# Patient Record
Sex: Female | Born: 1975 | Hispanic: No | Marital: Married | Smoking: Never smoker
Health system: Southern US, Community
[De-identification: ages and names within clinical notes are randomized; demographics above are authoritative.]

## PROBLEM LIST (undated history)

## (undated) ENCOUNTER — Inpatient Hospital Stay (HOSPITAL_COMMUNITY): Payer: Self-pay

## (undated) DIAGNOSIS — R12 Heartburn: Secondary | ICD-10-CM

## (undated) DIAGNOSIS — O3430 Maternal care for cervical incompetence, unspecified trimester: Secondary | ICD-10-CM

## (undated) DIAGNOSIS — Z9221 Personal history of antineoplastic chemotherapy: Secondary | ICD-10-CM

## (undated) DIAGNOSIS — R519 Headache, unspecified: Secondary | ICD-10-CM

## (undated) DIAGNOSIS — O26899 Other specified pregnancy related conditions, unspecified trimester: Secondary | ICD-10-CM

## (undated) DIAGNOSIS — Z923 Personal history of irradiation: Secondary | ICD-10-CM

## (undated) DIAGNOSIS — Z8489 Family history of other specified conditions: Secondary | ICD-10-CM

## (undated) DIAGNOSIS — O2340 Unspecified infection of urinary tract in pregnancy, unspecified trimester: Secondary | ICD-10-CM

## (undated) DIAGNOSIS — O021 Missed abortion: Secondary | ICD-10-CM

## (undated) DIAGNOSIS — E119 Type 2 diabetes mellitus without complications: Secondary | ICD-10-CM

## (undated) DIAGNOSIS — O24419 Gestational diabetes mellitus in pregnancy, unspecified control: Secondary | ICD-10-CM

## (undated) DIAGNOSIS — R51 Headache: Secondary | ICD-10-CM

## (undated) DIAGNOSIS — Z808 Family history of malignant neoplasm of other organs or systems: Secondary | ICD-10-CM

## (undated) HISTORY — DX: Family history of other specified conditions: Z84.89

## (undated) HISTORY — PX: TISSUE EXPANDER REMOVAL: SHX2531

## (undated) HISTORY — DX: Family history of malignant neoplasm of other organs or systems: Z80.8

---

## 1999-12-11 HISTORY — PX: WISDOM TOOTH EXTRACTION: SHX21

## 2004-12-10 HISTORY — PX: APPENDECTOMY: SHX54

## 2010-07-24 ENCOUNTER — Ambulatory Visit (HOSPITAL_COMMUNITY): Admission: RE | Admit: 2010-07-24 | Discharge: 2010-07-24 | Payer: Self-pay | Admitting: Obstetrics & Gynecology

## 2010-08-01 ENCOUNTER — Inpatient Hospital Stay (HOSPITAL_COMMUNITY): Admission: AD | Admit: 2010-08-01 | Discharge: 2010-08-05 | Payer: Self-pay | Admitting: Obstetrics and Gynecology

## 2010-08-05 ENCOUNTER — Encounter (INDEPENDENT_AMBULATORY_CARE_PROVIDER_SITE_OTHER): Payer: Self-pay | Admitting: Obstetrics & Gynecology

## 2010-12-31 ENCOUNTER — Encounter: Payer: Self-pay | Admitting: Obstetrics & Gynecology

## 2011-01-25 ENCOUNTER — Other Ambulatory Visit: Payer: Self-pay | Admitting: Obstetrics & Gynecology

## 2011-02-08 HISTORY — PX: CERVICAL CERCLAGE: SHX1329

## 2011-02-15 ENCOUNTER — Ambulatory Visit (HOSPITAL_COMMUNITY)
Admission: RE | Admit: 2011-02-15 | Discharge: 2011-02-15 | Disposition: A | Discharge status: Home / Self Care | Payer: 59 | Source: Ambulatory Visit | Attending: Obstetrics & Gynecology | Admitting: Obstetrics & Gynecology

## 2011-02-15 DIAGNOSIS — O3430 Maternal care for cervical incompetence, unspecified trimester: Secondary | ICD-10-CM | POA: Insufficient documentation

## 2011-02-15 LAB — CBC
MCH: 27.4 pg (ref 26.0–34.0)
Platelets: 211 10*3/uL (ref 150–400)
RDW: 15 % (ref 11.5–15.5)
WBC: 7 10*3/uL (ref 4.0–10.5)

## 2011-02-15 LAB — TYPE AND SCREEN
ABO/RH(D): B POS
Antibody Screen: NEGATIVE

## 2011-02-21 NOTE — Op Note (Signed)
  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  PROCEDURE:  Modified McDonald cervical cerclage.  SURGEON:  Genia Del, MD  No assistant.  ANESTHESIOLOGIST:  Brayton Caves, MD  DESCRIPTION OF PROCEDURE:  Under spinal anesthesia, the patient is in lithotomy position.  She is prepped with Betadine on the suprapubic, vulvar, and vaginal areas.  The bladder is catheterized and the patient is draped as usual.  The vaginal exam reveals a closed, long, firm cervix.  Vaginally, it measures 2 cm in length, but by ultrasound, it was at 3.7 cm.  No vaginal bleeding, no abnormal vaginal discharge.  We put the weighted speculum in place in the vagina.  We used a retractor anteriorly and grasped the cervix with a fenestrated open clamp.  We started at 12 o'clock with a Mersilene band.  We go anticlockwise at the junction of the cervix and vagina staying superficially as possible.  We go in and out about 6 times to go all around the cervix.  We attach the knot at 12 o'clock and passed a Prolene stitch on the Mersilene for easier removal hopefully at 37 weeks.  We attached the Mersilene band with 4 flat knots.  Hemostasis is adequate.  All instruments are removed.  The estimated blood loss was minimal.  The patient received Ancef 1 gram IV before starting the procedure.  No complications occurred, and she was brought to recovery room in good stable  status. She will receive Endocet for 24 hours after the procedure.     Genia Del, M.D.     ML/MEDQ  D:  02/15/2011  T:  02/15/2011  Job:  [id number]  Electronically Signed by Genia Del M.D. on 02/21/2011 04:54:09 PM

## 2011-02-23 LAB — URINALYSIS, MICROSCOPIC ONLY
Bilirubin Urine: NEGATIVE
Glucose, UA: NEGATIVE mg/dL
Specific Gravity, Urine: 1.01 (ref 1.005–1.030)
pH: 7 (ref 5.0–8.0)

## 2011-02-23 LAB — URINALYSIS, ROUTINE W REFLEX MICROSCOPIC
Glucose, UA: NEGATIVE mg/dL
Ketones, ur: 15 mg/dL — AB
Nitrite: NEGATIVE

## 2011-02-23 LAB — CBC
HCT: 29.6 % — ABNORMAL LOW (ref 36.0–46.0)
Hemoglobin: 10.4 g/dL — ABNORMAL LOW (ref 12.0–15.0)
Hemoglobin: 10.8 g/dL — ABNORMAL LOW (ref 12.0–15.0)
MCH: 24.5 pg — ABNORMAL LOW (ref 26.0–34.0)
MCH: 24.9 pg — ABNORMAL LOW (ref 26.0–34.0)
MCH: 25 pg — ABNORMAL LOW (ref 26.0–34.0)
MCHC: 33.7 g/dL (ref 30.0–36.0)
MCHC: 33.7 g/dL (ref 30.0–36.0)
MCHC: 33.9 g/dL (ref 30.0–36.0)
MCV: 73.8 fL — ABNORMAL LOW (ref 78.0–100.0)
Platelets: 226 10*3/uL (ref 150–400)
Platelets: 267 10*3/uL (ref 150–400)
RBC: 4.41 MIL/uL (ref 3.87–5.11)
RBC: 4.45 MIL/uL (ref 3.87–5.11)
RDW: 16.5 % — ABNORMAL HIGH (ref 11.5–15.5)
RDW: 16.8 % — ABNORMAL HIGH (ref 11.5–15.5)
RDW: 16.9 % — ABNORMAL HIGH (ref 11.5–15.5)
WBC: 12.7 10*3/uL — ABNORMAL HIGH (ref 4.0–10.5)
WBC: 8.7 10*3/uL (ref 4.0–10.5)
WBC: 9.4 10*3/uL (ref 4.0–10.5)

## 2011-02-23 LAB — GLUCOSE, CAPILLARY
Glucose-Capillary: 104 mg/dL — ABNORMAL HIGH (ref 70–99)
Glucose-Capillary: 116 mg/dL — ABNORMAL HIGH (ref 70–99)
Glucose-Capillary: 117 mg/dL — ABNORMAL HIGH (ref 70–99)
Glucose-Capillary: 172 mg/dL — ABNORMAL HIGH (ref 70–99)
Glucose-Capillary: 86 mg/dL (ref 70–99)
Glucose-Capillary: 91 mg/dL (ref 70–99)
Glucose-Capillary: 97 mg/dL (ref 70–99)
Glucose-Capillary: 99 mg/dL (ref 70–99)

## 2011-02-23 LAB — ABO/RH: ABO/RH(D): B POS

## 2011-02-23 LAB — TYPE AND SCREEN
ABO/RH(D): B POS
Antibody Screen: NEGATIVE

## 2011-02-23 LAB — URINE CULTURE: Special Requests: POSITIVE

## 2011-02-23 LAB — DIFFERENTIAL
Basophils Absolute: 0.1 10*3/uL (ref 0.0–0.1)
Eosinophils Absolute: 0 10*3/uL (ref 0.0–0.7)
Lymphocytes Relative: 14 % (ref 12–46)
Lymphs Abs: 1.7 10*3/uL (ref 0.7–4.0)
Neutrophils Relative %: 78 % — ABNORMAL HIGH (ref 43–77)

## 2011-02-28 ENCOUNTER — Inpatient Hospital Stay (HOSPITAL_COMMUNITY)
Admission: AD | Admit: 2011-02-28 | Discharge: 2011-02-28 | Disposition: A | Discharge status: Home / Self Care | Payer: 59 | Source: Ambulatory Visit | Attending: Obstetrics & Gynecology | Admitting: Obstetrics & Gynecology

## 2011-02-28 DIAGNOSIS — E860 Dehydration: Secondary | ICD-10-CM | POA: Insufficient documentation

## 2011-02-28 DIAGNOSIS — O99891 Other specified diseases and conditions complicating pregnancy: Secondary | ICD-10-CM | POA: Insufficient documentation

## 2011-02-28 DIAGNOSIS — G43909 Migraine, unspecified, not intractable, without status migrainosus: Secondary | ICD-10-CM | POA: Insufficient documentation

## 2011-02-28 DIAGNOSIS — O211 Hyperemesis gravidarum with metabolic disturbance: Secondary | ICD-10-CM | POA: Insufficient documentation

## 2011-02-28 LAB — COMPREHENSIVE METABOLIC PANEL
ALT: 11 U/L (ref 0–35)
AST: 20 U/L (ref 0–37)
Albumin: 3.8 g/dL (ref 3.5–5.2)
Alkaline Phosphatase: 26 U/L — ABNORMAL LOW (ref 39–117)
CO2: 26 mEq/L (ref 19–32)
Chloride: 103 mEq/L (ref 96–112)
Creatinine, Ser: 0.55 mg/dL (ref 0.4–1.2)
GFR calc Af Amer: >60 mL/min (ref >60)
GFR calc non Af Amer: >60 mL/min (ref >60)
Potassium: 3.7 mEq/L (ref 3.5–5.1)
Sodium: 138 mEq/L (ref 135–145)
Total Bilirubin: 0.4 mg/dL (ref 0.3–1.2)

## 2011-02-28 LAB — URINALYSIS, ROUTINE W REFLEX MICROSCOPIC
Glucose, UA: NEGATIVE mg/dL
Hgb urine dipstick: NEGATIVE
Protein, ur: NEGATIVE mg/dL
pH: 6.5 (ref 5.0–8.0)

## 2011-04-18 ENCOUNTER — Inpatient Hospital Stay (HOSPITAL_COMMUNITY)
Admission: AD | Admit: 2011-04-18 | Discharge: 2011-04-27 | DRG: 774 | Disposition: A | Discharge status: Home / Self Care | Payer: 59 | Source: Ambulatory Visit | Attending: Obstetrics & Gynecology | Admitting: Obstetrics & Gynecology

## 2011-04-18 DIAGNOSIS — E119 Type 2 diabetes mellitus without complications: Secondary | ICD-10-CM | POA: Diagnosis present

## 2011-04-18 DIAGNOSIS — O364XX0 Maternal care for intrauterine death, not applicable or unspecified: Secondary | ICD-10-CM | POA: Diagnosis not present

## 2011-04-18 DIAGNOSIS — O3430 Maternal care for cervical incompetence, unspecified trimester: Principal | ICD-10-CM | POA: Diagnosis present

## 2011-04-18 DIAGNOSIS — O2432 Unspecified pre-existing diabetes mellitus in childbirth: Secondary | ICD-10-CM | POA: Diagnosis present

## 2011-04-18 DIAGNOSIS — O4290 Premature rupture of membranes, unspecified as to length of time between rupture and onset of labor, unspecified weeks of gestation: Secondary | ICD-10-CM | POA: Diagnosis present

## 2011-04-19 LAB — GLUCOSE, CAPILLARY
Glucose-Capillary: 90 mg/dL (ref 70–99)
Glucose-Capillary: 131 mg/dL — ABNORMAL HIGH (ref 70–99)

## 2011-04-20 LAB — GLUCOSE, CAPILLARY
Glucose-Capillary: 123 mg/dL — ABNORMAL HIGH (ref 70–99)
Glucose-Capillary: 129 mg/dL — ABNORMAL HIGH (ref 70–99)

## 2011-04-20 LAB — URINE CULTURE

## 2011-04-21 LAB — GLUCOSE, CAPILLARY
Glucose-Capillary: 89 mg/dL (ref 70–99)
Glucose-Capillary: 129 mg/dL — ABNORMAL HIGH (ref 70–99)

## 2011-04-22 ENCOUNTER — Inpatient Hospital Stay (HOSPITAL_COMMUNITY): Payer: 59

## 2011-04-22 LAB — GLUCOSE, CAPILLARY
Glucose-Capillary: 98 mg/dL (ref 70–99)
Glucose-Capillary: 131 mg/dL — ABNORMAL HIGH (ref 70–99)

## 2011-04-23 ENCOUNTER — Ambulatory Visit (HOSPITAL_COMMUNITY): DRG: 781 | Payer: 59 | Attending: Obstetrics & Gynecology

## 2011-04-23 LAB — GLUCOSE, CAPILLARY
Glucose-Capillary: 101 mg/dL — ABNORMAL HIGH (ref 70–99)
Glucose-Capillary: 130 mg/dL — ABNORMAL HIGH (ref 70–99)
Glucose-Capillary: 125 mg/dL — ABNORMAL HIGH (ref 70–99)

## 2011-04-24 LAB — GLUCOSE, CAPILLARY
Glucose-Capillary: 107 mg/dL — ABNORMAL HIGH (ref 70–99)
Glucose-Capillary: 164 mg/dL — ABNORMAL HIGH (ref 70–99)

## 2011-04-25 LAB — GLUCOSE, CAPILLARY
Glucose-Capillary: 119 mg/dL — ABNORMAL HIGH (ref 70–99)
Glucose-Capillary: 177 mg/dL — ABNORMAL HIGH (ref 70–99)

## 2011-04-26 LAB — GLUCOSE, CAPILLARY
Glucose-Capillary: 118 mg/dL — ABNORMAL HIGH (ref 70–99)
Glucose-Capillary: 211 mg/dL — ABNORMAL HIGH (ref 70–99)

## 2011-04-27 ENCOUNTER — Other Ambulatory Visit: Payer: Self-pay | Admitting: Obstetrics & Gynecology

## 2011-04-27 ENCOUNTER — Inpatient Hospital Stay (HOSPITAL_COMMUNITY): Payer: 59

## 2011-04-27 LAB — DIFFERENTIAL
Basophils Absolute: 0 K/uL (ref 0.0–0.1)
Basophils Relative: 0 % (ref 0–1)
Eosinophils Absolute: 0.1 K/uL (ref 0.0–0.7)
Eosinophils Relative: 1 % (ref 0–5)
Lymphocytes Relative: 13 % (ref 12–46)
Lymphs Abs: 1.6 K/uL (ref 0.7–4.0)
Monocytes Absolute: 0.7 K/uL (ref 0.1–1.0)
Monocytes Relative: 6 % (ref 3–12)
Neutro Abs: 9.8 K/uL — ABNORMAL HIGH (ref 1.7–7.7)
Neutrophils Relative %: 80 % — ABNORMAL HIGH (ref 43–77)

## 2011-04-27 LAB — CBC
HCT: 35.9 % — ABNORMAL LOW (ref 36.0–46.0)
HCT: 30.8 % — ABNORMAL LOW (ref 36.0–46.0)
Hemoglobin: 12.3 g/dL (ref 12.0–15.0)
Hemoglobin: 10.4 g/dL — ABNORMAL LOW (ref 12.0–15.0)
MCH: 27.6 pg (ref 26.0–34.0)
MCH: 27.1 pg (ref 26.0–34.0)
MCHC: 34.3 g/dL (ref 30.0–36.0)
MCHC: 33.8 g/dL (ref 30.0–36.0)
MCV: 80.7 fL (ref 78.0–100.0)
MCV: 80.2 fL (ref 78.0–100.0)
Platelets: 190 K/uL (ref 150–400)
Platelets: 170 K/uL (ref 150–400)
RBC: 4.45 MIL/uL (ref 3.87–5.11)
RBC: 3.84 MIL/uL — ABNORMAL LOW (ref 3.87–5.11)
RDW: 13.7 % (ref 11.5–15.5)
RDW: 13.7 % (ref 11.5–15.5)
WBC: 12.2 K/uL — ABNORMAL HIGH (ref 4.0–10.5)
WBC: 12.1 K/uL — ABNORMAL HIGH (ref 4.0–10.5)

## 2011-04-27 LAB — PROTIME-INR
INR: 1 (ref 0.00–1.49)
Prothrombin Time: 13.4 s (ref 11.6–15.2)

## 2011-04-27 LAB — APTT: aPTT: 29 s (ref 24–37)

## 2011-05-11 DEATH — deceased

## 2011-10-18 ENCOUNTER — Ambulatory Visit: Payer: 59 | Admitting: Psychology

## 2011-10-23 ENCOUNTER — Ambulatory Visit (INDEPENDENT_AMBULATORY_CARE_PROVIDER_SITE_OTHER): Payer: 59 | Admitting: Psychology

## 2011-10-23 DIAGNOSIS — F4323 Adjustment disorder with mixed anxiety and depressed mood: Secondary | ICD-10-CM

## 2011-11-08 ENCOUNTER — Ambulatory Visit (INDEPENDENT_AMBULATORY_CARE_PROVIDER_SITE_OTHER): Payer: 59 | Admitting: Psychology

## 2011-11-08 DIAGNOSIS — F331 Major depressive disorder, recurrent, moderate: Secondary | ICD-10-CM

## 2011-12-20 ENCOUNTER — Ambulatory Visit: Payer: Self-pay | Admitting: Psychology

## 2013-02-20 ENCOUNTER — Other Ambulatory Visit (HOSPITAL_COMMUNITY): Payer: Self-pay | Admitting: Obstetrics and Gynecology

## 2013-02-27 ENCOUNTER — Ambulatory Visit (HOSPITAL_COMMUNITY)
Admission: RE | Admit: 2013-02-27 | Discharge: 2013-02-27 | Disposition: A | Discharge status: Home / Self Care | Payer: 59 | Source: Ambulatory Visit | Attending: Obstetrics and Gynecology | Admitting: Obstetrics and Gynecology

## 2013-02-27 DIAGNOSIS — N979 Female infertility, unspecified: Secondary | ICD-10-CM | POA: Insufficient documentation

## 2013-02-27 MED ORDER — IOHEXOL 300 MG/ML  SOLN
20.0000 mL | Freq: Once | INTRAMUSCULAR | Status: AC | PRN
  Administered 2013-02-27: 30 mL

## 2013-05-27 ENCOUNTER — Ambulatory Visit: Payer: 59 | Attending: Physical Medicine and Rehabilitation

## 2013-05-27 DIAGNOSIS — M545 Low back pain, unspecified: Secondary | ICD-10-CM | POA: Insufficient documentation

## 2013-05-27 DIAGNOSIS — IMO0001 Reserved for inherently not codable concepts without codable children: Secondary | ICD-10-CM | POA: Insufficient documentation

## 2013-06-01 ENCOUNTER — Ambulatory Visit: Payer: 59 | Admitting: Physical Therapy

## 2013-06-05 ENCOUNTER — Ambulatory Visit: Payer: 59

## 2013-06-08 ENCOUNTER — Ambulatory Visit: Payer: 59

## 2013-06-19 ENCOUNTER — Ambulatory Visit: Payer: 59 | Attending: Physical Medicine and Rehabilitation

## 2013-06-19 DIAGNOSIS — IMO0001 Reserved for inherently not codable concepts without codable children: Secondary | ICD-10-CM | POA: Insufficient documentation

## 2013-06-19 DIAGNOSIS — M545 Low back pain, unspecified: Secondary | ICD-10-CM | POA: Insufficient documentation

## 2013-06-23 ENCOUNTER — Ambulatory Visit: Payer: 59

## 2013-06-25 ENCOUNTER — Ambulatory Visit: Payer: 59

## 2013-06-30 ENCOUNTER — Ambulatory Visit: Payer: 59

## 2013-07-02 ENCOUNTER — Ambulatory Visit: Payer: 59

## 2013-07-06 ENCOUNTER — Ambulatory Visit: Payer: 59 | Admitting: Physical Therapy

## 2013-07-09 ENCOUNTER — Ambulatory Visit: Payer: 59 | Admitting: Rehabilitation

## 2013-07-15 ENCOUNTER — Ambulatory Visit: Payer: 59 | Attending: Physical Medicine and Rehabilitation | Admitting: Physical Therapy

## 2013-07-15 DIAGNOSIS — IMO0001 Reserved for inherently not codable concepts without codable children: Secondary | ICD-10-CM | POA: Insufficient documentation

## 2013-07-15 DIAGNOSIS — M545 Low back pain, unspecified: Secondary | ICD-10-CM | POA: Insufficient documentation

## 2013-07-17 ENCOUNTER — Ambulatory Visit: Payer: 59

## 2013-07-22 ENCOUNTER — Ambulatory Visit: Payer: 59 | Admitting: Physical Therapy

## 2013-07-27 ENCOUNTER — Ambulatory Visit: Payer: 59 | Admitting: Physical Therapy

## 2013-07-29 ENCOUNTER — Encounter: Payer: Self-pay | Admitting: Physical Therapy

## 2013-07-29 ENCOUNTER — Ambulatory Visit: Payer: 59 | Admitting: Rehabilitation

## 2013-08-05 ENCOUNTER — Ambulatory Visit: Payer: 59 | Admitting: Physical Therapy

## 2015-03-11 DIAGNOSIS — O2340 Unspecified infection of urinary tract in pregnancy, unspecified trimester: Secondary | ICD-10-CM

## 2015-03-11 HISTORY — DX: Unspecified infection of urinary tract in pregnancy, unspecified trimester: O23.40

## 2015-03-23 LAB — OB RESULTS CONSOLE HEPATITIS B SURFACE ANTIGEN: Hepatitis B Surface Ag: NEGATIVE

## 2015-03-23 LAB — OB RESULTS CONSOLE ABO/RH: RH Type: POSITIVE

## 2015-03-23 LAB — OB RESULTS CONSOLE RPR: RPR: NONREACTIVE

## 2015-03-23 LAB — OB RESULTS CONSOLE HIV ANTIBODY (ROUTINE TESTING): HIV: NONREACTIVE

## 2015-03-23 LAB — OB RESULTS CONSOLE RUBELLA ANTIBODY, IGM: Rubella: IMMUNE

## 2015-04-10 DIAGNOSIS — O3430 Maternal care for cervical incompetence, unspecified trimester: Secondary | ICD-10-CM

## 2015-04-10 HISTORY — DX: Maternal care for cervical incompetence, unspecified trimester: O34.30

## 2015-04-12 NOTE — Patient Instructions (Addendum)
   Your procedure is scheduled on:  Friday, May 6  Enter through the Main Entrance of [facility] at: 9 AM Pick up the phone at the desk and dial [phone] and inform us of your arrival.  Please call this number if you have any problems the morning of surgery: [phone]  Remember: Do not eat or drink  after midnight:Thursday Take these medicines the morning of surgery with a SIP OF WATER:  Pepcid.  Patient instructed to withhold Metformin night dose tonight and Friday morning dose.  We will check you sugar upon arrival to Short Stay Dept..    Do not wear jewelry, make-up, or FINGER nail polish No metal in your hair or on your body. Do not wear lotions, powders, perfumes.  You may wear deodorant.  Do not bring valuables to the hospital. Contacts, dentures or bridgework may not be worn into surgery.  Patients discharged on the day of surgery will not be allowed to drive home.  Home with husband Allison Reed cell [phone] or brother Allison Reed cell  [phone].

## 2015-04-13 NOTE — Anesthesia Preprocedure Evaluation (Addendum)
Anesthesia Evaluation  Patient identified by MRN, date of birth, ID band Patient awake    Reviewed: Allergy & Precautions, NPO status , Patient's Chart, lab work & pertinent test results, reviewed documented beta blocker date and time   Airway Mallampati: II   Neck ROM: Full    Dental  (+) Teeth Intact, Dental Advisory Given   Pulmonary  breath sounds clear to auscultation        Cardiovascular negative cardio ROS  Rhythm:Regular     Neuro/Psych  Headaches,    GI/Hepatic   Endo/Other  diabetes, Oral Hypoglycemic Agents  Renal/GU      Musculoskeletal   Abdominal (+)  Abdomen: soft.    Peds  Hematology   Anesthesia Other Findings   Reproductive/Obstetrics Hx multiple pregnancy loss                            Anesthesia Physical Anesthesia Plan  ASA: II  Anesthesia Plan: General   Post-op Pain Management:    Induction: Intravenous  Airway Management Planned: Oral ETT  Additional Equipment:   Intra-op Plan:   Post-operative Plan: Extubation in OR  Informed Consent: I have reviewed the patients History and Physical, chart, labs and discussed the procedure including the risks, benefits and alternatives for the proposed anesthesia with the patient or authorized representative who has indicated his/her understanding and acceptance.     Plan Discussed with:   Anesthesia Plan Comments:         Anesthesia Quick Evaluation

## 2015-04-14 ENCOUNTER — Encounter (HOSPITAL_COMMUNITY): Payer: Self-pay

## 2015-04-14 ENCOUNTER — Encounter (HOSPITAL_COMMUNITY)
Admission: RE | Admit: 2015-04-14 | Discharge: 2015-04-14 | Disposition: A | Discharge status: Home / Self Care | Payer: 59 | Source: Ambulatory Visit | Attending: Obstetrics and Gynecology | Admitting: Obstetrics and Gynecology

## 2015-04-14 DIAGNOSIS — O24311 Unspecified pre-existing diabetes mellitus in pregnancy, first trimester: Secondary | ICD-10-CM | POA: Diagnosis not present

## 2015-04-14 DIAGNOSIS — O3431 Maternal care for cervical incompetence, first trimester: Secondary | ICD-10-CM | POA: Diagnosis present

## 2015-04-14 DIAGNOSIS — O99311 Alcohol use complicating pregnancy, first trimester: Secondary | ICD-10-CM | POA: Diagnosis not present

## 2015-04-14 DIAGNOSIS — Z3A11 11 weeks gestation of pregnancy: Secondary | ICD-10-CM | POA: Diagnosis not present

## 2015-04-14 HISTORY — DX: Type 2 diabetes mellitus without complications: E11.9

## 2015-04-14 HISTORY — DX: Other specified pregnancy related conditions, unspecified trimester: O26.899

## 2015-04-14 HISTORY — DX: Headache: R51

## 2015-04-14 HISTORY — DX: Other specified pregnancy related conditions, unspecified trimester: R12

## 2015-04-14 HISTORY — DX: Unspecified infection of urinary tract in pregnancy, unspecified trimester: O23.40

## 2015-04-14 HISTORY — DX: Missed abortion: O02.1

## 2015-04-14 HISTORY — DX: Headache, unspecified: R51.9

## 2015-04-14 HISTORY — DX: Maternal care for cervical incompetence, unspecified trimester: O34.30

## 2015-04-14 LAB — CBC
HCT: 37.8 % (ref 36.0–46.0)
HEMOGLOBIN: 13.1 g/dL (ref 12.0–15.0)
MCH: 27.9 pg (ref 26.0–34.0)
MCHC: 34.7 g/dL (ref 30.0–36.0)
MCV: 80.6 fL (ref 78.0–100.0)
PLATELETS: 214 10*3/uL (ref 150–400)
RBC: 4.69 MIL/uL (ref 3.87–5.11)
RDW: 13.3 % (ref 11.5–15.5)
WBC: 8.3 10*3/uL (ref 4.0–10.5)

## 2015-04-14 LAB — BASIC METABOLIC PANEL
Anion gap: 9 (ref 5–15)
CALCIUM: 9.5 mg/dL (ref 8.9–10.3)
CHLORIDE: 102 mmol/L (ref 101–111)
CO2: 23 mmol/L (ref 22–32)
Creatinine, Ser: 0.53 mg/dL (ref 0.44–1.00)
GFR calc Af Amer: >60 mL/min (ref >60)
Glucose, Bld: 183 mg/dL — ABNORMAL HIGH (ref 70–99)
Potassium: 3.8 mmol/L (ref 3.5–5.1)
Sodium: 134 mmol/L — ABNORMAL LOW (ref 135–145)

## 2015-04-15 ENCOUNTER — Ambulatory Visit (HOSPITAL_COMMUNITY)
Admission: RE | Admit: 2015-04-15 | Discharge: 2015-04-15 | Disposition: A | Discharge status: Home / Self Care | Payer: 59 | Source: Ambulatory Visit | Attending: Obstetrics and Gynecology | Admitting: Obstetrics and Gynecology

## 2015-04-15 ENCOUNTER — Ambulatory Visit (HOSPITAL_COMMUNITY): Payer: 59 | Admitting: Anesthesiology

## 2015-04-15 ENCOUNTER — Encounter (HOSPITAL_COMMUNITY)
Admission: RE | Disposition: A | Discharge status: Home / Self Care | Payer: Self-pay | Source: Ambulatory Visit | Attending: Obstetrics and Gynecology

## 2015-04-15 ENCOUNTER — Other Ambulatory Visit (HOSPITAL_COMMUNITY): Payer: Self-pay | Admitting: Obstetrics and Gynecology

## 2015-04-15 ENCOUNTER — Encounter (HOSPITAL_COMMUNITY): Payer: Self-pay | Admitting: Anesthesiology

## 2015-04-15 DIAGNOSIS — O3431 Maternal care for cervical incompetence, first trimester: Secondary | ICD-10-CM | POA: Insufficient documentation

## 2015-04-15 DIAGNOSIS — O99311 Alcohol use complicating pregnancy, first trimester: Secondary | ICD-10-CM | POA: Insufficient documentation

## 2015-04-15 DIAGNOSIS — O26879 Cervical shortening, unspecified trimester: Secondary | ICD-10-CM

## 2015-04-15 DIAGNOSIS — O24311 Unspecified pre-existing diabetes mellitus in pregnancy, first trimester: Secondary | ICD-10-CM | POA: Insufficient documentation

## 2015-04-15 DIAGNOSIS — Z3A11 11 weeks gestation of pregnancy: Secondary | ICD-10-CM | POA: Insufficient documentation

## 2015-04-15 HISTORY — PX: CERCLAGE LAPAROSCOPIC ABDOMINAL: SHX5769

## 2015-04-15 LAB — GLUCOSE, CAPILLARY
Glucose-Capillary: 103 mg/dL — ABNORMAL HIGH (ref 70–99)
Glucose-Capillary: 165 mg/dL — ABNORMAL HIGH (ref 70–99)

## 2015-04-15 SURGERY — CERCLAGE, CERVIX, LAPAROSCOPIC
Anesthesia: General | Site: Abdomen

## 2015-04-15 MED ORDER — INDOMETHACIN 25 MG PO CAPS: 25.0000 mg | ORAL_CAPSULE | Freq: Three times a day (TID) | ORAL | Status: DC

## 2015-04-15 MED ORDER — NEOSTIGMINE METHYLSULFATE 10 MG/10ML IV SOLN
INTRAVENOUS | Status: AC
  Filled 2015-04-15: qty 1

## 2015-04-15 MED ORDER — NEOSTIGMINE METHYLSULFATE 10 MG/10ML IV SOLN
INTRAVENOUS | Status: DC | PRN
  Administered 2015-04-15: 4 mg via INTRAVENOUS

## 2015-04-15 MED ORDER — LIDOCAINE HCL (CARDIAC) 20 MG/ML IV SOLN
INTRAVENOUS | Status: AC
  Filled 2015-04-15: qty 5

## 2015-04-15 MED ORDER — OXYCODONE-ACETAMINOPHEN 7.5-325 MG PO TABS: 1.0000 | ORAL_TABLET | ORAL | Status: DC | PRN

## 2015-04-15 MED ORDER — CEFAZOLIN SODIUM-DEXTROSE 2-3 GM-% IV SOLR
2.0000 g | INTRAVENOUS | Status: AC
  Administered 2015-04-15: 2 g via INTRAVENOUS

## 2015-04-15 MED ORDER — FENTANYL CITRATE (PF) 100 MCG/2ML IJ SOLN
INTRAMUSCULAR | Status: DC | PRN
  Administered 2015-04-15 (×5): 50 ug via INTRAVENOUS
  Administered 2015-04-15: 100 ug via INTRAVENOUS

## 2015-04-15 MED ORDER — BUPIVACAINE HCL 0.25 % IJ SOLN
INTRAMUSCULAR | Status: DC | PRN
  Administered 2015-04-15: 20 mL

## 2015-04-15 MED ORDER — LACTATED RINGERS IR SOLN
Status: DC | PRN
  Administered 2015-04-15: 3000 mL

## 2015-04-15 MED ORDER — CEFAZOLIN SODIUM-DEXTROSE 2-3 GM-% IV SOLR
INTRAVENOUS | Status: AC
  Filled 2015-04-15: qty 50

## 2015-04-15 MED ORDER — FENTANYL CITRATE (PF) 100 MCG/2ML IJ SOLN
INTRAMUSCULAR | Status: AC
  Filled 2015-04-15: qty 2

## 2015-04-15 MED ORDER — INDOMETHACIN 50 MG RE SUPP
50.0000 mg | Freq: Once | RECTAL | Status: AC
  Administered 2015-04-15: 50 mg via RECTAL
  Filled 2015-04-15: qty 1

## 2015-04-15 MED ORDER — SUCCINYLCHOLINE CHLORIDE 20 MG/ML IJ SOLN
INTRAMUSCULAR | Status: DC | PRN
  Administered 2015-04-15: 140 mg via INTRAVENOUS

## 2015-04-15 MED ORDER — BUPIVACAINE HCL (PF) 0.25 % IJ SOLN
INTRAMUSCULAR | Status: AC
  Filled 2015-04-15: qty 30

## 2015-04-15 MED ORDER — PROPOFOL 10 MG/ML IV BOLUS
INTRAVENOUS | Status: AC
  Filled 2015-04-15: qty 20

## 2015-04-15 MED ORDER — LACTATED RINGERS IV SOLN
INTRAVENOUS | Status: DC
  Administered 2015-04-15 (×3): via INTRAVENOUS

## 2015-04-15 MED ORDER — PROPOFOL 10 MG/ML IV BOLUS
INTRAVENOUS | Status: DC | PRN
  Administered 2015-04-15: 160 mg via INTRAVENOUS

## 2015-04-15 MED ORDER — GLYCOPYRROLATE 0.2 MG/ML IJ SOLN
INTRAMUSCULAR | Status: AC
  Filled 2015-04-15: qty 3

## 2015-04-15 MED ORDER — ROCURONIUM BROMIDE 100 MG/10ML IV SOLN
INTRAVENOUS | Status: DC | PRN
  Administered 2015-04-15 (×3): 10 mg via INTRAVENOUS
  Administered 2015-04-15: 30 mg via INTRAVENOUS

## 2015-04-15 MED ORDER — SCOPOLAMINE 1 MG/3DAYS TD PT72
MEDICATED_PATCH | TRANSDERMAL | Status: AC
  Filled 2015-04-15: qty 1

## 2015-04-15 MED ORDER — ROCURONIUM BROMIDE 100 MG/10ML IV SOLN
INTRAVENOUS | Status: AC
  Filled 2015-04-15: qty 1

## 2015-04-15 MED ORDER — LIDOCAINE HCL (CARDIAC) 20 MG/ML IV SOLN
INTRAVENOUS | Status: DC | PRN
  Administered 2015-04-15: 80 mg via INTRAVENOUS

## 2015-04-15 MED ORDER — FENTANYL CITRATE (PF) 250 MCG/5ML IJ SOLN
INTRAMUSCULAR | Status: AC
  Filled 2015-04-15: qty 5

## 2015-04-15 SURGICAL SUPPLY — 45 items
BENZOIN TINCTURE PRP APPL 2/3 (GAUZE/BANDAGES/DRESSINGS) ×3 IMPLANT
CABLE HIGH FREQUENCY MONO STRZ (ELECTRODE) ×3 IMPLANT
CATH ROBINSON RED A/P 16FR (CATHETERS) ×3 IMPLANT
CLOTH BEACON ORANGE TIMEOUT ST (SAFETY) ×3 IMPLANT
DEVICE TROCAR PUNCTURE CLOSURE (ENDOMECHANICALS) ×3 IMPLANT
DRSG COVADERM PLUS 2X2 (GAUZE/BANDAGES/DRESSINGS) ×6 IMPLANT
DRSG OPSITE POSTOP 3X4 (GAUZE/BANDAGES/DRESSINGS) ×3 IMPLANT
ELECT NEEDLE TIP 2.8 STRL (NEEDLE) IMPLANT
ELECT REM PT RETURN 9FT ADLT (ELECTROSURGICAL) ×3
ELECTRODE REM PT RTRN 9FT ADLT (ELECTROSURGICAL) ×1 IMPLANT
EVACUATOR SMOKE 8.L (FILTER) IMPLANT
GLOVE BIO SURGEON STRL SZ8 (GLOVE) ×3 IMPLANT
GLOVE BIOGEL PI IND STRL 8.5 (GLOVE) ×1 IMPLANT
GLOVE BIOGEL PI INDICATOR 8.5 (GLOVE) ×2
GOWN STRL REUS W/TWL LRG LVL3 (GOWN DISPOSABLE) ×6 IMPLANT
LIQUID BAND (GAUZE/BANDAGES/DRESSINGS) ×3 IMPLANT
PACK LAPAROSCOPY BASIN (CUSTOM PROCEDURE TRAY) ×3 IMPLANT
PAD POSITIONER PINK NONSTERILE (MISCELLANEOUS) ×3 IMPLANT
PENCIL BUTTON HOLSTER BLD 10FT (ELECTRODE) ×3 IMPLANT
RETRACTOR LAPSCP 12X46 CVD (ENDOMECHANICALS) ×2 IMPLANT
RTRCTR LAPSCP 12X46 CVD (ENDOMECHANICALS) ×6
SCALPEL HARMONIC ACE (MISCELLANEOUS) IMPLANT
SEALER TISSUE G2 CVD JAW 35 (ENDOMECHANICALS) IMPLANT
SEALER TISSUE G2 CVD JAW 45CM (ENDOMECHANICALS)
SEPRAFILM MEMBRANE 5X6 (MISCELLANEOUS) IMPLANT
SET IRRIG TUBING LAPAROSCOPIC (IRRIGATION / IRRIGATOR) ×3 IMPLANT
SHEARS HARMONIC ACE PLUS 36CM (ENDOMECHANICALS) ×3 IMPLANT
SLEEVE XCEL OPT CAN 5 100 (ENDOMECHANICALS) ×3 IMPLANT
SUT MERSILENE 5MM BP 1 12 (SUTURE) ×3 IMPLANT
SUT MNCRL AB 4-0 PS2 18 (SUTURE) ×3 IMPLANT
SUT PROLENE 0 CT 1 30 (SUTURE) IMPLANT
SUT SILK 2 0 FS (SUTURE) ×3 IMPLANT
SUT SILK 2 0 FSL 18 (SUTURE) ×6 IMPLANT
SUT VIC AB 2-0 UR6 27 (SUTURE) IMPLANT
SUT VICRYL 0 TIES 12 18 (SUTURE) IMPLANT
SYR 20CC LL (SYRINGE) ×6 IMPLANT
SYR 50ML LL SCALE MARK (SYRINGE) IMPLANT
SYR TOOMEY 50ML (SYRINGE) ×6 IMPLANT
TOWEL OR 17X24 6PK STRL BLUE (TOWEL DISPOSABLE) ×6 IMPLANT
TRAY FOLEY CATH SILVER 14FR (SET/KITS/TRAYS/PACK) ×3 IMPLANT
TROCAR OPTI TIP 5M 100M (ENDOMECHANICALS) ×3 IMPLANT
TROCAR XCEL 12X100 BLDLESS (ENDOMECHANICALS) ×3 IMPLANT
TROCAR XCEL DIL TIP R 11M (ENDOMECHANICALS) ×3 IMPLANT
WARMER LAPAROSCOPE (MISCELLANEOUS) ×3 IMPLANT
WATER STERILE IRR 1000ML POUR (IV SOLUTION) ×3 IMPLANT

## 2015-04-15 NOTE — Transfer of Care (Signed)
Immediate Anesthesia Transfer of Care Note  Patient: Allison Reed  Procedure(s) Performed: Procedure(s): LAPAROSCOPIC TRANSABDOMINAL Odell (N/A)  Patient Location: PACU  Anesthesia Type:General  Level of Consciousness: awake, oriented and patient cooperative  Airway & Oxygen Therapy: Patient Spontanous Breathing and Patient connected to nasal cannula oxygen  Post-op Assessment: Report given to RN and Post -op Vital signs reviewed and stable  Post vital signs: Reviewed and stable  Last Vitals:  Filed Vitals:   04/15/15 0904  BP: 107/74  Pulse: 85  Temp: 36.6 C  Resp: 18    Complications: No apparent anesthesia complications

## 2015-04-15 NOTE — Op Note (Signed)
OPERATIVE NOTE  Preoperative diagnosis: Cervical insufficiency, intrauterine pregnancy at 11 weeks Postoperative diagnosis: Cervical insufficiency, intrauterine pregnancy at 11 weeks Procedure: Laparoscopy, transabdominal cervical isthmic cerclage, intraoperative ultrasound guidance Anesthesia: Gen. Endotracheal Surgeon:Ayren Zumbro Kerin Perna, MD Assistant: Cala Bradford, MD Complications: None  Estimated blood loss: <20 cc Findings: On exam under anesthesia the cervix was closed. On laparoscopy the liver edges and gallbladder were normal. The uterus was gravid both tubes appeared normal, ovaries appeared polycystic. Intraoperative ultrasound transabdominally and transvaginally showed a singleton intrauterine pregnancy at length consistent with 11 weeks. There was fetal cardiac activity and movements.  Description of the procedure: The patient was placed in dorsal supine position and general endotracheal anesthesia was given. She was then placed in lithotomy position and the abdomen was prepped and draped inside manner. A Foley catheter was inserted into the bladder. An intraumbilical 5 mm vertical skin incision was made after preemptive anesthesia of all toes incisions with 0.25% bupivacaine. A Verress needle was inserted. A pneumoperitoneum was created with carbon dioxide. A 5 mm trocar and then a corresponding laparoscope with 30 angle was inserted and video laparoscopy was started.  Under direct visualization 2 more 5 mm lower quadrant incisions were made and corresponding trochars were placed. A fifth 12 mm trocar was placed in the left upper quadrant for the Endopaddle retractor.  Above findings were noted. A #5 Mersilene tape was prepared by cutting the needles off just at the swaged and and creating a loop of 2-0 silk at each end to allow carrying the end of the Mersilene tape through the tissue during the cerclage. This Mersilene tape was then dropped into the posterior cul-de-sac. The  patient was placed in Trendelenburg position and the uterus was gently pressed posteriorly and superiorly with the EndoPaddle retractor while the bladder flap was created with the electrosurgical needle, set at 48 W cutting current. After blunt and sharp dissection the cervico-isthmic junction could be well visualized.  Next a stab wound incision was made 1 cm from the midline immediately suprapubically and an Endo Close ligature carrier was passed through the abdominal wall and then through the right side of the cervico-isthmic junction. At this point of passing the tip of the Endo Close through the parametrium the uterus was hammocked on the EndoPaddle device and lifted anteriorly, allowing visualization of the posterior lower uterine segment. The tip of the Endo Close device was brought out posteriorly just above the medial insertion point of the right uterosacral ligament onto the uterus. one end of the Mersilene tape was grasped with the Endo Close device and brought out anteriorly. The same steps were repeated with a new Endo Close device on the left anterior aspect of the cervico-isthmic junction, immediately medial to the uterine blood vessels, and the other end of the Mersilene tape was grasped with the ligature carrier device and brought out anteriorly. Intraoperative transvaginal ultrasound guidance confirmed correct extra chorionic placement of the cerclage, as well as the fetal viability. When the cerclage suture was tied with a surgeon's knot followed by 4 additional knots under transvaginal ultrasound guidance again the correct placement was confirmed. The resulting cervical cerclage produced a cervical length of 4.4 cm. The AP and transverse diameters of the cerclage by ultrasound was 19 and 22 mm respectively. The ends of the Mersilene tape was trimmed off and removed from the pelvis. Good hemostasis was insured. The pelvis was copiously irrigated and aspirated. The gas was allowed to escape. A 0  Vicryl deep subcutaneous suture was placed  on the 12 mm incision. All skin incisions were approximated with 4-0 Monocryl in subcuticular stitches. The patient tolerated the procedure well and was transferred to recovery room in satisfactory condition.  Governor Specking

## 2015-04-15 NOTE — Anesthesia Postprocedure Evaluation (Signed)
  Anesthesia Post-op Note  Patient: Allison Reed  Procedure(s) Performed: Procedure(s): LAPAROSCOPIC TRANSABDOMINAL CERVCOISTHMIC CERCLAGE (N/A)  Patient Location: PACU  Anesthesia Type:General  Level of Consciousness: awake and alert   Airway and Oxygen Therapy: Patient Spontanous Breathing and Patient connected to nasal cannula oxygen  Post-op Pain: none  Post-op Assessment: Post-op Vital signs reviewed, Patient's Cardiovascular Status Stable and Patent Airway  Post-op Vital Signs: Reviewed and stable  Last Vitals:  Filed Vitals:   04/15/15 0904  BP: 107/74  Pulse: 85  Temp: 36.6 C  Resp: 18    Complications: No apparent anesthesia complications

## 2015-04-15 NOTE — H&P (Signed)
Allison Reed is a 39 y.o. female , originally referred to me by Dr. Dellis Filbert, for infertility. She has a history of spontaneous abortion at [redacted] wks gestation in 2011 due to incompetent cervix. She lost her next pregnancy at 19 weeks while she was on bedrest due to membranes sliding into the cervix. She had an elective transvaginal cerclage at 13 weeks. While she was on bedrest, she had rupture of amniotic membranes with rapid development of uterine contractions and suspected intra-amniotic infection. 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  Patient is currently 10wk, 2d gestation and desires trans-abdominal cerclage.   Pertinent Gynecological History: Menses: Currently Pregnant. Bleeding: Currently Pregnant. Contraception: Currently Pregnant.DES exposure: denies Blood transfusions: none Sexually transmitted diseases: no past history Previous GYN Procedures: TVOR  Last mammogram: normal Last pap: normal  OB History: G 5; 2 SAB; 2 Neonatal Deaths   Menstrual History: Menarche age: 39 No LMP recorded.    Past Medical History  Diagnosis Date  . Missed abortion     no surgery required  . Diabetes mellitus without complication     Type 2  . Heartburn in pregnancy   . Headache     otc med prn  . 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 UTI in pregnancy 03/2015    recent UTI, treatment completed                    Past Surgical History  Procedure Laterality Date  . Appendectomy  2006  . Cervical cerclage  02/2011    vaginal  . Wisdom tooth extraction  2001             No family history on file. No hereditary disease.  No cancer of breast, ovary, uterus. No cutaneous leiomyomatosis or renal cell carcinoma.  History   Social History  . Marital Status: Married    Spouse Name: N/A  . Number of Children: N/A  . Years of Education: N/A   Occupational History  . Not on file.   Social History Main Topics  . Smoking status: Never Smoker   .  Smokeless tobacco: Never Used  . Alcohol Use: Yes     Comment: social  . Drug Use: No  . Sexual Activity: Yes    Birth Control/ Protection: None     Comment: pregnant - approx 11wks 5/7 per patient on 04/14/15   Other Topics Concern  . Not on file   Social History Narrative  . No narrative on file    No Known Allergies  No current facility-administered medications on file prior to encounter.   No current outpatient prescriptions on file prior to encounter.     Review of Systems  Constitutional: Negative.   HENT: Negative.   Eyes: Negative.   Respiratory: Negative.   Cardiovascular: Negative.   Gastrointestinal: Negative.   Genitourinary: Negative.   Musculoskeletal: Negative.   Skin: Negative.   Neurological: Negative.   Endo/Heme/Allergies: Negative.   Psychiatric/Behavioral: Negative.      Physical Exam  There were no vitals taken for this visit. Constitutional: She is oriented to person, place, and time. She appears well-developed and well-nourished.  HENT:  Head: Normocephalic and atraumatic.  Nose: Nose normal.  Mouth/Throat: Oropharynx is clear and moist. No oropharyngeal exudate.  Eyes: Conjunctivae normal and EOM are normal. Pupils are equal, round, and reactive to light. No scleral icterus.  Neck: Normal range of motion. Neck supple. No tracheal deviation present. No thyromegaly  present.  Cardiovascular: Normal rate.   Respiratory: Effort normal and breath sounds normal.  GI: Soft. Bowel sounds are normal. She exhibits no distension and no mass. There is no tenderness.  Lymphadenopathy:    She has no cervical adenopathy.  Neurological: She is alert and oriented to person, place, and time. She has normal reflexes.  Skin: Skin is warm.  Psychiatric: She has a normal mood and affect. Her behavior is normal. Judgment and thought content normal.       Assessment/Plan:  Patient is currently 10wks, 2days gestation following a successful invitro  fertilization cycle. We discussed the indication for and different types of transabdominal cerclage via laparoscopy. I would prefer to perform this procedure at 10-11 weeks and be able to cinch the cerclage suture tightly, in order to eliminate the risk of membranes sliding into a somewhat patent cervical canal.

## 2015-04-15 NOTE — Anesthesia Procedure Notes (Signed)
Procedure Name: Intubation Date/Time: 04/15/2015 11:00 AM Performed by: Tobin Chad Pre-anesthesia Checklist: Patient identified, Timeout performed, Emergency Drugs available, Suction available and Patient being monitored Patient Re-evaluated:Patient Re-evaluated prior to inductionOxygen Delivery Method: Circle system utilized and Simple face mask Preoxygenation: Pre-oxygenation with 100% oxygen Intubation Type: IV induction and Inhalational induction Ventilation: Mask ventilation without difficulty Laryngoscope Size: Mac and 3 Grade View: Grade II Tube size: 7.0 mm Number of attempts: 1 Airway Equipment and Method: Rigid stylet Placement Confirmation: ETT inserted through vocal cords under direct vision,  breath sounds checked- equal and bilateral and positive ETCO2 Secured at: 22 cm Tube secured with: Tape Dental Injury: Teeth and Oropharynx as per pre-operative assessment

## 2015-04-15 NOTE — Discharge Instructions (Signed)

## 2015-04-18 ENCOUNTER — Encounter (HOSPITAL_COMMUNITY): Payer: Self-pay | Admitting: Obstetrics and Gynecology

## 2015-09-07 ENCOUNTER — Other Ambulatory Visit: Payer: Self-pay | Admitting: Obstetrics & Gynecology

## 2015-09-28 LAB — OB RESULTS CONSOLE GBS: GBS: NEGATIVE

## 2015-10-12 ENCOUNTER — Inpatient Hospital Stay (HOSPITAL_COMMUNITY)
Admission: AD | Admit: 2015-10-12 | Discharge: 2015-10-15 | DRG: 765 | Disposition: A | Discharge status: Home / Self Care | Payer: 59 | Source: Ambulatory Visit | Attending: Obstetrics & Gynecology | Admitting: Obstetrics & Gynecology

## 2015-10-12 ENCOUNTER — Inpatient Hospital Stay (HOSPITAL_COMMUNITY): Payer: 59 | Admitting: Anesthesiology

## 2015-10-12 ENCOUNTER — Encounter (HOSPITAL_COMMUNITY): Payer: Self-pay | Admitting: *Deleted

## 2015-10-12 ENCOUNTER — Encounter (HOSPITAL_COMMUNITY)
Admission: AD | Disposition: A | Discharge status: Home / Self Care | Payer: Self-pay | Source: Ambulatory Visit | Attending: Obstetrics & Gynecology

## 2015-10-12 DIAGNOSIS — Z3A37 37 weeks gestation of pregnancy: Secondary | ICD-10-CM | POA: Diagnosis not present

## 2015-10-12 DIAGNOSIS — O2412 Pre-existing diabetes mellitus, type 2, in childbirth: Secondary | ICD-10-CM | POA: Diagnosis present

## 2015-10-12 DIAGNOSIS — L239 Allergic contact dermatitis, unspecified cause: Secondary | ICD-10-CM | POA: Diagnosis not present

## 2015-10-12 DIAGNOSIS — Z9889 Other specified postprocedural states: Secondary | ICD-10-CM

## 2015-10-12 DIAGNOSIS — E119 Type 2 diabetes mellitus without complications: Secondary | ICD-10-CM | POA: Diagnosis present

## 2015-10-12 DIAGNOSIS — Z794 Long term (current) use of insulin: Secondary | ICD-10-CM | POA: Diagnosis not present

## 2015-10-12 DIAGNOSIS — IMO0001 Reserved for inherently not codable concepts without codable children: Secondary | ICD-10-CM

## 2015-10-12 DIAGNOSIS — O3663X0 Maternal care for excessive fetal growth, third trimester, not applicable or unspecified: Secondary | ICD-10-CM | POA: Diagnosis present

## 2015-10-12 DIAGNOSIS — O321XX0 Maternal care for breech presentation, not applicable or unspecified: Secondary | ICD-10-CM | POA: Diagnosis present

## 2015-10-12 DIAGNOSIS — O3433 Maternal care for cervical incompetence, third trimester: Principal | ICD-10-CM | POA: Diagnosis present

## 2015-10-12 LAB — CBC
HCT: 35.7 % — ABNORMAL LOW (ref 36.0–46.0)
Hemoglobin: 12 g/dL (ref 12.0–15.0)
MCH: 26.4 pg (ref 26.0–34.0)
MCHC: 33.6 g/dL (ref 30.0–36.0)
MCV: 78.6 fL (ref 78.0–100.0)
Platelets: 228 10*3/uL (ref 150–400)
RBC: 4.54 MIL/uL (ref 3.87–5.11)
RDW: 14.1 % (ref 11.5–15.5)
WBC: 7.1 10*3/uL (ref 4.0–10.5)

## 2015-10-12 LAB — TYPE AND SCREEN
ABO/RH(D): B POS
ANTIBODY SCREEN: NEGATIVE

## 2015-10-12 LAB — GLUCOSE, RANDOM: GLUCOSE: 77 mg/dL (ref 65–99)

## 2015-10-12 LAB — GLUCOSE, CAPILLARY: GLUCOSE-CAPILLARY: 86 mg/dL (ref 65–99)

## 2015-10-12 SURGERY — Surgical Case
Anesthesia: Epidural

## 2015-10-12 MED ORDER — FERROUS SULFATE 325 (65 FE) MG PO TABS
325.0000 mg | ORAL_TABLET | Freq: Two times a day (BID) | ORAL | Status: DC
  Administered 2015-10-13: 325 mg via ORAL
  Filled 2015-10-12: qty 1

## 2015-10-12 MED ORDER — LACTATED RINGERS IV SOLN
INTRAVENOUS | Status: DC
  Administered 2015-10-12: 20:00:00 via INTRAVENOUS

## 2015-10-12 MED ORDER — ACETAMINOPHEN 325 MG PO TABS: 650.0000 mg | ORAL_TABLET | ORAL | Status: DC | PRN

## 2015-10-12 MED ORDER — PROMETHAZINE HCL 25 MG/ML IJ SOLN: 6.2500 mg | INTRAMUSCULAR | Status: DC | PRN

## 2015-10-12 MED ORDER — OXYCODONE-ACETAMINOPHEN 5-325 MG PO TABS: 2.0000 | ORAL_TABLET | ORAL | Status: DC | PRN

## 2015-10-12 MED ORDER — DIBUCAINE 1 % RE OINT: 1.0000 "application " | TOPICAL_OINTMENT | RECTAL | Status: DC | PRN

## 2015-10-12 MED ORDER — PHENYLEPHRINE 40 MCG/ML (10ML) SYRINGE FOR IV PUSH (FOR BLOOD PRESSURE SUPPORT)
PREFILLED_SYRINGE | INTRAVENOUS | Status: AC
  Filled 2015-10-12: qty 10

## 2015-10-12 MED ORDER — ONDANSETRON HCL 4 MG/2ML IJ SOLN: 4.0000 mg | Freq: Four times a day (QID) | INTRAMUSCULAR | Status: DC | PRN

## 2015-10-12 MED ORDER — NALBUPHINE HCL 10 MG/ML IJ SOLN: 5.0000 mg | Freq: Once | INTRAMUSCULAR | Status: DC | PRN

## 2015-10-12 MED ORDER — CEFAZOLIN SODIUM-DEXTROSE 2-3 GM-% IV SOLR
2.0000 g | Freq: Once | INTRAVENOUS | Status: AC
  Administered 2015-10-12: 2 g via INTRAVENOUS
  Filled 2015-10-12: qty 50

## 2015-10-12 MED ORDER — ONDANSETRON HCL 4 MG/2ML IJ SOLN: 4.0000 mg | Freq: Three times a day (TID) | INTRAMUSCULAR | Status: DC | PRN

## 2015-10-12 MED ORDER — LACTATED RINGERS IV SOLN
INTRAVENOUS | Status: DC
  Administered 2015-10-12 (×4): via INTRAVENOUS

## 2015-10-12 MED ORDER — BUPIVACAINE HCL (PF) 0.25 % IJ SOLN
INTRAMUSCULAR | Status: DC | PRN
  Administered 2015-10-12: 10 mL

## 2015-10-12 MED ORDER — PRENATAL MULTIVITAMIN CH
1.0000 | ORAL_TABLET | Freq: Every day | ORAL | Status: DC
  Administered 2015-10-13 – 2015-10-15 (×3): 1 via ORAL
  Filled 2015-10-12 (×3): qty 1

## 2015-10-12 MED ORDER — MENTHOL 3 MG MT LOZG: 1.0000 | LOZENGE | OROMUCOSAL | Status: DC | PRN

## 2015-10-12 MED ORDER — TETANUS-DIPHTH-ACELL PERTUSSIS 5-2.5-18.5 LF-MCG/0.5 IM SUSP: 0.5000 mL | Freq: Once | INTRAMUSCULAR | Status: DC

## 2015-10-12 MED ORDER — BUPIVACAINE HCL (PF) 0.25 % IJ SOLN
INTRAMUSCULAR | Status: AC
  Filled 2015-10-12: qty 30

## 2015-10-12 MED ORDER — SENNOSIDES-DOCUSATE SODIUM 8.6-50 MG PO TABS
2.0000 | ORAL_TABLET | ORAL | Status: DC
  Administered 2015-10-12 – 2015-10-14 (×3): 2 via ORAL
  Filled 2015-10-12 (×3): qty 2

## 2015-10-12 MED ORDER — DIPHENHYDRAMINE HCL 25 MG PO CAPS
25.0000 mg | ORAL_CAPSULE | Freq: Four times a day (QID) | ORAL | Status: DC | PRN
  Administered 2015-10-13 – 2015-10-14 (×3): 25 mg via ORAL
  Filled 2015-10-12 (×3): qty 1

## 2015-10-12 MED ORDER — NALOXONE HCL 2 MG/2ML IJ SOSY
1.0000 ug/kg/h | PREFILLED_SYRINGE | INTRAVENOUS | Status: DC | PRN
  Filled 2015-10-12: qty 2

## 2015-10-12 MED ORDER — DIPHENHYDRAMINE HCL 25 MG PO CAPS: 25.0000 mg | ORAL_CAPSULE | ORAL | Status: DC | PRN

## 2015-10-12 MED ORDER — LIDOCAINE HCL (PF) 1 % IJ SOLN: 30.0000 mL | INTRAMUSCULAR | Status: DC | PRN

## 2015-10-12 MED ORDER — FENTANYL CITRATE (PF) 100 MCG/2ML IJ SOLN: 25.0000 ug | INTRAMUSCULAR | Status: DC | PRN

## 2015-10-12 MED ORDER — MORPHINE SULFATE (PF) 0.5 MG/ML IJ SOLN
INTRAMUSCULAR | Status: DC | PRN
  Administered 2015-10-12 (×2): .5 mg via EPIDURAL
  Administered 2015-10-12: 3 mg via EPIDURAL
  Administered 2015-10-12: 1 mg via EPIDURAL

## 2015-10-12 MED ORDER — METFORMIN HCL 500 MG PO TABS
500.0000 mg | ORAL_TABLET | Freq: Every day | ORAL | Status: DC
  Filled 2015-10-12: qty 1

## 2015-10-12 MED ORDER — NALBUPHINE HCL 10 MG/ML IJ SOLN: 5.0000 mg | INTRAMUSCULAR | Status: DC | PRN

## 2015-10-12 MED ORDER — LANOLIN HYDROUS EX OINT: 1.0000 "application " | TOPICAL_OINTMENT | CUTANEOUS | Status: DC | PRN

## 2015-10-12 MED ORDER — MEPERIDINE HCL 25 MG/ML IJ SOLN: 6.2500 mg | INTRAMUSCULAR | Status: DC | PRN

## 2015-10-12 MED ORDER — CITRIC ACID-SODIUM CITRATE 334-500 MG/5ML PO SOLN
30.0000 mL | ORAL | Status: DC | PRN
Start: 2015-10-12 — End: 2015-10-12
  Administered 2015-10-12: 30 mL via ORAL
  Filled 2015-10-12: qty 15

## 2015-10-12 MED ORDER — WITCH HAZEL-GLYCERIN EX PADS: 1.0000 "application " | MEDICATED_PAD | CUTANEOUS | Status: DC | PRN

## 2015-10-12 MED ORDER — FENTANYL 2.5 MCG/ML BUPIVACAINE 1/10 % EPIDURAL INFUSION (WH - ANES)
14.0000 mL/h | INTRAMUSCULAR | Status: DC | PRN
  Administered 2015-10-12: 14 mL/h via EPIDURAL

## 2015-10-12 MED ORDER — LACTATED RINGERS IV SOLN
500.0000 mL | INTRAVENOUS | Status: DC | PRN
  Administered 2015-10-12: 1000 mL via INTRAVENOUS

## 2015-10-12 MED ORDER — SIMETHICONE 80 MG PO CHEW
80.0000 mg | CHEWABLE_TABLET | Freq: Three times a day (TID) | ORAL | Status: DC
  Administered 2015-10-13 – 2015-10-15 (×8): 80 mg via ORAL
  Filled 2015-10-12 (×7): qty 1

## 2015-10-12 MED ORDER — OXYTOCIN 10 UNIT/ML IJ SOLN
INTRAMUSCULAR | Status: AC
  Filled 2015-10-12: qty 4

## 2015-10-12 MED ORDER — MAGNESIUM HYDROXIDE 400 MG/5ML PO SUSP
30.0000 mL | ORAL | Status: DC | PRN
Start: 2015-10-12 — End: 2015-10-15

## 2015-10-12 MED ORDER — OXYTOCIN BOLUS FROM INFUSION: 500.0000 mL | INTRAVENOUS | Status: DC

## 2015-10-12 MED ORDER — SODIUM BICARBONATE 8.4 % IV SOLN
INTRAVENOUS | Status: AC
  Filled 2015-10-12: qty 50

## 2015-10-12 MED ORDER — SODIUM CHLORIDE 0.9 % IJ SOLN: 3.0000 mL | INTRAMUSCULAR | Status: DC | PRN

## 2015-10-12 MED ORDER — EPHEDRINE 5 MG/ML INJ
10.0000 mg | INTRAVENOUS | Status: DC | PRN
Start: 2015-10-12 — End: 2015-10-12

## 2015-10-12 MED ORDER — SIMETHICONE 80 MG PO CHEW
80.0000 mg | CHEWABLE_TABLET | ORAL | Status: DC
  Administered 2015-10-13 – 2015-10-14 (×3): 80 mg via ORAL
  Filled 2015-10-12 (×3): qty 1

## 2015-10-12 MED ORDER — OXYTOCIN 40 UNITS IN LACTATED RINGERS INFUSION - SIMPLE MED: 62.5000 mL/h | INTRAVENOUS | Status: DC

## 2015-10-12 MED ORDER — METFORMIN HCL 500 MG PO TABS
1000.0000 mg | ORAL_TABLET | Freq: Two times a day (BID) | ORAL | Status: DC
  Administered 2015-10-13 – 2015-10-15 (×5): 1000 mg via ORAL
  Filled 2015-10-12 (×7): qty 2

## 2015-10-12 MED ORDER — DIPHENHYDRAMINE HCL 50 MG/ML IJ SOLN: 12.5000 mg | INTRAMUSCULAR | Status: DC | PRN

## 2015-10-12 MED ORDER — OXYCODONE-ACETAMINOPHEN 5-325 MG PO TABS: 1.0000 | ORAL_TABLET | ORAL | Status: DC | PRN

## 2015-10-12 MED ORDER — OXYTOCIN 10 UNIT/ML IJ SOLN
40.0000 [IU] | INTRAMUSCULAR | Status: DC | PRN
  Administered 2015-10-12: 40 [IU] via INTRAVENOUS

## 2015-10-12 MED ORDER — PHENYLEPHRINE 40 MCG/ML (10ML) SYRINGE FOR IV PUSH (FOR BLOOD PRESSURE SUPPORT): 80.0000 ug | PREFILLED_SYRINGE | INTRAVENOUS | Status: DC | PRN

## 2015-10-12 MED ORDER — ONDANSETRON HCL 4 MG/2ML IJ SOLN
INTRAMUSCULAR | Status: DC | PRN
  Administered 2015-10-12: 4 mg via INTRAVENOUS

## 2015-10-12 MED ORDER — LIDOCAINE HCL (PF) 1 % IJ SOLN
INTRAMUSCULAR | Status: DC | PRN
  Administered 2015-10-12: 3 mL via EPIDURAL
  Administered 2015-10-12: 5 mL via EPIDURAL
  Administered 2015-10-12: 2 mL via EPIDURAL

## 2015-10-12 MED ORDER — ZOLPIDEM TARTRATE 5 MG PO TABS: 5.0000 mg | ORAL_TABLET | Freq: Every evening | ORAL | Status: DC | PRN

## 2015-10-12 MED ORDER — MORPHINE SULFATE (PF) 0.5 MG/ML IJ SOLN
INTRAMUSCULAR | Status: AC
  Filled 2015-10-12: qty 100

## 2015-10-12 MED ORDER — FENTANYL 2.5 MCG/ML BUPIVACAINE 1/10 % EPIDURAL INFUSION (WH - ANES)
INTRAMUSCULAR | Status: AC
  Filled 2015-10-12: qty 125

## 2015-10-12 MED ORDER — ONDANSETRON HCL 4 MG/2ML IJ SOLN
INTRAMUSCULAR | Status: AC
  Filled 2015-10-12: qty 2

## 2015-10-12 MED ORDER — PHENYLEPHRINE HCL 10 MG/ML IJ SOLN
INTRAMUSCULAR | Status: DC | PRN
  Administered 2015-10-12: 120 ug via INTRAVENOUS

## 2015-10-12 MED ORDER — LIDOCAINE-EPINEPHRINE (PF) 2 %-1:200000 IJ SOLN
INTRAMUSCULAR | Status: AC
  Filled 2015-10-12: qty 20

## 2015-10-12 MED ORDER — IBUPROFEN 600 MG PO TABS
600.0000 mg | ORAL_TABLET | Freq: Four times a day (QID) | ORAL | Status: DC
  Administered 2015-10-12 – 2015-10-15 (×6): 600 mg via ORAL
  Filled 2015-10-12 (×10): qty 1

## 2015-10-12 MED ORDER — SIMETHICONE 80 MG PO CHEW: 80.0000 mg | CHEWABLE_TABLET | ORAL | Status: DC | PRN

## 2015-10-12 MED ORDER — NALOXONE HCL 0.4 MG/ML IJ SOLN: 0.4000 mg | INTRAMUSCULAR | Status: DC | PRN

## 2015-10-12 MED ORDER — LIDOCAINE-EPINEPHRINE (PF) 2 %-1:200000 IJ SOLN
INTRAMUSCULAR | Status: DC | PRN
  Administered 2015-10-12 (×2): 5 mL via EPIDURAL

## 2015-10-12 SURGICAL SUPPLY — 44 items
BENZOIN TINCTURE PRP APPL 2/3 (GAUZE/BANDAGES/DRESSINGS) ×3 IMPLANT
CLAMP CORD UMBIL (MISCELLANEOUS) IMPLANT
CLOSURE WOUND 1/2 X4 (GAUZE/BANDAGES/DRESSINGS) ×1
CLOTH BEACON ORANGE TIMEOUT ST (SAFETY) ×3 IMPLANT
CONTAINER PREFILL 10% NBF 15ML (MISCELLANEOUS) IMPLANT
DERMABOND ADHESIVE PROPEN (GAUZE/BANDAGES/DRESSINGS) ×2
DERMABOND ADVANCED .7 DNX6 (GAUZE/BANDAGES/DRESSINGS) ×1 IMPLANT
DRAPE SHEET LG 3/4 BI-LAMINATE (DRAPES) IMPLANT
DRSG OPSITE POSTOP 4X10 (GAUZE/BANDAGES/DRESSINGS) ×3 IMPLANT
DURAPREP 26ML APPLICATOR (WOUND CARE) ×3 IMPLANT
ELECT REM PT RETURN 9FT ADLT (ELECTROSURGICAL) ×3
ELECTRODE REM PT RTRN 9FT ADLT (ELECTROSURGICAL) ×1 IMPLANT
EXTRACTOR VACUUM M CUP 4 TUBE (SUCTIONS) IMPLANT
EXTRACTOR VACUUM M CUP 4' TUBE (SUCTIONS)
GLOVE BIO SURGEON STRL SZ 6.5 (GLOVE) ×2 IMPLANT
GLOVE BIO SURGEONS STRL SZ 6.5 (GLOVE) ×1
GLOVE BIOGEL PI IND STRL 7.0 (GLOVE) ×1 IMPLANT
GLOVE BIOGEL PI INDICATOR 7.0 (GLOVE) ×2
GOWN STRL REUS W/TWL LRG LVL3 (GOWN DISPOSABLE) ×6 IMPLANT
KIT ABG SYR 3ML LUER SLIP (SYRINGE) IMPLANT
LIQUID BAND (GAUZE/BANDAGES/DRESSINGS) IMPLANT
NEEDLE HYPO 22GX1.5 SAFETY (NEEDLE) ×3 IMPLANT
NEEDLE HYPO 25X5/8 SAFETYGLIDE (NEEDLE) IMPLANT
PACK C SECTION WH (CUSTOM PROCEDURE TRAY) ×3 IMPLANT
PAD OB MATERNITY 4.3X12.25 (PERSONAL CARE ITEMS) ×3 IMPLANT
RTRCTR C-SECT PINK 25CM LRG (MISCELLANEOUS) ×3 IMPLANT
STRIP CLOSURE SKIN 1/2X4 (GAUZE/BANDAGES/DRESSINGS) ×2 IMPLANT
SUT MNCRL AB 3-0 PS2 27 (SUTURE) IMPLANT
SUT MON AB 4-0 PS1 27 (SUTURE) IMPLANT
SUT PLAIN 0 NONE (SUTURE) IMPLANT
SUT PLAIN 2 0 (SUTURE) ×4
SUT PLAIN ABS 2-0 CT1 27XMFL (SUTURE) ×2 IMPLANT
SUT VIC AB 0 CT1 27 (SUTURE) ×4
SUT VIC AB 0 CT1 27XBRD ANBCTR (SUTURE) ×2 IMPLANT
SUT VIC AB 0 CTX 36 (SUTURE) ×6
SUT VIC AB 0 CTX36XBRD ANBCTRL (SUTURE) ×3 IMPLANT
SUT VIC AB 2-0 CT1 27 (SUTURE) ×2
SUT VIC AB 2-0 CT1 TAPERPNT 27 (SUTURE) ×1 IMPLANT
SUT VIC AB 3-0 SH 27 (SUTURE)
SUT VIC AB 3-0 SH 27X BRD (SUTURE) IMPLANT
SUT VIC AB 4-0 PS2 27 (SUTURE) IMPLANT
SYR CONTROL 10ML LL (SYRINGE) ×3 IMPLANT
TOWEL OR 17X24 6PK STRL BLUE (TOWEL DISPOSABLE) ×3 IMPLANT
TRAY FOLEY CATH SILVER 14FR (SET/KITS/TRAYS/PACK) ×3 IMPLANT

## 2015-10-12 NOTE — Anesthesia Preprocedure Evaluation (Signed)
Anesthesia Evaluation  Patient identified by MRN, date of birth, ID band Patient awake    Reviewed: Allergy & Precautions, NPO status , Patient's Chart, lab work & pertinent test results  History of Anesthesia Complications Negative for: history of anesthetic complications  Airway Mallampati: III  TM Distance: >3 FB Neck ROM: Full    Dental  (+) Teeth Intact, Dental Advisory Given   Pulmonary neg pulmonary ROS,    Pulmonary exam normal breath sounds clear to auscultation       Cardiovascular Exercise Tolerance: Good negative cardio ROS Normal cardiovascular exam Rhythm:Regular Rate:Normal     Neuro/Psych  Headaches, negative psych ROS   GI/Hepatic negative GI ROS, Neg liver ROS,   Endo/Other  diabetes, Well Controlled, Type 2, Insulin Dependent  Renal/GU negative Renal ROS     Musculoskeletal negative musculoskeletal ROS (+)   Abdominal   Peds  Hematology negative hematology ROS (+)   Anesthesia Other Findings Day of surgery medications reviewed with the patient.  Reproductive/Obstetrics (+) Pregnancy                             Anesthesia Physical Anesthesia Plan  ASA: II  Anesthesia Plan: Epidural   Post-op Pain Management:    Induction:   Airway Management Planned:   Additional Equipment:   Intra-op Plan:   Post-operative Plan:   Informed Consent: I have reviewed the patients History and Physical, chart, labs and discussed the procedure including the risks, benefits and alternatives for the proposed anesthesia with the patient or authorized representative who has indicated his/her understanding and acceptance.   Dental advisory given  Plan Discussed with:   Anesthesia Plan Comments: (Patient identified. Risks/Benefits/Options discussed with patient including but not limited to bleeding, infection, nerve damage, paralysis, failed block, incomplete pain control,  headache, blood pressure changes, nausea, vomiting, reactions to medication both or allergic, itching and postpartum back pain. Confirmed with bedside nurse the patient's most recent platelet count. Confirmed with patient that they are not currently taking any anticoagulation, have any bleeding history or any family history of bleeding disorders. Patient expressed understanding and wished to proceed. All questions were answered. )        Anesthesia Quick Evaluation

## 2015-10-12 NOTE — Anesthesia Procedure Notes (Signed)
Epidural Patient location during procedure: OB  Staffing Anesthesiologist: TURK, STEPHEN EDWARD Performed by: anesthesiologist   Preanesthetic Checklist Completed: patient identified, pre-op evaluation, timeout performed, IV checked, risks and benefits discussed and monitors and equipment checked  Epidural Patient position: sitting Prep: DuraPrep Patient monitoring: blood pressure and continuous pulse ox Approach: midline Location: L3-L4 Injection technique: LOR air  Needle:  Needle type: Tuohy  Needle gauge: 17 G Needle length: 9 cm Needle insertion depth: 5 cm Catheter size: 19 Gauge Catheter at skin depth: 10 cm Test dose: negative and Other (1% Lidocaine)  Additional Notes Patient identified.  Risk benefits discussed including failed block, incomplete pain control, headache, nerve damage, paralysis, blood pressure changes, nausea, vomiting, reactions to medication both toxic or allergic, and postpartum back pain.  Patient expressed understanding and wished to proceed.  All questions were answered.  Sterile technique used throughout procedure and epidural site dressed with sterile barrier dressing. No paresthesia or other complications noted. The patient did not experience any signs of intravascular injection such as tinnitus or metallic taste in mouth nor signs of intrathecal spread such as rapid motor block. Please see nursing notes for vital signs. Reason for block:procedure for pain   

## 2015-10-12 NOTE — Transfer of Care (Signed)
Immediate Anesthesia Transfer of Care Note  Patient: Allison Reed  Procedure(s) Performed: Procedure(s): CESAREAN SECTION (N/A)  Patient Location: PACU  Anesthesia Type:Epidural  Level of Consciousness: sedated  Airway & Oxygen Therapy: Patient Spontanous Breathing  Post-op Assessment: Report given to RN  Post vital signs: Reviewed and stable  Last Vitals:  Filed Vitals:   10/12/15 1600  BP: 125/79  Pulse: 109  Temp:   Resp:     Complications: No apparent anesthesia complications

## 2015-10-12 NOTE — Op Note (Signed)
Preoperative diagnosis: Intrauterine pregnancy at 37 weeks and 3 days                                            Spontaneous labor                                            Incompetent cervix with abdominal cerclage                                             DM on Insulin   Post operative diagnosis: Same  Anesthesia: Epidural  Anesthesiologist: Dr. Harriette Ohara. [place]  Procedure: Primary urgent low transverse cesarean section  Surgeon: Dr. Princess Bruins  Assistant: Dr. Azucena Fallen  Estimated blood loss: 800 cc  Procedure:  After being informed of the planned procedure and possible complications including bleeding, infection, injury to other organs, informed consent is obtained. The patient is taken to OR #1 and the level of epidural anesthesia is raised without complication. She is placed in the dorsal decubitus position with the pelvis tilted to the left. She is then prepped and draped in a sterile fashion. A Foley catheter is already  In place in her bladder.  After assessing adequate level of anesthesia, we infiltrate the suprapubic area with 20 cc of Marcaine 0.25 and perform a Pfannenstiel incision which is brought down sharply to the fascia. The fascia is entered in a low transverse fashion. Linea alba is dissected. Peritoneum is entered in a midline fashion. An Alexis retractor is easily positioned. Visceral peritoneum is entered in a low transverse fashion allowing [place] to safely retract bladder by developing a bladder flap.  The myometrium is then entered in a low transverse fashion; first with knife and then extended bluntly. Amniotic fluid is abundant and clear. We assist the birth of a female  infant in complete breech presentation. Mouth and nose are suctioned. The baby is delivered. The cord is clamped and sectioned. The baby is given to the neonatologist present in the room.  10 cc of blood is drawn from the umbilical vein.The placenta is allowed to deliver spontaneously.  It is complete and the cord has 3 vessels. Uterine revision is negative.  We proceed with closure of the myometrium in 2 layers: First with a running locked suture of 0 Vicryl, then with a Lembert suture of 0 Vicryl imbricating the first one. Hemostasis is completed with cauterization on peritoneal edges.  Both paracolic gutters are cleaned. Both tubes and ovaries are assessed and normal.  We confirm a satisfactory hemostasis.  Retractors and sponges are removed. Under fascia hemostasis is completed with cauterization.  The parietal peritoneum is closed with a running suture of Vicryl 2-0.  The fascia is then closed with 2 running sutures of 0 Vicryl meeting midline. The wound is irrigated with warm saline and hemostasis is completed with cauterization.  The adipose tissue is approximated with a Plain 2-0 running suture. The skin is closed with a subcuticular suture of 4-0 Monocryl and Dermabond.  A Honeycomb dressing is added.  Instrument and sponge count is complete x2. Estimated blood loss is 800 cc.  The procedure is  well tolerated by the patient who is taken to recovery room in a well and stable condition.  A female baby was born at 16:42 and received an Apgar of 8  at 1 minute and 9 at 5 minutes. Weight was pending.    Specimen: Placenta sent to [facility] Grippe MD 11/2/20169:53 PM

## 2015-10-12 NOTE — Lactation Note (Signed)
This note was copied from the chart of Allison Reed. Lactation Consultation Note Initial visit at 4 hours of age.  Called to visit due to CBG of 38.  Mom is type 2 diabetic using metformin and then insulin in 3rd trimester.  Baby is 10#2oz.  Mom has been doing STS and reports a brief feeding at 1800.  Assisted with Hand expression and collected about 29mls, demonstrated and instructed to parents on how to spoon feed EBM.  Baby is not showing feeding cues, but will suck on gloved finger.  Attempted latch in football hold, difficulty in positioning due to baby large size and mom recovering from a c/s.  Repositioned into reclined back position baby sucked a few times and then was sleepy STS with mom.  FOB is asking questions about CBG and feedings.  LC reported to nursery staff to follow up with parents regarding blood sugars.  Chart indicated mom had fertility difficulty and was taking metformin.  This mom may be a candidate for pumping to protect milk supply although not set up at this time.  LC encouraged mom to keep baby STS, Hand express and give colostrum to baby and latch with feeding cues.  Mom mentioned she is ok with whatever for the baby and using formula if needed for supplementing.  LC reported to Nursery nurse and also Engineer, technical sales, Whitney at bedside.  [facility] LC resources given and discussed.  Encouraged to feed with early cues on demand.  Early newborn behavior discussed.  Mom to call for assist as needed.     Patient Name: Allison Reed [id number] Date: 10/12/2015 Reason for consult: Initial assessment (hypoglycemia)   Maternal Data Has patient been taught Hand Expression?: Yes Does the patient have breastfeeding experience prior to this delivery?: No  Feeding Feeding Type: Breast Fed Length of feed:  (few sucks)  LATCH Score/Interventions Latch: Repeated attempts needed to sustain latch, nipple held in mouth throughout feeding, stimulation needed to elicit sucking  reflex. Intervention(s): Adjust position;Assist with latch;Breast massage;Breast compression  Audible Swallowing: None Intervention(s): Skin to skin;Hand expression  Type of Nipple: Flat (short shaft)  Comfort (Breast/Nipple): Soft / non-tender     Hold (Positioning): Full assist, staff holds infant at breast Intervention(s): Breastfeeding basics reviewed;Support Pillows;Position options;Skin to skin  LATCH Score: 4  Lactation Tools Discussed/Used     Consult Status Consult Status: Follow-up Date: 10/13/15 Follow-up type: In-patient    Justice Britain 10/12/2015, 9:05 PM

## 2015-10-12 NOTE — Anesthesia Preprocedure Evaluation (Signed)
Anesthesia Evaluation  Patient identified by MRN, date of birth, ID band Patient awake    Reviewed: Allergy & Precautions, NPO status , Patient's Chart, lab work & pertinent test results  History of Anesthesia Complications Negative for: history of anesthetic complications  Airway Mallampati: III  TM Distance: >3 FB Neck ROM: Full    Dental  (+) Teeth Intact, Dental Advisory Given   Pulmonary neg pulmonary ROS,    Pulmonary exam normal breath sounds clear to auscultation       Cardiovascular Exercise Tolerance: Good negative cardio ROS Normal cardiovascular exam Rhythm:Regular Rate:Normal     Neuro/Psych  Headaches, negative psych ROS   GI/Hepatic negative GI ROS, Neg liver ROS,   Endo/Other  diabetes, Well Controlled, Type 2, Insulin Dependent  Renal/GU negative Renal ROS     Musculoskeletal negative musculoskeletal ROS (+)   Abdominal   Peds  Hematology negative hematology ROS (+)   Anesthesia Other Findings Day of surgery medications reviewed with the patient.  Reproductive/Obstetrics (+) Pregnancy                             Anesthesia Physical  Anesthesia Plan  ASA: II  Anesthesia Plan: Epidural   Post-op Pain Management:    Induction:   Airway Management Planned: Nasal Cannula  Additional Equipment:   Intra-op Plan:   Post-operative Plan:   Informed Consent: I have reviewed the patients History and Physical, chart, labs and discussed the procedure including the risks, benefits and alternatives for the proposed anesthesia with the patient or authorized representative who has indicated his/her understanding and acceptance.   Dental advisory given  Plan Discussed with:   Anesthesia Plan Comments: (Epidural in situ from labor floor.  Will dose with lidocaine and proceed for C-section.)        Anesthesia Quick Evaluation

## 2015-10-12 NOTE — H&P (Signed)
Allison Reed is a 39 y.o. female G44P0040 [redacted]w[redacted]d presenting for Spontaneous Labor with Abdominal Cerclage.  HPP/HPI:  DM on Insulin.  Macrosomia.  Abdominal Cerclage.  Reg UCs.  No AF leak.  No PEC Sx.  OB History    Gravida Para Term Preterm AB TAB SAB Ectopic Multiple Living   5 0   4  4        Past Medical History  Diagnosis Date  . Missed abortion     no surgery required  . Diabetes mellitus without complication (HCC)     Type 2  . Heartburn in pregnancy   . Headache     otc med prn  . 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 UTI in pregnancy 03/2015    recent UTI, treatment completed   Past Surgical History  Procedure Laterality Date  . Appendectomy  2006  . Cervical cerclage  02/2011    vaginal  . Wisdom tooth extraction  2001  . Cerclage laparoscopic abdominal N/A 04/15/2015    Procedure: LAPAROSCOPIC TRANSABDOMINAL CERVCOISTHMIC CERCLAGE;  Surgeon: Governor Specking, MD;  Location: [facility];  Service: Gynecology;  Laterality: N/A;   Family History: family history is not on file. Social History:  reports that she has never smoked. She has never used smokeless tobacco. She reports that she drinks alcohol. She reports that she does not use illicit drugs.  No Known Allergies    Blood pressure 125/79, pulse 109, temperature 97.6 F (36.4 C), temperature source Oral, resp. rate 18, height 5\' 7"  (1.702 m), weight 175 lb 9.6 oz (79.652 kg). Exam Physical Exam   FHR monitoring Cat 1 UCs reg q 81min.  HPP:  Patient Active Problem List   Diagnosis Date Noted  . Active labor 10/12/2015    Prenatal labs: ABO, Rh: --/--/B POS (11/02 1315) Antibody: NEG (11/02 1315) Rubella: Immune RPR: Nonreactive (04/13 0000)  HBsAg: Negative (04/13 0000)  HIV: Non-reactive (04/13 0000)  Genetic testing:  wnl [place] anato: wnl GBS: Negative (10/19 0000)   Assessment/Plan: 37 3/7 wks with Abdominal cerclage/Spontaneous Labor/DM Insulin.  Urgent Primary C/S.  Surgery/risks reviewed  includint trauma/infection/Hemorrhage/Bl transfusion/DVT/PE/Anesthesia Cx.   Tremell Reimers,MARIE-LYNE 10/12/2015, 4:14 PM

## 2015-10-12 NOTE — Anesthesia Postprocedure Evaluation (Signed)
  Anesthesia Post-op Note  Patient: Allison Reed  Procedure(s) Performed: Procedure(s): CESAREAN SECTION (N/A)  Patient Location: PACU  Anesthesia Type:Epidural  Level of Consciousness: awake, alert , oriented and patient cooperative  Airway and Oxygen Therapy: Patient Spontanous Breathing  Post-op Pain: none  Post-op Assessment: Post-op Vital signs reviewed, Patient's Cardiovascular Status Stable, Respiratory Function Stable, Patent Airway, No signs of Nausea or vomiting, Pain level controlled, No headache, No backache, Spinal receding and Patient able to bend at knees              Post-op Vital Signs: Reviewed and stable  Last Vitals:  Filed Vitals:   10/12/15 1800  BP: 121/84  Pulse: 90  Temp:   Resp: 22    Complications: No apparent anesthesia complications

## 2015-10-13 ENCOUNTER — Encounter (HOSPITAL_COMMUNITY): Payer: Self-pay | Admitting: Anesthesiology

## 2015-10-13 ENCOUNTER — Encounter (HOSPITAL_COMMUNITY): Payer: Self-pay | Admitting: Obstetrics & Gynecology

## 2015-10-13 LAB — CBC
HCT: 33.6 % — ABNORMAL LOW (ref 36.0–46.0)
HEMOGLOBIN: 11.2 g/dL — AB (ref 12.0–15.0)
MCH: 26.1 pg (ref 26.0–34.0)
MCHC: 33.3 g/dL (ref 30.0–36.0)
MCV: 78.3 fL (ref 78.0–100.0)
PLATELETS: 198 10*3/uL (ref 150–400)
RBC: 4.29 MIL/uL (ref 3.87–5.11)
RDW: 14 % (ref 11.5–15.5)
WBC: 7.1 10*3/uL (ref 4.0–10.5)

## 2015-10-13 LAB — GLUCOSE, CAPILLARY
GLUCOSE-CAPILLARY: 103 mg/dL — AB (ref 65–99)
GLUCOSE-CAPILLARY: 168 mg/dL — AB (ref 65–99)
GLUCOSE-CAPILLARY: 131 mg/dL — AB (ref 65–99)
Glucose-Capillary: 95 mg/dL (ref 65–99)
Glucose-Capillary: 158 mg/dL — ABNORMAL HIGH (ref 65–99)

## 2015-10-13 LAB — RPR: RPR Ser Ql: NONREACTIVE

## 2015-10-13 NOTE — Anesthesia Postprocedure Evaluation (Signed)
  Anesthesia Post-op Note  Patient: Allison Reed  Procedure(s) Performed: * No procedures listed *  Patient Location: PACU and Mother/Baby  Anesthesia Type:Epidural  Level of Consciousness: awake, alert  and oriented  Airway and Oxygen Therapy: Patient Spontanous Breathing  Post-op Pain: mild  Post-op Assessment: Patient's Cardiovascular Status Stable, Respiratory Function Stable, No signs of Nausea or vomiting, Adequate PO intake, Pain level controlled, No headache, No backache and Patient able to bend at knees              Post-op Vital Signs: Reviewed and stable  Last Vitals:  Filed Vitals:   10/13/15 0934  BP: 102/75  Pulse: 85  Temp: 36.3 C  Resp: 18    Complications: No apparent anesthesia complications

## 2015-10-13 NOTE — Lactation Note (Addendum)
This note was copied from the chart of Allison Reed. Lactation Consultation Note  Patient Name: Allison Frances Ambrosino [id number] Date: 10/13/2015 Reason for consult: Follow-up assessment  Baby 40 hours old and has not had a stool. Mom reports that baby has nursed well, but is now sleepy at breast. Parents report that it has been over 3 hours since baby at breast, and baby is not cueing to nurse. Assisted mom to position herself with pillows and latch baby in football position to right breast. Mom able to easily express colostrum/transitional milk, but baby sleepy and not interested in latching. Baby would suckle LC's gloved finger, and mom able to dribble EBM into baby's mouth, but baby would not latch to breast. Baby's lingual frenulum appears tight. 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 Showed parents how baby's tongue is bowl-shaped and does not extend well to the top of baby's mouth/soft palate. Enc parents to supplement with EBM and spoon. Enc mom to start using DEBP. Mom states that she thinks she wants to use personal pump that she can obtain from hospital store. Enc mom to call insurance after supplementing baby, and then use either personal pump or DEBP hospital-grade pump. Discussed assessment, interventions, and plan with patient's RN, Sharyn Lull and she will set mom up either with personal or hospital DEBP. Parents have NS in room and curve-tipped syringe for supplementing at breast as well.   Maternal Data    Feeding Feeding Type: Breast Fed Length of feed: 0 min  LATCH Score/Interventions Latch: Too sleepy or reluctant, no latch achieved, no sucking elicited. Intervention(s): Skin to skin;Teach feeding cues;Waking techniques Intervention(s): Adjust position;Assist with latch;Breast massage;Breast compression  Audible Swallowing: None Intervention(s): Skin to skin;Hand expression  Type of Nipple: Flat (short  shaft) Intervention(s): Shells;Hand pump  Comfort (Breast/Nipple): Soft / non-tender     Hold (Positioning): Assistance needed to correctly position infant at breast and maintain latch. Intervention(s): Breastfeeding basics reviewed;Support Pillows;Position options;Skin to skin  LATCH Score: 4  Lactation Tools Discussed/Used Tools: Nipple Shields Nipple shield size: 20 Shell Type: Inverted Breast pump type: Manual Initiated by:: bedside RN   Consult Status Consult Status: Follow-up Date: 10/14/15 Follow-up type: In-patient    Inocente Salles 10/13/2015, 12:54 PM

## 2015-10-13 NOTE — Progress Notes (Signed)
POSTOPERATIVE DAY # 1 S/P cs   S:         Reports feeling well - request diet change due to being vegetarian             Tolerating po intake / no  nausea / no vomiting / no flatus / no BM             Bleeding is light             Pain controlled withlongacing narcotic med so far             Up ad lib / ambulatory/ voiding QS  Newborn breast feeding  / female  O:  VS: BP 98/76 mmHg  Pulse 86  Temp(Src) 97.8 F (36.6 C) (Axillary)  Resp 18  Ht 5\' 7"  (1.702 m)  Wt 79.652 kg (175 lb 9.6 oz)  BMI 27.50 kg/m2  SpO2 98%  Breastfeeding? Unknown              FBS 103 this am   LABS:               Recent Labs  10/12/15 1315 10/13/15 0610  WBC 7.1 7.1  HGB 12.0 11.2*  PLT 228 198               Bloodtype: --/--/B POS (11/02 1315)  Rubella: Immune (04/13 0000)                                             I&O: Intake/Output      11/02 0701 - 11/03 0700 11/03 0701 - 11/04 0700   P.O. 720    I.V. (mL/kg) 3235.4 (40.6)    Total Intake(mL/kg) 3955.4 (49.7)    Urine (mL/kg/hr) 3500    Blood 800    Total Output 4300     Net -344.6                       Physical Exam:             Alert and Oriented X3  Lungs: Clear and unlabored  Heart: regular rate and rhythm / no mumurs  Abdomen: soft, non-tender, non-distended hypoactive BS             Fundus: firm, non-tender, Ueven             Dressing intact honeycomb              Incision:  approximated with sutures / no erythema / no ecchymosis / no drainage  Perineum: intact  Lochia: light  Extremities: no edema, no calf pain or tenderness, SCD in place  A:        POD # 1 S/P CS            Pre-existing glucose intolerance - return to metformin dose pre-pregnancy            Vegetarian  P:        Routine postoperative care              Adjust menu to accomodate for vegetarian requirements             Metformin 1000mg  bid - continue to monitor cbg     Artelia Laroche CNM, MSN, [facility] 10/13/2015, 9:12 AM

## 2015-10-14 LAB — GLUCOSE, CAPILLARY
GLUCOSE-CAPILLARY: 78 mg/dL (ref 65–99)
GLUCOSE-CAPILLARY: 119 mg/dL — AB (ref 65–99)
GLUCOSE-CAPILLARY: 115 mg/dL — AB (ref 65–99)

## 2015-10-14 LAB — BIRTH TISSUE RECOVERY COLLECTION (PLACENTA DONATION)

## 2015-10-14 NOTE — Lactation Note (Signed)
This note was copied from the chart of Allison Reed. Lactation Consultation Note  Patient Name: Allison Reed [id number] Date: 10/14/2015 Reason for consult: Follow-up assessment Mom is bf and supplementing now. FOB reports mom is making more milk today and baby is staying awake on the breast longer. Encouraged them to continue bf on demand and pumping/manaul expression after each feeding. They have the supplementing guidelines and formula if needed. Reminded them of OP services and support group. Mom will call as needed for bf help.   Maternal Data    Feeding Feeding Type: Bottle Fed - Formula Nipple Type: Slow - flow Length of feed: 20 min  LATCH Score/Interventions                      Lactation Tools Discussed/Used     Consult Status Consult Status: Follow-up Date: 10/15/15 Follow-up type: In-patient    Denzil Hughes 10/14/2015, 10:47 PM

## 2015-10-14 NOTE — Progress Notes (Signed)
Patient ID: Allison Reed, female   DOB: 28-Apr-1976, 39 y.o.   MRN: [id number] Subjective: S/P Primary Cesarean Delivery d/t Abdominal Cerclage / Active Labor / LGA POD# 2 Information for the patient's newborn:  Tangi, Shroff [[id number]]  female   Reports feeling well Feeding: breast Patient reports tolerating PO.  Breast symptoms: small amounts of colostrum Pain controlled with ibuprofen (OTC) and narcotic analgesics including Percocet - "burning sensation at corners of incision" Denies HA/SOB/C/P/N/V/dizziness. Flatus absent. No BM. She reports vaginal bleeding as normal, without clots.  She is ambulating, urinating without difficult.     Objective:   VS:  Filed Vitals:   10/13/15 0934 10/13/15 1405 10/13/15 1900 10/14/15 0608  BP: 102/75 119/81 122/85 115/75  Pulse: 85 91 107 78  Temp: 97.4 F (36.3 C) 96.8 F (36 C) 98.7 F (37.1 C) 98.2 F (36.8 C)  TempSrc: Oral Axillary Axillary Axillary  Resp: 18 18 18 18   Height:      Weight:      SpO2: 98% 99%       Intake/Output Summary (Last 24 hours) at 10/14/15 1022 Last data filed at 10/13/15 1417  Gross per 24 hour  Intake    380 ml  Output   1090 ml  Net   -710 ml        Recent Labs  10/12/15 1315 10/13/15 0610  WBC 7.1 7.1  HGB 12.0 11.2*  HCT 35.7* 33.6*  PLT 228 198     Blood type: --/--/B POS (11/02 1315)  Rubella: Immune (04/13 0000)     Physical Exam:   General: alert, cooperative and no distress  CV: Regular rate and rhythm, S1S2 present or without murmur or extra heart sounds  Resp: clear  Abdomen: soft, nontender, normal bowel sounds  Incision: clean, dry, intact and skin well-approximated with sutures  Uterine Fundus: firm, 1 FB below umbilicus, nontender  Lochia: minimal  Ext: extremities normal, atraumatic, no cyanosis or edema, Homans sign is negative, no sign of DVT and no edema, redness or tenderness in the calves or thighs   Assessment/Plan: 39 y.o.   POD# 2.  S/P Cesarean  Delivery.  Indications: Abdominal Cerclage / LGA                Principal Problem:   Postpartum care following cesarean delivery (11/2) Active Problems:   Active labor   Postoperative state  Doing well, stable.               Regular diet as tolerated Ambulate Routine post-op care Explained that burning is not unusual in healing process from cesarean delivery Encouraged increased water hydration Anticipate d/c home tomorrow  Laury Deep, M, MSN, CNM 10/14/2015, 10:22 AM

## 2015-10-14 NOTE — Lactation Note (Signed)
This note was copied from the chart of Allison Reed. Lactation Consultation Note Spoke with night shift RN and day shift RN about mom and BF's. Mom needs to increase BF time and supplemenation amount. RN's stated they encouraged mom to do that but she wasn't doing it. I spoke w/mom reguarding weight loss and concern that she needs to BF longer and supplement more after feeding. Mom stated OK. Reported to on coming [place]. Patient Name: Allison Tyshae Stair [id number] Date: 10/14/2015 Reason for consult: Follow-up assessment;Infant weight loss   Maternal Data    Feeding    LATCH Score/Interventions                      Lactation Tools Discussed/Used     Consult Status Consult Status: Follow-up Date: 10/14/15 Follow-up type: In-patient    Brittini Brubeck, Elta Guadeloupe 10/14/2015, 8:06 AM

## 2015-10-14 NOTE — Anesthesia Postprocedure Evaluation (Signed)
  Anesthesia Post-op Note  Patient: Allison Reed  Procedure(s) Performed: * No procedures listed *  Patient Location: Mother/Baby  Anesthesia Type:Epidural  Level of Consciousness: awake, alert  and oriented  Airway and Oxygen Therapy: Patient Spontanous Breathing  Post-op Pain: none  Post-op Assessment: Post-op Vital signs reviewed, Patient's Cardiovascular Status Stable, Respiratory Function Stable, Patent Airway, No signs of Nausea or vomiting, Adequate PO intake, Pain level controlled, No headache, No backache and Patient able to bend at knees              Post-op Vital Signs: Reviewed and stable  Last Vitals:  Filed Vitals:   10/14/15 0608  BP: 115/75  Pulse: 78  Temp: 36.8 C  Resp: 18    Complications: No apparent anesthesia complications

## 2015-10-15 LAB — GLUCOSE, CAPILLARY
GLUCOSE-CAPILLARY: 158 mg/dL — AB (ref 65–99)
Glucose-Capillary: 123 mg/dL — ABNORMAL HIGH (ref 65–99)
Glucose-Capillary: 88 mg/dL (ref 65–99)

## 2015-10-15 NOTE — Progress Notes (Signed)
POD # 3  Subjective: Pt reports feeling well, ready for discharge/ Itching if back and buttocks Pain controlled without meds Tolerating po/Voiding without problems/ No n/v/ Flatus present Activity: ad lib Bleeding is light Newborn info:  Information for the patient's newborn:  [place], [place]de[place] [[id number]]  female Feeding: breast  Objective: VS: VS:  Filed Vitals:   10/13/15 1900 10/14/15 0608 10/14/15 1852 10/15/15 0542  BP: 122/85 115/75 113/66 82/58  Pulse: 107 78 75 89  Temp: 98.7 F (37.1 C) 98.2 F (36.8 C) 97.6 F (36.4 C) 97.9 F (36.6 C)  TempSrc: Axillary Axillary Oral Oral  Resp: 18 18 18 17   Height:      Weight:      SpO2:        I&O: Intake/Output      11/04 0701 - 11/05 0700 11/05 0701 - 11/06 0700   P.O.     Total Intake(mL/kg)     Urine (mL/kg/hr)     Total Output       Net              LABS:  Recent Labs  10/12/15 1315 10/13/15 0610  WBC 7.1 7.1  HGB 12.0 11.2*  PLT 228 198   Blood type: --/--/B POS (11/02 1315) Rubella: Immune (04/13 0000)          FBS: 88 Postprandials range: 115-123  Physical Exam:  General: alert, cooperative and no distress CV: Regular rate and rhythm Resp: CTA bilaterally Skin: small welps to mid and lower back, mildly erythemic, no exudate Abdomen: soft, nontender, normal bowel sounds Incision: Covered with Tegaderm and honeycomb dressing; no significant drainage, edema, bruising, or erythema; well approximated with suture Uterine Fundus: firm, below umbilicus, nontender Lochia: minimal Ext: extremities normal, atraumatic, no cyanosis or edema and Homans sign is negative, no sign of DVT   Assessment: POD # 3/ G5P1041/ S/P C/Section d/t abdominal cerclage, labor Type II DM Allergic dermatitis Doing well and stable for discharge home  Plan: Discharge home Declines Ibuprofen & Percocet Rx-will use OTC Motrin Hydrocortisone cream (OTC) to affected areas TID Continue Metformin 1000 mg po bid (has  Rx) Continue BS checks at home Follow-up with Dr. Forde Dandy (endocrinologist) in 6 wks (already scheduled) Follow up in 6 wks for postpartum check at [facility] booklet given    Signed: Julianne Handler, Delane Ginger, MSN, CNM 10/15/2015, 11:24 AM

## 2015-10-15 NOTE — Lactation Note (Signed)
This note was copied from the chart of Allison Reed. Lactation Consultation Note' Parents trying to wake baby for feeding when I went into room. Reports baby last fed at 5:30 am- had 30 cc's of formula after nursing. Mom reports baby is not staying latched on very well. Suggested unwrapping and undressing baby. Attempted to latch- baby on and off the breast. Used #24 NS and baby latched well Got fussy- did put some formula in NS with curved tip syringe. Mom reports this is the best she has done staying on the breast. Suggested supplementing with feeding tube and syringe and parents agreeable. Baby latched well and nursed for 15 more min and took 35 cc's formula. Off to sleep. Parents pleased with how feeding has gone. Encouraged to continue pumping to promote milk supply. Has Medela pump for home. Reviewed setup, use and cleaning of syringe/feeding tube. No questions at present. To call at next feeding for assist.  Patient Name: Allison Reed [id number] Date: 10/15/2015 Reason for consult: Follow-up assessment;Other (Comment) (diabetic mom)   Maternal Data Formula Feeding for Exclusion: No Has patient been taught Hand Expression?: Yes Does the patient have breastfeeding experience prior to this delivery?: No  Feeding Feeding Type: Breast Fed Length of feed: 30 min  LATCH Score/Interventions Latch: Grasps breast easily, tongue down, lips flanged, rhythmical sucking.  Audible Swallowing: A few with stimulation  Type of Nipple: Everted at rest and after stimulation (short nipples) Intervention(s): Double electric pump  Comfort (Breast/Nipple): Soft / non-tender     Hold (Positioning): Assistance needed to correctly position infant at breast and maintain latch. Intervention(s): Breastfeeding basics reviewed  LATCH Score: 8  Lactation Tools Discussed/Used Tools: Nipple Shields;19F feeding tube / Syringe Nipple shield size: 24 WIC Program: No   Consult Status Consult Status:  Follow-up Date: 10/15/15 Follow-up type: In-patient    Truddie Crumble 10/15/2015, 11:44 AM

## 2015-10-15 NOTE — Discharge Summary (Signed)
DISCHARGE SUMMARY:  Patient ID: Allison Reed MRN: [id number] DOB/AGE: 06-09-1976 39 y.o.  Admit date: 10/12/2015 Admission Diagnoses: 37.[redacted] weeks gestation, spontaneous labor   Discharge date: 10/15/2015 Discharge Diagnoses: S/P C/S on 10/12/15, Type II DM        Prenatal history: [id number]   EDC: 10/30/2015, by Patient Reported  Prenatal care at [facility] since [redacted] wks gestation. Primary provider: Dr. Dellis Filbert Prenatal course complicated by DM on Insulin, Macrosomia, Abdominal Cerclage, AMA  Prenatal labs: ABO, Rh: --/--/B POS (11/02 1315)  Antibody: NEG (11/02 1315) Rubella: Immune RPR: Non Reactive (11/02 1315)  HBsAg: Negative (04/13 0000)  HIV: Non-reactive (04/13 0000)  GBS: Negative (10/19 0000)  GTT: n/a  Medical / Surgical History :  Past medical history:  Past Medical History  Diagnosis Date  . Missed abortion     no surgery required  . Diabetes mellitus without complication (HCC)     Type 2  . Heartburn in pregnancy   . Headache     otc med prn  . 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 UTI in pregnancy 03/2015    recent UTI, treatment completed  . Postpartum care following cesarean delivery (11/2) 10/12/2015    Past surgical history:  Past Surgical History  Procedure Laterality Date  . Appendectomy  2006  . Cervical cerclage  02/2011    vaginal  . Wisdom tooth extraction  2001  . Cerclage laparoscopic abdominal N/A 04/15/2015    Procedure: LAPAROSCOPIC TRANSABDOMINAL CERVCOISTHMIC CERCLAGE;  Surgeon: Governor Specking, MD;  Location: [facility];  Service: Gynecology;  Laterality: N/A;  . Cesarean section N/A 10/12/2015    Procedure: CESAREAN SECTION;  Surgeon: Princess Bruins, MD;  Location: [facility];  Service: Obstetrics;  Laterality: N/A;     Medications on Admission: Prescriptions prior to admission  Medication Sig Dispense Refill Last Dose  . acetaminophen (TYLENOL) 325 MG tablet Take 325 mg by mouth every 6 (six) hours as needed  for mild pain or headache.   10/12/2015 at 1000  . insulin aspart (NOVOLOG) 100 UNIT/ML injection Inject 4 Units into the skin daily with supper.   10/11/2015 at Unknown time  . insulin detemir (LEVEMIR) 100 UNIT/ML injection Inject 12 Units into the skin daily with supper.   10/11/2015 at Unknown time  . metFORMIN (GLUCOPHAGE) 500 MG tablet Take 500-1,000 mg by mouth 3 (three) times daily. Patient takes 1000 mg in the morning, 500 mg in the afternoon and 1000 mg at night.   10/11/2015 at Unknown time  . Prenatal Vit-Fe Fumarate-FA (PRENATAL MULTIVITAMIN) TABS tablet Take 1 tablet by mouth daily at 12 noon.   10/11/2015 at Unknown time  . indomethacin (INDOCIN) 25 MG capsule Take 1 capsule (25 mg total) by mouth 3 (three) times daily after meals. (Patient not taking: Reported on 10/11/2015) 8 capsule 0 More than a month at Unknown time    Allergies: Review of patient's allergies indicates no known allergies.   Intrapartum Course:  Admitted for labor and urgent primary CS under regional anesthesia.   Postpartum Course: Uncomplicated. Discharged home on POD #3.  Physical Exam:   VSS: Blood pressure 82/58, pulse 89, temperature 97.9 F (36.6 C), temperature source Oral, resp. rate 17, height 5\' 7"  (1.702 m), weight 79.652 kg (175 lb 9.6 oz), SpO2 99 %, unknown if currently breastfeeding.  LABS:  Recent Labs  10/12/15 1315 10/13/15 0610  WBC 7.1 7.1  HGB 12.0 11.2*  PLT 228 198    General: alert and oriented  x3 Heart: RRR Lungs: CTA bilaterally GI: soft, non-tender, non-distended, BS x4 Lochia: small Uterus: firm below umbilicus Incision: well approximated; honeycomb dressing-no significant erythema, drainage, or edema Extremities: No edema, Homans neg   Newborn Data Live born female  Birth Weight: 10 lb 2.6 oz (4610 g) APGAR: 8, 9  See operative report for further details  Home with mother.  Discharge Instructions:  Wound Care: keep clean and dry / remove honeycomb POD  6 Postpartum Instructions: [facility] discharge booklet - instructions reviewed Medications:    Medication List    STOP taking these medications        acetaminophen 325 MG tablet  Commonly known as:  TYLENOL     indomethacin 25 MG capsule  Commonly known as:  INDOCIN     insulin aspart 100 UNIT/ML injection  Commonly known as:  novoLOG     insulin detemir 100 UNIT/ML injection  Commonly known as:  LEVEMIR     metFORMIN 500 MG tablet  Commonly known as:  GLUCOPHAGE      TAKE these medications        prenatal multivitamin Tabs tablet  Take 1 tablet by mouth daily at 12 noon.      Metformin 1000 mg po bid (has Rx) Motrin (OTC) 600 mg po q6 hrs prn pain  *pt declined Percocet      Follow-up Information    Follow up with LAVOIE,MARIE-LYNE, MD. Schedule an appointment as soon as possible for a visit in 6 weeks.   Specialty:  Obstetrics and Gynecology   Contact information:   [address] [place] [address] [phone]         Signed: Julianne Handler, Delane Ginger MSN, CNM 10/15/2015, 11:26 AM

## 2015-10-15 NOTE — Lactation Note (Signed)
This note was copied from the chart of Allison Reed. Lactation Consultation Note; Called to observe feeding before DC. Mom had baby latched to breast with NS- up in chair when I went into room. Mom reports feeling much more confident with breast feeding. Breasts are feeling much heavier. Whitish milk noted in NS when baby came off the breast. Reviewed engorgement prevention and treatment. Has Medeal pump for home. Baby under phototherapy but parents had taken him out before latching. No questions at present. To call prn  Patient Name: Allison Reed [id number] Date: 10/15/2015 Reason for consult: Follow-up assessment   Maternal Data Formula Feeding for Exclusion: No Has patient been taught Hand Expression?: Yes Does the patient have breastfeeding experience prior to this delivery?: No  Feeding Feeding Type: Breast Fed Length of feed: 25 min  LATCH Score/Interventions Latch: Grasps breast easily, tongue down, lips flanged, rhythmical sucking.  Audible Swallowing: A few with stimulation  Type of Nipple: Everted at rest and after stimulation Intervention(s): Double electric pump  Comfort (Breast/Nipple): Soft / non-tender     Hold (Positioning): No assistance needed to correctly position infant at breast. Intervention(s): Breastfeeding basics reviewed  LATCH Score: 9  Lactation Tools Discussed/Used Tools: Nipple Shields Nipple shield size: 24 WIC Program: No Pump Review: Setup, frequency, and cleaning   Consult Status Consult Status: Complete Date: 10/15/15 Follow-up type: In-patient    Truddie Crumble 10/15/2015, 3:29 PM

## 2015-10-17 ENCOUNTER — Encounter (HOSPITAL_COMMUNITY): Payer: Self-pay | Admitting: Obstetrics & Gynecology

## 2015-10-17 NOTE — Addendum Note (Signed)
Addendum  created 10/17/15 1552 by Catalina Gravel, MD   Modules edited: Anesthesia Events, Narrator   Narrator:  Narrator: Event Log Edited

## 2015-10-19 NOTE — Addendum Note (Signed)
Addendum  created 10/19/15 1359 by Catalina Gravel, MD   Modules edited: Anesthesia Events, Anesthesia Medication Administration, Narrator, Narrator Events   Narrator:  Narrator: Event Log Edited   Narrator Events:  Delete Anesthesia Stop event; Delete Anesthesia Stop event

## 2015-10-20 NOTE — Addendum Note (Signed)
Addendum  created 10/20/15 1023 by Catalina Gravel, MD   Modules edited: Anesthesia Responsible Staff

## 2015-10-21 ENCOUNTER — Ambulatory Visit (HOSPITAL_COMMUNITY)
Admission: RE | Admit: 2015-10-21 | Discharge: 2015-10-21 | Disposition: A | Discharge status: Home / Self Care | Payer: 59 | Source: Ambulatory Visit | Attending: Obstetrics & Gynecology | Admitting: Obstetrics & Gynecology

## 2015-10-21 NOTE — Lactation Note (Signed)
Lactation Consult;   Parents concerned about weight gain. Baby has been 9-0 at [facility] office on two separate visits Today here 8-13.9 Baby has been feeding about 7 times/24 hours. At night mom breast feeds at 12 mn Dad feeds formula at 3 am and the baby wakes at 7 am. Mom is not nursing or pumping for 8 hours. Encouraged to nurse or pump once through that time to promote milk supply. Is using manual pump- she says it is easier. Has Medela DEBP at home. Mom reports baby is sleepy at many feedings and they wake her up through the day every 3 hours. Baby was under phototherapy for 3 days. Encouraged to feed at least 8 times/ 24 hours and to nurse on both breasts at each feeding. Baby did well here with stimulation to continue nursing. [facility] to come to house on Monday No questions at present. To call prn  Mother's reason for visit:  Was using NS in hospital Visit Type:  Feeding assessment Appointment Notes:  Baby was under phototherapy until Wednesday Consult:  Initial Lactation Consultant:  Allison Reed  ________________________________________________________________________  10 Name: Allison Reed Date of Birth: 10/12/2013 Pediatrician: Einar Gip Gender: female Gestational Age: [redacted]w[redacted]d (At Birth) Birth Weight: 10 lb 2.6 oz (4610 g) Weight at Discharge: Weight: 9 lb 5.2 oz (4230 g)Date of Discharge: 10/15/2015 Filed Weights   10/13/15 0045 10/13/15 2343 10/14/15 2300  Weight: 9 lb 15.8 oz (4530 g) 9 lb 10 oz (4365 g) 9 lb 5.2 oz (4230 g)     Weight at Ped 9-0 Weight today 8- 13.6  4104g  ________________________________________________________________________  Mother's Name: Allison Reed Breastfeeding Experience:  P1 ________________________________________________________________________  Breastfeeding History (Post Discharge)  Frequency of breastfeeding:  6 times/day Duration of feeding:  15-20 min  Supplementation  Formula:  Volume  60 Frequency:  q 2 times/day        Brand: alimentnum  Breastmilk:  Volume 1-2 oz  3-4 times/day  Method:  Bottle,   Pumping  Type of pump:  Manual and Medela pump in style Frequency:  When breasts feel hard Volume:  1-2 oz  Infant Intake and Output Assessment  Voids:  5-6 in 24 hrs.  Color:  Clear yellow Stools:  5 in 24 hrs.  Color:  Allison Reed  ________________________________________________________________________  Maternal Breast Assessment  Breast:  Filling Nipple:  Erect   _______________________________________________________________________ Feeding Assessment/Evaluation  Initial feeding assessment:    Positioning:  Cross cradle Right breast  LATCH documentation:  Latch:  2 = Grasps breast easily, tongue down, lips flanged, rhythmical sucking.  Audible swallowing:  1 = A few with stimulation  Type of nipple:  2 = Everted at rest and after stimulation  Comfort (Breast/Nipple):  2 = Soft / non-tender  Hold (Positioning):  1 = Assistance needed to correctly position infant at breast and maintain latch  LATCH score:  8  Attached assessment:  Deep- mom needed encouragement to keep baby close to the breast with wide open mouth- uis letting her slide to tip of nipple  Lips flanged:  Yes.    Lips untucked:  No.  Suck assessment:  Displays both  Tools:  Nipple shield 16 mm on left breast- mom reports she is unable at times to get her to latch to left breast without NS Instructed on use and cleaning of tool:  Yes.    Pre-feed weight:  4014 g  (8 lb. 13.6 oz.) Post-feed weight:  4050 g (8 lb. 14.8  oz.) Amount transferred: 36 ml Amount supplemented:  0 ml   Pre-feed weight:  4050 g  (8 lb. 14.8 oz.) Post-feed weight:  4110 g (9 lb. 0.7 oz.) Amount transferred:  60 ml Amount supplemented:  0 m  Total amount transferred:  96 ml Total supplement given:  0 ml

## 2015-10-25 ENCOUNTER — Inpatient Hospital Stay (HOSPITAL_COMMUNITY): Admission: RE | Admit: 2015-10-25 | Payer: Self-pay | Source: Ambulatory Visit

## 2015-10-26 ENCOUNTER — Encounter (HOSPITAL_COMMUNITY): Admission: RE | Payer: Self-pay | Source: Ambulatory Visit

## 2015-10-26 ENCOUNTER — Inpatient Hospital Stay (HOSPITAL_COMMUNITY): Admission: RE | Admit: 2015-10-26 | Payer: 59 | Source: Ambulatory Visit | Admitting: Obstetrics & Gynecology

## 2015-10-26 SURGERY — Surgical Case
Anesthesia: Spinal

## 2016-03-13 DIAGNOSIS — E048 Other specified nontoxic goiter: Secondary | ICD-10-CM | POA: Diagnosis not present

## 2016-03-13 DIAGNOSIS — Z6823 Body mass index (BMI) 23.0-23.9, adult: Secondary | ICD-10-CM | POA: Diagnosis not present

## 2016-03-13 DIAGNOSIS — E784 Other hyperlipidemia: Secondary | ICD-10-CM | POA: Diagnosis not present

## 2016-03-13 DIAGNOSIS — Z1389 Encounter for screening for other disorder: Secondary | ICD-10-CM | POA: Diagnosis not present

## 2016-03-13 DIAGNOSIS — E119 Type 2 diabetes mellitus without complications: Secondary | ICD-10-CM | POA: Diagnosis not present

## 2016-08-29 ENCOUNTER — Other Ambulatory Visit: Payer: Self-pay | Admitting: Internal Medicine

## 2016-08-29 DIAGNOSIS — M25562 Pain in left knee: Secondary | ICD-10-CM | POA: Diagnosis not present

## 2016-08-29 DIAGNOSIS — M25641 Stiffness of right hand, not elsewhere classified: Secondary | ICD-10-CM | POA: Diagnosis not present

## 2016-08-29 DIAGNOSIS — E119 Type 2 diabetes mellitus without complications: Secondary | ICD-10-CM | POA: Diagnosis not present

## 2016-08-29 DIAGNOSIS — M25541 Pain in joints of right hand: Secondary | ICD-10-CM | POA: Diagnosis not present

## 2016-08-29 DIAGNOSIS — R52 Pain, unspecified: Secondary | ICD-10-CM | POA: Diagnosis not present

## 2016-08-29 DIAGNOSIS — M25531 Pain in right wrist: Secondary | ICD-10-CM | POA: Diagnosis not present

## 2016-08-29 DIAGNOSIS — S8992XA Unspecified injury of left lower leg, initial encounter: Secondary | ICD-10-CM

## 2016-08-30 ENCOUNTER — Ambulatory Visit
Admission: RE | Admit: 2016-08-30 | Discharge: 2016-08-30 | Disposition: A | Discharge status: Home / Self Care | Payer: 59 | Source: Ambulatory Visit | Attending: Endocrinology | Admitting: Endocrinology

## 2016-08-30 ENCOUNTER — Other Ambulatory Visit: Payer: Self-pay | Admitting: Endocrinology

## 2016-08-30 ENCOUNTER — Other Ambulatory Visit: Payer: Self-pay

## 2016-08-30 DIAGNOSIS — M25562 Pain in left knee: Secondary | ICD-10-CM

## 2016-09-07 DIAGNOSIS — M25531 Pain in right wrist: Secondary | ICD-10-CM | POA: Diagnosis not present

## 2016-09-07 DIAGNOSIS — R52 Pain, unspecified: Secondary | ICD-10-CM | POA: Diagnosis not present

## 2016-09-11 DIAGNOSIS — M25641 Stiffness of right hand, not elsewhere classified: Secondary | ICD-10-CM | POA: Diagnosis not present

## 2016-09-13 DIAGNOSIS — M25641 Stiffness of right hand, not elsewhere classified: Secondary | ICD-10-CM | POA: Diagnosis not present

## 2016-09-17 DIAGNOSIS — M25641 Stiffness of right hand, not elsewhere classified: Secondary | ICD-10-CM | POA: Diagnosis not present

## 2016-09-19 DIAGNOSIS — Z319 Encounter for procreative management, unspecified: Secondary | ICD-10-CM | POA: Diagnosis not present

## 2016-09-19 DIAGNOSIS — E282 Polycystic ovarian syndrome: Secondary | ICD-10-CM | POA: Diagnosis not present

## 2016-09-20 DIAGNOSIS — M25641 Stiffness of right hand, not elsewhere classified: Secondary | ICD-10-CM | POA: Diagnosis not present

## 2016-09-20 DIAGNOSIS — Z23 Encounter for immunization: Secondary | ICD-10-CM | POA: Diagnosis not present

## 2016-09-21 DIAGNOSIS — M79641 Pain in right hand: Secondary | ICD-10-CM | POA: Diagnosis not present

## 2016-09-26 DIAGNOSIS — N8500 Endometrial hyperplasia, unspecified: Secondary | ICD-10-CM | POA: Diagnosis not present

## 2016-09-26 DIAGNOSIS — Z319 Encounter for procreative management, unspecified: Secondary | ICD-10-CM | POA: Diagnosis not present

## 2016-09-26 DIAGNOSIS — Z3141 Encounter for fertility testing: Secondary | ICD-10-CM | POA: Diagnosis not present

## 2016-09-26 DIAGNOSIS — E282 Polycystic ovarian syndrome: Secondary | ICD-10-CM | POA: Diagnosis not present

## 2016-10-15 DIAGNOSIS — M79641 Pain in right hand: Secondary | ICD-10-CM | POA: Diagnosis not present

## 2016-10-17 DIAGNOSIS — Z3183 Encounter for assisted reproductive fertility procedure cycle: Secondary | ICD-10-CM | POA: Diagnosis not present

## 2016-10-23 DIAGNOSIS — Z3183 Encounter for assisted reproductive fertility procedure cycle: Secondary | ICD-10-CM | POA: Diagnosis not present

## 2016-10-31 DIAGNOSIS — E038 Other specified hypothyroidism: Secondary | ICD-10-CM | POA: Diagnosis not present

## 2016-10-31 DIAGNOSIS — E048 Other specified nontoxic goiter: Secondary | ICD-10-CM | POA: Diagnosis not present

## 2016-10-31 DIAGNOSIS — E119 Type 2 diabetes mellitus without complications: Secondary | ICD-10-CM | POA: Diagnosis not present

## 2016-10-31 DIAGNOSIS — Z6823 Body mass index (BMI) 23.0-23.9, adult: Secondary | ICD-10-CM | POA: Diagnosis not present

## 2016-10-31 DIAGNOSIS — Z3200 Encounter for pregnancy test, result unknown: Secondary | ICD-10-CM | POA: Diagnosis not present

## 2016-10-31 DIAGNOSIS — E784 Other hyperlipidemia: Secondary | ICD-10-CM | POA: Diagnosis not present

## 2016-11-02 DIAGNOSIS — Z3201 Encounter for pregnancy test, result positive: Secondary | ICD-10-CM | POA: Diagnosis not present

## 2016-11-05 DIAGNOSIS — O021 Missed abortion: Secondary | ICD-10-CM | POA: Diagnosis not present

## 2016-12-06 DIAGNOSIS — H5213 Myopia, bilateral: Secondary | ICD-10-CM | POA: Diagnosis not present

## 2016-12-06 DIAGNOSIS — H35411 Lattice degeneration of retina, right eye: Secondary | ICD-10-CM | POA: Diagnosis not present

## 2016-12-06 DIAGNOSIS — H52223 Regular astigmatism, bilateral: Secondary | ICD-10-CM | POA: Diagnosis not present

## 2016-12-06 DIAGNOSIS — Z7984 Long term (current) use of oral hypoglycemic drugs: Secondary | ICD-10-CM | POA: Diagnosis not present

## 2016-12-06 DIAGNOSIS — E119 Type 2 diabetes mellitus without complications: Secondary | ICD-10-CM | POA: Diagnosis not present

## 2016-12-31 DIAGNOSIS — Z3200 Encounter for pregnancy test, result unknown: Secondary | ICD-10-CM | POA: Diagnosis not present

## 2016-12-31 DIAGNOSIS — E282 Polycystic ovarian syndrome: Secondary | ICD-10-CM | POA: Diagnosis not present

## 2017-01-08 DIAGNOSIS — Z319 Encounter for procreative management, unspecified: Secondary | ICD-10-CM | POA: Diagnosis not present

## 2017-01-08 DIAGNOSIS — N912 Amenorrhea, unspecified: Secondary | ICD-10-CM | POA: Diagnosis not present

## 2017-01-23 DIAGNOSIS — Z113 Encounter for screening for infections with a predominantly sexual mode of transmission: Secondary | ICD-10-CM | POA: Diagnosis not present

## 2017-02-01 DIAGNOSIS — Z3183 Encounter for assisted reproductive fertility procedure cycle: Secondary | ICD-10-CM | POA: Diagnosis not present

## 2017-02-02 DIAGNOSIS — N883 Incompetence of cervix uteri: Secondary | ICD-10-CM | POA: Diagnosis not present

## 2017-02-02 DIAGNOSIS — E282 Polycystic ovarian syndrome: Secondary | ICD-10-CM | POA: Diagnosis not present

## 2017-02-02 DIAGNOSIS — Z3200 Encounter for pregnancy test, result unknown: Secondary | ICD-10-CM | POA: Diagnosis not present

## 2017-02-02 DIAGNOSIS — Z3183 Encounter for assisted reproductive fertility procedure cycle: Secondary | ICD-10-CM | POA: Diagnosis not present

## 2017-02-06 DIAGNOSIS — Z113 Encounter for screening for infections with a predominantly sexual mode of transmission: Secondary | ICD-10-CM | POA: Diagnosis not present

## 2017-02-06 DIAGNOSIS — E282 Polycystic ovarian syndrome: Secondary | ICD-10-CM | POA: Diagnosis not present

## 2017-02-06 DIAGNOSIS — Z3183 Encounter for assisted reproductive fertility procedure cycle: Secondary | ICD-10-CM | POA: Diagnosis not present

## 2017-02-06 DIAGNOSIS — N883 Incompetence of cervix uteri: Secondary | ICD-10-CM | POA: Diagnosis not present

## 2017-02-06 DIAGNOSIS — O3430 Maternal care for cervical incompetence, unspecified trimester: Secondary | ICD-10-CM | POA: Diagnosis not present

## 2017-02-06 DIAGNOSIS — N912 Amenorrhea, unspecified: Secondary | ICD-10-CM | POA: Diagnosis not present

## 2017-02-06 DIAGNOSIS — Z319 Encounter for procreative management, unspecified: Secondary | ICD-10-CM | POA: Diagnosis not present

## 2017-02-06 DIAGNOSIS — Z3200 Encounter for pregnancy test, result unknown: Secondary | ICD-10-CM | POA: Diagnosis not present

## 2017-02-06 DIAGNOSIS — Z3141 Encounter for fertility testing: Secondary | ICD-10-CM | POA: Diagnosis not present

## 2017-02-08 DIAGNOSIS — Z113 Encounter for screening for infections with a predominantly sexual mode of transmission: Secondary | ICD-10-CM | POA: Diagnosis not present

## 2017-02-08 DIAGNOSIS — Z3183 Encounter for assisted reproductive fertility procedure cycle: Secondary | ICD-10-CM | POA: Diagnosis not present

## 2017-02-08 DIAGNOSIS — N883 Incompetence of cervix uteri: Secondary | ICD-10-CM | POA: Diagnosis not present

## 2017-02-08 DIAGNOSIS — N912 Amenorrhea, unspecified: Secondary | ICD-10-CM | POA: Diagnosis not present

## 2017-02-08 DIAGNOSIS — E288 Other ovarian dysfunction: Secondary | ICD-10-CM | POA: Diagnosis not present

## 2017-02-08 DIAGNOSIS — Z319 Encounter for procreative management, unspecified: Secondary | ICD-10-CM | POA: Diagnosis not present

## 2017-02-08 DIAGNOSIS — O3430 Maternal care for cervical incompetence, unspecified trimester: Secondary | ICD-10-CM | POA: Diagnosis not present

## 2017-02-08 DIAGNOSIS — Z3200 Encounter for pregnancy test, result unknown: Secondary | ICD-10-CM | POA: Diagnosis not present

## 2017-02-08 DIAGNOSIS — E282 Polycystic ovarian syndrome: Secondary | ICD-10-CM | POA: Diagnosis not present

## 2017-02-08 DIAGNOSIS — Z3189 Encounter for other procreative management: Secondary | ICD-10-CM | POA: Diagnosis not present

## 2017-02-09 DIAGNOSIS — Z3183 Encounter for assisted reproductive fertility procedure cycle: Secondary | ICD-10-CM | POA: Diagnosis not present

## 2017-02-11 DIAGNOSIS — E282 Polycystic ovarian syndrome: Secondary | ICD-10-CM | POA: Diagnosis not present

## 2017-02-11 DIAGNOSIS — Z3183 Encounter for assisted reproductive fertility procedure cycle: Secondary | ICD-10-CM | POA: Diagnosis not present

## 2017-04-19 DIAGNOSIS — E038 Other specified hypothyroidism: Secondary | ICD-10-CM | POA: Diagnosis not present

## 2017-04-19 DIAGNOSIS — E784 Other hyperlipidemia: Secondary | ICD-10-CM | POA: Diagnosis not present

## 2017-04-19 DIAGNOSIS — Z6824 Body mass index (BMI) 24.0-24.9, adult: Secondary | ICD-10-CM | POA: Diagnosis not present

## 2017-04-19 DIAGNOSIS — E119 Type 2 diabetes mellitus without complications: Secondary | ICD-10-CM | POA: Diagnosis not present

## 2017-04-19 DIAGNOSIS — E048 Other specified nontoxic goiter: Secondary | ICD-10-CM | POA: Diagnosis not present

## 2017-04-19 DIAGNOSIS — Z1389 Encounter for screening for other disorder: Secondary | ICD-10-CM | POA: Diagnosis not present

## 2017-05-13 DIAGNOSIS — Z319 Encounter for procreative management, unspecified: Secondary | ICD-10-CM | POA: Diagnosis not present

## 2017-05-13 DIAGNOSIS — E282 Polycystic ovarian syndrome: Secondary | ICD-10-CM | POA: Diagnosis not present

## 2017-05-13 DIAGNOSIS — Z3200 Encounter for pregnancy test, result unknown: Secondary | ICD-10-CM | POA: Diagnosis not present

## 2017-05-14 DIAGNOSIS — M9901 Segmental and somatic dysfunction of cervical region: Secondary | ICD-10-CM | POA: Diagnosis not present

## 2017-05-14 DIAGNOSIS — M5441 Lumbago with sciatica, right side: Secondary | ICD-10-CM | POA: Diagnosis not present

## 2017-05-14 DIAGNOSIS — M5412 Radiculopathy, cervical region: Secondary | ICD-10-CM | POA: Diagnosis not present

## 2017-05-14 DIAGNOSIS — M9903 Segmental and somatic dysfunction of lumbar region: Secondary | ICD-10-CM | POA: Diagnosis not present

## 2017-05-15 DIAGNOSIS — M5412 Radiculopathy, cervical region: Secondary | ICD-10-CM | POA: Diagnosis not present

## 2017-05-15 DIAGNOSIS — M5441 Lumbago with sciatica, right side: Secondary | ICD-10-CM | POA: Diagnosis not present

## 2017-05-15 DIAGNOSIS — M9901 Segmental and somatic dysfunction of cervical region: Secondary | ICD-10-CM | POA: Diagnosis not present

## 2017-05-15 DIAGNOSIS — M9903 Segmental and somatic dysfunction of lumbar region: Secondary | ICD-10-CM | POA: Diagnosis not present

## 2017-05-16 DIAGNOSIS — M5412 Radiculopathy, cervical region: Secondary | ICD-10-CM | POA: Diagnosis not present

## 2017-05-16 DIAGNOSIS — M9901 Segmental and somatic dysfunction of cervical region: Secondary | ICD-10-CM | POA: Diagnosis not present

## 2017-05-16 DIAGNOSIS — M9903 Segmental and somatic dysfunction of lumbar region: Secondary | ICD-10-CM | POA: Diagnosis not present

## 2017-05-16 DIAGNOSIS — M5441 Lumbago with sciatica, right side: Secondary | ICD-10-CM | POA: Diagnosis not present

## 2017-05-20 DIAGNOSIS — M9903 Segmental and somatic dysfunction of lumbar region: Secondary | ICD-10-CM | POA: Diagnosis not present

## 2017-05-20 DIAGNOSIS — M9901 Segmental and somatic dysfunction of cervical region: Secondary | ICD-10-CM | POA: Diagnosis not present

## 2017-05-20 DIAGNOSIS — M5441 Lumbago with sciatica, right side: Secondary | ICD-10-CM | POA: Diagnosis not present

## 2017-05-20 DIAGNOSIS — M5412 Radiculopathy, cervical region: Secondary | ICD-10-CM | POA: Diagnosis not present

## 2017-05-21 DIAGNOSIS — M5441 Lumbago with sciatica, right side: Secondary | ICD-10-CM | POA: Diagnosis not present

## 2017-05-21 DIAGNOSIS — M9903 Segmental and somatic dysfunction of lumbar region: Secondary | ICD-10-CM | POA: Diagnosis not present

## 2017-05-21 DIAGNOSIS — M5412 Radiculopathy, cervical region: Secondary | ICD-10-CM | POA: Diagnosis not present

## 2017-05-21 DIAGNOSIS — M9901 Segmental and somatic dysfunction of cervical region: Secondary | ICD-10-CM | POA: Diagnosis not present

## 2017-05-22 DIAGNOSIS — Z3183 Encounter for assisted reproductive fertility procedure cycle: Secondary | ICD-10-CM | POA: Diagnosis not present

## 2017-05-22 DIAGNOSIS — Z319 Encounter for procreative management, unspecified: Secondary | ICD-10-CM | POA: Diagnosis not present

## 2017-05-27 DIAGNOSIS — Z3183 Encounter for assisted reproductive fertility procedure cycle: Secondary | ICD-10-CM | POA: Diagnosis not present

## 2017-05-27 DIAGNOSIS — Z3200 Encounter for pregnancy test, result unknown: Secondary | ICD-10-CM | POA: Diagnosis not present

## 2017-05-27 DIAGNOSIS — Z113 Encounter for screening for infections with a predominantly sexual mode of transmission: Secondary | ICD-10-CM | POA: Diagnosis not present

## 2017-05-27 DIAGNOSIS — E282 Polycystic ovarian syndrome: Secondary | ICD-10-CM | POA: Diagnosis not present

## 2017-05-27 DIAGNOSIS — N883 Incompetence of cervix uteri: Secondary | ICD-10-CM | POA: Diagnosis not present

## 2017-05-28 DIAGNOSIS — M9901 Segmental and somatic dysfunction of cervical region: Secondary | ICD-10-CM | POA: Diagnosis not present

## 2017-05-28 DIAGNOSIS — M9903 Segmental and somatic dysfunction of lumbar region: Secondary | ICD-10-CM | POA: Diagnosis not present

## 2017-05-28 DIAGNOSIS — M5441 Lumbago with sciatica, right side: Secondary | ICD-10-CM | POA: Diagnosis not present

## 2017-05-28 DIAGNOSIS — M5412 Radiculopathy, cervical region: Secondary | ICD-10-CM | POA: Diagnosis not present

## 2017-05-29 DIAGNOSIS — M9903 Segmental and somatic dysfunction of lumbar region: Secondary | ICD-10-CM | POA: Diagnosis not present

## 2017-05-29 DIAGNOSIS — E282 Polycystic ovarian syndrome: Secondary | ICD-10-CM | POA: Diagnosis not present

## 2017-05-29 DIAGNOSIS — M9901 Segmental and somatic dysfunction of cervical region: Secondary | ICD-10-CM | POA: Diagnosis not present

## 2017-05-29 DIAGNOSIS — M5412 Radiculopathy, cervical region: Secondary | ICD-10-CM | POA: Diagnosis not present

## 2017-05-29 DIAGNOSIS — M5441 Lumbago with sciatica, right side: Secondary | ICD-10-CM | POA: Diagnosis not present

## 2017-05-30 DIAGNOSIS — Z3183 Encounter for assisted reproductive fertility procedure cycle: Secondary | ICD-10-CM | POA: Diagnosis not present

## 2017-06-03 DIAGNOSIS — E282 Polycystic ovarian syndrome: Secondary | ICD-10-CM | POA: Diagnosis not present

## 2017-06-03 DIAGNOSIS — Z3183 Encounter for assisted reproductive fertility procedure cycle: Secondary | ICD-10-CM | POA: Diagnosis not present

## 2017-06-05 DIAGNOSIS — Z3183 Encounter for assisted reproductive fertility procedure cycle: Secondary | ICD-10-CM | POA: Diagnosis not present

## 2017-06-05 DIAGNOSIS — E282 Polycystic ovarian syndrome: Secondary | ICD-10-CM | POA: Diagnosis not present

## 2017-06-06 DIAGNOSIS — Z3183 Encounter for assisted reproductive fertility procedure cycle: Secondary | ICD-10-CM | POA: Diagnosis not present

## 2017-06-06 DIAGNOSIS — E282 Polycystic ovarian syndrome: Secondary | ICD-10-CM | POA: Diagnosis not present

## 2017-06-06 DIAGNOSIS — Z3200 Encounter for pregnancy test, result unknown: Secondary | ICD-10-CM | POA: Diagnosis not present

## 2017-06-06 DIAGNOSIS — N883 Incompetence of cervix uteri: Secondary | ICD-10-CM | POA: Diagnosis not present

## 2017-06-06 DIAGNOSIS — Z113 Encounter for screening for infections with a predominantly sexual mode of transmission: Secondary | ICD-10-CM | POA: Diagnosis not present

## 2017-06-08 DIAGNOSIS — Z3183 Encounter for assisted reproductive fertility procedure cycle: Secondary | ICD-10-CM | POA: Diagnosis not present

## 2017-06-08 DIAGNOSIS — N8500 Endometrial hyperplasia, unspecified: Secondary | ICD-10-CM | POA: Diagnosis not present

## 2017-06-13 DIAGNOSIS — Z3183 Encounter for assisted reproductive fertility procedure cycle: Secondary | ICD-10-CM | POA: Diagnosis not present

## 2017-06-17 DIAGNOSIS — M9901 Segmental and somatic dysfunction of cervical region: Secondary | ICD-10-CM | POA: Diagnosis not present

## 2017-06-17 DIAGNOSIS — M5441 Lumbago with sciatica, right side: Secondary | ICD-10-CM | POA: Diagnosis not present

## 2017-06-17 DIAGNOSIS — M5412 Radiculopathy, cervical region: Secondary | ICD-10-CM | POA: Diagnosis not present

## 2017-06-17 DIAGNOSIS — M9903 Segmental and somatic dysfunction of lumbar region: Secondary | ICD-10-CM | POA: Diagnosis not present

## 2017-06-18 DIAGNOSIS — M9901 Segmental and somatic dysfunction of cervical region: Secondary | ICD-10-CM | POA: Diagnosis not present

## 2017-06-18 DIAGNOSIS — M9903 Segmental and somatic dysfunction of lumbar region: Secondary | ICD-10-CM | POA: Diagnosis not present

## 2017-06-18 DIAGNOSIS — M5441 Lumbago with sciatica, right side: Secondary | ICD-10-CM | POA: Diagnosis not present

## 2017-06-18 DIAGNOSIS — M5412 Radiculopathy, cervical region: Secondary | ICD-10-CM | POA: Diagnosis not present

## 2017-06-19 DIAGNOSIS — M9901 Segmental and somatic dysfunction of cervical region: Secondary | ICD-10-CM | POA: Diagnosis not present

## 2017-06-19 DIAGNOSIS — M5441 Lumbago with sciatica, right side: Secondary | ICD-10-CM | POA: Diagnosis not present

## 2017-06-19 DIAGNOSIS — M5412 Radiculopathy, cervical region: Secondary | ICD-10-CM | POA: Diagnosis not present

## 2017-06-19 DIAGNOSIS — M9903 Segmental and somatic dysfunction of lumbar region: Secondary | ICD-10-CM | POA: Diagnosis not present

## 2017-06-24 DIAGNOSIS — M5412 Radiculopathy, cervical region: Secondary | ICD-10-CM | POA: Diagnosis not present

## 2017-06-24 DIAGNOSIS — M9901 Segmental and somatic dysfunction of cervical region: Secondary | ICD-10-CM | POA: Diagnosis not present

## 2017-06-24 DIAGNOSIS — M5441 Lumbago with sciatica, right side: Secondary | ICD-10-CM | POA: Diagnosis not present

## 2017-06-24 DIAGNOSIS — M9903 Segmental and somatic dysfunction of lumbar region: Secondary | ICD-10-CM | POA: Diagnosis not present

## 2017-06-26 DIAGNOSIS — M5441 Lumbago with sciatica, right side: Secondary | ICD-10-CM | POA: Diagnosis not present

## 2017-06-26 DIAGNOSIS — M5412 Radiculopathy, cervical region: Secondary | ICD-10-CM | POA: Diagnosis not present

## 2017-06-26 DIAGNOSIS — M9903 Segmental and somatic dysfunction of lumbar region: Secondary | ICD-10-CM | POA: Diagnosis not present

## 2017-06-26 DIAGNOSIS — M9901 Segmental and somatic dysfunction of cervical region: Secondary | ICD-10-CM | POA: Diagnosis not present

## 2017-07-01 DIAGNOSIS — M5441 Lumbago with sciatica, right side: Secondary | ICD-10-CM | POA: Diagnosis not present

## 2017-07-01 DIAGNOSIS — M5412 Radiculopathy, cervical region: Secondary | ICD-10-CM | POA: Diagnosis not present

## 2017-07-01 DIAGNOSIS — M9901 Segmental and somatic dysfunction of cervical region: Secondary | ICD-10-CM | POA: Diagnosis not present

## 2017-07-01 DIAGNOSIS — M9903 Segmental and somatic dysfunction of lumbar region: Secondary | ICD-10-CM | POA: Diagnosis not present

## 2017-07-03 DIAGNOSIS — E282 Polycystic ovarian syndrome: Secondary | ICD-10-CM | POA: Diagnosis not present

## 2017-07-03 DIAGNOSIS — Z3183 Encounter for assisted reproductive fertility procedure cycle: Secondary | ICD-10-CM | POA: Diagnosis not present

## 2017-07-11 DIAGNOSIS — Z3183 Encounter for assisted reproductive fertility procedure cycle: Secondary | ICD-10-CM | POA: Diagnosis not present

## 2017-07-19 DIAGNOSIS — Z3200 Encounter for pregnancy test, result unknown: Secondary | ICD-10-CM | POA: Diagnosis not present

## 2017-07-22 DIAGNOSIS — Z3200 Encounter for pregnancy test, result unknown: Secondary | ICD-10-CM | POA: Diagnosis not present

## 2017-07-24 DIAGNOSIS — Z3200 Encounter for pregnancy test, result unknown: Secondary | ICD-10-CM | POA: Diagnosis not present

## 2017-07-26 DIAGNOSIS — Z3201 Encounter for pregnancy test, result positive: Secondary | ICD-10-CM | POA: Diagnosis not present

## 2017-08-02 DIAGNOSIS — Z3183 Encounter for assisted reproductive fertility procedure cycle: Secondary | ICD-10-CM | POA: Diagnosis not present

## 2017-08-02 DIAGNOSIS — Z3200 Encounter for pregnancy test, result unknown: Secondary | ICD-10-CM | POA: Diagnosis not present

## 2017-08-02 DIAGNOSIS — O0281 Inappropriate change in quantitative human chorionic gonadotropin (hCG) in early pregnancy: Secondary | ICD-10-CM | POA: Diagnosis not present

## 2017-08-07 DIAGNOSIS — O0281 Inappropriate change in quantitative human chorionic gonadotropin (hCG) in early pregnancy: Secondary | ICD-10-CM | POA: Diagnosis not present

## 2017-08-07 DIAGNOSIS — Z3200 Encounter for pregnancy test, result unknown: Secondary | ICD-10-CM | POA: Diagnosis not present

## 2017-08-19 DIAGNOSIS — E784 Other hyperlipidemia: Secondary | ICD-10-CM | POA: Diagnosis not present

## 2017-08-19 DIAGNOSIS — E048 Other specified nontoxic goiter: Secondary | ICD-10-CM | POA: Diagnosis not present

## 2017-08-19 DIAGNOSIS — Z331 Pregnant state, incidental: Secondary | ICD-10-CM | POA: Diagnosis not present

## 2017-08-19 DIAGNOSIS — E038 Other specified hypothyroidism: Secondary | ICD-10-CM | POA: Diagnosis not present

## 2017-08-19 DIAGNOSIS — Z6824 Body mass index (BMI) 24.0-24.9, adult: Secondary | ICD-10-CM | POA: Diagnosis not present

## 2017-08-19 DIAGNOSIS — E119 Type 2 diabetes mellitus without complications: Secondary | ICD-10-CM | POA: Diagnosis not present

## 2017-08-19 DIAGNOSIS — Z1389 Encounter for screening for other disorder: Secondary | ICD-10-CM | POA: Diagnosis not present

## 2017-08-22 DIAGNOSIS — O0900 Supervision of pregnancy with history of infertility, unspecified trimester: Secondary | ICD-10-CM | POA: Diagnosis not present

## 2017-09-05 DIAGNOSIS — Z118 Encounter for screening for other infectious and parasitic diseases: Secondary | ICD-10-CM | POA: Diagnosis not present

## 2017-09-05 DIAGNOSIS — O09811 Supervision of pregnancy resulting from assisted reproductive technology, first trimester: Secondary | ICD-10-CM | POA: Diagnosis not present

## 2017-09-05 DIAGNOSIS — O24119 Pre-existing diabetes mellitus, type 2, in pregnancy, unspecified trimester: Secondary | ICD-10-CM | POA: Diagnosis not present

## 2017-09-05 DIAGNOSIS — Z3689 Encounter for other specified antenatal screening: Secondary | ICD-10-CM | POA: Diagnosis not present

## 2017-09-05 LAB — OB RESULTS CONSOLE RPR: RPR: NONREACTIVE

## 2017-09-05 LAB — OB RESULTS CONSOLE ABO/RH: RH Type: POSITIVE

## 2017-09-05 LAB — OB RESULTS CONSOLE ANTIBODY SCREEN: Antibody Screen: NEGATIVE

## 2017-09-05 LAB — OB RESULTS CONSOLE RUBELLA ANTIBODY, IGM: Rubella: IMMUNE

## 2017-09-05 LAB — OB RESULTS CONSOLE HIV ANTIBODY (ROUTINE TESTING): HIV: NONREACTIVE

## 2017-09-05 LAB — OB RESULTS CONSOLE GC/CHLAMYDIA
Chlamydia: NEGATIVE
GC PROBE AMP, GENITAL: NEGATIVE

## 2017-09-05 LAB — OB RESULTS CONSOLE HEPATITIS B SURFACE ANTIGEN: Hepatitis B Surface Ag: NEGATIVE

## 2017-09-09 DIAGNOSIS — O24119 Pre-existing diabetes mellitus, type 2, in pregnancy, unspecified trimester: Secondary | ICD-10-CM | POA: Diagnosis not present

## 2017-09-09 DIAGNOSIS — Z3A11 11 weeks gestation of pregnancy: Secondary | ICD-10-CM | POA: Diagnosis not present

## 2017-09-20 DIAGNOSIS — N390 Urinary tract infection, site not specified: Secondary | ICD-10-CM | POA: Diagnosis not present

## 2017-09-20 DIAGNOSIS — Z3682 Encounter for antenatal screening for nuchal translucency: Secondary | ICD-10-CM | POA: Diagnosis not present

## 2017-10-11 ENCOUNTER — Encounter (HOSPITAL_COMMUNITY): Payer: Self-pay | Admitting: *Deleted

## 2017-10-11 ENCOUNTER — Inpatient Hospital Stay (HOSPITAL_COMMUNITY)
Admission: AD | Admit: 2017-10-11 | Discharge: 2017-10-11 | Disposition: A | Discharge status: Home / Self Care | Payer: 59 | Source: Ambulatory Visit | Attending: Obstetrics & Gynecology | Admitting: Obstetrics & Gynecology

## 2017-10-11 DIAGNOSIS — O26892 Other specified pregnancy related conditions, second trimester: Secondary | ICD-10-CM | POA: Diagnosis not present

## 2017-10-11 DIAGNOSIS — O219 Vomiting of pregnancy, unspecified: Secondary | ICD-10-CM | POA: Insufficient documentation

## 2017-10-11 DIAGNOSIS — O09522 Supervision of elderly multigravida, second trimester: Secondary | ICD-10-CM | POA: Insufficient documentation

## 2017-10-11 DIAGNOSIS — Z3A16 16 weeks gestation of pregnancy: Secondary | ICD-10-CM | POA: Insufficient documentation

## 2017-10-11 DIAGNOSIS — R519 Headache, unspecified: Secondary | ICD-10-CM

## 2017-10-11 DIAGNOSIS — E119 Type 2 diabetes mellitus without complications: Secondary | ICD-10-CM | POA: Diagnosis not present

## 2017-10-11 DIAGNOSIS — O24119 Pre-existing diabetes mellitus, type 2, in pregnancy, unspecified trimester: Secondary | ICD-10-CM | POA: Diagnosis not present

## 2017-10-11 DIAGNOSIS — O99282 Endocrine, nutritional and metabolic diseases complicating pregnancy, second trimester: Secondary | ICD-10-CM | POA: Diagnosis not present

## 2017-10-11 DIAGNOSIS — O34219 Maternal care for unspecified type scar from previous cesarean delivery: Secondary | ICD-10-CM | POA: Diagnosis not present

## 2017-10-11 DIAGNOSIS — O24112 Pre-existing diabetes mellitus, type 2, in pregnancy, second trimester: Secondary | ICD-10-CM | POA: Diagnosis not present

## 2017-10-11 DIAGNOSIS — E860 Dehydration: Secondary | ICD-10-CM | POA: Diagnosis not present

## 2017-10-11 DIAGNOSIS — Z87440 Personal history of urinary (tract) infections: Secondary | ICD-10-CM | POA: Insufficient documentation

## 2017-10-11 DIAGNOSIS — R1110 Vomiting, unspecified: Secondary | ICD-10-CM | POA: Diagnosis present

## 2017-10-11 DIAGNOSIS — R51 Headache: Secondary | ICD-10-CM | POA: Diagnosis not present

## 2017-10-11 LAB — CBC
HEMATOCRIT: 35.1 % — AB (ref 36.0–46.0)
HEMOGLOBIN: 12.4 g/dL (ref 12.0–15.0)
MCH: 27.4 pg (ref 26.0–34.0)
MCHC: 35.3 g/dL (ref 30.0–36.0)
MCV: 77.7 fL — AB (ref 78.0–100.0)
Platelets: 256 10*3/uL (ref 150–400)
RBC: 4.52 MIL/uL (ref 3.87–5.11)
RDW: 13.6 % (ref 11.5–15.5)
WBC: 7.7 10*3/uL (ref 4.0–10.5)

## 2017-10-11 LAB — URINALYSIS, ROUTINE W REFLEX MICROSCOPIC
Bilirubin Urine: NEGATIVE
Glucose, UA: NEGATIVE mg/dL
HGB URINE DIPSTICK: NEGATIVE
Ketones, ur: 20 mg/dL — AB
LEUKOCYTES UA: NEGATIVE
Nitrite: NEGATIVE
PROTEIN: NEGATIVE mg/dL
Specific Gravity, Urine: 1.003 — ABNORMAL LOW (ref 1.005–1.030)
pH: 7 (ref 5.0–8.0)

## 2017-10-11 LAB — COMPREHENSIVE METABOLIC PANEL
ALBUMIN: 3.7 g/dL (ref 3.5–5.0)
ALK PHOS: 33 U/L — AB (ref 38–126)
ALT: 17 U/L (ref 14–54)
AST: 20 U/L (ref 15–41)
Anion gap: 13 (ref 5–15)
BILIRUBIN TOTAL: 0.7 mg/dL (ref 0.3–1.2)
CO2: 23 mmol/L (ref 22–32)
CREATININE: 0.4 mg/dL — AB (ref 0.44–1.00)
Calcium: 8.6 mg/dL — ABNORMAL LOW (ref 8.9–10.3)
Chloride: 99 mmol/L — ABNORMAL LOW (ref 101–111)
GFR calc Af Amer: >60 mL/min (ref >60)
GLUCOSE: 121 mg/dL — AB (ref 65–99)
Potassium: 3.5 mmol/L (ref 3.5–5.1)
Sodium: 135 mmol/L (ref 135–145)
TOTAL PROTEIN: 6.6 g/dL (ref 6.5–8.1)

## 2017-10-11 MED ORDER — LACTATED RINGERS IV BOLUS (SEPSIS)
1000.0000 mL | Freq: Once | INTRAVENOUS | Status: AC
  Administered 2017-10-11: 1000 mL via INTRAVENOUS

## 2017-10-11 MED ORDER — DIPHENHYDRAMINE HCL 50 MG/ML IJ SOLN
12.5000 mg | Freq: Once | INTRAMUSCULAR | Status: AC
  Administered 2017-10-11: 12.5 mg via INTRAVENOUS
  Filled 2017-10-11: qty 1

## 2017-10-11 MED ORDER — METOCLOPRAMIDE HCL 5 MG/ML IJ SOLN
10.0000 mg | Freq: Once | INTRAMUSCULAR | Status: AC
  Administered 2017-10-11: 10 mg via INTRAVENOUS
  Filled 2017-10-11: qty 2

## 2017-10-11 MED ORDER — M.V.I. ADULT IV INJ
Freq: Once | INTRAVENOUS | Status: AC
  Administered 2017-10-11: 13:00:00 via INTRAVENOUS
  Filled 2017-10-11: qty 10

## 2017-10-11 NOTE — MAU Note (Signed)
Pt sent from MD office, C/O vomiting since yesterday morning, denies diarrhea.  Denies abd pain or bleeding, has HA.

## 2017-10-11 NOTE — Progress Notes (Signed)
Patient states she is unable to provide urine sample at this time.  Wheelchair outside of room.  Patient and husband instructed to let RN know when able to use restroom.

## 2017-10-11 NOTE — MAU Provider Note (Signed)
Chief Complaint: Emesis During Pregnancy   First Provider Initiated Contact with Patient 10/11/17 1132      SUBJECTIVE HPI: Allison Reed is a 41 y.o. [id number] at [redacted]w[redacted]d by LMP who presents to maternity admissions reporting n/v x 2 weeks with worsening in last 2 days and onset of h/a.  She was seen in the office today and sent to MAU for IV fluids/headache management.  She reports she has not kept down any food/fluids in 24 hours.  She is weak, tired, and reports her h/a is frontal, constant dull pain.  There are no other associated symptoms. She denies abdominal pain, vaginal bleeding, vaginal itching/burning, urinary symptoms, or fever/chills.     HPI  Past Medical History:  Diagnosis Date  . Diabetes mellitus without complication (HCC)    Type 2  . Headache    otc med prn  . Heartburn in pregnancy   . 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 Missed abortion    no surgery required  . Postpartum care following cesarean delivery (11/2) 10/12/2015  . UTI in pregnancy 03/2015   recent UTI, treatment completed   Past Surgical History:  Procedure Laterality Date  . APPENDECTOMY  2006  . CERCLAGE LAPAROSCOPIC ABDOMINAL N/A 04/15/2015   Procedure: LAPAROSCOPIC TRANSABDOMINAL CERVCOISTHMIC CERCLAGE;  Surgeon: Governor Specking, MD;  Location: [facility];  Service: Gynecology;  Laterality: N/A;  . CERVICAL CERCLAGE  02/2011   vaginal  . CESAREAN SECTION N/A 10/12/2015   Procedure: CESAREAN SECTION;  Surgeon: Princess Bruins, MD;  Location: [facility];  Service: Obstetrics;  Laterality: N/A;  . WISDOM TOOTH EXTRACTION  2001   Social History   Social History  . Marital status: Married    Spouse name: N/A  . Number of children: N/A  . Years of education: N/A   Occupational History  . Not on file.   Social History Main Topics  . Smoking status: Never Smoker  . Smokeless tobacco: Never Used  . Alcohol use Yes     Comment: social  . Drug use: No  . Sexual activity: Yes    Birth control/  protection: None     Comment: pregnant - approx 11wks 5/7 per patient on 04/14/15   Other Topics Concern  . Not on file   Social History Narrative  . No narrative on file   No current facility-administered medications on file prior to encounter.    Current Outpatient Prescriptions on File Prior to Encounter  Medication Sig Dispense Refill  . Prenatal Vit-Fe Fumarate-FA (PRENATAL MULTIVITAMIN) TABS tablet Take 1 tablet by mouth daily at 12 noon.     Allergies  Allergen Reactions  . Betadine [Povidone Iodine] Rash    ROS:  Review of Systems  Constitutional: Positive for appetite change and fatigue. Negative for chills and fever.  Respiratory: Negative for shortness of breath.   Cardiovascular: Negative for chest pain.  Gastrointestinal: Positive for nausea and vomiting. Negative for constipation and diarrhea.  Genitourinary: Negative for difficulty urinating, dysuria, flank pain, pelvic pain, vaginal bleeding, vaginal discharge and vaginal pain.  Neurological: Positive for weakness and headaches. Negative for dizziness.  Psychiatric/Behavioral: Negative.      I have reviewed patient's Past Medical Hx, Surgical Hx, Family Hx, Social Hx, medications and allergies.   Physical Exam   Patient Vitals for the past 24 hrs:  BP Temp Temp src Pulse Resp  10/11/17 1422 110/75 98.1 F (36.7 C) Oral 94 16  10/11/17 1045 117/71 (!) 97.5 F (36.4 C) Oral 90 18  Constitutional: Well-developed, well-nourished female in moderate distress.  Cardiovascular: normal rate Respiratory: normal effort GI: Abd soft, non-tender. Pos BS x 4 MS: Extremities nontender, no edema, normal ROM Neurologic: Alert and oriented x 4.  GU: Neg CVAT.   FHT 160 by doppler  LAB RESULTS Results for orders placed or performed during the hospital encounter of 10/11/17 (from the past 24 hour(s))  CBC     Status: Abnormal   Collection Time: 10/11/17 11:50 AM  Result Value Ref Range   WBC 7.7 4.0 - 10.5 K/uL    RBC 4.52 3.87 - 5.11 MIL/uL   Hemoglobin 12.4 12.0 - 15.0 g/dL   HCT 35.1 (L) 36.0 - 46.0 %   MCV 77.7 (L) 78.0 - 100.0 fL   MCH 27.4 26.0 - 34.0 pg   MCHC 35.3 30.0 - 36.0 g/dL   RDW 13.6 11.5 - 15.5 %   Platelets 256 150 - 400 K/uL  Comprehensive metabolic panel     Status: Abnormal   Collection Time: 10/11/17 11:50 AM  Result Value Ref Range   Sodium 135 135 - 145 mmol/L   Potassium 3.5 3.5 - 5.1 mmol/L   Chloride 99 (L) 101 - 111 mmol/L   CO2 23 22 - 32 mmol/L   Glucose, Bld 121 (H) 65 - 99 mg/dL   BUN <5 (L) 6 - 20 mg/dL   Creatinine, Ser 0.40 (L) 0.44 - 1.00 mg/dL   Calcium 8.6 (L) 8.9 - 10.3 mg/dL   Total Protein 6.6 6.5 - 8.1 g/dL   Albumin 3.7 3.5 - 5.0 g/dL   AST 20 15 - 41 U/L   ALT 17 14 - 54 U/L   Alkaline Phosphatase 33 (L) 38 - 126 U/L   Total Bilirubin 0.7 0.3 - 1.2 mg/dL   GFR calc non Af Amer >60 >60 mL/min   GFR calc Af Amer >60 >60 mL/min   Anion gap 13 5 - 15  Urinalysis, Routine w reflex microscopic     Status: Abnormal   Collection Time: 10/11/17  1:44 PM  Result Value Ref Range   Color, Urine YELLOW YELLOW   APPearance CLEAR CLEAR   Specific Gravity, Urine 1.003 (L) 1.005 - 1.030   pH 7.0 5.0 - 8.0   Glucose, UA NEGATIVE NEGATIVE mg/dL   Hgb urine dipstick NEGATIVE NEGATIVE   Bilirubin Urine NEGATIVE NEGATIVE   Ketones, ur 20 (A) NEGATIVE mg/dL   Protein, ur NEGATIVE NEGATIVE mg/dL   Nitrite NEGATIVE NEGATIVE   Leukocytes, UA NEGATIVE NEGATIVE       IMAGING No results found.  MAU Management/MDM: Ordered labs and reviewed results.  CBC, CMP wnl.  LR x 1000 ml, MVI in LR x 1000 ml, Reglan 10 mg IV, and Benadryl 12.5 mg IV.  Pt declined decadron IV to complete headache cocktail due to DM.  Pt feeling much better after fluid replacement and medications.  Consult Dr Ronita Hipps with assessment and findings.  D/C home with Rx prescribed by Dr Benjie Karvonen today, Zofran and Fioricet PRN.  Pt discharged with strict vomiting/headache  precautions.  ASSESSMENT 1. Nausea and vomiting during pregnancy prior to [redacted] weeks gestation   2. Mild dehydration   3. Headache in pregnancy, antepartum, second trimester     PLAN Discharge home Allergies as of 10/11/2017      Reactions   Betadine [povidone Iodine] Rash      Medication List    TAKE these medications   acetaminophen 500 MG tablet Commonly known as:  TYLENOL Take  500 mg by mouth every 6 (six) hours as needed for mild pain or headache.   metFORMIN 1000 MG tablet Commonly known as:  GLUCOPHAGE Take 1,000 mg by mouth 2 (two) times daily. Morning and evening   metFORMIN 500 MG tablet Commonly known as:  GLUCOPHAGE Take 500 mg by mouth daily. In the afternoon in addition to 1000mg    prenatal multivitamin Tabs tablet Take 1 tablet by mouth daily at 12 noon.      Follow-up Information    Azucena Fallen, MD Follow up.   Specialty:  Obstetrics and Gynecology Why:  As scheduled, return to MAU as needed for emergencies Contact information: [place] [address] [phone]           Fatima Blank Certified Nurse-Midwife 10/11/2017  4:31 PM

## 2017-11-05 DIAGNOSIS — O24119 Pre-existing diabetes mellitus, type 2, in pregnancy, unspecified trimester: Secondary | ICD-10-CM | POA: Diagnosis not present

## 2017-11-05 DIAGNOSIS — Z3A19 19 weeks gestation of pregnancy: Secondary | ICD-10-CM | POA: Diagnosis not present

## 2017-11-05 DIAGNOSIS — O09522 Supervision of elderly multigravida, second trimester: Secondary | ICD-10-CM | POA: Diagnosis not present

## 2017-11-05 DIAGNOSIS — Z363 Encounter for antenatal screening for malformations: Secondary | ICD-10-CM | POA: Diagnosis not present

## 2017-11-05 DIAGNOSIS — Z361 Encounter for antenatal screening for raised alphafetoprotein level: Secondary | ICD-10-CM | POA: Diagnosis not present

## 2017-11-07 DIAGNOSIS — O24112 Pre-existing diabetes mellitus, type 2, in pregnancy, second trimester: Secondary | ICD-10-CM | POA: Diagnosis not present

## 2017-11-07 DIAGNOSIS — O09522 Supervision of elderly multigravida, second trimester: Secondary | ICD-10-CM | POA: Diagnosis not present

## 2017-11-07 DIAGNOSIS — O358XX0 Maternal care for other (suspected) fetal abnormality and damage, not applicable or unspecified: Secondary | ICD-10-CM | POA: Diagnosis not present

## 2017-11-07 DIAGNOSIS — O09812 Supervision of pregnancy resulting from assisted reproductive technology, second trimester: Secondary | ICD-10-CM | POA: Diagnosis not present

## 2017-11-25 DIAGNOSIS — Z6824 Body mass index (BMI) 24.0-24.9, adult: Secondary | ICD-10-CM | POA: Diagnosis not present

## 2017-11-25 DIAGNOSIS — E038 Other specified hypothyroidism: Secondary | ICD-10-CM | POA: Diagnosis not present

## 2017-11-25 DIAGNOSIS — Z331 Pregnant state, incidental: Secondary | ICD-10-CM | POA: Diagnosis not present

## 2017-11-25 DIAGNOSIS — E048 Other specified nontoxic goiter: Secondary | ICD-10-CM | POA: Diagnosis not present

## 2017-11-25 DIAGNOSIS — E119 Type 2 diabetes mellitus without complications: Secondary | ICD-10-CM | POA: Diagnosis not present

## 2017-11-26 DIAGNOSIS — O24119 Pre-existing diabetes mellitus, type 2, in pregnancy, unspecified trimester: Secondary | ICD-10-CM | POA: Diagnosis not present

## 2017-11-26 DIAGNOSIS — Z3A22 22 weeks gestation of pregnancy: Secondary | ICD-10-CM | POA: Diagnosis not present

## 2017-11-26 DIAGNOSIS — N898 Other specified noninflammatory disorders of vagina: Secondary | ICD-10-CM | POA: Diagnosis not present

## 2017-11-26 DIAGNOSIS — O09522 Supervision of elderly multigravida, second trimester: Secondary | ICD-10-CM | POA: Diagnosis not present

## 2017-11-26 DIAGNOSIS — Z3480 Encounter for supervision of other normal pregnancy, unspecified trimester: Secondary | ICD-10-CM | POA: Diagnosis not present

## 2017-11-26 DIAGNOSIS — R300 Dysuria: Secondary | ICD-10-CM | POA: Diagnosis not present

## 2017-11-28 DIAGNOSIS — E119 Type 2 diabetes mellitus without complications: Secondary | ICD-10-CM | POA: Diagnosis not present

## 2017-11-28 DIAGNOSIS — Z6824 Body mass index (BMI) 24.0-24.9, adult: Secondary | ICD-10-CM | POA: Diagnosis not present

## 2017-11-28 DIAGNOSIS — Z331 Pregnant state, incidental: Secondary | ICD-10-CM | POA: Diagnosis not present

## 2017-12-09 DIAGNOSIS — O24112 Pre-existing diabetes mellitus, type 2, in pregnancy, second trimester: Secondary | ICD-10-CM | POA: Diagnosis not present

## 2017-12-09 DIAGNOSIS — O358XX0 Maternal care for other (suspected) fetal abnormality and damage, not applicable or unspecified: Secondary | ICD-10-CM | POA: Diagnosis not present

## 2017-12-09 DIAGNOSIS — O09812 Supervision of pregnancy resulting from assisted reproductive technology, second trimester: Secondary | ICD-10-CM | POA: Diagnosis not present

## 2018-02-13 ENCOUNTER — Other Ambulatory Visit: Payer: Self-pay | Admitting: Obstetrics & Gynecology

## 2018-02-25 ENCOUNTER — Encounter (HOSPITAL_COMMUNITY): Payer: Self-pay

## 2018-03-02 ENCOUNTER — Inpatient Hospital Stay (HOSPITAL_COMMUNITY)
Admission: AD | Admit: 2018-03-02 | Discharge: 2018-03-06 | DRG: 786 | Disposition: A | Discharge status: Home / Self Care | Payer: BLUE CROSS/BLUE SHIELD | Source: Ambulatory Visit | Attending: Obstetrics & Gynecology | Admitting: Obstetrics & Gynecology

## 2018-03-02 ENCOUNTER — Other Ambulatory Visit: Payer: Self-pay

## 2018-03-02 ENCOUNTER — Encounter (HOSPITAL_COMMUNITY): Payer: Self-pay

## 2018-03-02 DIAGNOSIS — O99824 Streptococcus B carrier state complicating childbirth: Secondary | ICD-10-CM | POA: Diagnosis present

## 2018-03-02 DIAGNOSIS — O24919 Unspecified diabetes mellitus in pregnancy, unspecified trimester: Secondary | ICD-10-CM | POA: Diagnosis present

## 2018-03-02 DIAGNOSIS — O4703 False labor before 37 completed weeks of gestation, third trimester: Secondary | ICD-10-CM | POA: Diagnosis present

## 2018-03-02 DIAGNOSIS — O3663X0 Maternal care for excessive fetal growth, third trimester, not applicable or unspecified: Secondary | ICD-10-CM | POA: Diagnosis present

## 2018-03-02 DIAGNOSIS — O3433 Maternal care for cervical incompetence, third trimester: Secondary | ICD-10-CM | POA: Diagnosis present

## 2018-03-02 DIAGNOSIS — O3430 Maternal care for cervical incompetence, unspecified trimester: Secondary | ICD-10-CM | POA: Diagnosis present

## 2018-03-02 DIAGNOSIS — Z98891 History of uterine scar from previous surgery: Secondary | ICD-10-CM

## 2018-03-02 DIAGNOSIS — O2412 Pre-existing diabetes mellitus, type 2, in childbirth: Secondary | ICD-10-CM | POA: Diagnosis present

## 2018-03-02 DIAGNOSIS — Z7984 Long term (current) use of oral hypoglycemic drugs: Secondary | ICD-10-CM

## 2018-03-02 DIAGNOSIS — D62 Acute posthemorrhagic anemia: Secondary | ICD-10-CM | POA: Diagnosis not present

## 2018-03-02 DIAGNOSIS — O34219 Maternal care for unspecified type scar from previous cesarean delivery: Secondary | ICD-10-CM

## 2018-03-02 DIAGNOSIS — O34211 Maternal care for low transverse scar from previous cesarean delivery: Principal | ICD-10-CM | POA: Diagnosis present

## 2018-03-02 DIAGNOSIS — E119 Type 2 diabetes mellitus without complications: Secondary | ICD-10-CM | POA: Diagnosis present

## 2018-03-02 DIAGNOSIS — Z3A36 36 weeks gestation of pregnancy: Secondary | ICD-10-CM

## 2018-03-02 DIAGNOSIS — O9081 Anemia of the puerperium: Secondary | ICD-10-CM | POA: Diagnosis not present

## 2018-03-02 DIAGNOSIS — O24113 Pre-existing diabetes mellitus, type 2, in pregnancy, third trimester: Secondary | ICD-10-CM

## 2018-03-02 LAB — URINALYSIS, ROUTINE W REFLEX MICROSCOPIC
BILIRUBIN URINE: NEGATIVE
Glucose, UA: 150 mg/dL — AB
HGB URINE DIPSTICK: NEGATIVE
Ketones, ur: NEGATIVE mg/dL
LEUKOCYTES UA: NEGATIVE
NITRITE: NEGATIVE
Protein, ur: NEGATIVE mg/dL
Specific Gravity, Urine: 1.005 (ref 1.005–1.030)
pH: 7 (ref 5.0–8.0)

## 2018-03-02 LAB — GLUCOSE, CAPILLARY: GLUCOSE-CAPILLARY: 172 mg/dL — AB (ref 65–99)

## 2018-03-02 LAB — CBC
HCT: 35.9 % — ABNORMAL LOW (ref 36.0–46.0)
Hemoglobin: 12.1 g/dL (ref 12.0–15.0)
MCH: 26.2 pg (ref 26.0–34.0)
MCHC: 33.7 g/dL (ref 30.0–36.0)
MCV: 77.7 fL — ABNORMAL LOW (ref 78.0–100.0)
Platelets: 191 10*3/uL (ref 150–400)
RBC: 4.62 MIL/uL (ref 3.87–5.11)
RDW: 14.9 % (ref 11.5–15.5)
WBC: 7.1 10*3/uL (ref 4.0–10.5)

## 2018-03-02 MED ORDER — LACTATED RINGERS IV SOLN
INTRAVENOUS | Status: DC
  Administered 2018-03-02: 23:00:00 via INTRAVENOUS

## 2018-03-02 MED ORDER — INSULIN DETEMIR 100 UNIT/ML ~~LOC~~ SOLN
7.0000 [IU] | Freq: Once | SUBCUTANEOUS | Status: AC
  Administered 2018-03-03: 7 [IU] via SUBCUTANEOUS
  Filled 2018-03-02: qty 0.07

## 2018-03-02 MED ORDER — TERBUTALINE SULFATE 1 MG/ML IJ SOLN
0.2500 mg | Freq: Once | INTRAMUSCULAR | Status: AC
  Administered 2018-03-02: 0.25 mg via SUBCUTANEOUS
  Filled 2018-03-02: qty 1

## 2018-03-02 MED ORDER — BETAMETHASONE SOD PHOS & ACET 6 (3-3) MG/ML IJ SUSP
12.0000 mg | Freq: Once | INTRAMUSCULAR | Status: AC
  Administered 2018-03-02: 12 mg via INTRAMUSCULAR
  Filled 2018-03-02: qty 2

## 2018-03-02 MED ORDER — INSULIN REGULAR HUMAN 100 UNIT/ML IJ SOLN
2.0000 [IU] | Freq: Once | INTRAMUSCULAR | Status: AC
  Administered 2018-03-03: 2 [IU] via SUBCUTANEOUS

## 2018-03-02 NOTE — MAU Note (Signed)
Pt reports contractions every few minute since 8pm. Pt denies LOF or vaginal bleeding. Reports some mucousy discharge. Pt reports good fetal movement. Pt has an abdominal cerclage and is scheduled for repeat c/s on 03/12/2018

## 2018-03-02 NOTE — MAU Provider Note (Signed)
Spoke with RN 36.2 wks UCs since 2 hours, getting worse per pt's phone call to me.  NPO, CBC, CBG, T&S, UA, IV fluids. Betamethasone x 1 dose for FLM, Terbutaline to assess if helps UCs.  If not improved - considering she has abdominal cerclage, well have to proceed with delivery tonight. Prior C/s x1. Maternal DM affecting pregnancy, on Metformin/ Insulin.  LGA baby.

## 2018-03-02 NOTE — MAU Note (Signed)
Pt. Reports contractions since 7:30pm.  Pt. Reports mucous discharge earlier this morning.  Pt. Denies fluid leaking.  Pt. Reports pain during contractions as 10/10. Pt. Denies bleeding

## 2018-03-03 ENCOUNTER — Other Ambulatory Visit: Payer: Self-pay

## 2018-03-03 ENCOUNTER — Encounter (HOSPITAL_COMMUNITY)
Admission: AD | Disposition: A | Discharge status: Home / Self Care | Payer: Self-pay | Source: Ambulatory Visit | Attending: Obstetrics & Gynecology

## 2018-03-03 ENCOUNTER — Observation Stay (HOSPITAL_COMMUNITY): Payer: BLUE CROSS/BLUE SHIELD | Admitting: Certified Registered Nurse Anesthetist

## 2018-03-03 ENCOUNTER — Encounter (HOSPITAL_COMMUNITY): Payer: Self-pay

## 2018-03-03 DIAGNOSIS — Z3A36 36 weeks gestation of pregnancy: Secondary | ICD-10-CM | POA: Diagnosis not present

## 2018-03-03 DIAGNOSIS — O4703 False labor before 37 completed weeks of gestation, third trimester: Secondary | ICD-10-CM | POA: Diagnosis present

## 2018-03-03 DIAGNOSIS — O3433 Maternal care for cervical incompetence, third trimester: Secondary | ICD-10-CM | POA: Diagnosis present

## 2018-03-03 DIAGNOSIS — O99824 Streptococcus B carrier state complicating childbirth: Secondary | ICD-10-CM | POA: Diagnosis present

## 2018-03-03 DIAGNOSIS — Z98891 History of uterine scar from previous surgery: Secondary | ICD-10-CM

## 2018-03-03 DIAGNOSIS — O2412 Pre-existing diabetes mellitus, type 2, in childbirth: Secondary | ICD-10-CM | POA: Diagnosis present

## 2018-03-03 DIAGNOSIS — O34219 Maternal care for unspecified type scar from previous cesarean delivery: Secondary | ICD-10-CM

## 2018-03-03 DIAGNOSIS — O34211 Maternal care for low transverse scar from previous cesarean delivery: Secondary | ICD-10-CM | POA: Diagnosis present

## 2018-03-03 DIAGNOSIS — D62 Acute posthemorrhagic anemia: Secondary | ICD-10-CM | POA: Diagnosis not present

## 2018-03-03 DIAGNOSIS — O3663X0 Maternal care for excessive fetal growth, third trimester, not applicable or unspecified: Secondary | ICD-10-CM | POA: Diagnosis present

## 2018-03-03 DIAGNOSIS — Z7984 Long term (current) use of oral hypoglycemic drugs: Secondary | ICD-10-CM | POA: Diagnosis not present

## 2018-03-03 DIAGNOSIS — O9081 Anemia of the puerperium: Secondary | ICD-10-CM | POA: Diagnosis not present

## 2018-03-03 DIAGNOSIS — E119 Type 2 diabetes mellitus without complications: Secondary | ICD-10-CM | POA: Diagnosis present

## 2018-03-03 LAB — TYPE AND SCREEN
ABO/RH(D): B POS
Antibody Screen: NEGATIVE

## 2018-03-03 LAB — GLUCOSE, CAPILLARY
Glucose-Capillary: 144 mg/dL — ABNORMAL HIGH (ref 65–99)
Glucose-Capillary: 211 mg/dL — ABNORMAL HIGH (ref 65–99)
Glucose-Capillary: 221 mg/dL — ABNORMAL HIGH (ref 65–99)
Glucose-Capillary: 153 mg/dL — ABNORMAL HIGH (ref 65–99)

## 2018-03-03 LAB — RPR: RPR: NONREACTIVE

## 2018-03-03 SURGERY — Surgical Case
Anesthesia: Spinal

## 2018-03-03 MED ORDER — DEXAMETHASONE SODIUM PHOSPHATE 4 MG/ML IJ SOLN
INTRAMUSCULAR | Status: AC
  Filled 2018-03-03: qty 1

## 2018-03-03 MED ORDER — PRENATAL MULTIVITAMIN CH
1.0000 | ORAL_TABLET | Freq: Every day | ORAL | Status: DC
  Administered 2018-03-03 – 2018-03-06 (×4): 1 via ORAL
  Filled 2018-03-03 (×4): qty 1

## 2018-03-03 MED ORDER — BUPIVACAINE IN DEXTROSE 0.75-8.25 % IT SOLN
INTRATHECAL | Status: DC | PRN
  Administered 2018-03-03: 1.5 mL via INTRATHECAL

## 2018-03-03 MED ORDER — DIPHENHYDRAMINE HCL 25 MG PO CAPS
25.0000 mg | ORAL_CAPSULE | ORAL | Status: DC | PRN
  Filled 2018-03-03: qty 1

## 2018-03-03 MED ORDER — ACETAMINOPHEN 325 MG PO TABS: 650.0000 mg | ORAL_TABLET | ORAL | Status: DC | PRN

## 2018-03-03 MED ORDER — OXYTOCIN 40 UNITS IN LACTATED RINGERS INFUSION - SIMPLE MED: 2.5000 [IU]/h | INTRAVENOUS | Status: DC

## 2018-03-03 MED ORDER — MENTHOL 3 MG MT LOZG
1.0000 | LOZENGE | OROMUCOSAL | Status: DC | PRN
  Filled 2018-03-03: qty 9

## 2018-03-03 MED ORDER — SIMETHICONE 80 MG PO CHEW: 80.0000 mg | CHEWABLE_TABLET | ORAL | Status: DC | PRN

## 2018-03-03 MED ORDER — IBUPROFEN 600 MG PO TABS
600.0000 mg | ORAL_TABLET | Freq: Four times a day (QID) | ORAL | Status: DC
  Administered 2018-03-03 – 2018-03-06 (×12): 600 mg via ORAL
  Filled 2018-03-03 (×14): qty 1

## 2018-03-03 MED ORDER — METHYLERGONOVINE MALEATE 0.2 MG/ML IJ SOLN
0.2000 mg | Freq: Four times a day (QID) | INTRAMUSCULAR | Status: DC | PRN
  Administered 2018-03-03: 0.2 mg via INTRAMUSCULAR

## 2018-03-03 MED ORDER — METFORMIN HCL 500 MG PO TABS
1000.0000 mg | ORAL_TABLET | Freq: Two times a day (BID) | ORAL | Status: DC
  Administered 2018-03-03 – 2018-03-04 (×4): 1000 mg via ORAL
  Filled 2018-03-03 (×5): qty 2

## 2018-03-03 MED ORDER — NALBUPHINE HCL 10 MG/ML IJ SOLN: 5.0000 mg | Freq: Once | INTRAMUSCULAR | Status: DC | PRN

## 2018-03-03 MED ORDER — PROMETHAZINE HCL 25 MG/ML IJ SOLN: 6.2500 mg | INTRAMUSCULAR | Status: DC | PRN

## 2018-03-03 MED ORDER — SODIUM BICARBONATE 8.4 % IV SOLN
INTRAVENOUS | Status: DC | PRN
  Administered 2018-03-03: 40 mL via EPIDURAL

## 2018-03-03 MED ORDER — ONDANSETRON HCL 4 MG/2ML IJ SOLN
INTRAMUSCULAR | Status: AC
  Filled 2018-03-03: qty 2

## 2018-03-03 MED ORDER — SIMETHICONE 80 MG PO CHEW
80.0000 mg | CHEWABLE_TABLET | Freq: Three times a day (TID) | ORAL | Status: DC
  Administered 2018-03-03 – 2018-03-06 (×10): 80 mg via ORAL
  Filled 2018-03-03 (×9): qty 1

## 2018-03-03 MED ORDER — MENTHOL 3 MG MT LOZG
1.0000 | LOZENGE | OROMUCOSAL | Status: DC | PRN
  Administered 2018-03-03: 3 mg via ORAL
  Filled 2018-03-03: qty 9

## 2018-03-03 MED ORDER — MORPHINE SULFATE (PF) 0.5 MG/ML IJ SOLN
INTRAMUSCULAR | Status: AC
  Filled 2018-03-03: qty 10

## 2018-03-03 MED ORDER — SOD CITRATE-CITRIC ACID 500-334 MG/5ML PO SOLN
30.0000 mL | Freq: Once | ORAL | Status: AC
  Administered 2018-03-03: 30 mL via ORAL
  Filled 2018-03-03: qty 15

## 2018-03-03 MED ORDER — NIFEDIPINE 10 MG PO CAPS
20.0000 mg | ORAL_CAPSULE | Freq: Once | ORAL | Status: AC
  Administered 2018-03-03: 20 mg via ORAL
  Filled 2018-03-03: qty 2

## 2018-03-03 MED ORDER — DEXTROMETHORPHAN POLISTIREX ER 30 MG/5ML PO SUER
30.0000 mg | Freq: Two times a day (BID) | ORAL | Status: DC | PRN
  Administered 2018-03-03 – 2018-03-04 (×3): 30 mg via ORAL
  Filled 2018-03-03 (×4): qty 5

## 2018-03-03 MED ORDER — MORPHINE SULFATE (PF) 0.5 MG/ML IJ SOLN
INTRAMUSCULAR | Status: DC | PRN
  Administered 2018-03-03: .2 mg via EPIDURAL

## 2018-03-03 MED ORDER — KETOROLAC TROMETHAMINE 30 MG/ML IJ SOLN: 30.0000 mg | Freq: Once | INTRAMUSCULAR | Status: DC | PRN

## 2018-03-03 MED ORDER — PHENYLEPHRINE 8 MG IN D5W 100 ML (0.08MG/ML) PREMIX OPTIME
INJECTION | INTRAVENOUS | Status: DC | PRN
  Administered 2018-03-03: 60 ug/min via INTRAVENOUS

## 2018-03-03 MED ORDER — INSULIN ASPART 100 UNIT/ML ~~LOC~~ SOLN
2.0000 [IU] | Freq: Three times a day (TID) | SUBCUTANEOUS | Status: DC | PRN
  Administered 2018-03-03: 4 [IU] via SUBCUTANEOUS
  Administered 2018-03-03: 6 [IU] via SUBCUTANEOUS
  Administered 2018-03-04: 2 [IU] via SUBCUTANEOUS
  Filled 2018-03-03: qty 1
  Filled 2018-03-03 (×3): qty 0.02
  Filled 2018-03-03 (×3): qty 1

## 2018-03-03 MED ORDER — OXYCODONE-ACETAMINOPHEN 5-325 MG PO TABS: 1.0000 | ORAL_TABLET | ORAL | Status: DC | PRN

## 2018-03-03 MED ORDER — PHENYLEPHRINE 40 MCG/ML (10ML) SYRINGE FOR IV PUSH (FOR BLOOD PRESSURE SUPPORT)
PREFILLED_SYRINGE | INTRAVENOUS | Status: AC
  Filled 2018-03-03: qty 10

## 2018-03-03 MED ORDER — METFORMIN HCL 500 MG PO TABS: 1000.0000 mg | ORAL_TABLET | Freq: Two times a day (BID) | ORAL | Status: DC

## 2018-03-03 MED ORDER — PHENYLEPHRINE 8 MG IN D5W 100 ML (0.08MG/ML) PREMIX OPTIME
INJECTION | INTRAVENOUS | Status: AC
  Filled 2018-03-03: qty 100

## 2018-03-03 MED ORDER — DIPHENHYDRAMINE HCL 50 MG/ML IJ SOLN: 12.5000 mg | INTRAMUSCULAR | Status: DC | PRN

## 2018-03-03 MED ORDER — CEFAZOLIN SODIUM-DEXTROSE 2-4 GM/100ML-% IV SOLN
2.0000 g | INTRAVENOUS | Status: DC
  Filled 2018-03-03: qty 100

## 2018-03-03 MED ORDER — NALBUPHINE HCL 10 MG/ML IJ SOLN: 5.0000 mg | INTRAMUSCULAR | Status: DC | PRN

## 2018-03-03 MED ORDER — SENNOSIDES-DOCUSATE SODIUM 8.6-50 MG PO TABS
2.0000 | ORAL_TABLET | ORAL | Status: DC
  Administered 2018-03-04 – 2018-03-06 (×3): 2 via ORAL
  Filled 2018-03-03 (×3): qty 2

## 2018-03-03 MED ORDER — METHYLERGONOVINE MALEATE 0.2 MG PO TABS
0.2000 mg | ORAL_TABLET | Freq: Four times a day (QID) | ORAL | Status: DC | PRN
  Administered 2018-03-03 – 2018-03-04 (×3): 0.2 mg via ORAL
  Filled 2018-03-03 (×4): qty 1

## 2018-03-03 MED ORDER — DIBUCAINE 1 % RE OINT: 1.0000 "application " | TOPICAL_OINTMENT | RECTAL | Status: DC | PRN

## 2018-03-03 MED ORDER — ONDANSETRON HCL 4 MG/2ML IJ SOLN: 4.0000 mg | Freq: Three times a day (TID) | INTRAMUSCULAR | Status: DC | PRN

## 2018-03-03 MED ORDER — OXYCODONE-ACETAMINOPHEN 5-325 MG PO TABS: 2.0000 | ORAL_TABLET | ORAL | Status: DC | PRN

## 2018-03-03 MED ORDER — LACTATED RINGERS IV SOLN
INTRAVENOUS | Status: DC
  Administered 2018-03-03 (×2): via INTRAVENOUS

## 2018-03-03 MED ORDER — COCONUT OIL OIL
1.0000 | TOPICAL_OIL | Status: DC | PRN
Start: 2018-03-03 — End: 2018-03-06

## 2018-03-03 MED ORDER — NALOXONE HCL 4 MG/10ML IJ SOLN
1.0000 ug/kg/h | INTRAVENOUS | Status: DC | PRN
  Filled 2018-03-03: qty 5

## 2018-03-03 MED ORDER — TETANUS-DIPHTH-ACELL PERTUSSIS 5-2.5-18.5 LF-MCG/0.5 IM SUSP: 0.5000 mL | Freq: Once | INTRAMUSCULAR | Status: DC

## 2018-03-03 MED ORDER — NIFEDIPINE 10 MG PO CAPS: 10.0000 mg | ORAL_CAPSULE | Freq: Four times a day (QID) | ORAL | Status: DC

## 2018-03-03 MED ORDER — SCOPOLAMINE 1 MG/3DAYS TD PT72: 1.0000 | MEDICATED_PATCH | Freq: Once | TRANSDERMAL | Status: DC

## 2018-03-03 MED ORDER — WITCH HAZEL-GLYCERIN EX PADS: 1.0000 "application " | MEDICATED_PAD | CUTANEOUS | Status: DC | PRN

## 2018-03-03 MED ORDER — METHYLERGONOVINE MALEATE 0.2 MG/ML IJ SOLN
INTRAMUSCULAR | Status: AC
  Filled 2018-03-03: qty 1

## 2018-03-03 MED ORDER — MEPERIDINE HCL 25 MG/ML IJ SOLN: 6.2500 mg | INTRAMUSCULAR | Status: DC | PRN

## 2018-03-03 MED ORDER — DIPHENHYDRAMINE HCL 25 MG PO CAPS: 25.0000 mg | ORAL_CAPSULE | Freq: Four times a day (QID) | ORAL | Status: DC | PRN

## 2018-03-03 MED ORDER — SIMETHICONE 80 MG PO CHEW
80.0000 mg | CHEWABLE_TABLET | ORAL | Status: DC
Start: 2018-03-04 — End: 2018-03-06
  Administered 2018-03-04 (×2): 80 mg via ORAL
  Filled 2018-03-03 (×2): qty 1

## 2018-03-03 MED ORDER — ONDANSETRON HCL 4 MG/2ML IJ SOLN
INTRAMUSCULAR | Status: DC | PRN
  Administered 2018-03-03: 4 mg via INTRAVENOUS

## 2018-03-03 MED ORDER — HYDROMORPHONE HCL 1 MG/ML IJ SOLN: 0.2500 mg | INTRAMUSCULAR | Status: DC | PRN

## 2018-03-03 MED ORDER — DEXAMETHASONE SODIUM PHOSPHATE 4 MG/ML IJ SOLN
INTRAMUSCULAR | Status: DC | PRN
  Administered 2018-03-03: 4 mg via INTRAVENOUS

## 2018-03-03 MED ORDER — SODIUM CHLORIDE 0.9% FLUSH: 3.0000 mL | INTRAVENOUS | Status: DC | PRN

## 2018-03-03 MED ORDER — LACTATED RINGERS IV SOLN
INTRAVENOUS | Status: DC
  Administered 2018-03-03 (×2): via INTRAVENOUS

## 2018-03-03 MED ORDER — OXYTOCIN 10 UNIT/ML IJ SOLN
INTRAMUSCULAR | Status: AC
  Filled 2018-03-03: qty 4

## 2018-03-03 MED ORDER — CEFAZOLIN SODIUM-DEXTROSE 2-4 GM/100ML-% IV SOLN
2.0000 g | INTRAVENOUS | Status: AC
  Administered 2018-03-03: 2 g via INTRAVENOUS

## 2018-03-03 MED ORDER — NALOXONE HCL 0.4 MG/ML IJ SOLN: 0.4000 mg | INTRAMUSCULAR | Status: DC | PRN

## 2018-03-03 MED ORDER — METHYLERGONOVINE MALEATE 0.2 MG/ML IJ SOLN: 0.2000 mg | INTRAMUSCULAR | Status: DC | PRN

## 2018-03-03 MED ORDER — SCOPOLAMINE 1 MG/3DAYS TD PT72
MEDICATED_PATCH | TRANSDERMAL | Status: AC
  Filled 2018-03-03: qty 1

## 2018-03-03 MED ORDER — METHYLERGONOVINE MALEATE 0.2 MG PO TABS: 0.2000 mg | ORAL_TABLET | ORAL | Status: DC | PRN

## 2018-03-03 SURGICAL SUPPLY — 44 items
BARRIER ADHS 3X4 INTERCEED (GAUZE/BANDAGES/DRESSINGS) ×3 IMPLANT
BENZOIN TINCTURE PRP APPL 2/3 (GAUZE/BANDAGES/DRESSINGS) ×3 IMPLANT
CHLORAPREP W/TINT 26ML (MISCELLANEOUS) ×3 IMPLANT
CLAMP CORD UMBIL (MISCELLANEOUS) ×3 IMPLANT
CLOSURE STERI STRIP 1/2 X4 (GAUZE/BANDAGES/DRESSINGS) ×3 IMPLANT
CLOSURE WOUND 1/2 X4 (GAUZE/BANDAGES/DRESSINGS)
CLOTH BEACON ORANGE TIMEOUT ST (SAFETY) ×3 IMPLANT
DRAPE C SECTION CLR SCREEN (DRAPES) ×3 IMPLANT
DRSG OPSITE POSTOP 4X10 (GAUZE/BANDAGES/DRESSINGS) ×3 IMPLANT
ELECT REM PT RETURN 9FT ADLT (ELECTROSURGICAL) ×3
ELECTRODE REM PT RTRN 9FT ADLT (ELECTROSURGICAL) ×1 IMPLANT
EXTRACTOR VACUUM KIWI (MISCELLANEOUS) IMPLANT
EXTRACTOR VACUUM M CUP 4 TUBE (SUCTIONS) ×2 IMPLANT
EXTRACTOR VACUUM M CUP 4' TUBE (SUCTIONS) ×1
GAUZE SPONGE 4X4 12PLY STRL LF (GAUZE/BANDAGES/DRESSINGS) ×6 IMPLANT
GLOVE BIO SURGEON STRL SZ7 (GLOVE) ×3 IMPLANT
GLOVE BIOGEL PI IND STRL 7.0 (GLOVE) ×7 IMPLANT
GLOVE BIOGEL PI INDICATOR 7.0 (GLOVE) ×14
GOWN STRL REUS W/TWL LRG LVL3 (GOWN DISPOSABLE) ×6 IMPLANT
KIT ABG SYR 3ML LUER SLIP (SYRINGE) IMPLANT
NEEDLE HYPO 25X5/8 SAFETYGLIDE (NEEDLE) IMPLANT
NS IRRIG 1000ML POUR BTL (IV SOLUTION) ×3 IMPLANT
PACK C SECTION WH (CUSTOM PROCEDURE TRAY) ×3 IMPLANT
PAD ABD 7.5X8 STRL (GAUZE/BANDAGES/DRESSINGS) ×3 IMPLANT
PAD ABD DERMACEA PRESS 5X9 (GAUZE/BANDAGES/DRESSINGS) ×3 IMPLANT
PAD OB MATERNITY 4.3X12.25 (PERSONAL CARE ITEMS) ×3 IMPLANT
PENCIL SMOKE EVAC W/HOLSTER (ELECTROSURGICAL) ×3 IMPLANT
RETRACTOR WND ALEXIS 25 LRG (MISCELLANEOUS) ×1 IMPLANT
RTRCTR C-SECT PINK 25CM LRG (MISCELLANEOUS) IMPLANT
RTRCTR WOUND ALEXIS 25CM LRG (MISCELLANEOUS) ×3
STRIP CLOSURE SKIN 1/2X4 (GAUZE/BANDAGES/DRESSINGS) IMPLANT
SUT MON AB-0 CT1 36 (SUTURE) ×6 IMPLANT
SUT PLAIN 0 NONE (SUTURE) IMPLANT
SUT PLAIN 2 0 (SUTURE) ×2
SUT PLAIN ABS 2-0 CT1 27XMFL (SUTURE) ×1 IMPLANT
SUT VIC AB 0 CT1 27 (SUTURE) ×4
SUT VIC AB 0 CT1 27XBRD ANBCTR (SUTURE) ×2 IMPLANT
SUT VIC AB 2-0 CT1 27 (SUTURE) ×2
SUT VIC AB 2-0 CT1 TAPERPNT 27 (SUTURE) ×1 IMPLANT
SUT VIC AB 4-0 KS 27 (SUTURE) ×3 IMPLANT
SUT VICRYL 0 TIES 12 18 (SUTURE) IMPLANT
TOWEL OR 17X24 6PK STRL BLUE (TOWEL DISPOSABLE) ×3 IMPLANT
TRAY FOLEY BAG SILVER LF 14FR (SET/KITS/TRAYS/PACK) ×3 IMPLANT
WATER STERILE IRR 1000ML POUR (IV SOLUTION) ×3 IMPLANT

## 2018-03-03 NOTE — Lactation Note (Addendum)
This note was copied from a baby's chart. Lactation Consultation Note  Patient Name: Allison Reed Date: 03/03/2018 Reason for consult: Late-preterm 34-36.6wks;Initial assessment   P2, Baby 9 hours old.  [redacted]w[redacted]d 9 lb1.5 oz.  Parents choose BR/FO Breastfed first child for 4 weeks with FO (jaundiced) and pumped for 9 mos.  Mother has hx of DM2, IVF.  Insulin & Metformin. Infant's BS stabilized per last serum.  FOB giving baby bottle of formula upon entering.  Reviewed volume guidelines and paced feeding.  Baby received 16 ml of formula. Reviewed LPI information sheet.   FOB (MD) stated that mother has pumped but only received drops. Provided education regarding how milk comes to volume and the importance of stimulation.  Encouraged her to breastfeed first then post pump and give baby back volume pumped at next feeding with the difference w/ formula. Mom encouraged to feed baby 8-12 times/24 hours and with feeding cues at least q 3 hours.  Mother sleepy during consult but she did state she knows how to hand express. Mom made aware of O/P services, breastfeeding support groups, community resources, and our phone # for post-discharge questions.   Suggest mother call next time baby latches to view latch.         Maternal Data Has patient been taught Hand Expression?: Yes Does the patient have breastfeeding experience prior to this delivery?: Yes  Feeding Feeding Type: Formula Nipple Type: Slow - flow  LATCH Score                   Interventions Interventions: Breast compression  Lactation Tools Discussed/Used Pump Review: Setup, frequency, and cleaning;Milk Storage Initiated by:: RN Date initiated:: 03/03/18   Consult Status Consult Status: Follow-up Date: 03/04/18 Follow-up type: In-patient    Vivianne Master [facility] 03/03/2018, 2:06 PM

## 2018-03-03 NOTE — Progress Notes (Signed)
Pt prepped and ready for OR. En route to OR 9.

## 2018-03-03 NOTE — Progress Notes (Signed)
Pt to be admitted to [facility] for observation

## 2018-03-03 NOTE — Op Note (Signed)
Cesarean Section Procedure Note   Allison Reed  03/03/2018  Indications: Preterm labor, 36.2 weeks, tried terbutaline and Procardia 20mg  to stop labor. Gave Betamethasone single dose. As the contractiosn returned and stronger pain returned, decision was made for proceeding with C-section.    Pre-operative Diagnosis: Previous Cesarean Section, Abdominal Cerclage.                                               DM II  Post-operative Diagnosis: Same   Surgeon: Azucena Fallen, MD    Assistants: Artelia Laroche, CNM   Anesthesia: spinal   Procedure Details:  The patient was seen in the Antepartum Room. The risks, benefits, complications, treatment options, and expected outcomes were discussed with the patient. The patient concurred with the proposed plan, giving informed consent. identified as Dominic Pea and the procedure verified as Repeat C-Section Delivery and possible abdominal cerclage removal. A Time Out was held and the above information confirmed.  After induction of anesthesia, the patient was draped and prepped in the usual sterile manner. A Pfannenstiel incision was made and carried down through the subcutaneous tissue to the fascia. Fascial incision was made and extended transversely. Rectus muscles were fused in the midline and also to under the fascia entire and densely. The fascia was separated from the underlying rectus tissue superiorly and inferiorly after several attempts. Midline of fused rectus bellies was separated carefully. The peritoneum was identified and entered. Peritoneal incision was extended longitudinally. Anterior bladder peritoneum was adhesed to anterior uterine mid body. Adhesions taken down with sharp dissection with scissors. The utero-vesical peritoneal reflection was incised transversely and the bladder flap was bluntly freed from the lower uterine segment. Baby was cephalic but floating. Baby was pushed down from the fundus and in longitudinal lie. Then a low  transverse uterine incision was made. Copious clear amniotic fluid was drained. Head was brought to the incision with keeping fundal pressure and preventing him to turn away but head was not well flexed. So Mushroom vacuum was applied. First pop-offs with low traction were due to lot of hair, at third application, incision of the abdomen felt too tight even though baby was out of the uterine incision and so left rectus belly was cut 2 cm to allow more space as there was no stretch in the abdominal wall due to muscle adhesions. Baby BOY was delivered cephalic at 1.49 am. Delayed cord clamping done as baby was vigorous with good cry. Then cord clamped cut and baby handed to NICU team in attendance. Apgar scores of 8 at one minute and 9 at five minutes. Cord ph was sent, cord blood was obtained for evaluation. The placenta was removed Intact and appeared normal. The uterine outline, ovaries appeared normal. No extension of uterine incision noted. The uterine incision was closed with running locked sutures of 0 Monocryl followed by a second imbricating layer. Hemostasis was observed. Peritoneal gutters cleaned.  Cerclage was palpated under bladder peritoneum. Dissection was attempted to further dissect down bladder flap but due to dense adhesions it was causing more bleeding. Hence cerclage removal was deferred. Hemostasis achieved and Interceed placed over the uterine incision under the bladder flap.  Peritoneal closure done with 2-0 Vicryl. Left rectus was partially cut, edge of the upper cut portion was stitched with opposite belly to allow it to approximate with the lower cut edge better.  Hemostasis noted.  The fascia was then reapproximated with running sutures of 0Vicryl. The subcuticular closure was performed using 2-0plain gut. The skin was closed with 4-0 vicryl. Sterile dressing and pressure dressing placed.  Instrument, sponge, and needle counts were correct prior the abdominal closure and were correct at  the conclusion of the case.   Findings: Anterior uterine and bladder adhesions. Unengaged floating head with copious clear amniotic fluid. Head delivery with Mushroom vacuum due to unflexed head and tight abdominal wall muscles. Pop offs due to fetal hair but overall uncomplicated delivery. Extensive vascularity over bladder reflection, could feel cerclage and see it well on right side but anterior and to the left dissection caused bleeding, so stopped attempting cerclage removal and closed up after controlling hemostasis.    Estimated Blood Loss: 879 mL   Total IV Fluids: 1600 ml LR   Urine Output: 500 CC OF clear urine  Specimens: Cord gas, cord blood   Complications: no complications  Disposition: PACU - hemodynamically stable.   Maternal Condition: stable   Baby condition / location:  Couplet care / Skin to Skin  Attending Attestation: I performed the procedure.   Signed: Surgeon(s): Azucena Fallen, MD

## 2018-03-03 NOTE — Progress Notes (Signed)
Due to frequent ctx and change in pt status, prepare pt for c/s per Dr. Benjie Karvonen.

## 2018-03-03 NOTE — Progress Notes (Signed)
0225 - to pt's room to adjust Toco. Palpated pt's ABD. Actively and strongly contracting. Stayed with pt. Through contractions. Adjusted pt. Position and no relief. HR elevated to 130s.  Pt. Complaint of "groin pain"  With palpation strong contractions noted to be every 2-5 mins and 1- 1.5 mins in length.  Call to Dr. Benjie Karvonen after this assessment.  Dr. Benjie Karvonen will come for C/S.  Calls to OR, anest. And NICU charge.  Preparing patient.

## 2018-03-03 NOTE — Anesthesia Preprocedure Evaluation (Signed)
Anesthesia Evaluation  Patient identified by MRN, date of birth, ID band Patient awake    Reviewed: Allergy & Precautions, NPO status , Patient's Chart, lab work & pertinent test results  History of Anesthesia Complications Negative for: history of anesthetic complications  Airway Mallampati: III  TM Distance: >3 FB Neck ROM: Full    Dental  (+) Teeth Intact, Dental Advisory Given   Pulmonary neg pulmonary ROS,    Pulmonary exam normal breath sounds clear to auscultation       Cardiovascular Exercise Tolerance: Good negative cardio ROS Normal cardiovascular exam Rhythm:Regular Rate:Normal     Neuro/Psych  Headaches, negative psych ROS   GI/Hepatic negative GI ROS, Neg liver ROS,   Endo/Other  diabetes, Well Controlled, Type 2, Insulin Dependent  Renal/GU negative Renal ROS     Musculoskeletal negative musculoskeletal ROS (+)   Abdominal   Peds  Hematology negative hematology ROS (+)   Anesthesia Other Findings Day of surgery medications reviewed with the patient.  Reproductive/Obstetrics (+) Pregnancy                             Anesthesia Physical  Anesthesia Plan  ASA: II  Anesthesia Plan: Spinal   Post-op Pain Management:    Induction:   PONV Risk Score and Plan: 4 or greater and Ondansetron, Dexamethasone and Scopolamine patch - Pre-op  Airway Management Planned:   Additional Equipment:   Intra-op Plan:   Post-operative Plan:   Informed Consent: I have reviewed the patients History and Physical, chart, labs and discussed the procedure including the risks, benefits and alternatives for the proposed anesthesia with the patient or authorized representative who has indicated his/her understanding and acceptance.   Dental advisory given  Plan Discussed with: CRNA  Anesthesia Plan Comments:         Anesthesia Quick Evaluation

## 2018-03-03 NOTE — Anesthesia Postprocedure Evaluation (Signed)
Anesthesia Post Note  Patient: Allison Reed  Procedure(s) Performed: Repeat CESAREAN SECTION/Removal of Abdominal Cerclage (N/A )     Patient location during evaluation: Mother Baby Anesthesia Type: Spinal Level of consciousness: awake Pain management: satisfactory to patient Vital Signs Assessment: post-procedure vital signs reviewed and stable Respiratory status: spontaneous breathing Cardiovascular status: stable Anesthetic complications: no    Last Vitals:  Vitals:   03/03/18 1505 03/03/18 1823  BP: 110/74 111/68  Pulse:  (!) 107  Resp:  19  Temp:    SpO2: 99% 99%    Last Pain:  Vitals:   03/03/18 1500  TempSrc:   PainSc: 0-No pain   Pain Goal:                 [employer]

## 2018-03-03 NOTE — Lactation Note (Signed)
This note was copied from a baby's chart. Lactation Consultation Note  Patient Name: Allison Reed [id number] Date: 03/03/2018 Reason for consult: Late-preterm 34-36.6wks;Initial assessment  Parents called for assist with feeding. Mom was assisted with getting infant to breast, but infant did not actively suckle. Infant was spoon-fed a small amount of EBM. Dad offered bottle of formula. As I was leaving room, infant had already drunk 54mL.   Mom prefers using her hand pump right now, as compared to the Symphony. Mom has a Medela pump at home. LPI Crib card was placed in bassinet.   [facility] 03/03/2018, 4:56 PM

## 2018-03-03 NOTE — H&P (Signed)
Allison Reed is a 42 y.o. female presenting for UCs at 36.2 weeks. Lost mucous plug the morning Good FMs. No bleeding. Pain is shooting down in the vagina.  IVF pregnancy. PGS normal.  Abdominal cerclage since 2016 and one successful 37.2 wks C/s then.  DM II, on Metformin since pre-preg and added Insulin since 25 wks, sees her endocrinologist for that [place] Antesting and growth sono at schedueld intervals - last sono 3/22 - 9'2" , 99%, AC 99%, AFI 23 cm at 99%  OB History    Gravida  6   Para  1   Term  1   Preterm      AB  4   Living  1     SAB  3   TAB      Ectopic  1   Multiple  0   Live Births  1          Past Medical History:  Diagnosis Date  . Diabetes mellitus without complication (HCC)    Type 2  . Gestational diabetes   . Headache    otc med prn  . Heartburn in pregnancy   . 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 Missed abortion    no surgery required  . Postpartum care following cesarean delivery (11/2) 10/12/2015  . UTI in pregnancy 03/2015   recent UTI, treatment completed   Past Surgical History:  Procedure Laterality Date  . APPENDECTOMY  2006  . CERCLAGE LAPAROSCOPIC ABDOMINAL N/A 04/15/2015   Procedure: LAPAROSCOPIC TRANSABDOMINAL CERVCOISTHMIC CERCLAGE;  Surgeon: Governor Specking, MD;  Location: [facility];  Service: Gynecology;  Laterality: N/A;  . CERVICAL CERCLAGE  02/2011   vaginal  . CESAREAN SECTION N/A 10/12/2015   Procedure: CESAREAN SECTION;  Surgeon: Princess Bruins, MD;  Location: [facility];  Service: Obstetrics;  Laterality: N/A;  . WISDOM TOOTH EXTRACTION  2001   Family History: family history includes Diabetes in her father. Social History:  reports that she has never smoked. She has never used smokeless tobacco. She reports that she drinks alcohol. She reports that she does not use drugs.     Maternal Diabetes: Yes:  Diabetes Type:  Pre-pregnancy, Insulin/Medication controlled Genetic Screening: Normal  PGS done with IVF   Maternal Ultrasounds/Referrals: Normal  Anatomy but LGA  Fetal Ultrasounds or other Referrals:  Fetal echo  Normal  Maternal Substance Abuse:  No Significant Maternal Medications:  Meds include: Other: Metformin 2500mg  daily and Insulin- Novolog and Levamir  Significant Maternal Lab Results:  Lab values include: Group B Strep positive Other Comments:  None  ROS  Neg  History   Blood pressure 100/62, pulse (!) 109, temperature (!) 97.4 F (36.3 C), temperature source Oral, resp. rate 16, height 5\' 7"  (1.702 m), weight 181 lb (82.1 kg), SpO2 99 %, unknown if currently breastfeeding. Exam Physical Exam  Physical exam:  A&O x 3, no acute distress. Pleasant HEENT neg, no thyromegaly Lungs CTA bilat CV RRR, S1S2 normal Abdo soft, non tender, non acute. Uterine contractions, palpably strong  Extr no edema/ tenderness Pelvic deferred  FHT  135 + accels no decels mod variab- cat I Toco regular q 3-4 min   Prenatal labs: ABO, Rh: --/--/B POS (03/24 2332) Antibody: NEG (03/24 2332) Rubella: Immune (09/27 0000) RPR: Nonreactive (09/27 0000)  HBsAg: Negative (09/27 0000)  HIV: Non-reactive (09/27 0000)  GBS:   positive   Assessment/Plan: 42 yo G6P1141, 36.2 wks, abdominal cerclage, IVF pregnancy, AMA, DM II with preterm contractions IVFluids, Terbutaline,  Betamethasone 1 dose in MAU helped slowed contractions but not resolved. Admit for observation, CEFM, liquids only. Will start Procardia 20 mg and then 10mg  q 6 hrs and hold off RC/s due to prematurity, unless worse again.  Plan reviewed, couple agrees.   Elveria Royals

## 2018-03-03 NOTE — Anesthesia Procedure Notes (Signed)
Spinal  Patient location during procedure: OR Staffing Anesthesiologist: Nolon Nations, MD Performed: anesthesiologist  Preanesthetic Checklist Completed: patient identified, site marked, surgical consent, pre-op evaluation, timeout performed, IV checked, risks and benefits discussed and monitors and equipment checked Spinal Block Patient position: sitting Prep: Betadine, site prepped and draped and DuraPrep Patient monitoring: heart rate, continuous pulse ox and blood pressure Approach: midline Location: L3-4 Injection technique: single-shot Needle Needle type: Sprotte  Needle gauge: 24 G Needle length: 9 cm Assessment Sensory level: T6 Additional Notes Expiration date of kit checked and confirmed. Patient tolerated procedure well, without complications.

## 2018-03-03 NOTE — Anesthesia Postprocedure Evaluation (Signed)
Anesthesia Post Note  Patient: Allison Reed  Procedure(s) Performed: Repeat CESAREAN SECTION/Removal of Abdominal Cerclage (N/A )     Patient location during evaluation: PACU Anesthesia Type: Spinal Level of consciousness: oriented and awake and alert Pain management: pain level controlled Vital Signs Assessment: post-procedure vital signs reviewed and stable Respiratory status: spontaneous breathing, respiratory function stable and nonlabored ventilation Cardiovascular status: blood pressure returned to baseline and stable Postop Assessment: no headache, no backache, no apparent nausea or vomiting, patient able to bend at knees and spinal receding Anesthetic complications: no    Last Vitals:  Vitals:   03/03/18 0645 03/03/18 0700  BP: 116/84 118/78  Pulse: 91 93  Resp: (!) 26 15  Temp: 36.6 C   SpO2: 98% 99%    Last Pain:  Vitals:   03/03/18 0645  TempSrc: Oral  PainSc:    Pain Goal:                 [name] A.

## 2018-03-03 NOTE — Transfer of Care (Signed)
Immediate Anesthesia Transfer of Care Note  Patient: Allison Reed  Procedure(s) Performed: Repeat CESAREAN SECTION/Removal of Abdominal Cerclage (N/A )  Patient Location: PACU  Anesthesia Type:Spinal  Level of Consciousness: awake, alert , oriented and patient cooperative  Airway & Oxygen Therapy: Patient Spontanous Breathing  Post-op Assessment: Report given to RN and Post -op Vital signs reviewed and stable  Post vital signs: Reviewed and stable  Last Vitals:  Vitals Value Taken Time  BP    Temp    Pulse    Resp    SpO2      Last Pain:  Vitals:   03/03/18 0214  TempSrc: Oral  PainSc:          Complications: No apparent anesthesia complications

## 2018-03-03 NOTE — Addendum Note (Signed)
Addendum  created 03/03/18 1840 by Asher Muir, CRNA   Sign clinical note

## 2018-03-03 NOTE — Progress Notes (Signed)
Patient ID: Allison Reed, female   DOB: [birth year], 42 y.o.   MRN: [id number] S/p Terbutaline, Procardia 20mg  and IVHydration. UCs have returned, again q 4 min with vaginal shooting with contractions. Denies bleeding or ROM.  Proceed with C/s now.  Risks/complications of surgery reviewed incl infection, bleeding, damage to internal organs including bladder, bowels, ureters, blood vessels, other risks from anesthesia, VTE and delayed complications of any surgery, complications in future surgery reviewed. Also discussed neonatal complications incl difficult delivery, laceration, vacuum assistance, TTN etc. Pt understands and agrees, all concerns addressed.

## 2018-03-04 ENCOUNTER — Encounter (HOSPITAL_COMMUNITY): Payer: Self-pay | Admitting: Obstetrics & Gynecology

## 2018-03-04 LAB — CBC
HCT: 31.8 % — ABNORMAL LOW (ref 36.0–46.0)
HEMOGLOBIN: 10.3 g/dL — AB (ref 12.0–15.0)
MCH: 25.5 pg — ABNORMAL LOW (ref 26.0–34.0)
MCHC: 32.4 g/dL (ref 30.0–36.0)
MCV: 78.7 fL (ref 78.0–100.0)
Platelets: 176 10*3/uL (ref 150–400)
RBC: 4.04 MIL/uL (ref 3.87–5.11)
RDW: 15.2 % (ref 11.5–15.5)
WBC: 8.5 10*3/uL (ref 4.0–10.5)

## 2018-03-04 LAB — GLUCOSE, CAPILLARY
GLUCOSE-CAPILLARY: 139 mg/dL — AB (ref 65–99)
GLUCOSE-CAPILLARY: 148 mg/dL — AB (ref 65–99)
Glucose-Capillary: 142 mg/dL — ABNORMAL HIGH (ref 65–99)
Glucose-Capillary: 167 mg/dL — ABNORMAL HIGH (ref 65–99)

## 2018-03-04 MED ORDER — POLYSACCHARIDE IRON COMPLEX 150 MG PO CAPS
150.0000 mg | ORAL_CAPSULE | Freq: Every day | ORAL | Status: DC
  Administered 2018-03-04 – 2018-03-06 (×3): 150 mg via ORAL
  Filled 2018-03-04 (×3): qty 1

## 2018-03-04 MED ORDER — MAGNESIUM OXIDE 400 (241.3 MG) MG PO TABS
400.0000 mg | ORAL_TABLET | Freq: Every day | ORAL | Status: DC
  Administered 2018-03-04 – 2018-03-06 (×3): 400 mg via ORAL
  Filled 2018-03-04 (×4): qty 1

## 2018-03-04 NOTE — Lactation Note (Signed)
This note was copied from a baby's chart. Lactation Consultation Note  Patient Name: Boy Synai Prettyman [id number] Date: 03/04/2018 Reason for consult: Follow-up assessment;Late-preterm 34-36.6wks   Baby 52 hours old.  Mother appears very tired.  States she breastfed baby at 75 for 5 min per side. Mother states she will pump with DEBP after lunch. Mother is supplementing w/ formula. Encouraged her to call if she needs assistance with latching.    Maternal Data    Feeding Feeding Type: Bottle Fed - Formula Length of feed: 10 min  LATCH Score                   Interventions    Lactation Tools Discussed/Used     Consult Status Consult Status: Follow-up Date: 03/05/18 Follow-up type: In-patient    Vivianne Master [facility] 03/04/2018, 1:01 PM

## 2018-03-04 NOTE — Progress Notes (Signed)
Patient reqquested cough syrup and rest delsym given and note put on door

## 2018-03-04 NOTE — Progress Notes (Addendum)
S:  Reports feeling tired with nurses coming in all night and feeding baby.  Some c/o of incisional pain managed with ibuprofen.  Tolerating regular diet without N/V.  Up ad lib/ambulating.  Voiding without difficulty.  Passing flatus.  No BM.  Reports bleeding as light, without clots. Working on Regulatory affairs officer.  Hand expressing colostrum and feeding baby and supplementing with formula.  Using hand pump.  Refused insulin this afternoon.  O: Vital signs BP 114/76 (BP Location: Left Arm)   Pulse 95   Temp 97.9 F (36.6 C) (Oral)   Resp 16   Ht 5\' 7"  (1.702 m)   Wt 82.1 kg (181 lb)   SpO2 96%   Breastfeeding? Unknown   BMI 28.35 kg/m    Labs  Recent Labs    03/02/18 2332 03/04/18 0526  WBC 7.1 8.5  HGB 12.1 10.3*  PLT 191 176   BS 139-221  Alert and O x 3. Abdomen: soft, non-tender, non-distended. Fundus: firm, non-tender, U- 1 Lochia light, without clots. Perineum:  No edema. Extremities:  Trace edema Incision:  Warm, dry, well-approximated.  Honeycomb dressing in place with small amount of drainage.  No s/s of infection noted.  A: PP Post-op Day 1, s/p repeat c/s Type 2 DM ABL Anemia Breastfeeding, supplementing with formula. Doing well, stable  P:   Continue with current PP plan of care. Carb modified diet. CBG, fasting and 1 hr after meals Niferex 150 mg daily and Magnesium oxide 400 mg daily Continue to see lactation   Casilda Carls, SNM 03/04/2018 , 11:53 AM

## 2018-03-04 NOTE — Lactation Note (Signed)
This note was copied from a baby's chart. Lactation Consultation Note  Patient Name: Allison Reed [id number] Date: 03/04/2018   Visitors in room.  Mother resting.  FOB request LC come back later.     Maternal Data    Feeding    LATCH Score                   Interventions    Lactation Tools Discussed/Used     Consult Status      Vivianne Master [facility] 03/04/2018, 10:42 AM

## 2018-03-05 LAB — GLUCOSE, CAPILLARY
GLUCOSE-CAPILLARY: 135 mg/dL — AB (ref 65–99)
GLUCOSE-CAPILLARY: 206 mg/dL — AB (ref 65–99)
GLUCOSE-CAPILLARY: 118 mg/dL — AB (ref 65–99)
GLUCOSE-CAPILLARY: 157 mg/dL — AB (ref 65–99)

## 2018-03-05 MED ORDER — METFORMIN HCL 500 MG PO TABS
500.0000 mg | ORAL_TABLET | Freq: Every day | ORAL | Status: DC
  Administered 2018-03-05 – 2018-03-06 (×2): 500 mg via ORAL
  Filled 2018-03-05 (×3): qty 1

## 2018-03-05 MED ORDER — GUAIFENESIN-DM 100-10 MG/5ML PO SYRP
5.0000 mL | ORAL_SOLUTION | Freq: Four times a day (QID) | ORAL | Status: DC | PRN
  Administered 2018-03-05 – 2018-03-06 (×3): 5 mL via ORAL
  Filled 2018-03-05 (×4): qty 5

## 2018-03-05 MED ORDER — METFORMIN HCL 500 MG PO TABS
1000.0000 mg | ORAL_TABLET | Freq: Two times a day (BID) | ORAL | Status: DC
  Administered 2018-03-05 – 2018-03-06 (×3): 1000 mg via ORAL
  Filled 2018-03-05 (×5): qty 2

## 2018-03-05 NOTE — Progress Notes (Signed)
S:  Reports feeling well overall.  Feels like she was able to get some more rest last night. Some c/o of incisional pain, well controlled with ibuprofen.  Tolerating carb modified diet without N/V.  Up ad lib/ambulating.  Walked around the halls yesterday. Voiding without difficulty.  Passing flatus.  No BM  Reports bleeding as light, without clots.  Reports non-productive cough.  Working on breastfeeding.  Feels like latch is getting better.  Continues to pump and feed EBM.  O: Vital signs BP 125/77   Pulse 89   Temp 97.7 F (36.5 C)   Resp 18   Ht 5\' 7"  (1.702 m)   Wt 82.1 kg (181 lb)   SpO2 99%   Breastfeeding? Unknown   BMI 28.35 kg/m  Labs  Recent Labs    03/02/18 2332 03/04/18 0526  WBC 7.1 8.5  HGB 12.1 10.3*  PLT 191 176   BS over last 24 hours:  Fasting 135, 142-167 1 hour PP  Alert and O x 3. CV:  RRR, no murmurs Lungs:  CTA bilaterally, non-labored Abdomen: soft, non-tender, non-distended. Fundus: firm, non-tender, U- 2 Lochia light, without clots. Perineum:  No edema. Extremities:  No edema Incision:  Warm, dry, well-approximated.  Honeycomb dressing in place.  No increased drainage or s/s of infection noted.  A: PP Post-op Day 2, s/p repeat c/s Type 2 DM ABL Anemia Cough Breastfeeding, pumping, supplementing with formula   P:   Continue with current PP plan of care. Continue with carb modified diet Add metformin 500 mg at lunch time Monitor BS-consult endocrinology if needed Continue Niferex and Mag Ox Change cough medicine from Delsym to Robitussin DM See lactation as needed  Casilda Carls, SNM 03/05/2018 , 8:28 AM

## 2018-03-05 NOTE — Progress Notes (Signed)
With assessment of lung sounds,this RN heard wheezing in right lateral ower lobe. FOB is a Electrical engineer and asked if he could listen. This RN allowed him to listen and lung sounds clear after a productive cough. pts Temp is 97.8 and the Pt  will F/U with MD.

## 2018-03-05 NOTE — Progress Notes (Signed)
Discussed CBG orders with Pt and FOB who is a MD. Pt wishes to have CBGs checked before meals and at bedtime.

## 2018-03-06 LAB — GLUCOSE, CAPILLARY
Glucose-Capillary: 113 mg/dL — ABNORMAL HIGH (ref 65–99)
Glucose-Capillary: 127 mg/dL — ABNORMAL HIGH (ref 65–99)
Glucose-Capillary: 110 mg/dL — ABNORMAL HIGH (ref 65–99)

## 2018-03-06 NOTE — Discharge Summary (Signed)
Obstetric Discharge Summary   Patient Name: Allison Reed DOB: 07-19-1976 MRN: [id number]  Date of Admission: 03/02/2018 Date of Discharge: 03/06/2018 Date of Delivery: 03/03/2018 Gestational Age at Delivery: [redacted]w[redacted]d  Primary OB: [facility] - Dr. Benjie Karvonen  Antepartum complications:  - Type II DM: Insulin/Medication controlled during pregnancy; followed by endocrinology  - IVF with PGS - LGA - Hx. Of LTCS - Abdominal cerclage with hx of incompetent cervix  - GBS positive  - UTI in pregnancy  Prenatal Labs:  ABO, Rh: --/--/B POS (03/24 2332) Antibody: NEG (03/24 2332) Rubella: Immune (09/27 0000) RPR: Nonreactive (09/27 0000)  HBsAg: Negative (09/27 0000)  HIV: Non-reactive (09/27 0000)  GBS:   positive  Admitting Diagnosis: 36+2 weeks PTL, hx. Of LTCS, abdominal cerclage, IVF, AMA, Type II DM  Secondary Diagnoses: Patient Active Problem List   Diagnosis Date Noted  . Premature uterine contractions causing threatened premature labor in third trimester 03/03/2018  . Previous cesarean delivery, delivered 03/03/2018  . Postpartum care following cesarean delivery (3/25) 03/03/2018  . Status post repeat low transverse cesarean section 03/03/2018  . DM (diabetes mellitus) in pregnancy 03/02/2018  . Incompetent cervix in pregnancy 03/02/2018  . Premature uterine contractions in third trimester, antepartum 03/02/2018  . Active labor 10/12/2015   Date of Delivery: 03/03/2018 Delivered By: Dr. Mody/ T. Mel Almond, CNM assist  Delivery Type: repeat cesarean section, low transverse incision  Newborn Data: Live born female  Birth Weight: 9 lb 1.9 oz (4135 g) APGAR: 62, 9  Newborn Delivery   Birth date/time:  03/03/2018 04:31:00 Delivery type:  C-Section, Vacuum Assisted C-section categorization:  Repeat      Hospital/Postpartum Course  (Cesarean Section):  Pt. Admitted 03/02/18 with PTL - attempted IVF, Terbutaline, BMZ x1, and Procardia.  However, labor progressed, contractions  increased and became more painful, and the decision for repeat LTCS was made.  Please see notes for further details. Patient had an uncomplicated postpartum course.  By time of discharge on POD#3, her pain was controlled on oral pain medications; she had appropriate lochia and was ambulating, voiding without difficulty, tolerating regular diet and passing flatus.   She was deemed stable for discharge to home.     Labs: CBC Latest Ref Rng & Units 03/04/2018 03/02/2018 10/11/2017  WBC 4.0 - 10.5 K/uL 8.5 7.1 7.7  Hemoglobin 12.0 - 15.0 g/dL 10.3(L) 12.1 12.4  Hematocrit 36.0 - 46.0 % 31.8(L) 35.9(L) 35.1(L)  Platelets 150 - 400 K/uL 176 191 256   B POS  Physical exam:  BP 102/68 (BP Location: Right Arm)   Pulse 87   Temp 98.3 F (36.8 C) (Oral)   Resp 18   Ht 5\' 7"  (1.702 m)   Wt 82.1 kg (181 lb)   SpO2 97%   Breastfeeding? Unknown   BMI 28.35 kg/m  General: alert and no distress Pulm: normal respiratory effort Lochia: appropriate Abdomen: soft, NT Uterine Fundus: firm, below umbilicus Perineum: healing well, no significant erythema, no significant edema Incision: c/d/i, healing well, no significant drainage, no dehiscence, no significant erythema Extremities: No evidence of DVT seen on physical exam. No lower extremity edema.   Disposition: stable, discharge to home Baby Feeding: breast milk ormula Baby Disposition: possibly will go home today on phototherapy depending on bilirubin level at 3pm; otherwise, will stay as a baby patient   Contraception: unsure; hx. Of infertility   Rh Immune globulin given: N/A Rubella vaccine given: N/A Tdap vaccine given in AP or PP setting: UTD Flu vaccine given in  AP or PP setting: not on file   Plan:  Marquis Diles was discharged to home in good condition. Follow-up appointment at [facility] in 6 weeks.  Discharge Instructions: Per After Visit Summary. Refer to After Visit Summary and [facility] discharge booklet  Activity:  Advance as tolerated. Pelvic rest for 6 weeks.   Diet: Regular, Heart Healthy Discharge Medications: Allergies as of 03/06/2018      Reactions   Betadine [povidone Iodine] Rash   Tape Rash      Medication List    STOP taking these medications   LEVEMIR FLEXTOUCH 100 UNIT/ML Pen Generic drug:  Insulin Detemir   NOVOLOG FLEXPEN 100 UNIT/ML FlexPen Generic drug:  insulin aspart     TAKE these medications   acetaminophen 500 MG tablet Commonly known as:  TYLENOL Take 500 mg by mouth every 6 (six) hours as needed for mild pain or headache.   metFORMIN 1000 MG tablet Commonly known as:  GLUCOPHAGE Take 1,000 mg by mouth 2 (two) times daily. Morning and evening   metFORMIN 500 MG tablet Commonly known as:  GLUCOPHAGE Take 500 mg by mouth daily after lunch.   prenatal multivitamin Tabs tablet Take 1 tablet by mouth daily at 12 noon.      Outpatient follow up:  Follow-up Information    Azucena Fallen, MD. Schedule an appointment as soon as possible for a visit in 6 week(s).   Specialty:  Obstetrics and Gynecology Why:  Postpartum visit  Contact information: [place] [address] [phone]           Signed:  Lars Pinks, MSN, CNM [facility]

## 2018-03-06 NOTE — Progress Notes (Signed)
POSTOPERATIVE DAY # 3 S/P Repeat LTCS; PTL, hx. Of abdominal cerclage; Type II DM, baby boy "Kabir"   S:         Reports feeling well, ready to go home  Reports cough has improved; was productive with yellow sputum, but per patient is getting better             Tolerating po intake / no nausea / no vomiting / + flatus / + BM x 1 this morning   Denies dizziness, SOB, or CP             Bleeding is light             Pain controlled with Motrin             Up ad lib / ambulatory/ voiding QS  Newborn breast feeding  / Circumcision - not planning   O:  VS: BP 102/68 (BP Location: Right Arm)   Pulse 87   Temp 98.3 F (36.8 C) (Oral)   Resp 18   Ht 5\' 7"  (1.702 m)   Wt 82.1 kg (181 lb)   SpO2 97%   Breastfeeding? Unknown   BMI 28.35 kg/m    LABS:               Recent Labs    03/04/18 0526  WBC 8.5  HGB 10.3*  PLT 176               Bloodtype: --/--/B POS (03/24 2332)  Rubella: Immune (09/27 0000)                    CBG (last 3)  Recent Labs    03/06/18 0640 03/06/18 0853 03/06/18 1342  GLUCAP 113* 127* 110*                    Physical Exam:             Alert and Oriented X3  Lungs: Clear and unlabored  Heart: regular rate and rhythm / no murmurs  Abdomen: soft, non-tender, non-distended, active bowel sounds in all quadrants              Fundus: firm, non-tender, U-3             Dressing: honeycomb dsg with steri-strip; old brown drainage noted; otherwise c/d/i              Incision:  approximated with sutures / no erythema / no ecchymosis / no drainage  Perineum: intact  Lochia: small, no clots   Extremities: no edema, no calf pain or tenderness,   A:        POD # 3 S/P Repeat LTCS            Type II DM  Mild ABL Anemia   Doing well, stable   P:        Routine postoperative care              Discharge home today   WOB discharge book given; discharge instructions and warning s/s reviewed   Pt. States she may go back to Metformin 1000mg  BID if sugars are more  stable   Plans to f/u with Dr. Forde Dandy in May and for PRN DM management   Pt. And husband declined rx for Motrin and Percocet: will plan to take OTC Motrin PRN   Recommended OTC iron supplement daily   F/u with Dr. Benjie Karvonen in 6 weeks for [facility] visit  Lars Pinks, MSN, CNM [facility]

## 2018-03-06 NOTE — Lactation Note (Signed)
This note was copied from a baby's chart. Lactation Consultation Note  Patient Name: Allison Reed [id number] Date: 03/06/2018 Reason for consult: Follow-up assessment;Late-preterm 34-36.6wks Mom is both breastfeeding and bottle feeding expressed milk/formula.  She last pumped 20 mls.  Mom has a pump at home and understands importance of pumping with supplementation.  She declined assist this AM and states she feels comfortable with breastfeeding.  Lactation outpatient services and support encouraged prn.  Maternal Data    Feeding    LATCH Score                   Interventions    Lactation Tools Discussed/Used     Consult Status Consult Status: Complete Follow-up type: Call as needed    Ave Filter 03/06/2018, 8:44 AM

## 2018-03-11 ENCOUNTER — Encounter (HOSPITAL_COMMUNITY)
Admission: RE | Admit: 2018-03-11 | Discharge: 2018-03-11 | Disposition: A | Discharge status: Home / Self Care | Payer: BLUE CROSS/BLUE SHIELD | Source: Ambulatory Visit

## 2018-03-11 HISTORY — DX: Gestational diabetes mellitus in pregnancy, unspecified control: O24.419

## 2018-03-12 ENCOUNTER — Inpatient Hospital Stay (HOSPITAL_COMMUNITY)
Admission: RE | Admit: 2018-03-12 | Payer: BLUE CROSS/BLUE SHIELD | Source: Ambulatory Visit | Admitting: Obstetrics & Gynecology

## 2019-02-11 ENCOUNTER — Ambulatory Visit (INDEPENDENT_AMBULATORY_CARE_PROVIDER_SITE_OTHER): Payer: BC Managed Care – PPO | Admitting: Psychology

## 2019-02-11 DIAGNOSIS — F4323 Adjustment disorder with mixed anxiety and depressed mood: Secondary | ICD-10-CM

## 2019-04-07 ENCOUNTER — Ambulatory Visit: Payer: BC Managed Care – PPO | Admitting: Psychology

## 2020-02-15 ENCOUNTER — Ambulatory Visit: Payer: BC Managed Care – PPO | Attending: Internal Medicine

## 2020-02-15 DIAGNOSIS — Z23 Encounter for immunization: Secondary | ICD-10-CM | POA: Insufficient documentation

## 2020-02-15 NOTE — Progress Notes (Signed)
   Covid-19 Vaccination Clinic  Name:  Allison Reed    MRN: [id number] DOB: 01-Feb-1976  02/15/2020  Ms. Allison Reed was observed post Covid-19 immunization for 15 minutes without incident. She was provided with Vaccine Information Sheet and instruction to access the V-Safe system.   Ms. Allison Reed was instructed to call 911 with any severe reactions post vaccine: Marland Kitchen Difficulty breathing  . Swelling of face and throat  . A fast heartbeat  . A bad rash all over body  . Dizziness and weakness   Immunizations Administered    Name Date Dose VIS Date Route   Pfizer COVID-19 Vaccine 02/15/2020  3:24 PM 0.3 mL 11/20/2019 Intramuscular   Manufacturer: [place]   Lot: [id number]   [place]: [id number]

## 2020-03-08 ENCOUNTER — Ambulatory Visit: Payer: BC Managed Care – PPO | Attending: Internal Medicine

## 2020-03-08 DIAGNOSIS — Z23 Encounter for immunization: Secondary | ICD-10-CM

## 2020-03-08 NOTE — Progress Notes (Signed)
   Covid-19 Vaccination Clinic  Name:  Allison Reed    MRN: [id number] DOB: [birth year]  03/08/2020  Ms. Allison Reed was observed post Covid-19 immunization for 15 minutes without incident. She was provided with Vaccine Information Sheet and instruction to access the V-Safe system.   Ms. Allison Reed was instructed to call 911 with any severe reactions post vaccine: Marland Kitchen Difficulty breathing  . Swelling of face and throat  . A fast heartbeat  . A bad rash all over body  . Dizziness and weakness   Immunizations Administered    Name Date Dose VIS Date Route   Pfizer COVID-19 Vaccine 03/08/2020  1:52 PM 0.3 mL 11/20/2019 Intramuscular   Manufacturer: [employer]   Lot: [id number]   [place]: [id number]

## 2020-03-16 ENCOUNTER — Ambulatory Visit: Payer: Self-pay

## 2020-10-10 ENCOUNTER — Other Ambulatory Visit: Payer: Self-pay

## 2020-10-10 DIAGNOSIS — Z20822 Contact with and (suspected) exposure to covid-19: Secondary | ICD-10-CM

## 2020-10-11 LAB — NOVEL CORONAVIRUS, NAA: SARS-CoV-2, NAA: NOT DETECTED

## 2020-10-11 LAB — SARS-COV-2, NAA 2 DAY TAT

## 2020-12-10 HISTORY — PX: BREAST BIOPSY: SHX20

## 2020-12-10 HISTORY — PX: MASTECTOMY: SHX3

## 2021-06-02 ENCOUNTER — Other Ambulatory Visit: Payer: Self-pay | Admitting: Obstetrics & Gynecology

## 2021-06-02 DIAGNOSIS — N6310 Unspecified lump in the right breast, unspecified quadrant: Secondary | ICD-10-CM

## 2021-06-07 ENCOUNTER — Other Ambulatory Visit: Payer: Self-pay

## 2021-06-07 ENCOUNTER — Ambulatory Visit: Payer: BC Managed Care – PPO

## 2021-06-07 ENCOUNTER — Ambulatory Visit
Admission: RE | Admit: 2021-06-07 | Discharge: 2021-06-07 | Disposition: A | Discharge status: Home / Self Care | Payer: 59 | Source: Ambulatory Visit | Attending: Obstetrics & Gynecology | Admitting: Obstetrics & Gynecology

## 2021-06-07 DIAGNOSIS — N6310 Unspecified lump in the right breast, unspecified quadrant: Secondary | ICD-10-CM

## 2021-06-08 ENCOUNTER — Other Ambulatory Visit: Payer: Self-pay | Admitting: Obstetrics & Gynecology

## 2021-06-08 ENCOUNTER — Ambulatory Visit
Admission: RE | Admit: 2021-06-08 | Discharge: 2021-06-08 | Disposition: A | Discharge status: Home / Self Care | Payer: 59 | Source: Ambulatory Visit | Attending: Obstetrics & Gynecology | Admitting: Obstetrics & Gynecology

## 2021-06-08 DIAGNOSIS — N6310 Unspecified lump in the right breast, unspecified quadrant: Secondary | ICD-10-CM

## 2021-06-09 DIAGNOSIS — C801 Malignant (primary) neoplasm, unspecified: Secondary | ICD-10-CM

## 2021-06-09 HISTORY — DX: Malignant (primary) neoplasm, unspecified: C80.1

## 2021-06-14 ENCOUNTER — Telehealth: Payer: Self-pay | Admitting: *Deleted

## 2021-06-14 ENCOUNTER — Ambulatory Visit: Payer: 59 | Admitting: Hematology and Oncology

## 2021-06-14 ENCOUNTER — Other Ambulatory Visit: Payer: Self-pay | Admitting: *Deleted

## 2021-06-14 ENCOUNTER — Encounter: Payer: Self-pay | Admitting: *Deleted

## 2021-06-14 DIAGNOSIS — Z1239 Encounter for other screening for malignant neoplasm of breast: Secondary | ICD-10-CM

## 2021-06-14 NOTE — Telephone Encounter (Signed)
Called pt with appt to see Dr. Lindi Adie on 7.13.22 for new breast/LN mass. Confimred appt date and time. Provided directions and instructions. Provided navigation resources and contact information.

## 2021-06-15 ENCOUNTER — Telehealth: Payer: Self-pay | Admitting: Hematology and Oncology

## 2021-06-15 NOTE — Telephone Encounter (Signed)
Oncology Patient has a breast lump with axillary lymphadenopathy. This is highly suspicious for breast cancer.  Biopsy has been scheduled for 06/16/2021. She has been experiencing upper back discomfort intermittently and slight shortness of breath to exertion.  Therefore I recommended that we obtain a CT chest along with CT abdomen and pelvis for staging purposes and for evaluation of the upper back pain.

## 2021-06-16 ENCOUNTER — Ambulatory Visit
Admission: RE | Admit: 2021-06-16 | Discharge: 2021-06-16 | Disposition: A | Discharge status: Home / Self Care | Payer: 59 | Source: Ambulatory Visit | Attending: Obstetrics & Gynecology | Admitting: Obstetrics & Gynecology

## 2021-06-16 ENCOUNTER — Other Ambulatory Visit: Payer: Self-pay | Admitting: Obstetrics & Gynecology

## 2021-06-16 ENCOUNTER — Encounter: Payer: Self-pay | Admitting: Hematology and Oncology

## 2021-06-16 ENCOUNTER — Other Ambulatory Visit: Payer: Self-pay

## 2021-06-16 ENCOUNTER — Other Ambulatory Visit: Payer: 59

## 2021-06-16 DIAGNOSIS — N6310 Unspecified lump in the right breast, unspecified quadrant: Secondary | ICD-10-CM

## 2021-06-18 ENCOUNTER — Ambulatory Visit
Admission: RE | Admit: 2021-06-18 | Discharge: 2021-06-18 | Disposition: A | Discharge status: Home / Self Care | Payer: 59 | Source: Ambulatory Visit | Attending: Hematology and Oncology | Admitting: Hematology and Oncology

## 2021-06-18 DIAGNOSIS — Z1239 Encounter for other screening for malignant neoplasm of breast: Secondary | ICD-10-CM

## 2021-06-18 MED ORDER — GADOBUTROL 1 MMOL/ML IV SOLN
8.0000 mL | Freq: Once | INTRAVENOUS | Status: AC | PRN
  Administered 2021-06-18: 8 mL via INTRAVENOUS

## 2021-06-19 ENCOUNTER — Ambulatory Visit
Admission: RE | Admit: 2021-06-19 | Discharge: 2021-06-19 | Disposition: A | Discharge status: Home / Self Care | Payer: 59 | Source: Ambulatory Visit | Attending: Obstetrics & Gynecology | Admitting: Obstetrics & Gynecology

## 2021-06-19 ENCOUNTER — Other Ambulatory Visit: Payer: Self-pay | Admitting: Obstetrics & Gynecology

## 2021-06-19 ENCOUNTER — Other Ambulatory Visit: Payer: Self-pay

## 2021-06-19 DIAGNOSIS — R921 Mammographic calcification found on diagnostic imaging of breast: Secondary | ICD-10-CM

## 2021-06-19 DIAGNOSIS — N6310 Unspecified lump in the right breast, unspecified quadrant: Secondary | ICD-10-CM

## 2021-06-20 ENCOUNTER — Other Ambulatory Visit: Payer: 59

## 2021-06-20 ENCOUNTER — Encounter: Payer: Self-pay | Admitting: *Deleted

## 2021-06-20 NOTE — Progress Notes (Addendum)
[place] NOTE  Patient Care Team: [name], MD as PCP - General (Endocrinology) Mauro Kaufmann, RN as Oncology Nurse Navigator Rockwell Germany, RN as Oncology Nurse Navigator  CHIEF COMPLAINTS/PURPOSE OF CONSULTATION:  Newly diagnosed right breast cancer  HISTORY OF PRESENTING ILLNESS:  Allison Reed 45 y.o. female is here because of recent diagnosis of invasive ductal carcinoma of the right breast. She palpated a lump in her right breast. Diagnostic mammogram and [place] on 06/08/21 showed irregular hypoechoic lesions in the right breast at 10 o'clock 8 cm from nipple palpable area measuring 2.7 x 1.7 x 1.1 cm, at 11 o'clock 6 cm from nipple measuring 1.6 x 0.8 x 2.1 cm., and at 12 o'clock 8 cm from nipple measuring 1.9 x 0.4 x 2.2 cm with a single abnormal right axillary lymph node without normal cortex. Biopsy on 06/16/21 showed grade 2 IDC and high grade DCIS with calcifications and necrosis.  She had a breast MRI which showed 8.8 cm of mass or non-mass enhancement along with 1 abnormal axillary lymph node.  There were also findings in the left breast measuring 0.8 and 0.5 cm.  She presents to the clinic today for initial evaluation and discussion of treatment options.   I reviewed her records extensively and collaborated the history with the patient.  SUMMARY OF ONCOLOGIC HISTORY: Oncology History Overview Note  Ductal carcinoma in situ of the right breast  She palpated a lump in her right breast. Diagnostic mammogram and [place] on 06/08/21 showed irregular hypoechoic lesions in the right breast with a single abnormal right axillary lymph node without normal cortex. Biopsy on 06/16/21 showed high grade DCIS with calcifications and necrosis.   Malignant neoplasm of overlapping sites of right breast in female, estrogen receptor positive ([place])  06/16/2021 Initial Diagnosis   Palpable right breast mass: Breast MRI revealed extensive involvement of the right breast  with mass and non-mass enhancement superior right breast mass measures 4.9 x 1.2 cm and together with non-mass enhancement measured 8.8 cm, enhancing mass LOQ 1.3 cm, left breast indeterminate 0.9 cm mass and a 0.8 cm mass UOQ, single abnormal lymph node Biopsy 10:00, 11:00 and 12:00: IDC with DCIS, biopsy right axillary lymph node: IDC, ER 75 to 95%, PR 90 to 95%, Ki-67 25%, HER2 negative on 1 biopsy and 2+ by IHC and FISH pending   06/21/2021 Cancer Staging   Staging form: Breast, AJCC 8th Edition - Clinical stage from 06/21/2021: Stage IIA (cT3, cN1, cM0, G2, ER+, PR+, HER2-) - Signed by [name], MD on 06/21/2021  Histologic grading system: 3 grade system      MEDICAL HISTORY:  Past Medical History:  Diagnosis Date   Diabetes mellitus without complication ([place])    Type 2   Gestational diabetes    Headache    otc med prn   Heartburn in pregnancy    Incompetent cervix in pregnancy 04/2015   Missed abortion    no surgery required   Postpartum care following cesarean delivery (11/2) 10/12/2015   UTI in pregnancy 03/2015   recent UTI, treatment completed    SURGICAL HISTORY: Past Surgical History:  Procedure Laterality Date   APPENDECTOMY  2006   CERCLAGE LAPAROSCOPIC ABDOMINAL N/A 04/15/2015   Procedure: LAPAROSCOPIC TRANSABDOMINAL [place];  Surgeon: Governor Specking, MD;  Location: [facility];  Service: Gynecology;  Laterality: N/A;   CERVICAL CERCLAGE  02/2011   vaginal   CESAREAN SECTION N/A 10/12/2015   Procedure: CESAREAN SECTION;  Surgeon: Princess Bruins, MD;  Location: [facility];  Service: Obstetrics;  Laterality: N/A;   CESAREAN SECTION N/A 03/03/2018   Procedure: Repeat CESAREAN SECTION/Removal of Abdominal Cerclage;  Surgeon: Azucena Fallen, MD;  Location: [facility];  Service: Obstetrics;  Laterality: N/A;  EDD: 03/28/18   WISDOM TOOTH EXTRACTION  2001    SOCIAL HISTORY: Social History   Socioeconomic History   Marital status: Married    Spouse  name: Not on file   Number of children: Not on file   Years of education: Not on file   Highest education level: Not on file  Occupational History   Not on file  Tobacco Use   Smoking status: Never   Smokeless tobacco: Never  Substance and Sexual Activity   Alcohol use: Yes    Comment: social   Drug use: No   Sexual activity: Yes    Birth control/protection: None    Comment: pregnant - approx 11wks 5/7 per patient on 04/14/15  Other Topics Concern   Not on file  Social History Narrative   Not on file   Social Determinants of Health   Financial Resource Strain: Not on file  Food Insecurity: Not on file  Transportation Needs: Not on file  Physical Activity: Not on file  Stress: Not on file  Social Connections: Not on file  Intimate Partner Violence: Not on file    FAMILY HISTORY: Family History  Problem Relation Age of Onset   Diabetes Father     ALLERGIES:  is allergic to betadine [povidone iodine] and tape.  MEDICATIONS:  Current Outpatient Medications  Medication Sig Dispense Refill   Empagliflozin-metFORMIN HCl ER (SYNJARDY XR) 09-999 MG TB24 Take by mouth.     sitaGLIPtin (JANUVIA) 100 MG tablet Take 100 mg by mouth daily.     tamoxifen (NOLVADEX) 20 MG tablet Take 1 tablet (20 mg total) by mouth daily. 30 tablet 1   No current facility-administered medications for this visit.    REVIEW OF SYSTEMS:   Constitutional: Denies fevers, chills or abnormal night sweats Eyes: Denies blurriness of vision, double vision or watery eyes Ears, nose, mouth, throat, and face: Denies mucositis or sore throat Respiratory: Denies cough, dyspnea or wheezes Cardiovascular: Denies palpitation, chest discomfort or lower extremity swelling Gastrointestinal:  Denies nausea, heartburn or change in bowel habits Skin: Denies abnormal skin rashes Lymphatics: Denies new lymphadenopathy or easy bruising Neurological:Denies numbness, tingling or new weaknesses Behavioral/Psych: Mood is  stable, no new changes  Breast: Palpable lump in the right breast All other systems were reviewed with the patient and are negative.  PHYSICAL EXAMINATION: ECOG PERFORMANCE STATUS: 1 - Symptomatic but completely ambulatory  There were no vitals filed for this visit. There were no vitals filed for this visit.   LABORATORY DATA:  I have reviewed the data as listed Lab Results  Component Value Date   WBC 6.4 06/21/2021   HGB 15.8 (H) 06/21/2021   HCT 47.1 (H) 06/21/2021   MCV 81.1 06/21/2021   PLT 276 06/21/2021   Lab Results  Component Value Date   NA 140 06/21/2021   K 4.2 06/21/2021   CL 103 06/21/2021   CO2 25 06/21/2021    RADIOGRAPHIC STUDIES: I have personally reviewed the radiological reports and agreed with the findings in the report.  ASSESSMENT AND PLAN:  Malignant neoplasm of overlapping sites of right breast in female, estrogen receptor positive ([place]) 06/16/2021: Palpable right breast mass: Breast MRI revealed extensive involvement of the right breast with  mass and non-mass enhancement superior right breast mass measures 4.9 x 1.2 cm and together with non-mass enhancement measured 8.8 cm, enhancing mass LOQ 1.3 cm, left breast indeterminate 0.9 cm mass and a 0.8 cm mass UOQ, single abnormal lymph node Biopsy 10:00, 11:00 and 12:00: IDC with DCIS, biopsy right axillary lymph node: IDC, ER 75 to 95%, PR 90 to 95%, Ki-67 25%, HER2 negative on 1 biopsy and 2+ by IHC and FISH pending  Pathology and radiology counseling: Discussed with the patient, the details of pathology including the type of breast cancer,the clinical staging, the significance of ER, PR and HER-2/neu receptors and the implications for treatment. After reviewing the pathology in detail, we proceeded to discuss the different treatment options between surgery, radiation, chemotherapy, antiestrogen therapies.  Treatment plan: 1.  Biopsy of the left breast lesions 2. right mastectomy with reconstruction and  targeted node dissection 3.  MammaPrint testing to determine if she would benefit from adjuvant chemo 4.  Adjuvant radiation 5.  Followed by adjuvant antiestrogen therapy with CDK inhibitor abemaciclib  Genetic testing CT CAP and bone scan are scheduled for next week. She has an appointment to see Dr. Donne Hazel to discuss surgical options.  Because it could be a few weeks until she gets surgery, I recommended that she start on tamoxifen today.  She will start at half a tablet daily and then increase to the full tablet if she tolerates it well.  She knows to stop the tamoxifen 1 week before surgery.  Return to clinic after surgery to discuss the final pathology report.   All questions were answered. The patient knows to call the clinic with any problems, questions or concerns.   Rulon Eisenmenger, MD, MPH 06/21/2021    I, Thana Ates, am acting as scribe for [name], MD.  I have reviewed the above documentation for accuracy and completeness, and I agree with the above.

## 2021-06-21 ENCOUNTER — Ambulatory Visit (HOSPITAL_BASED_OUTPATIENT_CLINIC_OR_DEPARTMENT_OTHER): Payer: 59 | Admitting: Genetic Counselor

## 2021-06-21 ENCOUNTER — Other Ambulatory Visit: Payer: Self-pay | Admitting: *Deleted

## 2021-06-21 ENCOUNTER — Ambulatory Visit: Payer: 59 | Admitting: Hematology and Oncology

## 2021-06-21 ENCOUNTER — Encounter: Payer: Self-pay | Admitting: *Deleted

## 2021-06-21 ENCOUNTER — Inpatient Hospital Stay: Payer: 59 | Attending: Hematology and Oncology

## 2021-06-21 ENCOUNTER — Inpatient Hospital Stay (HOSPITAL_BASED_OUTPATIENT_CLINIC_OR_DEPARTMENT_OTHER): Payer: 59 | Admitting: Hematology and Oncology

## 2021-06-21 ENCOUNTER — Other Ambulatory Visit: Payer: 59

## 2021-06-21 ENCOUNTER — Other Ambulatory Visit: Payer: Self-pay

## 2021-06-21 DIAGNOSIS — Z170 Estrogen receptor positive status [ER+]: Secondary | ICD-10-CM | POA: Insufficient documentation

## 2021-06-21 DIAGNOSIS — Z79810 Long term (current) use of selective estrogen receptor modulators (SERMs): Secondary | ICD-10-CM | POA: Insufficient documentation

## 2021-06-21 DIAGNOSIS — E119 Type 2 diabetes mellitus without complications: Secondary | ICD-10-CM | POA: Diagnosis not present

## 2021-06-21 DIAGNOSIS — R928 Other abnormal and inconclusive findings on diagnostic imaging of breast: Secondary | ICD-10-CM

## 2021-06-21 DIAGNOSIS — Z808 Family history of malignant neoplasm of other organs or systems: Secondary | ICD-10-CM | POA: Diagnosis not present

## 2021-06-21 DIAGNOSIS — C50811 Malignant neoplasm of overlapping sites of right female breast: Secondary | ICD-10-CM

## 2021-06-21 DIAGNOSIS — Z8489 Family history of other specified conditions: Secondary | ICD-10-CM

## 2021-06-21 LAB — CMP (CANCER CENTER ONLY)
ALT: 49 U/L — ABNORMAL HIGH (ref 0–44)
AST: 33 U/L (ref 15–41)
Albumin: 4 g/dL (ref 3.5–5.0)
Alkaline Phosphatase: 52 U/L (ref 38–126)
Anion gap: 12 (ref 5–15)
BUN: 7 mg/dL (ref 6–20)
CO2: 25 mmol/L (ref 22–32)
Calcium: 9.3 mg/dL (ref 8.9–10.3)
Chloride: 103 mmol/L (ref 98–111)
Creatinine: 0.74 mg/dL (ref 0.44–1.00)
GFR, Estimated: >60 mL/min (ref >60)
Glucose, Bld: 167 mg/dL — ABNORMAL HIGH (ref 70–99)
Potassium: 4.2 mmol/L (ref 3.5–5.1)
Sodium: 140 mmol/L (ref 135–145)
Total Bilirubin: 0.3 mg/dL (ref 0.3–1.2)
Total Protein: 7.4 g/dL (ref 6.5–8.1)

## 2021-06-21 LAB — CBC WITH DIFFERENTIAL (CANCER CENTER ONLY)
Abs Immature Granulocytes: 0.01 10*3/uL (ref 0.00–0.07)
Basophils Absolute: 0 10*3/uL (ref 0.0–0.1)
Basophils Relative: 1 %
Eosinophils Absolute: 0.2 10*3/uL (ref 0.0–0.5)
Eosinophils Relative: 3 %
HCT: 47.1 % — ABNORMAL HIGH (ref 36.0–46.0)
Hemoglobin: 15.8 g/dL — ABNORMAL HIGH (ref 12.0–15.0)
Immature Granulocytes: 0 %
Lymphocytes Relative: 48 %
Lymphs Abs: 3.1 10*3/uL (ref 0.7–4.0)
MCH: 27.2 pg (ref 26.0–34.0)
MCHC: 33.5 g/dL (ref 30.0–36.0)
MCV: 81.1 fL (ref 80.0–100.0)
Monocytes Absolute: 0.4 10*3/uL (ref 0.1–1.0)
Monocytes Relative: 6 %
Neutro Abs: 2.7 10*3/uL (ref 1.7–7.7)
Neutrophils Relative %: 42 %
Platelet Count: 276 10*3/uL (ref 150–400)
RBC: 5.81 MIL/uL — ABNORMAL HIGH (ref 3.87–5.11)
RDW: 13.5 % (ref 11.5–15.5)
WBC Count: 6.4 10*3/uL (ref 4.0–10.5)
nRBC: 0 % (ref 0.0–0.2)

## 2021-06-21 LAB — GENETIC SCREENING ORDER

## 2021-06-21 MED ORDER — TAMOXIFEN CITRATE 20 MG PO TABS: 20.0000 mg | ORAL_TABLET | Freq: Every day | ORAL | 1 refills | Status: DC

## 2021-06-21 NOTE — Assessment & Plan Note (Signed)
06/16/2021: Palpable right breast mass: Breast MRI revealed extensive involvement of the right breast with mass and non-mass enhancement superior right breast mass measures 4.9 x 1.2 cm and together with non-mass enhancement measured 8.8 cm, enhancing mass LOQ 1.3 cm, left breast indeterminate 0.9 cm mass and a 0.8 cm mass UOQ, single abnormal lymph node Biopsy 10:00, 11:00 and 12:00: IDC with DCIS, biopsy right axillary lymph node: IDC, ER 75 to 95%, PR 90 to 95%, Ki-67 25%, HER2 negative on 1 biopsy and 2+ by IHC and FISH pending  Pathology and radiology counseling: Discussed with the patient, the details of pathology including the type of breast cancer,the clinical staging, the significance of ER, PR and HER-2/neu receptors and the implications for treatment. After reviewing the pathology in detail, we proceeded to discuss the different treatment options between surgery, radiation, chemotherapy, antiestrogen therapies.  Treatment plan: 1.  Biopsy of the left breast lesions 2. right mastectomy with reconstruction and targeted node dissection 3.  MammaPrint testing to determine if she would benefit from adjuvant chemo 4.  Adjuvant radiation 5.  Followed by adjuvant antiestrogen therapy with CDK inhibitor abemaciclib  Genetic testing CT CAP and bone scan are scheduled for next week. She has an appointment to see Dr. Donne Hazel to discuss surgical options. Return to clinic after surgery to discuss the final pathology report.

## 2021-06-22 ENCOUNTER — Encounter: Payer: Self-pay | Admitting: Genetic Counselor

## 2021-06-22 ENCOUNTER — Other Ambulatory Visit: Payer: Self-pay | Admitting: *Deleted

## 2021-06-22 DIAGNOSIS — Z170 Estrogen receptor positive status [ER+]: Secondary | ICD-10-CM

## 2021-06-22 DIAGNOSIS — C50811 Malignant neoplasm of overlapping sites of right female breast: Secondary | ICD-10-CM

## 2021-06-22 DIAGNOSIS — Z8489 Family history of other specified conditions: Secondary | ICD-10-CM | POA: Insufficient documentation

## 2021-06-22 DIAGNOSIS — Z808 Family history of malignant neoplasm of other organs or systems: Secondary | ICD-10-CM | POA: Insufficient documentation

## 2021-06-22 MED ORDER — ZOLEDRONIC ACID 4 MG/100ML IV SOLN
INTRAVENOUS | Status: AC
  Filled 2021-06-22: qty 100

## 2021-06-22 NOTE — Progress Notes (Signed)
REFERRING PROVIDER: [name], MD [place],  [place] [id number]  PRIMARY PROVIDER:  [name], MD  PRIMARY REASON FOR VISIT:  1. Malignant neoplasm of overlapping sites of right breast in female, estrogen receptor positive ([place])   2. Family history of skin cancer   3. Family history of brain tumor      HISTORY OF PRESENT ILLNESS:   Allison Reed, a 45 y.o. female, was seen for a [facility] cancer genetics consultation at the request of Dr. Lindi Adie due to a personal and family history of cancer.  Allison Reed presents to clinic today to discuss the possibility of a hereditary predisposition to cancer, genetic testing, and to further clarify her future cancer risks, as well as potential cancer risks for family members.   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 The tumor is ER+/PR+/Her2-. The treatment plan includes the following:  1.  Biopsy of the left breast lesions 2. right mastectomy with reconstruction and targeted node dissection 3.  MammaPrint testing to determine if she would benefit from adjuvant chemo 4.  Adjuvant radiation 5.  Followed by adjuvant antiestrogen therapy with CDK inhibitor abemaciclib   CANCER HISTORY:  Oncology History Overview Note  Ductal carcinoma in situ of the right breast  She palpated a lump in her right breast. Diagnostic mammogram and [place] on 06/08/21 showed irregular hypoechoic lesions in the right breast with a single abnormal right axillary lymph node without normal cortex. Biopsy on 06/16/21 showed high grade DCIS with calcifications and necrosis.   Malignant neoplasm of overlapping sites of right breast in female, estrogen receptor positive ([place])  06/16/2021 Initial Diagnosis   Palpable right breast mass: Breast MRI revealed extensive involvement of the right breast with mass and non-mass enhancement superior right breast mass  measures 4.9 x 1.2 cm and together with non-mass enhancement measured 8.8 cm, enhancing mass LOQ 1.3 cm, left breast indeterminate 0.9 cm mass and a 0.8 cm mass UOQ, single abnormal lymph node Biopsy 10:00, 11:00 and 12:00: IDC with DCIS, biopsy right axillary lymph node: IDC, ER 75 to 95%, PR 90 to 95%, Ki-67 25%, HER2 negative on 1 biopsy and 2+ by IHC and FISH pending   06/21/2021 Cancer Staging   Staging form: Breast, AJCC 8th Edition - Clinical stage from 06/21/2021: Stage IIA (cT3, cN1, cM0, G2, ER+, PR+, HER2-) - Signed by [name], MD on 06/21/2021  Histologic grading system: 3 grade system       RISK FACTORS:  Menarche was at age 65.  First live birth at age 44.  OCP use for approximately 3 years.  Ovaries intact: yes.  Hysterectomy: no.  Menopausal status: premenopausal.  HRT use: 0 years. Colonoscopy: no; not examined. Mammogram within the last year: yes. Any excessive radiation exposure in the past: no.   Past Medical History:  Diagnosis Date   Diabetes mellitus without complication ([place])    Type 2   Family history of brain tumor    Family history of skin cancer    Gestational diabetes    Headache    otc med prn   Heartburn in pregnancy    Incompetent cervix in pregnancy 04/2015   Missed abortion    no surgery required   Postpartum care following cesarean delivery (11/2) 10/12/2015   UTI in pregnancy 03/2015   recent UTI, treatment completed    Past Surgical History:  Procedure Laterality Date   APPENDECTOMY  2006   CERCLAGE LAPAROSCOPIC ABDOMINAL N/A 04/15/2015   Procedure: LAPAROSCOPIC TRANSABDOMINAL CERVCOISTHMIC CERCLAGE;  Surgeon: Governor Specking, MD;  Location: [facility];  Service: Gynecology;  Laterality: N/A;   CERVICAL CERCLAGE  02/2011   vaginal   CESAREAN SECTION N/A 10/12/2015   Procedure: CESAREAN SECTION;  Surgeon: Princess Bruins, MD;  Location: [facility];  Service: Obstetrics;  Laterality: N/A;   CESAREAN SECTION N/A 03/03/2018   Procedure:  Repeat CESAREAN SECTION/Removal of Abdominal Cerclage;  Surgeon: Azucena Fallen, MD;  Location: [facility];  Service: Obstetrics;  Laterality: N/A;  EDD: 03/28/18   WISDOM TOOTH EXTRACTION  2001    Social History   Socioeconomic History   Marital status: Married    Spouse name: Not on file   Number of children: Not on file   Years of education: Not on file   Highest education level: Not on file  Occupational History   Not on file  Tobacco Use   Smoking status: Never   Smokeless tobacco: Never  Substance and Sexual Activity   Alcohol use: Yes    Comment: social   Drug use: No   Sexual activity: Yes    Birth control/protection: None    Comment: pregnant - approx 11wks 5/7 per patient on 04/14/15  Other Topics Concern   Not on file  Social History Narrative   Not on file   Social Determinants of Health   Financial Resource Strain: Not on file  Food Insecurity: Not on file  Transportation Needs: Not on file  Physical Activity: Not on file  Stress: Not on file  Social Connections: Not on file     FAMILY HISTORY:  We obtained a detailed, 4-generation family history.  Significant diagnoses are listed below: Family History  Problem Relation Age of Onset   Diabetes Father    HIV Maternal Uncle    Skin cancer Maternal Uncle 39   HIV Maternal Uncle    Diabetes Maternal Grandmother    Hypertension Maternal Grandmother    Alzheimer's disease Maternal Grandmother    Cirrhosis Maternal Grandfather    Alcoholism Maternal Grandfather    Other Paternal Grandfather        brain hemorrhage   Allison Reed has one daughter (age 60) and one son (age 61). She has one brother (age 36). None of these relatives have had cancer.  Allison Reed mother is alive at age 49 without cancer. There are four maternal aunts and two maternal uncles. One maternal uncle died from skin cancer at age 46. There is no known cancer among maternal cousins. Allison Reed maternal grandmother died in her 54s  without cancer. Her maternal grandfather died in his 73s without cancer. A maternal great-aunt (MGM's sister) died in her 25s with a brain tumor.  Allison Reed father is alive at age 39 without cancer. There is one paternal aunt who has not had cancer, but did have a hysterectomy at age 90 (unknown if ovaries are intact). There is no known cancer among paternal paternal cousins. Allison Reed paternal grandmother died at age 66 without cancer. Her paternal grandfather died at age 23 without cancer.   Allison Reed is unaware of previous family history of genetic testing for hereditary cancer risks. Patient's maternal ancestors are of [place] descent, and paternal ancestors are of [place] descent. There is no reported Ashkenazi Jewish ancestry. There is no known consanguinity.  GENETIC COUNSELING ASSESSMENT: Allison Reed is a 45 y.o. female with a personal  history of young-onset breast cancer and a family history of skin cancer and brain tumor, which is somewhat suggestive of a hereditary cancer syndrome and predisposition to cancer. We, therefore, discussed and recommended the following at today's visit.   DISCUSSION: We discussed that approximately 5-10% of breast cancer is hereditary, with most cases associated with the BRCA1 and BRCA2 genes. There are other genes that can be associated with hereditary breast cancer syndromes. These include ATM, CHEK2, PALB2 etc. We discussed that testing is beneficial for several reasons, including knowing about other cancer risks, identifying potential screening and risk-reduction options that may be appropriate, and to understand if other family members could be at risk for cancer and allow them to undergo genetic testing.  We reviewed the characteristics, features and inheritance patterns of hereditary cancer syndromes. We also discussed genetic testing, including the appropriate family members to test, the process of testing, insurance coverage and turn-around-time for results. We  discussed the implications of a negative, positive and/or variant of uncertain significant result. In order to get genetic test results in a timely manner so that Allison Reed can use these genetic test results for surgical decisions, we recommended Allison Reed pursue genetic testing for the [employer]. Once complete, we recommend Allison Reed pursue reflex genetic testing to the CancerNext-Expanded + RNAinsight gene panel.   The BRCAplus panel offered by [employer] and includes sequencing and deletion/duplication analysis for the following 8 genes: ATM, BRCA1, BRCA2, CDH1, CHEK2, PALB2, PTEN, and TP53. The CancerNext-Expanded + RNAinsight gene panel offered by [employer] and includes sequencing and rearrangement analysis for the following 77 genes: AIP, ALK, APC, ATM, AXIN2, BAP1, BARD1, BLM, BMPR1A, BRCA1, BRCA2, BRIP1, CDC73, CDH1, CDK4, CDKN1B, CDKN2A, CHEK2, CTNNA1, DICER1, FANCC, FH, FLCN, GALNT12, KIF1B, LZTR1, MAX, MEN1, MET, MLH1, MSH2, MSH3, MSH6, MUTYH, NBN, NF1, NF2, NTHL1, PALB2, PHOX2B, PMS2, POT1, PRKAR1A, PTCH1, PTEN, RAD51C, RAD51D, RB1, RECQL, RET, SDHA, SDHAF2, SDHB, SDHC, SDHD, SMAD4, SMARCA4, SMARCB1, SMARCE1, STK11, SUFU, TMEM127, TP53, TSC1, TSC2, VHL and XRCC2 (sequencing and deletion/duplication); EGFR, EGLN1, HOXB13, KIT, MITF, PDGFRA, POLD1 and POLE (sequencing only); EPCAM and GREM1 (deletion/duplication only). RNA data is routinely analyzed for use in variant interpretation for all genes.  Based on Allison Reed's personal and family history of cancer, she meets medical criteria for genetic testing. Despite that she meets criteria, she may still have an out of pocket cost.   PLAN: After considering the risks, benefits, and limitations, Allison Reed provided informed consent to pursue genetic testing and the blood sample was sent to [facility] for analysis of the BRCAplus panel and CancerNext-Expanded + RNAinsight panel. Results should be available within approximately one-two  weeks' time, at which point they will be disclosed by telephone to Allison Reed, as will any additional recommendations warranted by these results. Allison Reed will receive a summary of her genetic counseling visit and a copy of her results once available. This information will also be available in Epic.   Allison Reed questions were answered to her satisfaction today. Our contact information was provided should additional questions or concerns arise. Thank you for the referral and allowing [place] to share in the care of your patient.   Clint Guy, [place], [facility] Licensed, Certified Dispensing [email or web address] Phone: [phone]  The patient was seen for a total of 25 minutes in face-to-face genetic counseling. Patient was seen alone. This patient was discussed with Drs. Magrinat, Lindi Adie and/or Burr Medico who agrees with the above.    _______________________________________________________________________ For Office Staff:  Number of people involved in session: 1 Was an Intern/ student involved with case: no

## 2021-06-23 ENCOUNTER — Other Ambulatory Visit: Payer: Self-pay

## 2021-06-23 ENCOUNTER — Ambulatory Visit (HOSPITAL_COMMUNITY)
Admission: RE | Admit: 2021-06-23 | Discharge: 2021-06-23 | Disposition: A | Discharge status: Home / Self Care | Payer: 59 | Source: Ambulatory Visit | Attending: Hematology and Oncology | Admitting: Hematology and Oncology

## 2021-06-23 ENCOUNTER — Encounter (HOSPITAL_COMMUNITY)
Admission: RE | Admit: 2021-06-23 | Discharge: 2021-06-23 | Disposition: A | Discharge status: Home / Self Care | Payer: 59 | Source: Ambulatory Visit | Attending: Hematology and Oncology | Admitting: Hematology and Oncology

## 2021-06-23 ENCOUNTER — Encounter: Payer: Self-pay | Admitting: Hematology and Oncology

## 2021-06-23 DIAGNOSIS — Z1239 Encounter for other screening for malignant neoplasm of breast: Secondary | ICD-10-CM | POA: Insufficient documentation

## 2021-06-23 MED ORDER — TECHNETIUM TC 99M MEDRONATE IV KIT
20.0000 | PACK | Freq: Once | INTRAVENOUS | Status: AC | PRN
  Administered 2021-06-23: 20 via INTRAVENOUS

## 2021-06-25 ENCOUNTER — Other Ambulatory Visit: Payer: 59

## 2021-06-26 ENCOUNTER — Encounter (HOSPITAL_COMMUNITY): Payer: Self-pay

## 2021-06-26 ENCOUNTER — Ambulatory Visit (HOSPITAL_COMMUNITY)
Admission: RE | Admit: 2021-06-26 | Discharge: 2021-06-26 | Disposition: A | Discharge status: Home / Self Care | Payer: 59 | Source: Ambulatory Visit | Attending: Hematology and Oncology | Admitting: Hematology and Oncology

## 2021-06-26 ENCOUNTER — Encounter (HOSPITAL_COMMUNITY): Payer: Self-pay | Admitting: Radiology

## 2021-06-26 ENCOUNTER — Other Ambulatory Visit: Payer: Self-pay

## 2021-06-26 ENCOUNTER — Encounter: Payer: Self-pay | Admitting: *Deleted

## 2021-06-26 DIAGNOSIS — Z1239 Encounter for other screening for malignant neoplasm of breast: Secondary | ICD-10-CM

## 2021-06-26 DIAGNOSIS — C801 Malignant (primary) neoplasm, unspecified: Secondary | ICD-10-CM | POA: Insufficient documentation

## 2021-06-26 MED ORDER — IOHEXOL 350 MG/ML SOLN
80.0000 mL | Freq: Once | INTRAVENOUS | Status: AC | PRN
  Administered 2021-06-26: 80 mL via INTRAVENOUS

## 2021-06-27 ENCOUNTER — Telehealth: Payer: Self-pay | Admitting: Licensed Clinical Social Worker

## 2021-06-27 NOTE — Telephone Encounter (Signed)
[facility] Clinical Social Work  Clinical Social Work was referred by new patient protocol for assessment of psychosocial needs.  Clinical Social Worker attempted to contact patient by phone  to offer support and assess for needs.   No answer. Left VM with direct contact information.      Emelia Sandoval E Donney Caraveo, LCSW  Clinical Social Worker [facility]        

## 2021-06-28 ENCOUNTER — Telehealth: Payer: Self-pay | Admitting: *Deleted

## 2021-06-28 ENCOUNTER — Encounter: Payer: Self-pay | Admitting: *Deleted

## 2021-06-28 NOTE — Telephone Encounter (Signed)
Left vm for pt regarding ct/bone scan results. Contact information provided for questions or needs.

## 2021-06-29 ENCOUNTER — Ambulatory Visit
Admission: RE | Admit: 2021-06-29 | Discharge: 2021-06-29 | Disposition: A | Discharge status: Home / Self Care | Payer: 59 | Source: Ambulatory Visit | Attending: Hematology and Oncology | Admitting: Hematology and Oncology

## 2021-06-29 ENCOUNTER — Other Ambulatory Visit: Payer: Self-pay | Admitting: Hematology and Oncology

## 2021-06-29 ENCOUNTER — Telehealth: Payer: Self-pay | Admitting: *Deleted

## 2021-06-29 ENCOUNTER — Other Ambulatory Visit: Payer: Self-pay

## 2021-06-29 ENCOUNTER — Encounter: Payer: Self-pay | Admitting: *Deleted

## 2021-06-29 ENCOUNTER — Other Ambulatory Visit: Payer: Self-pay | Admitting: *Deleted

## 2021-06-29 DIAGNOSIS — Z170 Estrogen receptor positive status [ER+]: Secondary | ICD-10-CM

## 2021-06-29 DIAGNOSIS — R928 Other abnormal and inconclusive findings on diagnostic imaging of breast: Secondary | ICD-10-CM

## 2021-06-29 DIAGNOSIS — C50811 Malignant neoplasm of overlapping sites of right female breast: Secondary | ICD-10-CM

## 2021-06-29 NOTE — Telephone Encounter (Signed)
Spoke to pt regarding CT/bone scan results and need for further evaluation with PET scan. Received verbal understanding. Confirm [place] bx for 7/21. No further needs voiced at this time. Order placed for PET scan per Dr. Lindi Adie

## 2021-06-30 ENCOUNTER — Other Ambulatory Visit: Payer: Self-pay | Admitting: Hematology and Oncology

## 2021-06-30 ENCOUNTER — Encounter: Payer: Self-pay | Admitting: *Deleted

## 2021-06-30 ENCOUNTER — Other Ambulatory Visit: Payer: Self-pay | Admitting: General Surgery

## 2021-06-30 ENCOUNTER — Telehealth: Payer: Self-pay | Admitting: Hematology and Oncology

## 2021-06-30 DIAGNOSIS — R928 Other abnormal and inconclusive findings on diagnostic imaging of breast: Secondary | ICD-10-CM

## 2021-06-30 DIAGNOSIS — Z170 Estrogen receptor positive status [ER+]: Secondary | ICD-10-CM

## 2021-06-30 DIAGNOSIS — C50811 Malignant neoplasm of overlapping sites of right female breast: Secondary | ICD-10-CM

## 2021-06-30 NOTE — Telephone Encounter (Signed)
Lvm with change to PET scan appointment, appointment was moved up per Dr. Lindi Adie and Dr. Donne Hazel, left my call back as well as the number to centralized scheduling in case she needs to change appointment

## 2021-07-01 DIAGNOSIS — Z1379 Encounter for other screening for genetic and chromosomal anomalies: Secondary | ICD-10-CM | POA: Insufficient documentation

## 2021-07-03 ENCOUNTER — Encounter: Payer: Self-pay | Admitting: Genetic Counselor

## 2021-07-03 ENCOUNTER — Other Ambulatory Visit: Payer: Self-pay | Admitting: General Surgery

## 2021-07-03 ENCOUNTER — Telehealth: Payer: Self-pay | Admitting: Genetic Counselor

## 2021-07-03 ENCOUNTER — Ambulatory Visit: Payer: Self-pay | Admitting: Genetic Counselor

## 2021-07-03 ENCOUNTER — Encounter: Payer: Self-pay | Admitting: *Deleted

## 2021-07-03 DIAGNOSIS — Z1379 Encounter for other screening for genetic and chromosomal anomalies: Secondary | ICD-10-CM

## 2021-07-03 NOTE — Telephone Encounter (Signed)
LVM that her genetic test results are available and requested that she call back to discuss them.  

## 2021-07-03 NOTE — Progress Notes (Signed)
HPI:  Allison Reed was previously seen in the [place] clinic due to a personal and family history of cancer and concerns regarding a hereditary predisposition to cancer. Please refer to our prior cancer genetics clinic note for more information regarding our discussion, assessment and recommendations, at the time. Allison Reed recent genetic test results were disclosed to her, as were recommendations warranted by these results. These results and recommendations are discussed in more detail below.  CANCER HISTORY:  Oncology History Overview Note  Ductal carcinoma in situ of the right breast  She palpated a lump in her right breast. Diagnostic mammogram and [place] on 06/08/21 showed irregular hypoechoic lesions in the right breast with a single abnormal right axillary lymph node without normal cortex. Biopsy on 06/16/21 showed high grade DCIS with calcifications and necrosis.   Malignant neoplasm of overlapping sites of right breast in female, estrogen receptor positive ([place])  06/16/2021 Initial Diagnosis   Palpable right breast mass: Breast MRI revealed extensive involvement of the right breast with mass and non-mass enhancement superior right breast mass measures 4.9 x 1.2 cm and together with non-mass enhancement measured 8.8 cm, enhancing mass LOQ 1.3 cm, left breast indeterminate 0.9 cm mass and a 0.8 cm mass UOQ, single abnormal lymph node Biopsy 10:00, 11:00 and 12:00: IDC with DCIS, biopsy right axillary lymph node: IDC, ER 75 to 95%, PR 90 to 95%, Ki-67 25%, HER2 negative on 1 biopsy and 2+ by IHC and FISH pending   06/21/2021 Cancer Staging   Staging form: Breast, AJCC 8th Edition - Clinical stage from 06/21/2021: Stage IIA (cT3, cN1, cM0, G2, ER+, PR+, HER2-) - Signed by [name], MD on 06/21/2021  Histologic grading system: 3 grade system    07/01/2021 Genetic Testing   Negative genetic testing:  No pathogenic variants detected on the [employer] BRCAplus panel (report date  07/01/2021) or the [employer] CancerNext-Expanded + RNAinsight panel (report date 07/01/2021).   The BRCAplus panel offered by [employer] and includes sequencing and deletion/duplication analysis for the following 8 genes: ATM, BRCA1, BRCA2, CDH1, CHEK2, PALB2, PTEN, and TP53. The CancerNext-Expanded + RNAinsight gene panel offered by [employer] and includes sequencing and rearrangement analysis for the following 77 genes: AIP, ALK, APC, ATM, AXIN2, BAP1, BARD1, BLM, BMPR1A, BRCA1, BRCA2, BRIP1, CDC73, CDH1, CDK4, CDKN1B, CDKN2A, CHEK2, CTNNA1, DICER1, FANCC, FH, FLCN, GALNT12, KIF1B, LZTR1, MAX, MEN1, MET, MLH1, MSH2, MSH3, MSH6, MUTYH, NBN, NF1, NF2, NTHL1, PALB2, PHOX2B, PMS2, POT1, PRKAR1A, PTCH1, PTEN, RAD51C, RAD51D, RB1, RECQL, RET, SDHA, SDHAF2, SDHB, SDHC, SDHD, SMAD4, SMARCA4, SMARCB1, SMARCE1, STK11, SUFU, TMEM127, TP53, TSC1, TSC2, VHL and XRCC2 (sequencing and deletion/duplication); EGFR, EGLN1, HOXB13, KIT, MITF, PDGFRA, POLD1 and POLE (sequencing only); EPCAM and GREM1 (deletion/duplication only). RNA data is routinely analyzed for use in variant interpretation for all genes.     FAMILY HISTORY:  We obtained a detailed, 4-generation family history.  Significant diagnoses are listed below: Family History  Problem Relation Age of Onset   Diabetes Father    HIV Maternal Uncle    Skin cancer Maternal Uncle 39   HIV Maternal Uncle    Diabetes Maternal Grandmother    Hypertension Maternal Grandmother    Alzheimer's disease Maternal Grandmother    Cirrhosis Maternal Grandfather    Alcoholism Maternal Grandfather    Other Paternal Grandfather        brain hemorrhage   Allison Reed has one daughter (age 5) and one son (age 47). She has one brother (age 44). None  of these relatives have had cancer.   Allison Reed mother is alive at age 75 without cancer. There are four maternal aunts and two maternal uncles. One maternal uncle died from skin cancer at age 34. There is no known cancer among  maternal cousins. Allison Reed maternal grandmother died in her 83s without cancer. Her maternal grandfather died in his 37s without cancer. A maternal great-aunt (MGM's sister) died in her 77s with a brain tumor.   Allison Reed father is alive at age 49 without cancer. There is one paternal aunt who has not had cancer, but did have a hysterectomy at age 72 (unknown if ovaries are intact). There is no known cancer among paternal paternal cousins. Allison Reed paternal grandmother died at age 46 without cancer. Her paternal grandfather died at age 85 without cancer.   Allison Reed is unaware of previous family history of genetic testing for hereditary cancer risks. Patient's maternal ancestors are of [place] descent, and paternal ancestors are of [place] descent. There is no reported Ashkenazi Jewish ancestry. There is no known consanguinity.  GENETIC TEST RESULTS: Genetic testing reported out on 07/01/2021 through the [facility] panel and CancerNext-Expanded + RNAinsight panel. No pathogenic variants were detected.   The BRCAplus panel offered by [employer] and includes sequencing and deletion/duplication analysis for the following 8 genes: ATM, BRCA1, BRCA2, CDH1, CHEK2, PALB2, PTEN, and TP53. The CancerNext-Expanded + RNAinsight gene panel offered by [employer] and includes sequencing and rearrangement analysis for the following 77 genes: AIP, ALK, APC, ATM, AXIN2, BAP1, BARD1, BLM, BMPR1A, BRCA1, BRCA2, BRIP1, CDC73, CDH1, CDK4, CDKN1B, CDKN2A, CHEK2, CTNNA1, DICER1, FANCC, FH, FLCN, GALNT12, KIF1B, LZTR1, MAX, MEN1, MET, MLH1, MSH2, MSH3, MSH6, MUTYH, NBN, NF1, NF2, NTHL1, PALB2, PHOX2B, PMS2, POT1, PRKAR1A, PTCH1, PTEN, RAD51C, RAD51D, RB1, RECQL, RET, SDHA, SDHAF2, SDHB, SDHC, SDHD, SMAD4, SMARCA4, SMARCB1, SMARCE1, STK11, SUFU, TMEM127, TP53, TSC1, TSC2, VHL and XRCC2 (sequencing and deletion/duplication); EGFR, EGLN1, HOXB13, KIT, MITF, PDGFRA, POLD1 and POLE (sequencing only); EPCAM and GREM1  (deletion/duplication only). RNA data is routinely analyzed for use in variant interpretation for all genes. The test report will be scanned into EPIC and located under the Molecular Pathology section of the Results Review tab.  A portion of the result report is included below for reference.     We discussed with Allison Reed that because current genetic testing is not perfect, it is possible there may be a gene mutation in one of these genes that current testing cannot detect, but that chance is small.  We also discussed that there could be another gene that has not yet been discovered, or that we have not yet tested, that is responsible for the cancer diagnoses in the family. It is also possible there is a hereditary cause for the cancer in the family that Allison Reed did not inherit and therefore was not identified in her testing. Therefore, it is important to remain in touch with cancer genetics in the future so that we can continue to offer Allison Reed the most up to date genetic testing.   ADDITIONAL GENETIC TESTING: We discussed with Allison Reed that her genetic testing was fairly extensive.  If there are genes identified to increase cancer risk that can be analyzed in the future, we would be happy to discuss and coordinate this testing at that time.    CANCER SCREENING RECOMMENDATIONS: Allison Reed test result is considered negative (normal).  This means that we have not identified a hereditary cause for her personal  and family history of cancer at this time. Most cancers happen by chance and this negative test suggests that her personal history of cancer may fall into this category.    While reassuring, this does not definitively rule out a hereditary predisposition to cancer. It is still possible that there could be genetic mutations that are undetectable by current technology. There could be genetic mutations in genes that have not been tested or identified to increase cancer risk.  Therefore, it is  recommended she continue to follow the cancer management and screening guidelines provided by her oncology and primary healthcare provider.   An individual's cancer risk and medical management are not determined by genetic test results alone. Overall cancer risk assessment incorporates additional factors, including personal medical history, family history, and any available genetic information that may result in a personalized plan for cancer prevention and surveillance.  RECOMMENDATIONS FOR FAMILY MEMBERS:  Individuals in this family might be at some increased risk of developing cancer, over the general population risk, simply due to the family history of cancer.  We recommended women in this family have a yearly mammogram beginning at age 73, or 68 years younger than the earliest onset of cancer, an annual clinical breast exam, and perform monthly breast self-exams. Women in this family should also have a gynecological exam as recommended by their primary provider. All family members should be referred for colonoscopy starting at age 65.  FOLLOW-UP: Lastly, we discussed with Allison Reed that cancer genetics is a rapidly advancing field and it is possible that new genetic tests will be appropriate for her and/or her family members in the future. We encouraged her to remain in contact with cancer genetics on an annual basis so we can update her personal and family histories and let her know of advances in cancer genetics that may benefit this family.   Our contact number was provided. Allison Reed questions were answered to her satisfaction, and she knows she is welcome to call us at anytime with additional questions or concerns.   Clint Guy, MS, [facility] Genetic Counselor [email or web address] Phone: [phone]

## 2021-07-03 NOTE — Telephone Encounter (Signed)
Revealed negative genetic testing. Discussed that we do not know why she has breast cancer or why there is cancer in the family. It is possible that there could be a mutation in a different gene that we are not testing, or our current technology may not be able to detect certain mutations. It will therefore be important for her to stay in contact with genetics to keep up with whether additional testing may be appropriate in the future.

## 2021-07-05 ENCOUNTER — Ambulatory Visit: Payer: 59 | Attending: Endocrinology

## 2021-07-05 ENCOUNTER — Telehealth: Payer: Self-pay | Admitting: Adult Health

## 2021-07-05 ENCOUNTER — Other Ambulatory Visit: Payer: Self-pay

## 2021-07-05 DIAGNOSIS — Z170 Estrogen receptor positive status [ER+]: Secondary | ICD-10-CM | POA: Insufficient documentation

## 2021-07-05 DIAGNOSIS — C50811 Malignant neoplasm of overlapping sites of right female breast: Secondary | ICD-10-CM | POA: Insufficient documentation

## 2021-07-05 DIAGNOSIS — R293 Abnormal posture: Secondary | ICD-10-CM | POA: Diagnosis not present

## 2021-07-05 NOTE — Patient Instructions (Signed)
Physical Therapy Information for After Breast Cancer Surgery/Treatment:  Lymphedema is a swelling condition that you may be at risk for in your arm if you have lymph nodes removed from the armpit area.  After a sentinel node biopsy, the risk is approximately 5-9% and is higher after an axillary node dissection.  There is treatment available for this condition and it is not life-threatening.  Contact your physician or physical therapist with concerns. You may begin the 4 shoulder/posture exercises (see additional sheet) when permitted by your physician (typically a week after surgery).  If you have drains, you may need to wait until those are removed before beginning range of motion exercises.  A general recommendation is to not lift your arms above shoulder height until drains are removed.  These exercises should be done to your tolerance and gently.  This is not a "no pain/no gain" type of recovery so listen to your body and stretch into the range of motion that you can tolerate, stopping if you have pain.  If you are having immediate reconstruction, ask your plastic surgeon about doing exercises as he or she may want you to wait. We encourage you to attend the free one time ABC (After Breast Cancer) class offered by [facility].  You will learn information related to lymphedema risk, prevention and treatment and additional exercises to regain mobility following surgery.  You can call [phone] for more information.  This is offered the 1st and 3rd Monday of each month.  You only attend the class one time. While undergoing any medical procedure or treatment, try to avoid blood pressure being taken or needle sticks from occurring on the arm on the side of cancer.   This recommendation begins after surgery and continues for the rest of your life.  This may help reduce your risk of getting lymphedema (swelling in your arm). An excellent resource for those seeking information on  lymphedema is the National Lymphedema Network's web site. It can be accessed at [facility] If you notice swelling in your hand, arm or breast at any time following surgery (even if it is many years from now), please contact your doctor or physical therapist to discuss this.  Lymphedema can be treated at any time but it is easier for you if it is treated early on.  If you feel like your shoulder motion is not returning to normal in a reasonable amount of time, please contact your surgeon or physical therapist.  [facility]. [place], [place], [place] [phone]; [address] [place], [place] [address] ABC CLASS After Breast Cancer Class  After Breast Cancer Class is a specially designed exercise class to assist you in a safe recover after having breast cancer surgery.  In this class you will learn how to get back to full function whether your drains were just removed or if you had surgery a month ago.  This one-time class is virtually the 1st and 3rd Monday of every month from 11:00 a.m. until 12:00 noon   This class is FREE and space is limited. For more information or to register for the next available class, call [phone].  Class Goals  Understand specific stretches to improve the flexibility of you chest and shoulder. Learn ways to safely strengthen your upper body and improve your posture. Understand the warning signs of infection and why you may be at risk for an arm infection. Learn about Lymphedema and prevention.  ** You do not attend this class until  after surgery.  Drains must be removed to participate  Patient was instructed today in a home exercise program today for post op shoulder range of motion. These included active assist shoulder flexion in sitting/supine, scapular retraction, wall walking with shoulder abduction, and hands behind head external rotation sitting and supine.  She was encouraged to do these twice a day, holding 3 seconds and repeating 5 times when permitted by  her physician.

## 2021-07-05 NOTE — Telephone Encounter (Signed)
I spoke with Dr. Tommi Rumps to review the PET scan request for insurance authorization.  I discussed that her imaging was inconclusive for metastatic disease to the clavicle, and the PET was ordered for further characterization.  He approved the PET scan.    Authorization #: [id number]  Authorization dates: 07/05/2021-08/19/2021  Time Spent: 15 minutes  Wilber Bihari, NP

## 2021-07-05 NOTE — Therapy (Signed)
[facility] [address] [place], [place], [address] Phone: [phone]   Fax:  [phone]  Physical Therapy Evaluation  Patient Details  Name: Allison Reed MRN: [id number] Date of Birth: 25-Apr-1976 Referring Provider (PT): Dr. Dwain Sarna   Encounter Date: 07/05/2021   PT End of Session - 07/05/21 0909     Visit Number 1    Number of Visits 2    Date for PT Re-Evaluation 08/30/21    PT Start Time 0801    PT Stop Time 0853    PT Time Calculation (min) 52 min    Activity Tolerance Patient tolerated treatment well    Behavior During Therapy [facility] for tasks assessed/performed             Past Medical History:  Diagnosis Date   Diabetes mellitus without complication (HCC)    Type 2   Family history of brain tumor    Family history of skin cancer    Gestational diabetes    Headache    otc med prn   Heartburn in pregnancy    Incompetent cervix in pregnancy 04/2015   Missed abortion    no surgery required   Postpartum care following cesarean delivery (11/2) 10/12/2015   right breast ca 06/2021   UTI in pregnancy 03/2015   recent UTI, treatment completed    Past Surgical History:  Procedure Laterality Date   APPENDECTOMY  2006   CERCLAGE LAPAROSCOPIC ABDOMINAL N/A 04/15/2015   Procedure: LAPAROSCOPIC TRANSABDOMINAL CERVCOISTHMIC CERCLAGE;  Surgeon: Fermin Schwab, MD;  Location: [facility];  Service: Gynecology;  Laterality: N/A;   CERVICAL CERCLAGE  02/2011   vaginal   CESAREAN SECTION N/A 10/12/2015   Procedure: CESAREAN SECTION;  Surgeon: Genia Del, MD;  Location: [facility];  Service: Obstetrics;  Laterality: N/A;   CESAREAN SECTION N/A 03/03/2018   Procedure: Repeat CESAREAN SECTION/Removal of Abdominal Cerclage;  Surgeon: Shea Evans, MD;  Location: [facility];  Service: Obstetrics;  Laterality: N/A;  EDD: 03/28/18   WISDOM TOOTH EXTRACTION  2001    There were no vitals filed for this  visit.    Subjective Assessment - 07/05/21 0808     Subjective Pre-surgical screen.  Has some pre-surgical left shoulder mild limitations and has had  massage for it and that has helped.  She is pending right breast mastectomy with SLNB tentatively on Jul 25, 2021. See below history    Pertinent History She is a wife of a pulmonary critical care physician . She noted a palpable right breast mass. This is followed by a mammogram with a mass with calcifications as really throughout the upper outer quadrant and in the central breast. This measures 9.4 x 8.7 x 6.7 cm. On ultrasound she had 3 separate masses at 10, 11, and 12:00. The sizes are 1.7, 2.1, and 2.2 cm. There is also a single abnormal axillary lymph node that has undergone biopsy. 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 She is pending right breast mastectomy with SLNB tentatively on August 16 and will also have reconstruction    Patient Stated Goals Pre-surgical screen, gain information fom provider.    Currently in Pain? No/denies  Multiple Pain Sites No                [facility] PT Assessment - 07/05/21 0001       Assessment   Medical Diagnosis Right breast Cancer    Referring Provider (PT) Dr. Donne Hazel    Hand Dominance Right    Prior Therapy yes      Precautions   Precaution Comments will be lymphedema risk      Restrictions   Weight Bearing Restrictions No      Balance Screen   Has the patient fallen in the past 6 months No    Has the patient had a decrease in activity level because of a fear of falling?  No    Is the patient reluctant to leave their home because of a fear of falling?  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    Leisure Dancer,runner      Cognition   Overall Cognitive Status Within Functional Limits for tasks assessed      Posture/Postural Control   Posture/Postural Control Postural limitations    Postural Limitations Rounded Shoulders      AROM   Right Shoulder Extension 50 Degrees    Right Shoulder Flexion 170 Degrees    Right Shoulder ABduction 180 Degrees    Right Shoulder Internal Rotation --   T5   Right Shoulder External Rotation 90 Degrees    Left Shoulder Extension 50 Degrees    Left Shoulder Flexion 158 Degrees    Left Shoulder ABduction 170 Degrees    Left Shoulder Internal Rotation --   T10   Left Shoulder External Rotation 80 Degrees               LYMPHEDEMA/ONCOLOGY QUESTIONNAIRE - 07/05/21 0001       Treatment   Active Chemotherapy Treatment No    Past Chemotherapy Treatment No    Active Radiation Treatment No    Past Radiation Treatment No    Current Hormone Treatment Yes    Drug Name Tamoxifen    Past Hormone Therapy No      What other symptoms do you have   Are you Having Heaviness or Tightness No    Are you having Pain No    Are you having pitting edema No    Is it Hard or Difficult finding clothes that fit No    Do you have infections No    Is there Decreased scar mobility No    Stemmer Sign No      Right Upper Extremity Lymphedema   10 cm Proximal to Olecranon Process 24.3 cm    Olecranon Process 24.4 cm    10 cm Proximal to Ulnar Styloid Process 20.5 cm    Just Proximal to Ulnar Styloid Process 14.95 cm    At Base of 2nd Digit 5.7 cm      Left Upper Extremity Lymphedema   10 cm Proximal to Olecranon Process 24.3 cm    Olecranon Process 24.3 cm     10 cm Proximal to Ulnar Styloid Process 19.9 cm    Just Proximal to Ulnar Styloid Process 15 cm    At [place] of 2nd  Digit 5.8 cm             L-DEX FLOWSHEETS - 07/05/21 1000       L-DEX LYMPHEDEMA SCREENING   Measurement Type Unilateral    L-DEX MEASUREMENT EXTREMITY Upper Extremity    POSITION  Standing    DOMINANT SIDE Right    At Risk Side Right    BASELINE SCORE (UNILATERAL) 0                  Quick Dash - 07/05/21 0001     Open a tight or new jar No difficulty    Do heavy household chores (wash walls, wash floors) No difficulty    Carry a shopping bag or briefcase No difficulty    Wash your back No difficulty    Use a knife to cut food No difficulty    Recreational activities in which you take some force or impact through your arm, shoulder, or hand (golf, hammering, tennis) No difficulty    During the past week, to what extent has your arm, shoulder or hand problem interfered with your normal social activities with family, friends, neighbors, or groups? Not at all    During the past week, to what extent has your arm, shoulder or hand problem limited your work or other regular daily activities Not at all    Arm, shoulder, or hand pain. None    Tingling (pins and needles) in your arm, shoulder, or hand None    Difficulty Sleeping No difficulty    DASH Score 0 %              Objective measurements completed on examination: See above findings.               PT Education - 07/05/21 0908     Education Details 4 post op exercises, ABC class, SOZO screens, skin care/lymhedema precautions    Person(s) Educated Patient    Methods Explanation;Handout    Comprehension Verbalized understanding                 PT Long Term Goals - 07/05/21 1018       PT LONG TERM GOAL #1   Title Pt will achieve full right shoulder ROM post surgery for full return to daily activities    Time 8    Period Weeks    Status New    Target Date 08/30/21              Breast Clinic Goals - 07/05/21 1017       Patient will be able to verbalize understanding of pertinent lymphedema risk reduction practices relevant to her diagnosis specifically related to skin care.   Time 1    Period Days    Status Achieved      Patient will be able to return demonstrate and/or verbalize understanding of the post-op home exercise program related to regaining shoulder range of motion.   Time 1    Period Weeks    Status Achieved      Patient will be able to verbalize understanding of the importance of attending the postoperative After Breast Cancer Class for further lymphedema risk reduction education and therapeutic exercise.   Time 1    Period Days    Status Achieved                   Plan - 07/05/21 1009     Clinical Impression Statement Pt is pending right breast mastectomy with SLNB with  immediate reconstruction with tentative date of August 16. She is also undergoing several scans at this time. She will undergo radiation.  She was educated in 4 post op exercises to begin as allowed by MD, we reviewed lymphedema skin care and precautions, and she was signed up for the ABC class on 08/28/21.  She had her SOZO screen today and this will need to be set up again 3 months after her surgery. She was advised to call with any questions or concerns    Personal Factors and Comorbidities Comorbidity 1    Comorbidities right breast cancer pending mastectomy and radiation    Stability/Clinical Decision Making Stable/Uncomplicated    Clinical Decision Making Low    Rehab Potential Excellent    PT Frequency 1x / week    PT Duration 8 weeks    PT Treatment/Interventions Therapeutic exercise;Patient/family education    PT Next Visit Plan Reassess post surgical, schedule next SOZO 3 months after surgery, remind about ABC class    PT Home Exercise Plan 4 post op exs when given MD approval    Consulted and Agree with Plan of Care Patient              Patient will benefit from skilled therapeutic intervention in order to improve the following deficits and impairments:  Postural dysfunction, Decreased knowledge of precautions  Visit Diagnosis: Abnormal posture  Malignant neoplasm of overlapping sites of right breast in female, estrogen receptor positive ([facility] [place] [facility])     Problem List Patient Active Problem List   Diagnosis Date Noted   Genetic testing 07/01/2021   Cancer ([facility]) 06/26/2021   Family history of skin cancer 06/22/2021   Family history of brain tumor 06/22/2021   Malignant neoplasm of overlapping sites of right breast in female, estrogen receptor positive ([facility]) 06/21/2021   Premature uterine contractions causing threatened premature labor in third trimester 03/03/2018   Previous cesarean delivery, delivered 03/03/2018   Postpartum care following cesarean delivery (3/25) 03/03/2018   Status post repeat low transverse cesarean section 03/03/2018   DM (diabetes mellitus) in pregnancy 03/02/2018   Incompetent cervix in pregnancy 03/02/2018   Premature uterine contractions in third trimester, antepartum 03/02/2018   Active labor 10/12/2015    Allison Reed 07/05/2021, 10:40 AM  [place], [place], [address] Phone: [phone]   Fax:  [phone]  Name: Allison Reed MRN: [id number] Date of Birth: [birth year] Cheral Almas, PT 07/05/21 10:41 AM

## 2021-07-06 ENCOUNTER — Encounter: Payer: Self-pay | Admitting: *Deleted

## 2021-07-06 ENCOUNTER — Other Ambulatory Visit: Payer: Self-pay | Admitting: General Surgery

## 2021-07-06 ENCOUNTER — Ambulatory Visit: Admission: RE | Admit: 2021-07-06 | Payer: 59 | Source: Ambulatory Visit

## 2021-07-06 ENCOUNTER — Ambulatory Visit
Admission: RE | Admit: 2021-07-06 | Discharge: 2021-07-06 | Disposition: A | Discharge status: Home / Self Care | Payer: 59 | Source: Ambulatory Visit | Attending: Hematology and Oncology | Admitting: Hematology and Oncology

## 2021-07-06 ENCOUNTER — Other Ambulatory Visit: Payer: Self-pay | Admitting: Hematology and Oncology

## 2021-07-06 ENCOUNTER — Telehealth: Payer: Self-pay | Admitting: Hematology and Oncology

## 2021-07-06 DIAGNOSIS — R928 Other abnormal and inconclusive findings on diagnostic imaging of breast: Secondary | ICD-10-CM

## 2021-07-06 DIAGNOSIS — Z170 Estrogen receptor positive status [ER+]: Secondary | ICD-10-CM

## 2021-07-06 DIAGNOSIS — C50811 Malignant neoplasm of overlapping sites of right female breast: Secondary | ICD-10-CM

## 2021-07-06 MED ORDER — GADOBUTROL 1 MMOL/ML IV SOLN
6.0000 mL | Freq: Once | INTRAVENOUS | Status: AC | PRN
  Administered 2021-07-06: 6 mL via INTRAVENOUS

## 2021-07-06 NOTE — Telephone Encounter (Signed)
Scheduled appt per 7/28 sch msg. Pt aware.  

## 2021-07-13 ENCOUNTER — Other Ambulatory Visit: Payer: Self-pay | Admitting: Hematology and Oncology

## 2021-07-13 ENCOUNTER — Ambulatory Visit (HOSPITAL_COMMUNITY)
Admission: RE | Admit: 2021-07-13 | Discharge: 2021-07-13 | Disposition: A | Discharge status: Home / Self Care | Payer: 59 | Source: Ambulatory Visit | Attending: Hematology and Oncology | Admitting: Hematology and Oncology

## 2021-07-13 ENCOUNTER — Telehealth: Payer: Self-pay | Admitting: *Deleted

## 2021-07-13 ENCOUNTER — Other Ambulatory Visit: Payer: Self-pay

## 2021-07-13 DIAGNOSIS — C50811 Malignant neoplasm of overlapping sites of right female breast: Secondary | ICD-10-CM | POA: Diagnosis present

## 2021-07-13 DIAGNOSIS — Z170 Estrogen receptor positive status [ER+]: Secondary | ICD-10-CM | POA: Diagnosis present

## 2021-07-13 LAB — GLUCOSE, CAPILLARY: Glucose-Capillary: 110 mg/dL — ABNORMAL HIGH (ref 70–99)

## 2021-07-13 MED ORDER — FLUDEOXYGLUCOSE F - 18 (FDG) INJECTION: 9.4000 | Freq: Once | INTRAVENOUS | Status: DC | PRN

## 2021-07-13 NOTE — Telephone Encounter (Signed)
Received Mammaprint Results of LOW RISK on Core Bx. Physician team notified. Called pt, Left vm with results. Contact information provided for questions.

## 2021-07-14 ENCOUNTER — Encounter: Payer: Self-pay | Admitting: *Deleted

## 2021-07-17 ENCOUNTER — Encounter (HOSPITAL_BASED_OUTPATIENT_CLINIC_OR_DEPARTMENT_OTHER): Payer: Self-pay | Admitting: General Surgery

## 2021-07-17 ENCOUNTER — Other Ambulatory Visit: Payer: Self-pay

## 2021-07-20 ENCOUNTER — Ambulatory Visit (HOSPITAL_COMMUNITY): Payer: 59

## 2021-07-21 ENCOUNTER — Encounter (HOSPITAL_BASED_OUTPATIENT_CLINIC_OR_DEPARTMENT_OTHER)
Admission: RE | Admit: 2021-07-21 | Discharge: 2021-07-21 | Disposition: A | Discharge status: Home / Self Care | Payer: 59 | Source: Ambulatory Visit | Attending: General Surgery | Admitting: General Surgery

## 2021-07-21 DIAGNOSIS — Z01810 Encounter for preprocedural cardiovascular examination: Secondary | ICD-10-CM | POA: Diagnosis present

## 2021-07-21 LAB — BASIC METABOLIC PANEL
Anion gap: 9 (ref 5–15)
BUN: 8 mg/dL (ref 6–20)
CO2: 23 mmol/L (ref 22–32)
Calcium: 9.2 mg/dL (ref 8.9–10.3)
Chloride: 104 mmol/L (ref 98–111)
Creatinine, Ser: 0.57 mg/dL (ref 0.44–1.00)
GFR, Estimated: >60 mL/min (ref >60)
Glucose, Bld: 123 mg/dL — ABNORMAL HIGH (ref 70–99)
Potassium: 4.1 mmol/L (ref 3.5–5.1)
Sodium: 136 mmol/L (ref 135–145)

## 2021-07-21 MED ORDER — ENSURE PRE-SURGERY PO LIQD: 296.0000 mL | Freq: Once | ORAL | Status: DC

## 2021-07-21 NOTE — Progress Notes (Signed)

## 2021-07-24 ENCOUNTER — Other Ambulatory Visit: Payer: Self-pay | Admitting: General Surgery

## 2021-07-24 ENCOUNTER — Ambulatory Visit
Admission: RE | Admit: 2021-07-24 | Discharge: 2021-07-24 | Disposition: A | Discharge status: Home / Self Care | Payer: 59 | Source: Ambulatory Visit | Attending: General Surgery | Admitting: General Surgery

## 2021-07-24 ENCOUNTER — Other Ambulatory Visit: Payer: Self-pay

## 2021-07-24 DIAGNOSIS — C50811 Malignant neoplasm of overlapping sites of right female breast: Secondary | ICD-10-CM

## 2021-07-24 DIAGNOSIS — Z170 Estrogen receptor positive status [ER+]: Secondary | ICD-10-CM

## 2021-07-25 ENCOUNTER — Observation Stay (HOSPITAL_BASED_OUTPATIENT_CLINIC_OR_DEPARTMENT_OTHER)
Admission: RE | Admit: 2021-07-25 | Discharge: 2021-07-26 | Disposition: A | Discharge status: Home / Self Care | Payer: 59 | Attending: General Surgery | Admitting: General Surgery

## 2021-07-25 ENCOUNTER — Ambulatory Visit (HOSPITAL_BASED_OUTPATIENT_CLINIC_OR_DEPARTMENT_OTHER): Payer: 59 | Admitting: Anesthesiology

## 2021-07-25 ENCOUNTER — Encounter (HOSPITAL_BASED_OUTPATIENT_CLINIC_OR_DEPARTMENT_OTHER): Payer: Self-pay | Admitting: General Surgery

## 2021-07-25 ENCOUNTER — Encounter (HOSPITAL_BASED_OUTPATIENT_CLINIC_OR_DEPARTMENT_OTHER)
Admission: RE | Disposition: A | Discharge status: Home / Self Care | Payer: Self-pay | Source: Home / Self Care | Attending: General Surgery

## 2021-07-25 ENCOUNTER — Ambulatory Visit (HOSPITAL_COMMUNITY)
Admission: RE | Admit: 2021-07-25 | Discharge: 2021-07-25 | Disposition: A | Discharge status: Home / Self Care | Payer: 59 | Source: Ambulatory Visit | Attending: General Surgery | Admitting: General Surgery

## 2021-07-25 ENCOUNTER — Ambulatory Visit
Admission: RE | Admit: 2021-07-25 | Discharge: 2021-07-25 | Disposition: A | Discharge status: Home / Self Care | Payer: 59 | Source: Ambulatory Visit | Attending: General Surgery | Admitting: General Surgery

## 2021-07-25 ENCOUNTER — Other Ambulatory Visit: Payer: Self-pay

## 2021-07-25 DIAGNOSIS — Z9011 Acquired absence of right breast and nipple: Secondary | ICD-10-CM

## 2021-07-25 DIAGNOSIS — E119 Type 2 diabetes mellitus without complications: Secondary | ICD-10-CM | POA: Insufficient documentation

## 2021-07-25 DIAGNOSIS — C773 Secondary and unspecified malignant neoplasm of axilla and upper limb lymph nodes: Secondary | ICD-10-CM | POA: Insufficient documentation

## 2021-07-25 DIAGNOSIS — C50911 Malignant neoplasm of unspecified site of right female breast: Principal | ICD-10-CM | POA: Insufficient documentation

## 2021-07-25 DIAGNOSIS — C50811 Malignant neoplasm of overlapping sites of right female breast: Secondary | ICD-10-CM

## 2021-07-25 DIAGNOSIS — Z7984 Long term (current) use of oral hypoglycemic drugs: Secondary | ICD-10-CM | POA: Insufficient documentation

## 2021-07-25 HISTORY — PX: BREAST RECONSTRUCTION WITH PLACEMENT OF TISSUE EXPANDER AND ALLODERM: SHX6805

## 2021-07-25 HISTORY — PX: MASTECTOMY W/ SENTINEL NODE BIOPSY: SHX2001

## 2021-07-25 HISTORY — PX: RADIOACTIVE SEED GUIDED AXILLARY SENTINEL LYMPH NODE: SHX6735

## 2021-07-25 LAB — GLUCOSE, CAPILLARY
Glucose-Capillary: 117 mg/dL — ABNORMAL HIGH (ref 70–99)
Glucose-Capillary: 152 mg/dL — ABNORMAL HIGH (ref 70–99)
Glucose-Capillary: 185 mg/dL — ABNORMAL HIGH (ref 70–99)

## 2021-07-25 LAB — POCT PREGNANCY, URINE: Preg Test, Ur: NEGATIVE

## 2021-07-25 SURGERY — MASTECTOMY WITH SENTINEL LYMPH NODE BIOPSY
Anesthesia: General | Site: Breast | Laterality: Right

## 2021-07-25 MED ORDER — ROPIVACAINE HCL 5 MG/ML IJ SOLN
INTRAMUSCULAR | Status: DC | PRN
  Administered 2021-07-25: 30 mL via PERINEURAL

## 2021-07-25 MED ORDER — MIDAZOLAM HCL 2 MG/2ML IJ SOLN
INTRAMUSCULAR | Status: AC
  Filled 2021-07-25: qty 2

## 2021-07-25 MED ORDER — ROCURONIUM BROMIDE 10 MG/ML (PF) SYRINGE
PREFILLED_SYRINGE | INTRAVENOUS | Status: AC
  Filled 2021-07-25: qty 10

## 2021-07-25 MED ORDER — ONDANSETRON HCL 4 MG/2ML IJ SOLN: 4.0000 mg | Freq: Four times a day (QID) | INTRAMUSCULAR | Status: DC | PRN

## 2021-07-25 MED ORDER — PROPOFOL 10 MG/ML IV BOLUS
INTRAVENOUS | Status: DC | PRN
  Administered 2021-07-25: 150 mg via INTRAVENOUS
  Administered 2021-07-25: 20 mg via INTRAVENOUS

## 2021-07-25 MED ORDER — LACTATED RINGERS IV SOLN: INTRAVENOUS | Status: DC

## 2021-07-25 MED ORDER — PROPOFOL 500 MG/50ML IV EMUL
INTRAVENOUS | Status: DC | PRN
  Administered 2021-07-25: 25 ug/kg/min via INTRAVENOUS

## 2021-07-25 MED ORDER — NON FORMULARY
Status: DC | PRN
  Administered 2021-07-25: 2 mL via INTRAMUSCULAR

## 2021-07-25 MED ORDER — LIDOCAINE HCL (CARDIAC) PF 100 MG/5ML IV SOSY
PREFILLED_SYRINGE | INTRAVENOUS | Status: DC | PRN
  Administered 2021-07-25: 60 mg via INTRAVENOUS

## 2021-07-25 MED ORDER — KETOROLAC TROMETHAMINE 15 MG/ML IJ SOLN
15.0000 mg | INTRAMUSCULAR | Status: AC
  Administered 2021-07-25: 15 mg via INTRAVENOUS

## 2021-07-25 MED ORDER — SODIUM CHLORIDE 0.9 % IV SOLN
INTRAVENOUS | Status: DC | PRN
  Administered 2021-07-25: 500 mL

## 2021-07-25 MED ORDER — CHLORHEXIDINE GLUCONATE CLOTH 2 % EX PADS: 6.0000 | MEDICATED_PAD | Freq: Once | CUTANEOUS | Status: DC

## 2021-07-25 MED ORDER — DEXAMETHASONE SODIUM PHOSPHATE 10 MG/ML IJ SOLN
INTRAMUSCULAR | Status: DC | PRN
  Administered 2021-07-25: 8 mg via INTRAVENOUS

## 2021-07-25 MED ORDER — FENTANYL CITRATE (PF) 100 MCG/2ML IJ SOLN
INTRAMUSCULAR | Status: AC
  Filled 2021-07-25: qty 2

## 2021-07-25 MED ORDER — KETOROLAC TROMETHAMINE 15 MG/ML IJ SOLN
INTRAMUSCULAR | Status: AC
  Filled 2021-07-25: qty 1

## 2021-07-25 MED ORDER — PHENYLEPHRINE HCL (PRESSORS) 10 MG/ML IV SOLN
INTRAVENOUS | Status: DC | PRN
  Administered 2021-07-25: 120 ug via INTRAVENOUS

## 2021-07-25 MED ORDER — ACETAMINOPHEN 500 MG PO TABS
ORAL_TABLET | ORAL | Status: AC
  Filled 2021-07-25: qty 2

## 2021-07-25 MED ORDER — PROMETHAZINE HCL 25 MG/ML IJ SOLN: 6.2500 mg | INTRAMUSCULAR | Status: DC | PRN

## 2021-07-25 MED ORDER — KETOROLAC TROMETHAMINE 15 MG/ML IJ SOLN
15.0000 mg | Freq: Four times a day (QID) | INTRAMUSCULAR | Status: DC | PRN
  Administered 2021-07-25: 15 mg via INTRAVENOUS
  Filled 2021-07-25: qty 1

## 2021-07-25 MED ORDER — ACETAMINOPHEN 500 MG PO TABS
1000.0000 mg | ORAL_TABLET | Freq: Four times a day (QID) | ORAL | Status: DC
  Administered 2021-07-25 – 2021-07-26 (×3): 1000 mg via ORAL
  Filled 2021-07-25 (×3): qty 2

## 2021-07-25 MED ORDER — POVIDONE-IODINE 10 % EX SOLN
CUTANEOUS | Status: DC | PRN
  Administered 2021-07-25: 1 via TOPICAL

## 2021-07-25 MED ORDER — SUGAMMADEX SODIUM 200 MG/2ML IV SOLN
INTRAVENOUS | Status: DC | PRN
  Administered 2021-07-25: 175 mg via INTRAVENOUS

## 2021-07-25 MED ORDER — ONDANSETRON HCL 4 MG/2ML IJ SOLN
INTRAMUSCULAR | Status: AC
  Filled 2021-07-25: qty 2

## 2021-07-25 MED ORDER — CEFAZOLIN SODIUM-DEXTROSE 2-4 GM/100ML-% IV SOLN
INTRAVENOUS | Status: AC
  Filled 2021-07-25: qty 100

## 2021-07-25 MED ORDER — MIDAZOLAM HCL 5 MG/5ML IJ SOLN
INTRAMUSCULAR | Status: DC | PRN
  Administered 2021-07-25 (×2): 1 mg via INTRAVENOUS

## 2021-07-25 MED ORDER — ONDANSETRON 4 MG PO TBDP: 4.0000 mg | ORAL_TABLET | Freq: Four times a day (QID) | ORAL | Status: DC | PRN

## 2021-07-25 MED ORDER — OXYCODONE HCL 5 MG PO TABS
5.0000 mg | ORAL_TABLET | ORAL | Status: DC | PRN
  Administered 2021-07-25 – 2021-07-26 (×2): 5 mg via ORAL
  Filled 2021-07-25 (×2): qty 1

## 2021-07-25 MED ORDER — OXYCODONE HCL 5 MG PO TABS: 5.0000 mg | ORAL_TABLET | Freq: Once | ORAL | Status: DC | PRN

## 2021-07-25 MED ORDER — AMISULPRIDE (ANTIEMETIC) 5 MG/2ML IV SOLN: 10.0000 mg | Freq: Once | INTRAVENOUS | Status: DC | PRN

## 2021-07-25 MED ORDER — DEXAMETHASONE SODIUM PHOSPHATE 10 MG/ML IJ SOLN
INTRAMUSCULAR | Status: AC
  Filled 2021-07-25: qty 1

## 2021-07-25 MED ORDER — METHOCARBAMOL 500 MG PO TABS
500.0000 mg | ORAL_TABLET | Freq: Four times a day (QID) | ORAL | Status: DC | PRN
  Administered 2021-07-25 – 2021-07-26 (×2): 500 mg via ORAL
  Filled 2021-07-25 (×2): qty 1

## 2021-07-25 MED ORDER — LIDOCAINE HCL (PF) 2 % IJ SOLN
INTRAMUSCULAR | Status: AC
  Filled 2021-07-25: qty 5

## 2021-07-25 MED ORDER — TECHNETIUM TC 99M SULFUR COLLOID FILTERED
1.0000 | Freq: Once | INTRAVENOUS | Status: AC | PRN
  Administered 2021-07-25: 1 via INTRADERMAL

## 2021-07-25 MED ORDER — PROPOFOL 500 MG/50ML IV EMUL
INTRAVENOUS | Status: AC
  Filled 2021-07-25: qty 50

## 2021-07-25 MED ORDER — MEPERIDINE HCL 25 MG/ML IJ SOLN: 6.2500 mg | INTRAMUSCULAR | Status: DC | PRN

## 2021-07-25 MED ORDER — ACETAMINOPHEN 500 MG PO TABS
1000.0000 mg | ORAL_TABLET | ORAL | Status: AC
Start: 2021-07-25 — End: 2021-07-25
  Administered 2021-07-25: 1000 mg via ORAL

## 2021-07-25 MED ORDER — MORPHINE SULFATE (PF) 4 MG/ML IV SOLN
1.0000 mg | INTRAVENOUS | Status: DC | PRN
Start: 2021-07-25 — End: 2021-07-26

## 2021-07-25 MED ORDER — HYDROMORPHONE HCL 1 MG/ML IJ SOLN
INTRAMUSCULAR | Status: AC
  Filled 2021-07-25: qty 0.5

## 2021-07-25 MED ORDER — CEFAZOLIN SODIUM-DEXTROSE 2-4 GM/100ML-% IV SOLN
2.0000 g | INTRAVENOUS | Status: AC
  Administered 2021-07-25: 2 g via INTRAVENOUS

## 2021-07-25 MED ORDER — PROPOFOL 10 MG/ML IV BOLUS
INTRAVENOUS | Status: AC
  Filled 2021-07-25: qty 20

## 2021-07-25 MED ORDER — OXYCODONE HCL 5 MG/5ML PO SOLN
5.0000 mg | Freq: Once | ORAL | Status: DC | PRN
Start: 2021-07-25 — End: 2021-07-25

## 2021-07-25 MED ORDER — SODIUM CHLORIDE 0.9 % IV SOLN: INTRAVENOUS | Status: DC

## 2021-07-25 MED ORDER — INSULIN ASPART 100 UNIT/ML IJ SOLN
0.0000 [IU] | Freq: Three times a day (TID) | INTRAMUSCULAR | Status: DC
  Administered 2021-07-25: 3 [IU] via SUBCUTANEOUS
  Filled 2021-07-25: qty 1

## 2021-07-25 MED ORDER — ROCURONIUM BROMIDE 100 MG/10ML IV SOLN
INTRAVENOUS | Status: DC | PRN
  Administered 2021-07-25: 60 mg via INTRAVENOUS
  Administered 2021-07-25: 15 mg via INTRAVENOUS
  Administered 2021-07-25: 10 mg via INTRAVENOUS
  Administered 2021-07-25: 15 mg via INTRAVENOUS

## 2021-07-25 MED ORDER — ONDANSETRON HCL 4 MG/2ML IJ SOLN
INTRAMUSCULAR | Status: DC | PRN
Start: 2021-07-25 — End: 2021-07-25
  Administered 2021-07-25: 4 mg via INTRAVENOUS

## 2021-07-25 MED ORDER — HYDROMORPHONE HCL 1 MG/ML IJ SOLN: 0.2500 mg | INTRAMUSCULAR | Status: DC | PRN

## 2021-07-25 MED ORDER — FENTANYL CITRATE (PF) 100 MCG/2ML IJ SOLN
INTRAMUSCULAR | Status: DC | PRN
  Administered 2021-07-25: 10 ug via INTRAVENOUS
  Administered 2021-07-25 (×4): 25 ug via INTRAVENOUS
  Administered 2021-07-25: 15 ug via INTRAVENOUS
  Administered 2021-07-25 (×3): 25 ug via INTRAVENOUS
  Administered 2021-07-25: 50 ug via INTRAVENOUS
  Administered 2021-07-25 (×2): 25 ug via INTRAVENOUS

## 2021-07-25 SURGICAL SUPPLY — 101 items
ALLOGRAFT PERF 16X20 1.6+/-0.4 (Tissue) ×2 IMPLANT
APPLIER CLIP 11 MED OPEN (CLIP)
APPLIER CLIP 9.375 MED OPEN (MISCELLANEOUS) ×2
BAG DECANTER FOR FLEXI CONT (MISCELLANEOUS) ×2 IMPLANT
BENZOIN TINCTURE PRP APPL 2/3 (GAUZE/BANDAGES/DRESSINGS) IMPLANT
BINDER BREAST LRG (GAUZE/BANDAGES/DRESSINGS) ×2 IMPLANT
BINDER BREAST MEDIUM (GAUZE/BANDAGES/DRESSINGS) IMPLANT
BINDER BREAST XLRG (GAUZE/BANDAGES/DRESSINGS) ×2 IMPLANT
BINDER BREAST XXLRG (GAUZE/BANDAGES/DRESSINGS) IMPLANT
BIOPATCH RED 1 DISK 7.0 (GAUZE/BANDAGES/DRESSINGS) IMPLANT
BLADE CLIPPER SURG (BLADE) IMPLANT
BLADE HEX COATED 2.75 (ELECTRODE) ×2 IMPLANT
BLADE SURG 10 STRL SS (BLADE) ×4 IMPLANT
BLADE SURG 15 STRL LF DISP TIS (BLADE) ×1 IMPLANT
BLADE SURG 15 STRL SS (BLADE) ×1
BNDG GAUZE ELAST 4 BULKY (GAUZE/BANDAGES/DRESSINGS) IMPLANT
CANISTER SUCT 1200ML W/VALVE (MISCELLANEOUS) ×2 IMPLANT
CHLORAPREP W/TINT 26 (MISCELLANEOUS) ×4 IMPLANT
CLIP APPLIE 11 MED OPEN (CLIP) IMPLANT
CLIP APPLIE 9.375 MED OPEN (MISCELLANEOUS) ×1 IMPLANT
CLIP TI WIDE RED SMALL 6 (CLIP) IMPLANT
COUNTER NEEDLE 1200 MAGNETIC (NEEDLE) IMPLANT
COVER BACK TABLE 60X90IN (DRAPES) ×2 IMPLANT
COVER MAYO STAND STRL (DRAPES) ×2 IMPLANT
COVER PROBE W GEL 5X96 (DRAPES) ×2 IMPLANT
DECANTER SPIKE VIAL GLASS SM (MISCELLANEOUS) IMPLANT
DERMABOND ADVANCED (GAUZE/BANDAGES/DRESSINGS) ×2
DERMABOND ADVANCED .7 DNX12 (GAUZE/BANDAGES/DRESSINGS) ×2 IMPLANT
DRAIN CHANNEL 15F RND FF W/TCR (WOUND CARE) ×2 IMPLANT
DRAIN CHANNEL 19F RND (DRAIN) ×2 IMPLANT
DRAPE INCISE IOBAN 66X45 STRL (DRAPES) ×2 IMPLANT
DRAPE TOP ARMCOVERS (MISCELLANEOUS) ×2 IMPLANT
DRAPE U-SHAPE 76X120 STRL (DRAPES) ×4 IMPLANT
DRAPE UTILITY XL STRL (DRAPES) ×2 IMPLANT
DRSG PAD ABDOMINAL 8X10 ST (GAUZE/BANDAGES/DRESSINGS) ×8 IMPLANT
DRSG TEGADERM 2-3/8X2-3/4 SM (GAUZE/BANDAGES/DRESSINGS) ×6 IMPLANT
DRSG TEGADERM 4X10 (GAUZE/BANDAGES/DRESSINGS) IMPLANT
DRSG TEGADERM 4X4.75 (GAUZE/BANDAGES/DRESSINGS) IMPLANT
ELECT BLADE 4.0 EZ CLEAN MEGAD (MISCELLANEOUS) ×2
ELECT COATED BLADE 2.86 ST (ELECTRODE) ×2 IMPLANT
ELECT REM PT RETURN 9FT ADLT (ELECTROSURGICAL) ×2
ELECTRODE BLDE 4.0 EZ CLN MEGD (MISCELLANEOUS) ×1 IMPLANT
ELECTRODE REM PT RTRN 9FT ADLT (ELECTROSURGICAL) ×1 IMPLANT
EVACUATOR SILICONE 100CC (DRAIN) ×4 IMPLANT
EXPANDER TISSUE FV FOURTE 400 (Prosthesis & Implant Plastic) ×1 IMPLANT
GAUZE SPONGE 4X4 12PLY STRL LF (GAUZE/BANDAGES/DRESSINGS) IMPLANT
GLOVE SURG ENC MOIS LTX SZ6.5 (GLOVE) IMPLANT
GLOVE SURG ENC MOIS LTX SZ7 (GLOVE) ×4 IMPLANT
GLOVE SURG HYDRASOFT LTX SZ5.5 (GLOVE) ×4 IMPLANT
GLOVE SURG POLYISO LF SZ8 (GLOVE) ×4 IMPLANT
GLOVE SURG UNDER POLY LF SZ6.5 (GLOVE) IMPLANT
GLOVE SURG UNDER POLY LF SZ7.5 (GLOVE) ×2 IMPLANT
GOWN STRL REUS W/ TWL LRG LVL3 (GOWN DISPOSABLE) ×4 IMPLANT
GOWN STRL REUS W/TWL LRG LVL3 (GOWN DISPOSABLE) ×4
HEMOSTAT ARISTA ABSORB 3G PWDR (HEMOSTASIS) ×2 IMPLANT
KIT FILL SYSTEM UNIVERSAL (SET/KITS/TRAYS/PACK) IMPLANT
LIGHT WAVEGUIDE WIDE FLAT (MISCELLANEOUS) ×2 IMPLANT
MARKER SKIN DUAL TIP RULER LAB (MISCELLANEOUS) IMPLANT
NDL SAFETY ECLIPSE 18X1.5 (NEEDLE) ×1 IMPLANT
NEEDLE HYPO 18GX1.5 SHARP (NEEDLE) ×1
NEEDLE HYPO 25X1 1.5 SAFETY (NEEDLE) IMPLANT
NS IRRIG 1000ML POUR BTL (IV SOLUTION) ×2 IMPLANT
PACK BASIN DAY SURGERY FS (CUSTOM PROCEDURE TRAY) ×2 IMPLANT
PENCIL SMOKE EVACUATOR (MISCELLANEOUS) ×2 IMPLANT
PIN SAFETY STERILE (MISCELLANEOUS) ×2 IMPLANT
PUNCH BIOPSY DERMAL 4MM (MISCELLANEOUS) ×2 IMPLANT
RETRACTOR ONETRAX LX 90X20 (MISCELLANEOUS) IMPLANT
SHEET MEDIUM DRAPE 40X70 STRL (DRAPES) ×6 IMPLANT
SLEEVE SCD COMPRESS KNEE MED (STOCKING) ×2 IMPLANT
SPONGE T-LAP 18X18 ~~LOC~~+RFID (SPONGE) ×6 IMPLANT
SPONGE T-LAP 4X18 ~~LOC~~+RFID (SPONGE) IMPLANT
STAPLER VISISTAT 35W (STAPLE) ×2 IMPLANT
STRIP CLOSURE SKIN 1/2X4 (GAUZE/BANDAGES/DRESSINGS) IMPLANT
SUT CHROMIC 4 0 PS 2 18 (SUTURE) ×8 IMPLANT
SUT ETHILON 2 0 FS 18 (SUTURE) ×2 IMPLANT
SUT MNCRL AB 4-0 PS2 18 (SUTURE) ×8 IMPLANT
SUT PDS AB 2-0 CT2 27 (SUTURE) IMPLANT
SUT SILK 2 0 SH (SUTURE) ×2 IMPLANT
SUT VIC AB 2-0 SH 27 (SUTURE) ×1
SUT VIC AB 2-0 SH 27XBRD (SUTURE) ×1 IMPLANT
SUT VIC AB 3-0 54X BRD REEL (SUTURE) ×4 IMPLANT
SUT VIC AB 3-0 BRD 54 (SUTURE) ×4
SUT VIC AB 3-0 SH 27 (SUTURE)
SUT VIC AB 3-0 SH 27X BRD (SUTURE) IMPLANT
SUT VICRYL 0 CT-2 (SUTURE) ×4 IMPLANT
SUT VICRYL 3-0 CR8 SH (SUTURE) IMPLANT
SUT VICRYL 4-0 PS2 18IN ABS (SUTURE) IMPLANT
SUT VLOC 180 0 24IN GS25 (SUTURE) ×2 IMPLANT
SYR 10ML LL (SYRINGE) IMPLANT
SYR 50ML LL SCALE MARK (SYRINGE) ×2 IMPLANT
SYR BULB IRRIG 60ML STRL (SYRINGE) ×2 IMPLANT
SYR CONTROL 10ML LL (SYRINGE) IMPLANT
TAPE MEASURE VINYL STERILE (MISCELLANEOUS) IMPLANT
TISSUE EXPNDR FV FOURTE 400 (Prosthesis & Implant Plastic) ×2 IMPLANT
TOWEL GREEN STERILE FF (TOWEL DISPOSABLE) ×4 IMPLANT
TRACER MAGTRACE VIAL (MISCELLANEOUS) ×2 IMPLANT
TRAY DSU PREP LF (CUSTOM PROCEDURE TRAY) ×2 IMPLANT
TRAY FOLEY W/BAG SLVR 14FR LF (SET/KITS/TRAYS/PACK) IMPLANT
TUBE CONNECTING 20X1/4 (TUBING) ×2 IMPLANT
UNDERPAD 30X36 HEAVY ABSORB (UNDERPADS AND DIAPERS) ×4 IMPLANT
YANKAUER SUCT BULB TIP NO VENT (SUCTIONS) ×2 IMPLANT

## 2021-07-25 NOTE — H&P (View-Only) (Signed)
45 y.o. female who is seen today as an office consultation at the request of Dr. Lindi Adie for evaluation of newly diagnosed breast cancer.   She is a wife of a pulmonary critical care physician that I know. She noted a palpable right breast mass. This is followed by a mammogram with a mass with calcifications as really throughout the upper outer quadrant and in the central breast. This measures 9.4 x 8.7 x 6.7 cm. On ultrasound she had 3 separate masses at 10, 11, and 12:00. The sizes are 1.7, 2.1, and 2.2 cm. There is also a single abnormal axillary lymph node that has undergone biopsy. She also has undergone an MRI that shows extensive right breast disease with both mass and non-mass enhancement involving the majority of the superior and upper outer right breast with relative sparing of the lower inner quadrant. There is no definite involvement of the skin or nipple areolar complex. There is a single abnormal axillary node. There are indeterminate 8 and 9 mm enhancing masses in the outer left breast as well. Biopsy of the right breast lesions or a grade 2 invasive ductal carcinoma that is very ER, PR positive, HER2 negative, and Ki-67 is 25%. The node is also positive. The left breast mass was biopsied yesterday and that pathology is still pending right now.  CT c/a/p shows sclerotic lesion in the medial left clavicle, right ax node and left subpectoral node. There is ovarian cyst also. Bone scan is negative. She has pet scan pending due to the lesion in the clavicle.   Review of Systems: A complete review of systems was obtained from the patient. I have reviewed this information and discussed as appropriate with the patient. See HPI as well for other ROS.  Review of Systems  All other systems reviewed and are negative.   Medical History: Past Medical History:  Diagnosis Date   Diabetes mellitus type 2, uncomplicated (CMS-HCC)   Migraine   Patient Active Problem List  Diagnosis   Pre-existing  type 2 diabetes mellitus during pregnancy in second trimester   Pregnancy resulting from in vitro fertilization in second trimester   Abnormal fetal echocardiogram affecting antepartum care of mother   Malignant neoplasm of overlapping sites of right breast in female, estrogen receptor positive (CMS-HCC)   Past Surgical History:  Procedure Laterality Date   APPENDECTOMY 08/2005   CERVICAL CERCLAGE ABDOMINAL 2016   CESAREAN SECTION 10/12/2015    Allergies  Allergen Reactions   Adhesive Tape-Silicones Rash   Current Outpatient Medications on File Prior to Visit  Medication Sig Dispense Refill   LEVEMIR FLEXTOUCH U-100 INSULN pen injector (concentration 100 units/mL) INJECT 5 UNITS SUBCUTANEOUSLY ONCE DAILY AND INCREASE AS DIRECTED 6   metFORMIN (GLUCOPHAGE) 1000 MG tablet TAKE 1 TABLET BY MOUTH EVERY MORNING, 1/2 TABLET MID DAY AND 1 TABLET BY MOUTH EVERY EVENING 3   prenatal vit no.124-iron-FA 27 mg iron- 800 mcg Tab Take by mouth.   No current facility-administered medications on file prior to visit.   Family History  Problem Relation Age of Onset   Diabetes Father    Social History   Tobacco Use  Smoking Status Never Smoker  Smokeless Tobacco Never Used    Social History   Socioeconomic History   Marital status: Married  Tobacco Use   Smoking status: Never Smoker   Smokeless tobacco: Never Used   Objective:   There were no vitals filed for this visit.  There is no height or weight on file to  calculate BMI.  Physical Exam Constitutional:  Appearance: Normal appearance.  Chest:  Breasts:  Right: Mass present. No inverted nipple or nipple discharge.  Left: No inverted nipple, mass or nipple discharge.   Lymphadenopathy:  Upper Body:  Right upper body: No supraclavicular or axillary adenopathy.  Left upper body: No supraclavicular or axillary adenopathy.  Neurological:  Mental Status: She is alert.     Assessment and Plan:  Right breast cancer Right  SSM, TAD  We discussed the staging and pathophysiology of breast cancer. We discussed all of the different options for treatment for breast cancer including surgery, chemotherapy, radiation therapy, Herceptin, and antiestrogen therapy.  We discussed a targeted lymph node dissection for her nodes. She only has 1 abnormal node. I think a seed guided excision of the positive node as well as a sentinel node biopsy would be the best plan. We discussed waiting for the final pathology and there is a small chance I might recommend returning for a completion axillary lymph node dissection. We discussed a 5 to 10% risk of chronic shoulder pain, neuropathic pain, as well as lymphedema. She is going to get so-so preop that we will follow her postoperatively as well.  We also discussed the excision of the breast cancer. I do not think she is a candidate for a lumpectomy. I think with the volume of the disease and reviewing her imaging I also think a nipple sparing mastectomy is not the best oncologic plan. We discussed a skin sparing mastectomy. Mastectomy can be followed by reconstruction although she will get radiation. She is due to see plastic surgery later today. We discussed that mastectomy is not 100% preventive and there is a small chance she could have a recurrence later as well.  We discussed the risks of operation as well as recovery and she is going to see plastic surgeon later today to discuss this.  Prior to surgery we also need to wait on her genetics and I discussed that if her genetics were positive the surgical plan might change. With negative genetics and a negative left-sided biopsy I do not think there is any reason for a left mastectomy. She also has pathology pending from an ultrasound-guided biopsy which sounds mostly like a cyst and will need a left MR biopsy which is being set up.  There is a concern for stage IV disease on the clavicle. She also has a left subpectoral node. She does tell me  today that she did have some trauma to this left side and that may be the reason for this. She has a PET scan pending from oncology but I think with the overall picture treating her with a curative intent is the best plan.

## 2021-07-25 NOTE — Transfer of Care (Signed)
Immediate Anesthesia Transfer of Care Note  Patient: Allison Reed  Procedure(s) Performed: RIGHT MASTECTOMY WITH AXILLARY SENTINEL LYMPH NODE BIOPSY (Right: Breast) RADIOACTIVE SEED GUIDED RIGHT AXILLARY NODE EXCISION (Right: Breast) RIGHT BREAST RECONSTRUCTION WITH PLACEMENT OF TISSUE EXPANDER AND ALLODERM (Right: Breast)  Patient Location: PACU  Anesthesia Type:General  Level of Consciousness: awake, oriented and patient cooperative  Airway & Oxygen Therapy: Patient Spontanous Breathing and Patient connected to face mask oxygen  Post-op Assessment: Report given to RN and Post -op Vital signs reviewed and stable  Post vital signs: Reviewed and stable  Last Vitals:  Vitals Value Taken Time  BP    Temp    Pulse 93 07/25/21 1331  Resp 16 07/25/21 1331  SpO2 99 % 07/25/21 1331  Vitals shown include unvalidated device data.  Last Pain:  Vitals:   07/25/21 0841  TempSrc: Oral  PainSc: 0-No pain         Complications: No notable events documented.

## 2021-07-25 NOTE — H&P (Signed)
Subjective:     Patient ID: Allison Reed is a 45 y.o. female.   HPI   Patient of Drs. Rande Lawman here for breast reconstruction. Presented with palpable right breast mass. Diagnostic MMG showed a piculated mass in the palpable area right UOQ with microcalcifications throughout the central and UOQ, area of involvement measures 9.4 x 6.2 x 8.7 cm. [place] noted lesion right breast 10 o'clock 8 cmfn palpable area 2.7 x 1.7 x 1.1 cm, a 11 o clock lesion 6cmfn 1.6 x 0.8 x 2.1 cm, and a 12 o'clock lesion 8 cmfn 1.9 x 0.4 x 2.2 cm. Single abnormal right axillary LN noted. Biopsies labeled 10 o clock, 11 o clock, and 12 o clock each with IDC with DCIS, ER/PR+, Her2-. LN + for IDC.    MRI demonstrated extensive disease with both mass and NME involving the majority of the superior and upper outer right breast. No definite involvement of the skin, nipple-areolar complex or pectoralis muscle. Single abnormal right axillary LN present. Indeterminate 0.8 and 0.9 cm masses in the outer left breast. Left breast US guided biopsy 2 o clock benign. At time of scheduled MR guided biopsy of second mass, no enhancement noted.   Bone scan negative. CT CAP showed sclerotic lesion of the medial left clavicle, concern for metastatic disease.  A right axillary LN measures 0.9 cm in short axis and a left subpectoral LN measures 0.8 cm in short axis present.   PET scan negative.   Mastectomy recommended. Dr. Donne Hazel recommended against NSM. Mammaprint low risk. She will receive adjuvant radiation. Tamoxifen started as work up completed.   Genetics negative.   Current C cup happy with this. Wt down 10 lb over last 10 years.   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 Lives with spouse and kids ages 3,5.   Review of Systems Remainder 12 point review negative    Objective:   Physical Exam Cardiovascular:     Rate and  Rhythm: Normal rate and regular rhythm.     Heart sounds: Normal heart sounds.  Pulmonary:     Effort: Pulmonary effort is normal.     Breath sounds: Normal breath sounds.  Neurological:     Mental Status: She is oriented to person, place, and time.     Breasts: Grade 3 ptosis bilateral SN to nipple R 27 L 27 cm BW R 20 L 20 cm Nipple to IMF R 9 L 9cm      Assessment:     Right breast ca overlapping sites ER+    Plan:     Hold tamoxifen week prior to surgery.   Plan right skin reduction pattern mastectomy with immediate tissue expander acellular dermis reconstruction. Reviewed anchor type scar.   Discussed use of acellular dermis in reconstruction, cadaveric source, incorporation over several weeks, risk that if has seroma or infection can act as additional nidus for infection if not incorporated. Reviewed this is an off label use of acellular dermis.    Reviewed prepectoral vs sub pectoral reconstruction. Discussed with patient and benefit of this is no animation deformity, may be less pain. Risk may be more visible rippling over upper poles, greater need of ADM. Reviewed pre pectoral would require larger amount acellular dermis, more drains. Discussed any type reconstruction also risks long term displacement implant and visible rippling. If prepectoral counseled I would recommend she be comfortable with silicone implants  as more options that have less rippling. She agrees to prepectoral placement.   Additional risks including but not limited to bleeding, seroma, hematoma, damage to adjacent structures, need for additional procedures, blood clots in legs or lungs reviewed.   Rx for [place] [facility] [place] given. Drain teaching completed. Rx for tramadol, robaxin and Bactrim given.

## 2021-07-25 NOTE — Anesthesia Postprocedure Evaluation (Signed)
Anesthesia Post Note  Patient: Allison Reed  Procedure(s) Performed: RIGHT MASTECTOMY WITH AXILLARY SENTINEL LYMPH NODE BIOPSY (Right: Breast) RADIOACTIVE SEED GUIDED RIGHT AXILLARY NODE EXCISION (Right: Breast) RIGHT BREAST RECONSTRUCTION WITH PLACEMENT OF TISSUE EXPANDER AND ALLODERM (Right: Breast)     Patient location during evaluation: PACU Anesthesia Type: General Level of consciousness: awake and alert Pain management: pain level controlled Vital Signs Assessment: post-procedure vital signs reviewed and stable Respiratory status: spontaneous breathing, nonlabored ventilation and respiratory function stable Cardiovascular status: blood pressure returned to baseline and stable Postop Assessment: no apparent nausea or vomiting Anesthetic complications: no   No notable events documented.  Last Vitals:  Vitals:   07/25/21 1430 07/25/21 1445  BP: 118/84 119/81  Pulse: 92 96  Resp: 13 16  Temp:    SpO2: 97% 95%    Last Pain:  Vitals:   07/25/21 1400  TempSrc:   PainSc: Asleep                 Lynda Rainwater

## 2021-07-25 NOTE — Anesthesia Procedure Notes (Signed)
Anesthesia Regional Block: Pectoralis block   Pre-Anesthetic Checklist: , timeout performed,  Correct Patient, Correct Site, Correct Laterality,  Correct Procedure, Correct Position, site marked,  Risks and benefits discussed,  Surgical consent,  Pre-op evaluation,  At surgeon's request and post-op pain management  Laterality: Right  Prep: chloraprep       Needles:  Injection technique: Single-shot  Needle Type: Stimiplex     Needle Length: 9cm  Needle Gauge: 21     Additional Needles:   Procedures:,,,, ultrasound used (permanent image in chart),,    Narrative:  Start time: 07/25/2021 10:29 AM End time: 07/25/2021 10:34 AM Injection made incrementally with aspirations every 5 mL.  Performed by: Personally  Anesthesiologist: Lynda Rainwater, MD

## 2021-07-25 NOTE — Op Note (Signed)
Preoperative diagnosis: Clinical stage II right breast cancer Postoperative diagnosis: Same as above Procedure: 1.  Right skin sparing mastectomy 2.  Right axillary node radioactive seed guided excision 3.  Right axillary sentinel lymph node biopsy Surgeon: Dr. Serita Grammes Anesthesia: General with a pectoral block Specimens: 1.  Right breast tissue marked short superior, long lateral 2.  Radioactive seed containing right axillary lymph node that also was a sentinel node 3.  Additional right axillary sentinel lymph nodes Complications: None Drains: Per plastic surgery Case turned over to plastic surgery at completion  Indications:45 y.o. female who noted a palpable right breast mass. This is followed by a mammogram with a mass with calcifications as really throughout the upper outer quadrant and in the central breast. This measures 9.4 x 8.7 x 6.7 cm. On ultrasound she had 3 separate masses at 10, 11, and 12:00. The sizes are 1.7, 2.1, and 2.2 cm. There is also a single abnormal axillary lymph node that has undergone biopsy. She also has undergone an MRI that shows extensive right breast disease with both mass and non-mass enhancement involving the majority of the superior and upper outer right breast with relative sparing of the lower inner quadrant. There is no definite involvement of the skin or nipple areolar complex. There is a single abnormal axillary node. There are indeterminate 8 and 9 mm enhancing masses in the outer left breast as well. Biopsy of the right breast lesions or a grade 2 invasive ductal carcinoma that is very ER, PR positive, HER2 negative, and Ki-67 is 25%. The node is also positive.  She has had a metastatic work-up and this is thought to be negative.  We discussed options and elected to proceed with mastectomy and targeted lymph node dissection.  Procedure: After informed consent was obtained the patient was taken to the operating room.  She had Lymphoseek injected in  the preoperative area.  She was given antibiotics.  SCDs were in place.  She was placed under general anesthesia without complication.  I then injected 2 cc of mag trace in the subareolar position and massaged this for 5 minutes.  She had not want a pectoral block in the operating room.  She was then prepped and draped in the standard sterile surgical fashion.  Surgical timeout was then performed.  I noted the radioactive seed in the axilla.  I then made a incision below the axillary hairline.  I carried this through the axillary fascia.  I then was able to identify the radioactive seed containing node and remove this.  Both with Lymphoseek and mag trace this was also a sentinel lymph node.  I then removed several other sentinel lymph nodes as well.  The background count was less than 10%.  There were no other palpable nodes present.  I then obtained hemostasis.  I later closed this with 2-0 Vicryl, 3-0 Vicryl, and 4-0 Monocryl.  I then made an incision to encompass the nipple areolar complex and discussion with plastic surgery.  I then created flaps to the parasternal area, inframammary fold, clavicle, and latissimus laterally.  I remove the breast tissue as well as the pectoralis fascia passed this off the table as a specimen.  The flaps were viable.  There was no additional breast tissue to remove.  I then packed this and turned the case over to plastic surgery for reconstruction.

## 2021-07-25 NOTE — Anesthesia Procedure Notes (Signed)
Procedure Name: Intubation Date/Time: 07/25/2021 10:11 AM Performed by: Garrel Ridgel, CRNA Pre-anesthesia Checklist: Patient identified, Emergency Drugs available, Suction available and Patient being monitored Patient Re-evaluated:Patient Re-evaluated prior to induction Oxygen Delivery Method: Circle system utilized Preoxygenation: Pre-oxygenation with 100% oxygen Induction Type: IV induction Ventilation: Mask ventilation without difficulty Laryngoscope Size: Mac Grade View: Grade II Tube type: Oral Tube size: 7.0 mm Number of attempts: 1 Airway Equipment and Method: Stylet and Oral airway Placement Confirmation: ETT inserted through vocal cords under direct vision, positive ETCO2 and breath sounds checked- equal and bilateral Secured at: 22 cm Tube secured with: Tape Dental Injury: Teeth and Oropharynx as per pre-operative assessment

## 2021-07-25 NOTE — Anesthesia Preprocedure Evaluation (Signed)
Anesthesia Evaluation  Patient identified by MRN, date of birth, ID band Patient awake    Reviewed: Allergy & Precautions, NPO status , Patient's Chart, lab work & pertinent test results  History of Anesthesia Complications Negative for: history of anesthetic complications  Airway Mallampati: III  TM Distance: >3 FB Neck ROM: Full    Dental  (+) Teeth Intact, Dental Advisory Given   Pulmonary neg pulmonary ROS,    Pulmonary exam normal breath sounds clear to auscultation       Cardiovascular Exercise Tolerance: Good negative cardio ROS Normal cardiovascular exam Rhythm:Regular Rate:Normal     Neuro/Psych  Headaches, negative neurological ROS  negative psych ROS   GI/Hepatic negative GI ROS, Neg liver ROS,   Endo/Other  negative endocrine ROSdiabetes, Well Controlled, Type 2  Renal/GU negative Renal ROS  negative genitourinary   Musculoskeletal negative musculoskeletal ROS (+)   Abdominal   Peds negative pediatric ROS (+)  Hematology negative hematology ROS (+)   Anesthesia Other Findings Breast Cancer  Day of surgery medications reviewed with the patient.  Reproductive/Obstetrics negative OB ROS                             Anesthesia Physical  Anesthesia Plan  ASA: 3  Anesthesia Plan: General   Post-op Pain Management:  Regional for Post-op pain   Induction: Intravenous  PONV Risk Score and Plan: 3 and Ondansetron, Dexamethasone, Midazolam and Treatment may vary due to age or medical condition  Airway Management Planned: Oral ETT  Additional Equipment:   Intra-op Plan:   Post-operative Plan: Extubation in OR  Informed Consent: I have reviewed the patients History and Physical, chart, labs and discussed the procedure including the risks, benefits and alternatives for the proposed anesthesia with the patient or authorized representative who has indicated his/her  understanding and acceptance.     Dental advisory given  Plan Discussed with: CRNA  Anesthesia Plan Comments:         Anesthesia Quick Evaluation

## 2021-07-25 NOTE — Op Note (Signed)
Operative Note   DATE OF OPERATION: 8.16.22  LOCATION: [facility]-observation  SURGICAL DIVISION: Plastic Surgery  PREOPERATIVE DIAGNOSES:  1. Right breast cancer overlapping sites ER+  POSTOPERATIVE DIAGNOSES:  same  PROCEDURE:  1. Right breast reconstruction with tissue expander 2. Acellular dermis (Alloderm) to right chest 300 cm2  SURGEON: Irene Limbo MD MBA  ASSISTANT: none  ANESTHESIA:  General.   EBL: 200 ml for entire procedure  COMPLICATIONS: None immediate.   INDICATIONS FOR PROCEDURE:  The patient, Allison Reed, is a 45 y.o. female born on 11/04/1976, is here for immediate prepectoral expander based reconstruction following mastectomy.   FINDINGS: Natrelle 133S FV-12 T 400 ml tissue expander placed, initial fill volume 225 ml air. SN [id number]  DESCRIPTION OF PROCEDURE:   The patient was marked with the patient in the preoperative area to mark sternal notch, chest midline, anterior axillary lines and inframammary folds. Patient was marked for skin reduction mastectomy with most superior portion nipple areola marked on breast meridian. Vertical limbs marked at lateral border areolae and set at 9 cm length. The patient was taken to the operating room. SCDs were placed and IV antibiotics were given. The patient's operative site was prepped and draped in a sterile fashion. A time out was performed and all information was confirmed to be correct. I assisted in mastectomy in retraction and exposure. Following completion of mastectomy, the lateral limbs for resection marked and area over lower pole preserved as inferiorly based dermal pedicle. Skin de epithelialized in this area.     The cavity was irrigated with saline solution containing Ancef, gentamicin, and betadine. Hemostasis was ensured. A 19 Fr drain was placed in subcutaneous position laterally and a 15 Fr drain placed along inframammary fold. Each secured to skin with 2-0 nylon. The tissue expander was  prepared on back table prior in insertion. The expander was filled with air. Perforated acellular dermis was draped over anterior surface expander. The ADM was then secured to itself over posterior surface of expander with 4-0 chromic. Redundant folds acellular dermis excised so that the ADM lay flat without folds over air filled expander. The expander was secured to medial insertion pectoralis with a 0 vicryl. The superior and lateral tabs also secured to pectoralis muscle with 0-vicryl. The ADM was secured to pectoralis muscle and chest wall along inferior border at inframammary fold with 0 v lock suture. Laterally the mastectomy flap over posterior axillary line was advanced anteriorly and the subcutaneous tissue and superficial fascia was secured to chest wall with 0-vicryl. The inferiorly based dermal pedicle was redraped superiorly over expander and acellular dermis and secured to pectoralis with interrupted 0-vicryl. Skin closure completed with 3-0 vicryl in fascial layer and 4-0 vicryl in dermis. Skin closure completed with 4-0 monocryl subcuticular and tissue adhesive. Tegaderms applied bilateral, followed by dry dressing and breast binder.  The patient was allowed to wake from anesthesia, extubated and taken to the recovery room in satisfactory condition.   SPECIMENS: none  DRAINS: 15 and 19 Fr JP in right breast reconstruction

## 2021-07-25 NOTE — Interval H&P Note (Signed)
History and Physical Interval Note:  07/25/2021 9:31 AM  Allison Reed  has presented today for surgery, with the diagnosis of RIGHT BREAST CANCER.  The various methods of treatment have been discussed with the patient and family. After consideration of risks, benefits and other options for treatment, the patient has consented to  Procedure(s): RIGHT MASTECTOMY WITH AXILLARY SENTINEL LYMPH NODE BIOPSY (Right) RADIOACTIVE SEED GUIDED RIGHT AXILLARY NODE EXCISION (Right) RIGHT BREAST RECONSTRUCTION WITH PLACEMENT OF TISSUE EXPANDER AND ALLODERM (Right) as a surgical intervention.  The patient's history has been reviewed, patient examined, no change in status, stable for surgery.  I have reviewed the patient's chart and labs.  Questions were answered to the patient's satisfaction.     [name]

## 2021-07-25 NOTE — H&P (Signed)
45 y.o. female who is seen today as an office consultation at the request of Dr. Lindi Adie for evaluation of newly diagnosed breast cancer.   She is a wife of a pulmonary critical care physician that I know. She noted a palpable right breast mass. This is followed by a mammogram with a mass with calcifications as really throughout the upper outer quadrant and in the central breast. This measures 9.4 x 8.7 x 6.7 cm. On ultrasound she had 3 separate masses at 10, 11, and 12:00. The sizes are 1.7, 2.1, and 2.2 cm. There is also a single abnormal axillary lymph node that has undergone biopsy. She also has undergone an MRI that shows extensive right breast disease with both mass and non-mass enhancement involving the majority of the superior and upper outer right breast with relative sparing of the lower inner quadrant. There is no definite involvement of the skin or nipple areolar complex. There is a single abnormal axillary node. There are indeterminate 8 and 9 mm enhancing masses in the outer left breast as well. Biopsy of the right breast lesions or a grade 2 invasive ductal carcinoma that is very ER, PR positive, HER2 negative, and Ki-67 is 25%. The node is also positive. The left breast mass was biopsied yesterday and that pathology is still pending right now.  CT c/a/p shows sclerotic lesion in the medial left clavicle, right ax node and left subpectoral node. There is ovarian cyst also. Bone scan is negative. She has pet scan pending due to the lesion in the clavicle.   Review of Systems: A complete review of systems was obtained from the patient. I have reviewed this information and discussed as appropriate with the patient. See HPI as well for other ROS.  Review of Systems  All other systems reviewed and are negative.   Medical History: Past Medical History:  Diagnosis Date   Diabetes mellitus type 2, uncomplicated (CMS-HCC)   Migraine   Patient Active Problem List  Diagnosis   Pre-existing  type 2 diabetes mellitus during pregnancy in second trimester   Pregnancy resulting from in vitro fertilization in second trimester   Abnormal fetal echocardiogram affecting antepartum care of mother   Malignant neoplasm of overlapping sites of right breast in female, estrogen receptor positive (CMS-HCC)   Past Surgical History:  Procedure Laterality Date   APPENDECTOMY 08/2005   CERVICAL CERCLAGE ABDOMINAL 2016   CESAREAN SECTION 10/12/2015    Allergies  Allergen Reactions   Adhesive Tape-Silicones Rash   Current Outpatient Medications on File Prior to Visit  Medication Sig Dispense Refill   LEVEMIR FLEXTOUCH U-100 INSULN pen injector (concentration 100 units/mL) INJECT 5 UNITS SUBCUTANEOUSLY ONCE DAILY AND INCREASE AS DIRECTED 6   metFORMIN (GLUCOPHAGE) 1000 MG tablet TAKE 1 TABLET BY MOUTH EVERY MORNING, 1/2 TABLET MID DAY AND 1 TABLET BY MOUTH EVERY EVENING 3   prenatal vit no.124-iron-FA 27 mg iron- 800 mcg Tab Take by mouth.   No current facility-administered medications on file prior to visit.   Family History  Problem Relation Age of Onset   Diabetes Father    Social History   Tobacco Use  Smoking Status Never Smoker  Smokeless Tobacco Never Used    Social History   Socioeconomic History   Marital status: Married  Tobacco Use   Smoking status: Never Smoker   Smokeless tobacco: Never Used   Objective:   There were no vitals filed for this visit.  There is no height or weight on file to  calculate BMI.  Physical Exam Constitutional:  Appearance: Normal appearance.  Chest:  Breasts:  Right: Mass present. No inverted nipple or nipple discharge.  Left: No inverted nipple, mass or nipple discharge.   Lymphadenopathy:  Upper Body:  Right upper body: No supraclavicular or axillary adenopathy.  Left upper body: No supraclavicular or axillary adenopathy.  Neurological:  Mental Status: She is alert.     Assessment and Plan:  Right breast cancer Right  SSM, TAD  We discussed the staging and pathophysiology of breast cancer. We discussed all of the different options for treatment for breast cancer including surgery, chemotherapy, radiation therapy, Herceptin, and antiestrogen therapy.  We discussed a targeted lymph node dissection for her nodes. She only has 1 abnormal node. I think a seed guided excision of the positive node as well as a sentinel node biopsy would be the best plan. We discussed waiting for the final pathology and there is a small chance I might recommend returning for a completion axillary lymph node dissection. We discussed a 5 to 10% risk of chronic shoulder pain, neuropathic pain, as well as lymphedema. She is going to get so-so preop that we will follow her postoperatively as well.  We also discussed the excision of the breast cancer. I do not think she is a candidate for a lumpectomy. I think with the volume of the disease and reviewing her imaging I also think a nipple sparing mastectomy is not the best oncologic plan. We discussed a skin sparing mastectomy. Mastectomy can be followed by reconstruction although she will get radiation. She is due to see plastic surgery later today. We discussed that mastectomy is not 100% preventive and there is a small chance she could have a recurrence later as well.  We discussed the risks of operation as well as recovery and she is going to see plastic surgeon later today to discuss this.  Prior to surgery we also need to wait on her genetics and I discussed that if her genetics were positive the surgical plan might change. With negative genetics and a negative left-sided biopsy I do not think there is any reason for a left mastectomy. She also has pathology pending from an ultrasound-guided biopsy which sounds mostly like a cyst and will need a left MR biopsy which is being set up.  There is a concern for stage IV disease on the clavicle. She also has a left subpectoral node. She does tell me  today that she did have some trauma to this left side and that may be the reason for this. She has a PET scan pending from oncology but I think with the overall picture treating her with a curative intent is the best plan.

## 2021-07-26 ENCOUNTER — Encounter: Payer: Self-pay | Admitting: Hematology and Oncology

## 2021-07-26 DIAGNOSIS — C50911 Malignant neoplasm of unspecified site of right female breast: Secondary | ICD-10-CM | POA: Diagnosis not present

## 2021-07-26 LAB — GLUCOSE, CAPILLARY: Glucose-Capillary: 111 mg/dL — ABNORMAL HIGH (ref 70–99)

## 2021-07-26 NOTE — Discharge Summary (Signed)
Physician Discharge Summary  Patient ID: Allison Reed MRN: [id number] DOB/AGE: 08/25/1976 45 y.o.  Admit date: 07/25/2021 Discharge date: 07/26/2021  Admission Diagnoses: Right breast ca  Discharge Diagnoses:  same  Discharged Condition: stable  Hospital Course: Post operatively patient tolerated diet and was ambulatory with minimal assist. Pain controlled with oral medication. Instructed on bathing and wound care.  Treatments: surgery: right mastectomy sentinel node right breast reconstruction with tissue expander acellular dermis 8.16.22  Discharge Exam: Blood pressure 114/77, pulse 84, temperature 98.6 F (37 C), resp. rate 16, height '5\' 7"'$  (1.702 m), weight 69.8 kg, SpO2 98 %. Incision/Wound: incisions intact dry chest soft Tegderms in place drains serosanguinous  Disposition: Discharge disposition: 01-Home or Self Care       Discharge Instructions     Call MD for:  redness, tenderness, or signs of infection (pain, swelling, bleeding, redness, odor or green/yellow discharge around incision site)   Complete by: As directed    Call MD for:  temperature >100.5   Complete by: As directed    Discharge instructions   Complete by: As directed    Ok to remove dressings and shower am 8.18.22. Soap and water ok, pat Tegaderms dry. Do not remove Tegaderms. No creams or ointments over incisions. Do not let drains dangle in shower, attach to lanyard or similar.Strip and record drains twice daily and bring log to clinic visit.  Breast binder or soft compression bra all other times.  Ok to raise arms above shoulders for bathing and dressing.  No house yard work or exercise until cleared by MD.  Patient received all Rx preop. Recommend ibuprofen with meals to aid with pain control. Also ok to use Tylenol for pain. Recommend Miralax or Dulcolax as needed for constipation.   Driving Restrictions   Complete by: As directed    No driving for 2 weeks then no driving if taking narcotics    Lifting restrictions   Complete by: As directed    No lifting > 5-10 lbs until cleared by MD   Resume previous diet   Complete by: As directed       Allergies as of 07/26/2021       Reactions   Betadine [povidone Iodine] Rash   Tape Rash        Medication List     TAKE these medications    sitaGLIPtin 100 MG tablet Commonly known as: JANUVIA Take 100 mg by mouth daily.   Synjardy XR 09-999 MG Tb24 Generic drug: Empagliflozin-metFORMIN HCl ER Take by mouth.   tamoxifen 20 MG tablet Commonly known as: NOLVADEX TAKE 1 TABLET BY MOUTH EVERY DAY        Follow-up Information     [name], MD Follow up in 2 week(s).   Specialty: General Surgery Contact information: [address] [place] [address] [phone]         Irene Limbo, MD Follow up in 1 week(s).   Specialty: Plastic Surgery Why: as scheduled Contact information: [place] [address] [place] [address] [id number]                 Signed: Irene Limbo 07/26/2021, 8:00 AM

## 2021-07-26 NOTE — Discharge Instructions (Signed)
Last dose of Tylenol '1000mg'$ , Oxycodone 5 mg and Robaxin (methocarbamol) '500mg'$  all given at 0635 Wednesday AM  About my Jackson-Pratt Bulb Drain  What is a Jackson-Pratt bulb? A Jackson-Pratt is a soft, round device used to collect drainage. It is connected to a long, thin drainage catheter, which is held in place by one or two small stiches near your surgical incision site. When the bulb is squeezed, it forms a vacuum, forcing the drainage to empty into the bulb.  Emptying the Jackson-Pratt bulb- To empty the bulb: 1. Release the plug on the top of the bulb. 2. Pour the bulb's contents into a measuring container which your nurse will provide. 3. Record the time emptied and amount of drainage. Empty the drain(s) as often as your     doctor or nurse recommends.  Date                  Time                    Amount (Drain 1)                 Amount (Drain 2)  _____________________________________________________________________  _____________________________________________________________________  _____________________________________________________________________  _____________________________________________________________________  _____________________________________________________________________  _____________________________________________________________________  _____________________________________________________________________  _____________________________________________________________________  Squeezing the Jackson-Pratt Bulb- To squeeze the bulb: 1. Make sure the plug at the top of the bulb is open. 2. Squeeze the bulb tightly in your fist. You will hear air squeezing from the bulb. 3. Replace the plug while the bulb is squeezed. 4. Use a safety pin to attach the bulb to your clothing. This will keep the catheter from     pulling at the bulb insertion site.  When to call your doctor- Call your doctor if: Drain site becomes red, swollen or hot. You have a  fever greater than 101 degrees F. There is oozing at the drain site. Drain falls out (apply a guaze bandage over the drain hole and secure it with tape). Drainage increases daily not related to activity patterns. (You will usually have more drainage when you are active than when you are resting.) Drainage has a bad odor.

## 2021-07-27 ENCOUNTER — Encounter (HOSPITAL_BASED_OUTPATIENT_CLINIC_OR_DEPARTMENT_OTHER): Payer: Self-pay | Admitting: General Surgery

## 2021-07-31 ENCOUNTER — Encounter: Payer: Self-pay | Admitting: *Deleted

## 2021-08-01 NOTE — Assessment & Plan Note (Signed)
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 I discussed the margins.  We also discussed the final staging along with previously performed ER/PR testing.  Genetics: Negative MammaPrint: Low risk (this is not a valid test for 4+ lymph nodes)  Treatment plan: 1.  Adjuvant chemotherapy with dose dense Adriamycin and Cytoxan x4 followed by Taxol weekly x12 2. adjuvant radiation therapy 3.  Followed by adjuvant antiestrogen therapy with complete estrogen blockade and abemaciclib ------------------------------------------------------------------------------------------------------------------------ Chemotherapy Counseling: I discussed the risks and benefits of chemotherapy including the risks of nausea/ vomiting, risk of infection from low WBC count, fatigue due to chemo or anemia, bruising or bleeding due to low platelets, mouth sores, loss/ change in taste and decreased appetite. Liver and kidney function will be monitored through out chemotherapy as abnormalities in liver and kidney function may be a side effect of treatment. Cardiac dysfunction due to Adriamycin and neuropathy risk from Taxol were discussed in detail. Risk of permanent bone marrow dysfunction and leukemia due to chemo were also discussed.

## 2021-08-01 NOTE — Progress Notes (Signed)
Patient Care Team: [name], MD as PCP - General (Endocrinology) Mauro Kaufmann, RN as Oncology Nurse Navigator Rockwell Germany, RN as Oncology Nurse Navigator  DIAGNOSIS:    ICD-10-CM   1. Malignant neoplasm of overlapping sites of right breast in female, estrogen receptor positive ([place])  C50.811    Z17.0       SUMMARY OF ONCOLOGIC HISTORY: Oncology History Overview Note  Ductal carcinoma in situ of the right breast  She palpated a lump in her right breast. Diagnostic mammogram and [place] on 06/08/21 showed irregular hypoechoic lesions in the right breast with a single abnormal right axillary lymph node without normal cortex. Biopsy on 06/16/21 showed high grade DCIS with calcifications and necrosis.   Malignant neoplasm of overlapping sites of right breast in female, estrogen receptor positive ([place])  06/16/2021 Initial Diagnosis   Palpable right breast mass: Breast MRI revealed extensive involvement of the right breast with mass and non-mass enhancement superior right breast mass measures 4.9 x 1.2 cm and together with non-mass enhancement measured 8.8 cm, enhancing mass LOQ 1.3 cm, left breast indeterminate 0.9 cm mass and a 0.8 cm mass UOQ, single abnormal lymph node Biopsy 10:00, 11:00 and 12:00: IDC with DCIS, biopsy right axillary lymph node: IDC, ER 75 to 95%, PR 90 to 95%, Ki-67 25%, HER2 negative on 1 biopsy and 2+ by IHC and FISH pending   06/21/2021 Cancer Staging   Staging form: Breast, AJCC 8th Edition - Clinical stage from 06/21/2021: Stage IIA (cT3, cN1, cM0, G2, ER+, PR+, HER2-) - Signed by [name], MD on 06/21/2021 Histologic grading system: 3 grade system   07/01/2021 Genetic Testing   Negative genetic testing:  No pathogenic variants detected on the [facility] BRCAplus panel (report date 07/01/2021) or the [employer] CancerNext-Expanded + RNAinsight panel (report date 07/01/2021).   The BRCAplus panel offered by [employer] and includes sequencing and  deletion/duplication analysis for the following 8 genes: ATM, BRCA1, BRCA2, CDH1, CHEK2, PALB2, PTEN, and TP53. The CancerNext-Expanded + RNAinsight gene panel offered by [employer] and includes sequencing and rearrangement analysis for the following 77 genes: AIP, ALK, APC, ATM, AXIN2, BAP1, BARD1, BLM, BMPR1A, BRCA1, BRCA2, BRIP1, CDC73, CDH1, CDK4, CDKN1B, CDKN2A, CHEK2, CTNNA1, DICER1, FANCC, FH, FLCN, GALNT12, KIF1B, LZTR1, MAX, MEN1, MET, MLH1, MSH2, MSH3, MSH6, MUTYH, NBN, NF1, NF2, NTHL1, PALB2, PHOX2B, PMS2, POT1, PRKAR1A, PTCH1, PTEN, RAD51C, RAD51D, RB1, RECQL, RET, SDHA, SDHAF2, SDHB, SDHC, SDHD, SMAD4, SMARCA4, SMARCB1, SMARCE1, STK11, SUFU, TMEM127, TP53, TSC1, TSC2, VHL and XRCC2 (sequencing and deletion/duplication); EGFR, EGLN1, HOXB13, KIT, MITF, PDGFRA, POLD1 and POLE (sequencing only); EPCAM and GREM1 (deletion/duplication only). RNA data is routinely analyzed for use in variant interpretation for all genes.   07/25/2021 Surgery   Right mastectomy: Foci of invasive ductal carcinoma measuring 1.1 cm grade 2 with extensive DCIS spanning 8.9 cm, margins negative, 4/7 lymph nodes, ER 75 to 95%, PR 9095%, HER2 negative, Ki-67 25%     CHIEF COMPLIANT: Follow-up of right breast cancer  INTERVAL HISTORY: NEOLA WORRALL is a 45 y.o. with above-mentioned history of invasive ductal carcinoma of the right breast. She underwent right mastectomy on 07/25/21 with Dr. [name] for which the pathology showed grade 2 invasive ductal carcinoma, intermediate to high-grade DCIS with calcifications, resection margins negative for invasive carcinoma and DCIS less than 1 mm from anterior and posterior margins; 4 right axillary lymph nodes positive for metastatic carcinoma. She presents to the clinic today for follow-up.   ALLERGIES:  is  allergic to betadine [povidone iodine] and tape.  MEDICATIONS:  Current Outpatient Medications  Medication Sig Dispense Refill   Empagliflozin-metFORMIN HCl ER  (SYNJARDY XR) 09-999 MG TB24 Take by mouth.     sitaGLIPtin (JANUVIA) 100 MG tablet Take 100 mg by mouth daily.     tamoxifen (NOLVADEX) 20 MG tablet TAKE 1 TABLET BY MOUTH EVERY DAY 90 tablet 0   No current facility-administered medications for this visit.    PHYSICAL EXAMINATION: ECOG PERFORMANCE STATUS: 1 - Symptomatic but completely ambulatory  Vitals:   08/02/21 1154  BP: 98/69  Pulse: 81  Resp: 18  Temp: 98.1 F (36.7 C)  SpO2: 100%   Filed Weights   08/02/21 1154  Weight: 149 lb 4.8 oz (67.7 kg)      LABORATORY DATA:  I have reviewed the data as listed CMP Latest Ref Rng & Units 07/21/2021 06/21/2021 10/11/2017  Glucose 70 - 99 mg/dL 123(H) 167(H) 121(H)  BUN 6 - 20 mg/dL 8 7 <5(L)  Creatinine 0.44 - 1.00 mg/dL 0.57 0.74 0.40(L)  Sodium 135 - 145 mmol/L 136 140 135  Potassium 3.5 - 5.1 mmol/L 4.1 4.2 3.5  Chloride 98 - 111 mmol/L 104 103 99(L)  CO2 22 - 32 mmol/L $RemoveB'23 25 23  'YTpGgUTf$ Calcium 8.9 - 10.3 mg/dL 9.2 9.3 8.6(L)  Total Protein 6.5 - 8.1 g/dL - 7.4 6.6  Total Bilirubin 0.3 - 1.2 mg/dL - 0.3 0.7  Alkaline Phos 38 - 126 U/L - 52 33(L)  AST 15 - 41 U/L - 33 20  ALT 0 - 44 U/L - 49(H) 17    Lab Results  Component Value Date   WBC 6.4 06/21/2021   HGB 15.8 (H) 06/21/2021   HCT 47.1 (H) 06/21/2021   MCV 81.1 06/21/2021   PLT 276 06/21/2021   NEUTROABS 2.7 06/21/2021    ASSESSMENT & PLAN:  Malignant neoplasm of overlapping sites of right breast in female, estrogen receptor positive ([place]) 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, margins negative, 4/7 lymph nodes, ER 75 to 95%, PR 9095%, HER2 negative, Ki-67 25%  Pathology counseling: I discussed the final pathology report of the patient provided  a copy of this report. I discussed the margins.  We also discussed the final staging along with previously performed ER/PR testing.  Genetics: Negative MammaPrint: Low risk (this is not  a valid test for 4+ lymph nodes)  Treatment plan: 1.  Adjuvant chemotherapy with dose dense Adriamycin and Cytoxan x4 followed by Taxol weekly x12 2. adjuvant radiation therapy 3.  Followed by adjuvant antiestrogen therapy with complete estrogen blockade and abemaciclib ------------------------------------------------------------------------------------------------------------------------ Chemotherapy Counseling: I discussed the risks and benefits of chemotherapy including the risks of nausea/ vomiting, risk of infection from low WBC count, fatigue due to chemo or anemia, bruising or bleeding due to low platelets, mouth sores, loss/ change in taste and decreased appetite. Liver and kidney function will be monitored through out chemotherapy as abnormalities in liver and kidney function may be a side effect of treatment. Cardiac dysfunction due to Adriamycin and neuropathy risk from Taxol were discussed in detail. Risk of permanent bone marrow dysfunction and leukemia due to chemo were also discussed.  We counseled her about nausea prevention study Echocardiogram, chemo class, start chemotherapy in about 3 weeks. Emotional turmoil: I offered her counselors to help her cope.  She instructed me to hold off on referring her at this time.  I provided her with  information regarding her support groups as well as kids [facility] for kids counseling.  Because of her history of diabetes, we decided not to give her dexamethasone. Return to clinic to start chemotherapy.  No orders of the defined types were placed in this encounter.  The patient has a good understanding of the overall plan. she agrees with it. she will call with any problems that may develop before the next visit here.  Total time spent: 30 mins including face to face time and time spent for planning, charting and coordination of care  Rulon Eisenmenger, MD, MPH 08/02/2021  I, Thana Ates, am acting as scribe for Dr. [name].  I have  reviewed the above documentation for accuracy and completeness, and I agree with the above.

## 2021-08-02 ENCOUNTER — Inpatient Hospital Stay: Payer: 59 | Admitting: Emergency Medicine

## 2021-08-02 ENCOUNTER — Inpatient Hospital Stay: Payer: 59 | Attending: Hematology and Oncology | Admitting: Hematology and Oncology

## 2021-08-02 ENCOUNTER — Other Ambulatory Visit: Payer: Self-pay

## 2021-08-02 VITALS — BP 98/69 | HR 81 | Temp 98.1°F | Resp 18 | Ht 67.0 in | Wt 149.3 lb

## 2021-08-02 DIAGNOSIS — Z79810 Long term (current) use of selective estrogen receptor modulators (SERMs): Secondary | ICD-10-CM | POA: Diagnosis not present

## 2021-08-02 DIAGNOSIS — Z7984 Long term (current) use of oral hypoglycemic drugs: Secondary | ICD-10-CM | POA: Diagnosis not present

## 2021-08-02 DIAGNOSIS — Z170 Estrogen receptor positive status [ER+]: Secondary | ICD-10-CM

## 2021-08-02 DIAGNOSIS — Z9011 Acquired absence of right breast and nipple: Secondary | ICD-10-CM | POA: Diagnosis not present

## 2021-08-02 DIAGNOSIS — C50811 Malignant neoplasm of overlapping sites of right female breast: Secondary | ICD-10-CM | POA: Insufficient documentation

## 2021-08-02 DIAGNOSIS — E119 Type 2 diabetes mellitus without complications: Secondary | ICD-10-CM | POA: Insufficient documentation

## 2021-08-02 MED ORDER — PROCHLORPERAZINE MALEATE 10 MG PO TABS: 10.0000 mg | ORAL_TABLET | Freq: Four times a day (QID) | ORAL | 1 refills | Status: DC | PRN

## 2021-08-02 MED ORDER — LIDOCAINE-PRILOCAINE 2.5-2.5 % EX CREA: TOPICAL_CREAM | CUTANEOUS | 3 refills | Status: DC

## 2021-08-02 MED ORDER — ONDANSETRON HCL 8 MG PO TABS: 8.0000 mg | ORAL_TABLET | Freq: Two times a day (BID) | ORAL | 1 refills | Status: DC | PRN

## 2021-08-02 NOTE — Research (Signed)
[id number] - TREATMENT OF REFRACTORY NAUSEA  Patient Allison Reed was identified by MD Lindi Adie as a potential candidate for the above listed study.  This Clinical Research Nurse met with Allison Reed, [id number], on 08/02/21 in a manner and location that ensures patient privacy to discuss participation in the above listed research study.  Patient is Accompanied by spouse Allison Reed .  A copy of the informed consent document and separate HIPAA Authorization was provided to the patient.  Patient reads, speaks, and understands [place].   Patient was provided with the business card of this Nurse and encouraged to contact the research team with any questions.  Approximately 20 minutes were spent with the patient reviewing the informed consent documents.  Patient was provided the option of taking informed consent documents home to review and was encouraged to review at their convenience with their support network, including other care providers. Patient took the consent documents home to review.  Will f/u with patient in next few days to determine potential interest in study.  Wells Guiles 'Hulett, RN, BSN Clinical Research Nurse I 08/02/21 1:33 PM

## 2021-08-02 NOTE — Progress Notes (Signed)
START ON PATHWAY REGIMEN - Breast     Cycles 1 through 4: A cycle is every 14 days:     Doxorubicin      Cyclophosphamide      Pegfilgrastim-xxxx    Cycles 5 through 16: A cycle is every 7 days:     Paclitaxel   **Always confirm dose/schedule in your pharmacy ordering system**  Patient Characteristics: Postoperative without Neoadjuvant Therapy (Pathologic Staging), Invasive Disease, Adjuvant Therapy, HER2 Negative/Unknown/Equivocal, ER Positive, Node Positive, Node Positive (4+) Therapeutic Status: Postoperative without Neoadjuvant Therapy (Pathologic Staging) AJCC Grade: G2 AJCC N Category: pN2 AJCC M Category: cM0 ER Status: Positive (+) AJCC 8 Stage Grouping: IB HER2 Status: Negative (-) Oncotype Dx Recurrence Score: Not Appropriate AJCC T Category: pT1c PR Status: Positive (+) Adjuvant Therapy Status: No Adjuvant Therapy Received Yet or Changing Initial Adjuvant Regimen due to Tolerance Intent of Therapy: Curative Intent, Discussed with Patient 

## 2021-08-03 ENCOUNTER — Other Ambulatory Visit: Payer: Self-pay | Admitting: General Surgery

## 2021-08-03 ENCOUNTER — Encounter: Payer: Self-pay | Admitting: *Deleted

## 2021-08-03 ENCOUNTER — Telehealth: Payer: Self-pay | Admitting: Emergency Medicine

## 2021-08-03 DIAGNOSIS — C50811 Malignant neoplasm of overlapping sites of right female breast: Secondary | ICD-10-CM

## 2021-08-03 DIAGNOSIS — Z170 Estrogen receptor positive status [ER+]: Secondary | ICD-10-CM

## 2021-08-03 NOTE — Telephone Encounter (Signed)
[id number] - TREATMENT OF REFRACTORY NAUSEA  Contacted pt regarding ineligibility for study based on history/current uncontrolled hyperglycemia.  Pt denies any questions/concerns at this time, aware to f/u as needed.  MD Gudena aware.  Wells Guiles 'Marianne, RN, BSN Clinical Research Nurse I 08/03/21 9:52 AM

## 2021-08-07 ENCOUNTER — Telehealth: Payer: Self-pay | Admitting: Hematology and Oncology

## 2021-08-07 ENCOUNTER — Encounter: Payer: Self-pay | Admitting: *Deleted

## 2021-08-07 NOTE — Telephone Encounter (Signed)
Scheduled appts per 8/26 sch msg. Called pt, no answer. Left msg with appts dates and times. Will alos have updated calendar printed for pt at next visit.

## 2021-08-16 ENCOUNTER — Inpatient Hospital Stay: Payer: 59 | Admitting: Emergency Medicine

## 2021-08-16 ENCOUNTER — Other Ambulatory Visit: Payer: 59

## 2021-08-16 ENCOUNTER — Encounter (HOSPITAL_BASED_OUTPATIENT_CLINIC_OR_DEPARTMENT_OTHER): Payer: Self-pay | Admitting: General Surgery

## 2021-08-16 ENCOUNTER — Other Ambulatory Visit: Payer: Self-pay

## 2021-08-16 ENCOUNTER — Inpatient Hospital Stay: Payer: 59 | Attending: Hematology and Oncology

## 2021-08-16 ENCOUNTER — Encounter: Payer: Self-pay | Admitting: Hematology and Oncology

## 2021-08-16 DIAGNOSIS — Z79899 Other long term (current) drug therapy: Secondary | ICD-10-CM | POA: Insufficient documentation

## 2021-08-16 DIAGNOSIS — Z170 Estrogen receptor positive status [ER+]: Secondary | ICD-10-CM | POA: Insufficient documentation

## 2021-08-16 DIAGNOSIS — Z5189 Encounter for other specified aftercare: Secondary | ICD-10-CM | POA: Insufficient documentation

## 2021-08-16 DIAGNOSIS — E119 Type 2 diabetes mellitus without complications: Secondary | ICD-10-CM | POA: Insufficient documentation

## 2021-08-16 DIAGNOSIS — K219 Gastro-esophageal reflux disease without esophagitis: Secondary | ICD-10-CM | POA: Insufficient documentation

## 2021-08-16 DIAGNOSIS — Z7984 Long term (current) use of oral hypoglycemic drugs: Secondary | ICD-10-CM | POA: Insufficient documentation

## 2021-08-16 DIAGNOSIS — Z5111 Encounter for antineoplastic chemotherapy: Secondary | ICD-10-CM | POA: Insufficient documentation

## 2021-08-16 DIAGNOSIS — Z9011 Acquired absence of right breast and nipple: Secondary | ICD-10-CM | POA: Insufficient documentation

## 2021-08-16 DIAGNOSIS — R000 Tachycardia, unspecified: Secondary | ICD-10-CM | POA: Insufficient documentation

## 2021-08-16 DIAGNOSIS — L299 Pruritus, unspecified: Secondary | ICD-10-CM | POA: Insufficient documentation

## 2021-08-16 DIAGNOSIS — C50811 Malignant neoplasm of overlapping sites of right female breast: Secondary | ICD-10-CM

## 2021-08-16 DIAGNOSIS — J069 Acute upper respiratory infection, unspecified: Secondary | ICD-10-CM | POA: Insufficient documentation

## 2021-08-16 NOTE — Research (Signed)
DCP-001: Use of a Clinical Trial Screening Tool to Address Cancer Health Disparities in the [place] Program [facility])  Patient Allison Reed was identified by this Clinical Research Nurse as a potential candidate for the above listed study.  This Clinical Research Nurse met with ORPHIA MCTIGUE, [id number], on 08/16/21 in a manner and location that ensures patient privacy to discuss participation in the above listed research study.  Patient is Unaccompanied.  A copy of the informed consent document and separate HIPAA Authorization was provided to the patient.  Patient reads, speaks, and understands [place].    Patient was provided with the business card of this Nurse and encouraged to contact the research team with any questions.  Patient was provided the option of taking informed consent documents home to review and was encouraged to review at their convenience with their support network, including other care providers. Patient is comfortable with making a decision regarding study participation today.  As outlined in the informed consent form, this Nurse and Angeline Slim discussed the purpose of the research study, the investigational nature of the study, study procedures and requirements for study participation, potential risks and benefits of study participation, as well as alternatives to participation. This study is not blinded. The patient understands participation is voluntary and they may withdraw from study participation at any time.  This study does not involve randomization.  This study does not involve an investigational drug or device. This study does not involve a placebo. Patient understands enrollment is pending full eligibility review.   Confidentiality and how the patient's information will be used as part of study participation were discussed.  Patient was informed there is not reimbursement provided for their time and effort spent on trial participation.  The  patient is encouraged to discuss research study participation with their insurance provider to determine what costs they may incur as part of study participation, including research related injury.    All questions were answered to patient's satisfaction.  The informed consent and separate HIPAA Authorization was reviewed page by page.  The patient's mental and emotional status is appropriate to provide informed consent, and the patient verbalizes an understanding of study participation.  Patient has agreed to participate in the above listed research study and has voluntarily signed the informed consent version date 06/02/2019 and separate HIPAA Authorization, version 5  on 08/16/21 at 11:36 AM.  The patient was provided with a copy of the signed informed consent form and separate HIPAA Authorization for their reference.  No study specific procedures were obtained prior to the signing of the informed consent document.  Approximately 20 minutes were spent with the patient reviewing the informed consent documents.  Patient was not requested to complete a Release of Information form.  Wells Guiles 'Learta CoddingNeysa Bonito, RN, BSN Clinical Research Nurse I 08/16/21 11:56 AM

## 2021-08-16 NOTE — Progress Notes (Signed)
Met with patient at registration to introduce myself as Arboriculturist and to offer available resources.  Discussed one-time $1000 [employer] grant to assist with personal expenses while going through treatment. Advised what is needed to apply.  Also discussed available copay assistance for specific treatment drugs if she has not met her ded/OOP. Patient thinks she has with the surgery.  Gave her my card if interested in applying and for any additional financial questions or concerns.

## 2021-08-18 ENCOUNTER — Other Ambulatory Visit: Payer: Self-pay

## 2021-08-18 ENCOUNTER — Ambulatory Visit (HOSPITAL_COMMUNITY)
Admission: RE | Admit: 2021-08-18 | Discharge: 2021-08-18 | Disposition: A | Discharge status: Home / Self Care | Payer: 59 | Source: Ambulatory Visit | Attending: Hematology and Oncology | Admitting: Hematology and Oncology

## 2021-08-18 DIAGNOSIS — C50811 Malignant neoplasm of overlapping sites of right female breast: Secondary | ICD-10-CM | POA: Insufficient documentation

## 2021-08-18 DIAGNOSIS — Z170 Estrogen receptor positive status [ER+]: Secondary | ICD-10-CM | POA: Insufficient documentation

## 2021-08-18 DIAGNOSIS — Z0189 Encounter for other specified special examinations: Secondary | ICD-10-CM

## 2021-08-18 DIAGNOSIS — Z5111 Encounter for antineoplastic chemotherapy: Secondary | ICD-10-CM | POA: Insufficient documentation

## 2021-08-18 DIAGNOSIS — E119 Type 2 diabetes mellitus without complications: Secondary | ICD-10-CM | POA: Diagnosis not present

## 2021-08-18 LAB — ECHOCARDIOGRAM COMPLETE
Area-P 1/2: 3.54 cm2
Calc EF: 62.6 %
S' Lateral: 2.8 cm
Single Plane A2C EF: 67.1 %
Single Plane A4C EF: 63 %

## 2021-08-18 NOTE — Progress Notes (Signed)
Pharmacist Chemotherapy Monitoring - Initial Assessment    Anticipated start date: 08/25/21   The following has been reviewed per standard work regarding the patient's treatment regimen: The patient's diagnosis, treatment plan and drug doses, and organ/hematologic function Lab orders and baseline tests specific to treatment regimen  The treatment plan start date, drug sequencing, and pre-medications Prior authorization status  Patient's documented medication list, including drug-drug interaction screen and prescriptions for anti-emetics and supportive care specific to the treatment regimen The drug concentrations, fluid compatibility, administration routes, and timing of the medications to be used The patient's access for treatment and lifetime cumulative dose history, if applicable  The patient's medication allergies and previous infusion related reactions, if applicable   Changes made to treatment plan:  N/A  Follow up needed:  N/A   Philomena Course, [place], 08/18/2021  11:27 AM

## 2021-08-18 NOTE — Progress Notes (Signed)
  Echocardiogram 2D Echocardiogram has been performed.  Allison Reed 08/18/2021, 10:08 AM

## 2021-08-22 ENCOUNTER — Encounter (HOSPITAL_BASED_OUTPATIENT_CLINIC_OR_DEPARTMENT_OTHER)
Admission: RE | Admit: 2021-08-22 | Discharge: 2021-08-22 | Disposition: A | Discharge status: Home / Self Care | Payer: 59 | Source: Ambulatory Visit | Attending: General Surgery | Admitting: General Surgery

## 2021-08-22 DIAGNOSIS — Z01812 Encounter for preprocedural laboratory examination: Secondary | ICD-10-CM | POA: Diagnosis not present

## 2021-08-22 DIAGNOSIS — Z888 Allergy status to other drugs, medicaments and biological substances status: Secondary | ICD-10-CM | POA: Diagnosis not present

## 2021-08-22 DIAGNOSIS — Z9011 Acquired absence of right breast and nipple: Secondary | ICD-10-CM | POA: Diagnosis not present

## 2021-08-22 DIAGNOSIS — Z170 Estrogen receptor positive status [ER+]: Secondary | ICD-10-CM | POA: Diagnosis not present

## 2021-08-22 DIAGNOSIS — C50811 Malignant neoplasm of overlapping sites of right female breast: Secondary | ICD-10-CM | POA: Diagnosis not present

## 2021-08-22 DIAGNOSIS — Z794 Long term (current) use of insulin: Secondary | ICD-10-CM | POA: Diagnosis not present

## 2021-08-22 LAB — BASIC METABOLIC PANEL
Anion gap: 9 (ref 5–15)
BUN: 6 mg/dL (ref 6–20)
CO2: 25 mmol/L (ref 22–32)
Calcium: 8.9 mg/dL (ref 8.9–10.3)
Chloride: 100 mmol/L (ref 98–111)
Creatinine, Ser: 0.53 mg/dL (ref 0.44–1.00)
GFR, Estimated: >60 mL/min (ref >60)
Glucose, Bld: 154 mg/dL — ABNORMAL HIGH (ref 70–99)
Potassium: 4.2 mmol/L (ref 3.5–5.1)
Sodium: 134 mmol/L — ABNORMAL LOW (ref 135–145)

## 2021-08-22 MED ORDER — ENSURE PRE-SURGERY PO LIQD: 296.0000 mL | Freq: Once | ORAL | Status: DC

## 2021-08-22 NOTE — Progress Notes (Signed)

## 2021-08-24 ENCOUNTER — Other Ambulatory Visit: Payer: Self-pay

## 2021-08-24 ENCOUNTER — Ambulatory Visit (HOSPITAL_BASED_OUTPATIENT_CLINIC_OR_DEPARTMENT_OTHER): Payer: 59 | Admitting: Anesthesiology

## 2021-08-24 ENCOUNTER — Ambulatory Visit (HOSPITAL_COMMUNITY): Payer: 59

## 2021-08-24 ENCOUNTER — Ambulatory Visit (HOSPITAL_BASED_OUTPATIENT_CLINIC_OR_DEPARTMENT_OTHER)
Admission: RE | Admit: 2021-08-24 | Discharge: 2021-08-24 | Disposition: A | Discharge status: Home / Self Care | Payer: 59 | Attending: General Surgery | Admitting: General Surgery

## 2021-08-24 ENCOUNTER — Encounter (HOSPITAL_BASED_OUTPATIENT_CLINIC_OR_DEPARTMENT_OTHER)
Admission: RE | Disposition: A | Discharge status: Home / Self Care | Payer: Self-pay | Source: Home / Self Care | Attending: General Surgery

## 2021-08-24 ENCOUNTER — Encounter (HOSPITAL_BASED_OUTPATIENT_CLINIC_OR_DEPARTMENT_OTHER): Payer: Self-pay | Admitting: General Surgery

## 2021-08-24 DIAGNOSIS — C50811 Malignant neoplasm of overlapping sites of right female breast: Secondary | ICD-10-CM | POA: Diagnosis not present

## 2021-08-24 DIAGNOSIS — Z794 Long term (current) use of insulin: Secondary | ICD-10-CM | POA: Insufficient documentation

## 2021-08-24 DIAGNOSIS — Z9011 Acquired absence of right breast and nipple: Secondary | ICD-10-CM | POA: Insufficient documentation

## 2021-08-24 DIAGNOSIS — Z170 Estrogen receptor positive status [ER+]: Secondary | ICD-10-CM | POA: Insufficient documentation

## 2021-08-24 DIAGNOSIS — Z888 Allergy status to other drugs, medicaments and biological substances status: Secondary | ICD-10-CM | POA: Insufficient documentation

## 2021-08-24 DIAGNOSIS — Z419 Encounter for procedure for purposes other than remedying health state, unspecified: Secondary | ICD-10-CM

## 2021-08-24 HISTORY — PX: PORTACATH PLACEMENT: SHX2246

## 2021-08-24 LAB — GLUCOSE, CAPILLARY
Glucose-Capillary: 146 mg/dL — ABNORMAL HIGH (ref 70–99)
Glucose-Capillary: 123 mg/dL — ABNORMAL HIGH (ref 70–99)

## 2021-08-24 LAB — POCT PREGNANCY, URINE: Preg Test, Ur: NEGATIVE

## 2021-08-24 SURGERY — INSERTION, TUNNELED CENTRAL VENOUS DEVICE, WITH PORT
Anesthesia: General | Site: Breast

## 2021-08-24 MED ORDER — SUCCINYLCHOLINE CHLORIDE 200 MG/10ML IV SOSY
PREFILLED_SYRINGE | INTRAVENOUS | Status: AC
  Filled 2021-08-24: qty 10

## 2021-08-24 MED ORDER — PHENYLEPHRINE 40 MCG/ML (10ML) SYRINGE FOR IV PUSH (FOR BLOOD PRESSURE SUPPORT)
PREFILLED_SYRINGE | INTRAVENOUS | Status: AC
  Filled 2021-08-24: qty 10

## 2021-08-24 MED ORDER — CHLORHEXIDINE GLUCONATE CLOTH 2 % EX PADS: 6.0000 | MEDICATED_PAD | Freq: Once | CUTANEOUS | Status: DC

## 2021-08-24 MED ORDER — LIDOCAINE HCL (CARDIAC) PF 100 MG/5ML IV SOSY
PREFILLED_SYRINGE | INTRAVENOUS | Status: DC | PRN
  Administered 2021-08-24: 60 mg via INTRAVENOUS

## 2021-08-24 MED ORDER — PHENYLEPHRINE HCL (PRESSORS) 10 MG/ML IV SOLN
INTRAVENOUS | Status: DC | PRN
  Administered 2021-08-24: 80 ug via INTRAVENOUS

## 2021-08-24 MED ORDER — HEPARIN (PORCINE) IN NACL 2-0.9 UNITS/ML
INTRAMUSCULAR | Status: AC | PRN
  Administered 2021-08-24: 1 via INTRAVENOUS

## 2021-08-24 MED ORDER — PROMETHAZINE HCL 25 MG/ML IJ SOLN: 6.2500 mg | INTRAMUSCULAR | Status: DC | PRN

## 2021-08-24 MED ORDER — AMISULPRIDE (ANTIEMETIC) 5 MG/2ML IV SOLN: 10.0000 mg | Freq: Once | INTRAVENOUS | Status: DC | PRN

## 2021-08-24 MED ORDER — DIPHENHYDRAMINE HCL 50 MG/ML IJ SOLN
INTRAMUSCULAR | Status: DC | PRN
  Administered 2021-08-24: 6.25 mg via INTRAVENOUS

## 2021-08-24 MED ORDER — HEPARIN (PORCINE) IN NACL 1000-0.9 UT/500ML-% IV SOLN
INTRAVENOUS | Status: AC
  Filled 2021-08-24: qty 500

## 2021-08-24 MED ORDER — ACETAMINOPHEN 500 MG PO TABS
1000.0000 mg | ORAL_TABLET | ORAL | Status: AC
  Administered 2021-08-24: 1000 mg via ORAL

## 2021-08-24 MED ORDER — CEFAZOLIN SODIUM-DEXTROSE 2-4 GM/100ML-% IV SOLN
2.0000 g | INTRAVENOUS | Status: AC
  Administered 2021-08-24: 2 g via INTRAVENOUS

## 2021-08-24 MED ORDER — LACTATED RINGERS IV SOLN: INTRAVENOUS | Status: DC

## 2021-08-24 MED ORDER — OXYCODONE HCL 5 MG/5ML PO SOLN: 5.0000 mg | Freq: Once | ORAL | Status: DC | PRN

## 2021-08-24 MED ORDER — MIDAZOLAM HCL 5 MG/5ML IJ SOLN
INTRAMUSCULAR | Status: DC | PRN
  Administered 2021-08-24: 2 mg via INTRAVENOUS

## 2021-08-24 MED ORDER — EPHEDRINE SULFATE 50 MG/ML IJ SOLN
INTRAMUSCULAR | Status: DC | PRN
  Administered 2021-08-24: 10 mg via INTRAVENOUS

## 2021-08-24 MED ORDER — HEPARIN SOD (PORK) LOCK FLUSH 100 UNIT/ML IV SOLN
INTRAVENOUS | Status: DC | PRN
  Administered 2021-08-24: 500 [IU]

## 2021-08-24 MED ORDER — FENTANYL CITRATE (PF) 100 MCG/2ML IJ SOLN
INTRAMUSCULAR | Status: DC | PRN
  Administered 2021-08-24 (×2): 50 ug via INTRAVENOUS

## 2021-08-24 MED ORDER — BUPIVACAINE HCL (PF) 0.25 % IJ SOLN
INTRAMUSCULAR | Status: AC
  Filled 2021-08-24: qty 150

## 2021-08-24 MED ORDER — DIPHENHYDRAMINE HCL 50 MG/ML IJ SOLN
INTRAMUSCULAR | Status: AC
  Filled 2021-08-24: qty 1

## 2021-08-24 MED ORDER — LIDOCAINE 2% (20 MG/ML) 5 ML SYRINGE
INTRAMUSCULAR | Status: AC
  Filled 2021-08-24: qty 5

## 2021-08-24 MED ORDER — MEPERIDINE HCL 25 MG/ML IJ SOLN: 6.2500 mg | INTRAMUSCULAR | Status: DC | PRN

## 2021-08-24 MED ORDER — DEXAMETHASONE SODIUM PHOSPHATE 10 MG/ML IJ SOLN
INTRAMUSCULAR | Status: AC
  Filled 2021-08-24: qty 1

## 2021-08-24 MED ORDER — EPHEDRINE 5 MG/ML INJ
INTRAVENOUS | Status: AC
  Filled 2021-08-24: qty 5

## 2021-08-24 MED ORDER — BUPIVACAINE HCL (PF) 0.25 % IJ SOLN
INTRAMUSCULAR | Status: DC | PRN
  Administered 2021-08-24: 4 mL

## 2021-08-24 MED ORDER — ACETAMINOPHEN 500 MG PO TABS
ORAL_TABLET | ORAL | Status: AC
  Filled 2021-08-24: qty 2

## 2021-08-24 MED ORDER — ONDANSETRON HCL 4 MG/2ML IJ SOLN
INTRAMUSCULAR | Status: AC
  Filled 2021-08-24: qty 2

## 2021-08-24 MED ORDER — HEPARIN SOD (PORK) LOCK FLUSH 100 UNIT/ML IV SOLN
INTRAVENOUS | Status: AC
  Filled 2021-08-24: qty 5

## 2021-08-24 MED ORDER — PROPOFOL 500 MG/50ML IV EMUL
INTRAVENOUS | Status: AC
  Filled 2021-08-24: qty 50

## 2021-08-24 MED ORDER — OXYCODONE HCL 5 MG PO TABS: 5.0000 mg | ORAL_TABLET | Freq: Once | ORAL | Status: DC | PRN

## 2021-08-24 MED ORDER — KETOROLAC TROMETHAMINE 30 MG/ML IJ SOLN: 30.0000 mg | Freq: Once | INTRAMUSCULAR | Status: DC | PRN

## 2021-08-24 MED ORDER — CEFAZOLIN SODIUM-DEXTROSE 2-4 GM/100ML-% IV SOLN
INTRAVENOUS | Status: AC
  Filled 2021-08-24: qty 100

## 2021-08-24 MED ORDER — MIDAZOLAM HCL 2 MG/2ML IJ SOLN
INTRAMUSCULAR | Status: AC
  Filled 2021-08-24: qty 2

## 2021-08-24 MED ORDER — ACETAMINOPHEN 500 MG PO TABS: 1000.0000 mg | ORAL_TABLET | Freq: Once | ORAL | Status: DC

## 2021-08-24 MED ORDER — ONDANSETRON HCL 4 MG/2ML IJ SOLN
INTRAMUSCULAR | Status: DC | PRN
  Administered 2021-08-24: 4 mg via INTRAVENOUS

## 2021-08-24 MED ORDER — PROPOFOL 10 MG/ML IV BOLUS
INTRAVENOUS | Status: DC | PRN
  Administered 2021-08-24: 100 mg via INTRAVENOUS

## 2021-08-24 MED ORDER — HYDROMORPHONE HCL 1 MG/ML IJ SOLN: 0.2500 mg | INTRAMUSCULAR | Status: DC | PRN

## 2021-08-24 MED ORDER — DEXAMETHASONE SODIUM PHOSPHATE 4 MG/ML IJ SOLN
INTRAMUSCULAR | Status: DC | PRN
  Administered 2021-08-24: 5 mg via INTRAVENOUS

## 2021-08-24 MED ORDER — FENTANYL CITRATE (PF) 100 MCG/2ML IJ SOLN
INTRAMUSCULAR | Status: AC
  Filled 2021-08-24: qty 2

## 2021-08-24 MED FILL — Fosaprepitant Dimeglumine For IV Infusion 150 MG (Base Eq): INTRAVENOUS | Qty: 5 | Status: AC

## 2021-08-24 SURGICAL SUPPLY — 49 items
ADH SKN CLS APL DERMABOND .7 (GAUZE/BANDAGES/DRESSINGS) ×1
APL PRP STRL LF DISP 70% ISPRP (MISCELLANEOUS) ×1
APL SKNCLS STERI-STRIP NONHPOA (GAUZE/BANDAGES/DRESSINGS) ×1
BAG DECANTER FOR FLEXI CONT (MISCELLANEOUS) ×2 IMPLANT
BENZOIN TINCTURE PRP APPL 2/3 (GAUZE/BANDAGES/DRESSINGS) ×2 IMPLANT
BLADE SURG 11 STRL SS (BLADE) ×2 IMPLANT
BLADE SURG 15 STRL LF DISP TIS (BLADE) ×1 IMPLANT
BLADE SURG 15 STRL SS (BLADE) ×2
CANISTER SUCT 1200ML W/VALVE (MISCELLANEOUS) IMPLANT
CHLORAPREP W/TINT 26 (MISCELLANEOUS) ×2 IMPLANT
COVER BACK TABLE 60X90IN (DRAPES) ×2 IMPLANT
COVER MAYO STAND STRL (DRAPES) ×2 IMPLANT
COVER PROBE 5X48 (MISCELLANEOUS)
DECANTER SPIKE VIAL GLASS SM (MISCELLANEOUS) IMPLANT
DERMABOND ADVANCED (GAUZE/BANDAGES/DRESSINGS) ×1
DERMABOND ADVANCED .7 DNX12 (GAUZE/BANDAGES/DRESSINGS) ×1 IMPLANT
DRAPE C-ARM 42X72 X-RAY (DRAPES) ×2 IMPLANT
DRAPE LAPAROSCOPIC ABDOMINAL (DRAPES) ×2 IMPLANT
DRAPE UTILITY XL STRL (DRAPES) ×2 IMPLANT
DRSG TEGADERM 4X4.75 (GAUZE/BANDAGES/DRESSINGS) ×4 IMPLANT
ELECT COATED BLADE 2.86 ST (ELECTRODE) ×2 IMPLANT
ELECT REM PT RETURN 9FT ADLT (ELECTROSURGICAL) ×2
ELECTRODE REM PT RTRN 9FT ADLT (ELECTROSURGICAL) ×1 IMPLANT
GAUZE SPONGE 4X4 12PLY STRL LF (GAUZE/BANDAGES/DRESSINGS) ×2 IMPLANT
GLOVE SURG ENC MOIS LTX SZ7 (GLOVE) ×4 IMPLANT
GLOVE SURG UNDER POLY LF SZ7.5 (GLOVE) ×4 IMPLANT
GOWN STRL REUS W/ TWL LRG LVL3 (GOWN DISPOSABLE) ×2 IMPLANT
GOWN STRL REUS W/TWL LRG LVL3 (GOWN DISPOSABLE) ×4
IV KIT MINILOC 20X1 SAFETY (NEEDLE) IMPLANT
KIT CVR 48X5XPRB PLUP LF (MISCELLANEOUS) IMPLANT
KIT PORT POWER 8FR ISP CVUE (Port) ×2 IMPLANT
NDL SAFETY ECLIPSE 18X1.5 (NEEDLE) IMPLANT
NEEDLE HYPO 18GX1.5 SHARP (NEEDLE)
NEEDLE HYPO 25X1 1.5 SAFETY (NEEDLE) ×2 IMPLANT
PACK BASIN DAY SURGERY FS (CUSTOM PROCEDURE TRAY) ×2 IMPLANT
PENCIL SMOKE EVACUATOR (MISCELLANEOUS) ×2 IMPLANT
SLEEVE SCD COMPRESS KNEE MED (STOCKING) ×2 IMPLANT
STRIP CLOSURE SKIN 1/2X4 (GAUZE/BANDAGES/DRESSINGS) ×2 IMPLANT
SUT MNCRL AB 4-0 PS2 18 (SUTURE) ×2 IMPLANT
SUT PROLENE 2 0 SH DA (SUTURE) ×2 IMPLANT
SUT SILK 2 0 TIES 17X18 (SUTURE)
SUT SILK 2-0 18XBRD TIE BLK (SUTURE) IMPLANT
SUT VIC AB 3-0 SH 27 (SUTURE) ×2
SUT VIC AB 3-0 SH 27X BRD (SUTURE) ×1 IMPLANT
SYR 5ML LUER SLIP (SYRINGE) ×2 IMPLANT
SYR CONTROL 10ML LL (SYRINGE) ×2 IMPLANT
TOWEL GREEN STERILE FF (TOWEL DISPOSABLE) ×2 IMPLANT
TUBE CONNECTING 20X1/4 (TUBING) IMPLANT
YANKAUER SUCT BULB TIP NO VENT (SUCTIONS) IMPLANT

## 2021-08-24 NOTE — Interval H&P Note (Signed)
History and Physical Interval Note:  08/24/2021 7:23 AM Due for chemo tomorrow, plan for right ij port today as discussed Allison Reed  has presented today for surgery, with the diagnosis of BREAST CANCER.  The various methods of treatment have been discussed with the patient and family. After consideration of risks, benefits and other options for treatment, the patient has consented to  Procedure(s): INSERTION PORT-A-CATH (N/A) as a surgical intervention.  The patient's history has been reviewed, patient examined, no change in status, stable for surgery.  I have reviewed the patient's chart and labs.  Questions were answered to the patient's satisfaction.     Allison Reed

## 2021-08-24 NOTE — Progress Notes (Signed)
Patient Care Team: Adrian Prince, MD as PCP - General (Endocrinology) Pershing Proud, RN as Oncology Nurse Navigator Donnelly Angelica, RN as Oncology Nurse Navigator  DIAGNOSIS:    ICD-10-CM   1. Malignant neoplasm of overlapping sites of right breast in female, estrogen receptor positive (HCC)  C50.811 DISCONTINUED: dexamethasone (DECADRON) 4 mg in sodium chloride 0.9 % 50 mL IVPB   Z17.0       SUMMARY OF ONCOLOGIC HISTORY: Oncology History Overview Note  Ductal carcinoma in situ of the right breast  She palpated a lump in her right breast. Diagnostic mammogram and [place] on 06/08/21 showed irregular hypoechoic lesions in the right breast with a single abnormal right axillary lymph node without normal cortex. Biopsy on 06/16/21 showed high grade DCIS with calcifications and necrosis.   Malignant neoplasm of overlapping sites of right breast in female, estrogen receptor positive (HCC)  06/16/2021 Initial Diagnosis   Palpable right breast mass: Breast MRI revealed extensive involvement of the right breast with mass and non-mass enhancement superior right breast mass measures 4.9 x 1.2 cm and together with non-mass enhancement measured 8.8 cm, enhancing mass LOQ 1.3 cm, left breast indeterminate 0.9 cm mass and a 0.8 cm mass UOQ, single abnormal lymph node Biopsy 10:00, 11:00 and 12:00: IDC with DCIS, biopsy right axillary lymph node: IDC, ER 75 to 95%, PR 90 to 95%, Ki-67 25%, HER2 negative on 1 biopsy and 2+ by IHC and FISH pending   06/21/2021 Cancer Staging   Staging form: Breast, AJCC 8th Edition - Clinical stage from 06/21/2021: Stage IIA (cT3, cN1, cM0, G2, ER+, PR+, HER2-) - Signed by [name], MD on 06/21/2021 Histologic grading system: 3 grade system   07/01/2021 Genetic Testing   Negative genetic testing:  No pathogenic variants detected on the [facility] BRCAplus panel (report date 07/01/2021) or the [facility] CancerNext-Expanded + RNAinsight panel (report date 07/01/2021).   The  BRCAplus panel offered by [employer] and includes sequencing and deletion/duplication analysis for the following 8 genes: ATM, BRCA1, BRCA2, CDH1, CHEK2, PALB2, PTEN, and TP53. The CancerNext-Expanded + RNAinsight gene panel offered by [employer] and includes sequencing and rearrangement analysis for the following 77 genes: AIP, ALK, APC, ATM, AXIN2, BAP1, BARD1, BLM, BMPR1A, BRCA1, BRCA2, BRIP1, CDC73, CDH1, CDK4, CDKN1B, CDKN2A, CHEK2, CTNNA1, DICER1, FANCC, FH, FLCN, GALNT12, KIF1B, LZTR1, MAX, MEN1, MET, MLH1, MSH2, MSH3, MSH6, MUTYH, NBN, NF1, NF2, NTHL1, PALB2, PHOX2B, PMS2, POT1, PRKAR1A, PTCH1, PTEN, RAD51C, RAD51D, RB1, RECQL, RET, SDHA, SDHAF2, SDHB, SDHC, SDHD, SMAD4, SMARCA4, SMARCB1, SMARCE1, STK11, SUFU, TMEM127, TP53, TSC1, TSC2, VHL and XRCC2 (sequencing and deletion/duplication); EGFR, EGLN1, HOXB13, KIT, MITF, PDGFRA, POLD1 and POLE (sequencing only); EPCAM and GREM1 (deletion/duplication only). RNA data is routinely analyzed for use in variant interpretation for all genes.   07/25/2021 Surgery   Right mastectomy: Foci of invasive ductal carcinoma measuring 1.1 cm grade 2 with extensive DCIS spanning 8.9 cm, margins negative, 4/7 lymph nodes, ER 75 to 95%, PR 9095%, HER2 negative, Ki-67 25%   08/25/2021 -  Chemotherapy    Patient is on Treatment Plan: BREAST ADJUVANT DOSE DENSE AC Q14D / PACLITAXEL Q7D         CHIEF COMPLIANT: Cycle 1 dose dense Adriamycin and Cytoxan  INTERVAL HISTORY: JENEVIE CASSTEVENS is a 45 y.o. with above-mentioned history of invasive ductal carcinoma of the right breast having undergone right mastectomy, currently on chemotherapy with Taxol. She presents to the clinic today to start treatment.  She is complaining of profound  itching of the skin especially on her arms some on the neck and back as well.  This is been going on for about a week and no matter what she does the itching persists.  She has been taking Benadryl at bedtime.  She is applying cortisone  cream.  She has changed her diabetic medication recently.  ALLERGIES:  is allergic to betadine [povidone iodine] and tape.  MEDICATIONS:  Current Outpatient Medications  Medication Sig Dispense Refill   Empagliflozin-metFORMIN HCl ER (SYNJARDY XR) 09-999 MG TB24 Take by mouth.     lidocaine-prilocaine (EMLA) cream Apply to affected area once 30 g 3   LINAGLIPTIN PO Take by mouth.     ondansetron (ZOFRAN) 8 MG tablet Take 1 tablet (8 mg total) by mouth 2 (two) times daily as needed. Start on the third day after chemotherapy. 30 tablet 1   prochlorperazine (COMPAZINE) 10 MG tablet Take 1 tablet (10 mg total) by mouth every 6 (six) hours as needed (Nausea or vomiting). 30 tablet 1   sitaGLIPtin (JANUVIA) 100 MG tablet Take 100 mg by mouth daily.     tamoxifen (NOLVADEX) 20 MG tablet TAKE 1 TABLET BY MOUTH EVERY DAY 90 tablet 0   No current facility-administered medications for this visit.   Facility-Administered Medications Ordered in Other Visits  Medication Dose Route Frequency Provider Last Rate Last Admin   cyclophosphamide (CYTOXAN) 1,080 mg in sodium chloride 0.9 % 250 mL chemo infusion  600 mg/m2 (Treatment Plan Recorded) Intravenous Once [name], MD       DOXOrubicin (ADRIAMYCIN) chemo injection 108 mg  60 mg/m2 (Treatment Plan Recorded) Intravenous Once [name], MD       fosaprepitant (EMEND) 150 mg in sodium chloride 0.9 % 145 mL IVPB  150 mg Intravenous Once [name], MD       heparin lock flush 100 unit/mL  500 Units Intracatheter Once PRN [name], MD       sodium chloride flush (NS) 0.9 % injection 10 mL  10 mL Intracatheter PRN [name], MD        PHYSICAL EXAMINATION: ECOG PERFORMANCE STATUS: 1 - Symptomatic but completely ambulatory  Vitals:   08/25/21 1152  BP: 116/72  Pulse: 90  Resp: 18  Temp: 97.7 F (36.5 C)  SpO2: 98%   Filed Weights   08/25/21 1152  Weight: 153 lb 14.4 oz (69.8 kg)    LABORATORY DATA:  I have reviewed the  data as listed CMP Latest Ref Rng & Units 08/25/2021 08/22/2021 07/21/2021  Glucose 70 - 99 mg/dL 233(H) 154(H) 123(H)  BUN 6 - 20 mg/dL $Remove'8 6 8  'KPrzlZZ$ Creatinine 0.44 - 1.00 mg/dL 0.68 0.53 0.57  Sodium 135 - 145 mmol/L 140 134(L) 136  Potassium 3.5 - 5.1 mmol/L 3.6 4.2 4.1  Chloride 98 - 111 mmol/L 104 100 104  CO2 22 - 32 mmol/L $RemoveB'22 25 23  'jrHGPmmW$ Calcium 8.9 - 10.3 mg/dL 9.0 8.9 9.2  Total Protein 6.5 - 8.1 g/dL 6.8 - -  Total Bilirubin 0.3 - 1.2 mg/dL 0.2(L) - -  Alkaline Phos 38 - 126 U/L 59 - -  AST 15 - 41 U/L 14(L) - -  ALT 0 - 44 U/L 16 - -    Lab Results  Component Value Date   WBC 7.0 08/25/2021   HGB 13.4 08/25/2021   HCT 40.7 08/25/2021   MCV 80.9 08/25/2021   PLT 215 08/25/2021   NEUTROABS 3.3 08/25/2021    ASSESSMENT & PLAN:  Malignant  neoplasm of overlapping sites of right breast in female, estrogen receptor positive ([place]) 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, margins negative, 4/7 lymph nodes, ER 75 to 95%, PR 9095%, HER2 negative, Ki-67 25%   Pathology counseling: I discussed the final pathology report of the patient provided  a copy of this report. I discussed the margins.  We also discussed the final staging along with previously performed ER/PR testing.   Genetics: Negative MammaPrint: Low risk (this is not a valid test for 4+ lymph nodes)   Treatment plan: 1.  Adjuvant chemotherapy with dose dense Adriamycin and Cytoxan x4 followed by Taxol weekly x12 2. adjuvant radiation therapy 3.  Followed by adjuvant antiestrogen therapy with complete estrogen blockade and abemaciclib ------------------------------------------------------------------------------------------------------------------------ Current treatment: Cycle 1 day 1 dose dense Adriamycin and Cytoxan 08/18/2021: Echocardiogram: EF 55 to 60%  labs were reviewed, chemo consent obtained, chemo education completed  Profound skin  itching: We will add 4 mg of Decadron (even though she is diabetic) to her chemoinfusion to see if that makes a difference.  Return to clinic in 1 week for toxicity check    No orders of the defined types were placed in this encounter.  The patient has a good understanding of the overall plan. she agrees with it. she will call with any problems that may develop before the next visit here.  Total time spent: 30 mins including face to face time and time spent for planning, charting and coordination of care  Rulon Eisenmenger, MD, MPH 08/25/2021  I, Thana Ates, am acting as scribe for Dr. [name].  I have reviewed the above documentation for accuracy and completeness, and I agree with the above.

## 2021-08-24 NOTE — Anesthesia Procedure Notes (Signed)
Procedure Name: LMA Insertion Date/Time: 08/24/2021 7:29 AM Performed by: Willa Frater, CRNA Pre-anesthesia Checklist: Patient identified, Emergency Drugs available, Suction available and Patient being monitored Patient Re-evaluated:Patient Re-evaluated prior to induction Oxygen Delivery Method: Circle system utilized Preoxygenation: Pre-oxygenation with 100% oxygen Induction Type: IV induction Ventilation: Mask ventilation without difficulty LMA: LMA inserted LMA Size: 4.0 and 3.0 Number of attempts: 1 Airway Equipment and Method: Bite block Placement Confirmation: positive ETCO2 Tube secured with: Tape Dental Injury: Teeth and Oropharynx as per pre-operative assessment

## 2021-08-24 NOTE — Anesthesia Preprocedure Evaluation (Addendum)
Anesthesia Evaluation  Patient identified by MRN, date of birth, ID band Patient awake    Reviewed: Allergy & Precautions, NPO status , Patient's Chart, lab work & pertinent test results  Airway Mallampati: II  TM Distance: >3 FB Neck ROM: Full    Dental no notable dental hx. (+) Teeth Intact, Dental Advisory Given   Pulmonary neg pulmonary ROS,    Pulmonary exam normal breath sounds clear to auscultation       Cardiovascular Normal cardiovascular exam Rhythm:Regular Rate:Normal  Echo 08/18/21:  1. Left ventricular ejection fraction, by estimation, is 55 to 60%. The  left ventricle has normal function. The left ventricle has no regional  wall motion abnormalities. Left ventricular diastolic parameters were  normal. The average left ventricular  global longitudinal strain is -22.7 %. The global longitudinal strain is  normal.  2. Right ventricular systolic function is normal. The right ventricular  size is normal.  3. The mitral valve is normal in structure. Trivial mitral valve  regurgitation. No evidence of mitral stenosis.  4. The aortic valve is normal in structure. Aortic valve regurgitation is  not visualized. No aortic stenosis is present.  5. The inferior vena cava is normal in size with greater than 50%  respiratory variability, suggesting right atrial pressure of 3 mmHg.    Neuro/Psych  Headaches, negative psych ROS   GI/Hepatic negative GI ROS, Neg liver ROS,   Endo/Other  diabetes, Well Controlled, Type 2, Oral Hypoglycemic Agents  Renal/GU negative Renal ROS  negative genitourinary   Musculoskeletal negative musculoskeletal ROS (+)   Abdominal   Peds  Hematology negative hematology ROS (+)   Anesthesia Other Findings Breast ca   Reproductive/Obstetrics negative OB ROS                            Anesthesia Physical Anesthesia Plan  ASA: 2  Anesthesia Plan: General    Post-op Pain Management:    Induction: Intravenous  PONV Risk Score and Plan: 3 and Ondansetron, Dexamethasone, Midazolam and Treatment may vary due to age or medical condition  Airway Management Planned: LMA  Additional Equipment: None  Intra-op Plan:   Post-operative Plan: Extubation in OR  Informed Consent: I have reviewed the patients History and Physical, chart, labs and discussed the procedure including the risks, benefits and alternatives for the proposed anesthesia with the patient or authorized representative who has indicated his/her understanding and acceptance.     Dental advisory given  Plan Discussed with: CRNA  Anesthesia Plan Comments:         Anesthesia Quick Evaluation

## 2021-08-24 NOTE — Transfer of Care (Signed)
Immediate Anesthesia Transfer of Care Note  Patient: Allison Reed  Procedure(s) Performed: INSERTION PORT-A-CATH (Breast)  Patient Location: PACU  Anesthesia Type:General  Level of Consciousness: sedated  Airway & Oxygen Therapy: Patient Spontanous Breathing and Patient connected to face mask oxygen  Post-op Assessment: Report given to RN and Post -op Vital signs reviewed and stable  Post vital signs: Reviewed and stable  Last Vitals:  Vitals Value Taken Time  BP    Temp    Pulse    Resp    SpO2      Last Pain:  Vitals:   08/24/21 0644  TempSrc: Oral  PainSc: 0-No pain         Complications: No notable events documented.

## 2021-08-24 NOTE — Op Note (Signed)
Preoperative diagnosis: Right breast cancer, needs venous access for chemotherapy Postoperative diagnosis: Same as above Procedure:  Right IJ  port placement Surgeon: Dr. Serita Grammes Anesthesia: General  Estimated blood loss: minimal Specimens:none Sponge and needle count was correct at completion Drains: None Disposition recovery stable condition   Indications:  55 yof s/p right mastectomy and TAD for breast cancer.  Needs chemotherapy and port for venous access.     Procedure: After informed consent was obtained she was taken to the OR. She was given antibiotics.  SCDs were placed.  She was placed under general anesthesia without complication.  She was prepped and draped in the standard sterile surgical fashion.  A surgical timeout was then performed.   I used the ultrasound to identify the right IJ. I then accessed this on the first pass.  I placed the wire and this was in good position by fluoroscopy and in the vein by ultrasound.   I then made an incision on her right chest and created a pocket.  I tunneled the line between the 2 sites.  I then placed the dilator over the wire.  I observed this with fluoroscopy to go in the correct position.  I then removed the wire.  I then passed the line.  The peel-away sheath was removed.  I pulled the line back to be in the superior vena cava. She was tachycardic with line in atrium and I pulled this back to right where the tachycardia stopped. The tip of the line is in the superior vena cava near the cavoatrial junction. I then attached the port.  I sutured this into place with 2-0 Prolene.  I then closed this with 3-0 Vicryl and 4-0 Monocryl.  Glue was placed.  Final fluoroscopic image showed the port to be in good position.  I then accessed the port and was able to aspirate blood and packed this with heparin. I placed a dressing and left accessed to begin chemotherapy.  She tolerated well, was transferred to recovery stable.

## 2021-08-24 NOTE — Discharge Instructions (Addendum)
PORT-A-CATH: POST OP INSTRUCTIONS  Always review your discharge instruction sheet given to you by the facility where your surgery was performed.   A prescription for pain medication may be given to you upon discharge. Take your pain medication as prescribed, if needed. If narcotic pain medicine is not needed, then you make take acetaminophen (Tylenol) or ibuprofen (Advil) as needed. NO TYLENOL UNTIL AFTER 1:00PM TODAY. Take your usually prescribed medications unless otherwise directed. If you need a refill on your pain medication, please contact our office. All narcotic pain medicine now requires a paper prescription.  Phoned in and fax refills are no longer allowed by law.  Prescriptions will not be filled after 5 pm or on weekends.  You should follow a light diet for the remainder of the day after your procedure. Most patients will experience some mild swelling and/or bruising in the area of the incision. It may take several days to resolve. It is common to experience some constipation if taking pain medication after surgery. Increasing fluid intake and taking a stool softener (such as Colace) will usually help or prevent this problem from occurring. A mild laxative (Milk of Magnesia or Miralax) should be taken according to package directions if there are no bowel movements after 48 hours.  Unless discharge instructions indicate otherwise, you may remove your bandages 48 hours after surgery, and you may shower at that time. You may have steri-strips (small white skin tapes) in place directly over the incision.  These strips should be left on the skin for 7-10 days.  If your surgeon used Dermabond (skin glue) on the incision, you may shower in 24 hours.  The glue will flake off over the next 2-3 weeks.  If your port is left accessed at the end of surgery (needle left in port), the dressing cannot get wet and should only by changed by a healthcare professional. When the port is no longer accessed  (when the needle has been removed), follow step 7.   ACTIVITIES:  Limit activity involving your arms for the next 72 hours. Do no strenuous exercise or activity for 1 week. You may drive when you are no longer taking prescription pain medication, you can comfortably wear a seatbelt, and you can maneuver your car. 10.You may need to see your doctor in the office for a follow-up appointment.  Please       check with your doctor.  11.When you receive a new Port-a-Cath, you will get a product guide and        ID card.  Please keep them in case you need them.  WHEN TO CALL YOUR DOCTOR [phone]): Fever over 101.0 Chills Continued bleeding from incision Increased redness and tenderness at the site Shortness of breath, difficulty breathing   The clinic staff is available to answer your questions during regular business hours. Please don't hesitate to call and ask to speak to one of the nurses or medical assistants for clinical concerns. If you have a medical emergency, go to the nearest emergency room or call 911.  A surgeon from [facility] is always on call at the hospital.     For further information, please visit [email or web address]     Post Anesthesia Home Care Instructions  Activity: Get plenty of rest for the remainder of the day. A responsible individual must stay with you for 24 hours following the procedure.  For the next 24 hours, DO NOT: -Drive a car -Paediatric nurse -Drink alcoholic beverages -Take any  medication unless instructed by your physician -Make any legal decisions or sign important papers.  Meals: Start with liquid foods such as gelatin or soup. Progress to regular foods as tolerated. Avoid greasy, spicy, heavy foods. If nausea and/or vomiting occur, drink only clear liquids until the nausea and/or vomiting subsides. Call your physician if vomiting continues.  Special Instructions/Symptoms: Your throat may feel dry or sore from the  anesthesia or the breathing tube placed in your throat during surgery. If this causes discomfort, gargle with warm salt water. The discomfort should disappear within 24 hours.  If you had a scopolamine patch placed behind your ear for the management of post- operative nausea and/or vomiting:  1. The medication in the patch is effective for 72 hours, after which it should be removed.  Wrap patch in a tissue and discard in the trash. Wash hands thoroughly with soap and water. 2. You may remove the patch earlier than 72 hours if you experience unpleasant side effects which may include dry mouth, dizziness or visual disturbances. 3. Avoid touching the patch. Wash your hands with soap and water after contact with the patch.

## 2021-08-24 NOTE — Anesthesia Postprocedure Evaluation (Signed)
Anesthesia Post Note  Patient: Allison Reed  Procedure(s) Performed: INSERTION PORT-A-CATH (Breast)     Patient location during evaluation: PACU Anesthesia Type: General Level of consciousness: awake and alert, oriented and patient cooperative Pain management: pain level controlled Vital Signs Assessment: post-procedure vital signs reviewed and stable Respiratory status: spontaneous breathing, nonlabored ventilation and respiratory function stable Cardiovascular status: blood pressure returned to baseline and stable Postop Assessment: no apparent nausea or vomiting Anesthetic complications: no   No notable events documented.  Last Vitals:  Vitals:   08/24/21 0845 08/24/21 0900  BP: 125/83 121/79  Pulse: 93 83  Resp: 12 13  Temp:    SpO2: 100% 98%    Last Pain:  Vitals:   08/24/21 0845  TempSrc:   PainSc: 0-No pain                 Pervis Hocking

## 2021-08-25 ENCOUNTER — Inpatient Hospital Stay: Payer: 59

## 2021-08-25 ENCOUNTER — Inpatient Hospital Stay: Payer: 59 | Admitting: Hematology and Oncology

## 2021-08-25 ENCOUNTER — Encounter: Payer: Self-pay | Admitting: *Deleted

## 2021-08-25 ENCOUNTER — Encounter: Payer: Self-pay | Admitting: Hematology and Oncology

## 2021-08-25 ENCOUNTER — Encounter (HOSPITAL_BASED_OUTPATIENT_CLINIC_OR_DEPARTMENT_OTHER): Payer: Self-pay | Admitting: General Surgery

## 2021-08-25 DIAGNOSIS — Z170 Estrogen receptor positive status [ER+]: Secondary | ICD-10-CM | POA: Diagnosis not present

## 2021-08-25 DIAGNOSIS — C50811 Malignant neoplasm of overlapping sites of right female breast: Secondary | ICD-10-CM

## 2021-08-25 DIAGNOSIS — Z7984 Long term (current) use of oral hypoglycemic drugs: Secondary | ICD-10-CM | POA: Diagnosis not present

## 2021-08-25 DIAGNOSIS — J069 Acute upper respiratory infection, unspecified: Secondary | ICD-10-CM | POA: Diagnosis not present

## 2021-08-25 DIAGNOSIS — Z95828 Presence of other vascular implants and grafts: Secondary | ICD-10-CM

## 2021-08-25 DIAGNOSIS — R000 Tachycardia, unspecified: Secondary | ICD-10-CM | POA: Diagnosis not present

## 2021-08-25 DIAGNOSIS — K219 Gastro-esophageal reflux disease without esophagitis: Secondary | ICD-10-CM | POA: Diagnosis not present

## 2021-08-25 DIAGNOSIS — E119 Type 2 diabetes mellitus without complications: Secondary | ICD-10-CM | POA: Diagnosis not present

## 2021-08-25 DIAGNOSIS — Z5111 Encounter for antineoplastic chemotherapy: Secondary | ICD-10-CM | POA: Diagnosis not present

## 2021-08-25 DIAGNOSIS — Z9011 Acquired absence of right breast and nipple: Secondary | ICD-10-CM | POA: Diagnosis not present

## 2021-08-25 DIAGNOSIS — Z5189 Encounter for other specified aftercare: Secondary | ICD-10-CM | POA: Diagnosis not present

## 2021-08-25 DIAGNOSIS — L299 Pruritus, unspecified: Secondary | ICD-10-CM | POA: Diagnosis not present

## 2021-08-25 DIAGNOSIS — Z79899 Other long term (current) drug therapy: Secondary | ICD-10-CM | POA: Diagnosis not present

## 2021-08-25 LAB — CBC WITH DIFFERENTIAL (CANCER CENTER ONLY)
Abs Immature Granulocytes: 0.02 10*3/uL (ref 0.00–0.07)
Basophils Absolute: 0 10*3/uL (ref 0.0–0.1)
Basophils Relative: 0 %
Eosinophils Absolute: 0.3 10*3/uL (ref 0.0–0.5)
Eosinophils Relative: 4 %
HCT: 40.7 % (ref 36.0–46.0)
Hemoglobin: 13.4 g/dL (ref 12.0–15.0)
Immature Granulocytes: 0 %
Lymphocytes Relative: 42 %
Lymphs Abs: 3 10*3/uL (ref 0.7–4.0)
MCH: 26.6 pg (ref 26.0–34.0)
MCHC: 32.9 g/dL (ref 30.0–36.0)
MCV: 80.9 fL (ref 80.0–100.0)
Monocytes Absolute: 0.5 10*3/uL (ref 0.1–1.0)
Monocytes Relative: 7 %
Neutro Abs: 3.3 10*3/uL (ref 1.7–7.7)
Neutrophils Relative %: 47 %
Platelet Count: 215 10*3/uL (ref 150–400)
RBC: 5.03 MIL/uL (ref 3.87–5.11)
RDW: 13.2 % (ref 11.5–15.5)
WBC Count: 7 10*3/uL (ref 4.0–10.5)
nRBC: 0 % (ref 0.0–0.2)

## 2021-08-25 LAB — CMP (CANCER CENTER ONLY)
ALT: 16 U/L (ref 0–44)
AST: 14 U/L — ABNORMAL LOW (ref 15–41)
Albumin: 3.9 g/dL (ref 3.5–5.0)
Alkaline Phosphatase: 59 U/L (ref 38–126)
Anion gap: 14 (ref 5–15)
BUN: 8 mg/dL (ref 6–20)
CO2: 22 mmol/L (ref 22–32)
Calcium: 9 mg/dL (ref 8.9–10.3)
Chloride: 104 mmol/L (ref 98–111)
Creatinine: 0.68 mg/dL (ref 0.44–1.00)
GFR, Estimated: >60 mL/min (ref >60)
Glucose, Bld: 233 mg/dL — ABNORMAL HIGH (ref 70–99)
Potassium: 3.6 mmol/L (ref 3.5–5.1)
Sodium: 140 mmol/L (ref 135–145)
Total Bilirubin: 0.2 mg/dL — ABNORMAL LOW (ref 0.3–1.2)
Total Protein: 6.8 g/dL (ref 6.5–8.1)

## 2021-08-25 MED ORDER — HEPARIN SOD (PORK) LOCK FLUSH 100 UNIT/ML IV SOLN
500.0000 [IU] | Freq: Once | INTRAVENOUS | Status: AC | PRN
  Administered 2021-08-25: 500 [IU]

## 2021-08-25 MED ORDER — SODIUM CHLORIDE 0.9 % IV SOLN: 4.0000 mg | Freq: Once | INTRAVENOUS | Status: DC

## 2021-08-25 MED ORDER — SODIUM CHLORIDE 0.9% FLUSH
10.0000 mL | INTRAVENOUS | Status: DC | PRN
Start: 2021-08-25 — End: 2021-08-25
  Administered 2021-08-25: 10 mL

## 2021-08-25 MED ORDER — SODIUM CHLORIDE 0.9% FLUSH
10.0000 mL | Freq: Once | INTRAVENOUS | Status: AC
  Administered 2021-08-25: 10 mL

## 2021-08-25 MED ORDER — DOXORUBICIN HCL CHEMO IV INJECTION 2 MG/ML
60.0000 mg/m2 | Freq: Once | INTRAVENOUS | Status: AC
  Administered 2021-08-25: 108 mg via INTRAVENOUS
  Filled 2021-08-25: qty 54

## 2021-08-25 MED ORDER — DEXAMETHASONE SODIUM PHOSPHATE 10 MG/ML IJ SOLN
4.0000 mg | Freq: Once | INTRAMUSCULAR | Status: AC
  Administered 2021-08-25: 4 mg via INTRAVENOUS
  Filled 2021-08-25: qty 1

## 2021-08-25 MED ORDER — SODIUM CHLORIDE 0.9 % IV SOLN
600.0000 mg/m2 | Freq: Once | INTRAVENOUS | Status: AC
  Administered 2021-08-25: 1080 mg via INTRAVENOUS
  Filled 2021-08-25: qty 54

## 2021-08-25 MED ORDER — SODIUM CHLORIDE 0.9 % IV SOLN: Freq: Once | INTRAVENOUS | Status: AC

## 2021-08-25 MED ORDER — SODIUM CHLORIDE 0.9 % IV SOLN
150.0000 mg | Freq: Once | INTRAVENOUS | Status: AC
  Administered 2021-08-25: 150 mg via INTRAVENOUS
  Filled 2021-08-25: qty 150

## 2021-08-25 MED ORDER — PALONOSETRON HCL INJECTION 0.25 MG/5ML
0.2500 mg | Freq: Once | INTRAVENOUS | Status: AC
  Administered 2021-08-25: 0.25 mg via INTRAVENOUS
  Filled 2021-08-25: qty 5

## 2021-08-25 NOTE — Progress Notes (Signed)
Left message stating courtesy call and if any questions or concerns please call the doctors office.  

## 2021-08-25 NOTE — Assessment & Plan Note (Signed)
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 I discussed the margins.  We also discussed the final staging along with previously performed ER/PR testing.  Genetics: Negative MammaPrint: Low risk (this is not a valid test for 4+ lymph nodes)  Treatment plan: 1.  Adjuvant chemotherapy with dose dense Adriamycin and Cytoxan x4 followed by Taxol weekly x12 2. adjuvant radiation therapy 3.  Followed by adjuvant antiestrogen therapy with complete estrogen blockade and abemaciclib ------------------------------------------------------------------------------------------------------------------------ Current treatment: Cycle 1 day 1 dose dense Adriamycin and Cytoxan 08/18/2021: Echocardiogram: EF 55 to 60% Antibiotics were reviewed, labs were reviewed, chemo consent obtained, chemo education completed Return to clinic in 1 week for toxicity check

## 2021-08-25 NOTE — Patient Instructions (Addendum)
[facility] ONCOL[facility]   Discharge Instructions: Thank you for choosing [facility] to provide your oncology and hematology care.   If you have a lab appointment with the [facility], please go directly to the [facility] and check in at the registration area.   Wear comfortable clothing and clothing appropriate for easy access to any Portacath or PICC line.   We strive to give you quality time with your provider. You may need to reschedule your appointment if you arrive late (15 or more minutes).  Arriving late affects you and other patients whose appointments are after yours.  Also, if you miss three or more appointments without notifying the office, you may be dismissed from the clinic at the provider's discretion.      For prescription refill requests, have your pharmacy contact our office and allow 72 hours for refills to be completed.    Today you received the following chemotherapy and/or immunotherapy agents: Doxorubicin (Adriamycin) and Cyclophosphamide (Cytoxan).      To help prevent nausea and vomiting after your treatment, we encourage you to take your nausea medication as directed.  BELOW ARE SYMPTOMS THAT SHOULD BE REPORTED IMMEDIATELY: *FEVER GREATER THAN 100.4 F (38 C) OR HIGHER *CHILLS OR SWEATING *NAUSEA AND VOMITING THAT IS NOT CONTROLLED WITH YOUR NAUSEA MEDICATION *UNUSUAL SHORTNESS OF BREATH *UNUSUAL BRUISING OR BLEEDING *URINARY PROBLEMS (pain or burning when urinating, or frequent urination) *BOWEL PROBLEMS (unusual diarrhea, constipation, pain near the anus) TENDERNESS IN MOUTH AND THROAT WITH OR WITHOUT PRESENCE OF ULCERS (sore throat, sores in mouth, or a toothache) UNUSUAL RASH, SWELLING OR PAIN  UNUSUAL VAGINAL DISCHARGE OR ITCHING   Items with * indicate a potential emergency and should be followed up as soon as possible or go to the Emergency Department if any problems should occur.  Please show the CHEMOTHERAPY ALERT  CARD or IMMUNOTHERAPY ALERT CARD at check-in to the Emergency Department and triage nurse.  Should you have questions after your visit or need to cancel or reschedule your appointment, please contact [facility]  Dept: [phone]  and follow the prompts.  Office hours are 8:00 a.m. to 4:30 p.m. Monday - Friday. Please note that voicemails left after 4:00 p.m. may not be returned until the following business day.  We are closed weekends and major holidays. You have access to a nurse at all times for urgent questions. Please call the main number to the clinic Dept: [phone] and follow the prompts.   For any non-urgent questions, you may also contact your provider using MyChart. We now offer e-Visits for anyone 66 and older to request care online for non-urgent symptoms. For details visit [email or web address].   Also download the MyChart app! Go to the app store, search "MyChart", open the app, select [facility], and log in with your MyChart username and password.  Due to Covid, a mask is required upon entering the hospital/clinic. If you do not have a mask, one will be given to you upon arrival. For doctor visits, patients may have 1 support person aged 34 or older with them. For treatment visits, patients cannot have anyone with them due to current Covid guidelines and our immunocompromised population.

## 2021-08-26 ENCOUNTER — Ambulatory Visit: Payer: 59

## 2021-08-28 ENCOUNTER — Inpatient Hospital Stay: Payer: 59

## 2021-08-28 ENCOUNTER — Other Ambulatory Visit: Payer: Self-pay

## 2021-08-28 VITALS — BP 100/76 | HR 88 | Temp 98.6°F | Resp 18

## 2021-08-28 DIAGNOSIS — Z5111 Encounter for antineoplastic chemotherapy: Secondary | ICD-10-CM | POA: Diagnosis not present

## 2021-08-28 DIAGNOSIS — Z170 Estrogen receptor positive status [ER+]: Secondary | ICD-10-CM

## 2021-08-28 DIAGNOSIS — C50811 Malignant neoplasm of overlapping sites of right female breast: Secondary | ICD-10-CM

## 2021-08-28 MED ORDER — PEGFILGRASTIM-BMEZ 6 MG/0.6ML ~~LOC~~ SOSY
6.0000 mg | PREFILLED_SYRINGE | Freq: Once | SUBCUTANEOUS | Status: AC
Start: 2021-08-28 — End: 2021-08-28
  Administered 2021-08-28: 6 mg via SUBCUTANEOUS
  Filled 2021-08-28: qty 0.6

## 2021-08-31 ENCOUNTER — Other Ambulatory Visit: Payer: Self-pay | Admitting: Hematology and Oncology

## 2021-08-31 ENCOUNTER — Encounter: Payer: Self-pay | Admitting: Hematology and Oncology

## 2021-08-31 MED ORDER — LEVOFLOXACIN 500 MG PO TABS: 500.0000 mg | ORAL_TABLET | Freq: Every day | ORAL | 0 refills | Status: DC

## 2021-08-31 NOTE — Progress Notes (Signed)
Patient Care Team: Adrian Prince, MD as PCP - General (Endocrinology) Pershing Proud, RN as Oncology Nurse Navigator Donnelly Angelica, RN as Oncology Nurse Navigator  DIAGNOSIS:    ICD-10-CM   1. Malignant neoplasm of overlapping sites of right breast in female, estrogen receptor positive (HCC)  C50.811    Z17.0       SUMMARY OF ONCOLOGIC HISTORY: Oncology History Overview Note  Ductal carcinoma in situ of the right breast  She palpated a lump in her right breast. Diagnostic mammogram and [place] on 06/08/21 showed irregular hypoechoic lesions in the right breast with a single abnormal right axillary lymph node without normal cortex. Biopsy on 06/16/21 showed high grade DCIS with calcifications and necrosis.   Malignant neoplasm of overlapping sites of right breast in female, estrogen receptor positive (HCC)  06/16/2021 Initial Diagnosis   Palpable right breast mass: Breast MRI revealed extensive involvement of the right breast with mass and non-mass enhancement superior right breast mass measures 4.9 x 1.2 cm and together with non-mass enhancement measured 8.8 cm, enhancing mass LOQ 1.3 cm, left breast indeterminate 0.9 cm mass and a 0.8 cm mass UOQ, single abnormal lymph node Biopsy 10:00, 11:00 and 12:00: IDC with DCIS, biopsy right axillary lymph node: IDC, ER 75 to 95%, PR 90 to 95%, Ki-67 25%, HER2 negative on 1 biopsy and 2+ by IHC and FISH pending   06/21/2021 Cancer Staging   Staging form: Breast, AJCC 8th Edition - Clinical stage from 06/21/2021: Stage IIA (cT3, cN1, cM0, G2, ER+, PR+, HER2-) - Signed by [name], MD on 06/21/2021 Histologic grading system: 3 grade system   07/01/2021 Genetic Testing   Negative genetic testing:  No pathogenic variants detected on the [employer] BRCAplus panel (report date 07/01/2021) or the [employer] CancerNext-Expanded + RNAinsight panel (report date 07/01/2021).   The BRCAplus panel offered by [employer] and includes sequencing and  deletion/duplication analysis for the following 8 genes: ATM, BRCA1, BRCA2, CDH1, CHEK2, PALB2, PTEN, and TP53. The CancerNext-Expanded + RNAinsight gene panel offered by [employer] and includes sequencing and rearrangement analysis for the following 77 genes: AIP, ALK, APC, ATM, AXIN2, BAP1, BARD1, BLM, BMPR1A, BRCA1, BRCA2, BRIP1, CDC73, CDH1, CDK4, CDKN1B, CDKN2A, CHEK2, CTNNA1, DICER1, FANCC, FH, FLCN, GALNT12, KIF1B, LZTR1, MAX, MEN1, MET, MLH1, MSH2, MSH3, MSH6, MUTYH, NBN, NF1, NF2, NTHL1, PALB2, PHOX2B, PMS2, POT1, PRKAR1A, PTCH1, PTEN, RAD51C, RAD51D, RB1, RECQL, RET, SDHA, SDHAF2, SDHB, SDHC, SDHD, SMAD4, SMARCA4, SMARCB1, SMARCE1, STK11, SUFU, TMEM127, TP53, TSC1, TSC2, VHL and XRCC2 (sequencing and deletion/duplication); EGFR, EGLN1, HOXB13, KIT, MITF, PDGFRA, POLD1 and POLE (sequencing only); EPCAM and GREM1 (deletion/duplication only). RNA data is routinely analyzed for use in variant interpretation for all genes.   07/25/2021 Surgery   Right mastectomy: Foci of invasive ductal carcinoma measuring 1.1 cm grade 2 with extensive DCIS spanning 8.9 cm, margins negative, 4/7 lymph nodes, ER 75 to 95%, PR 9095%, HER2 negative, Ki-67 25%   08/25/2021 -  Chemotherapy    Patient is on Treatment Plan: BREAST ADJUVANT DOSE DENSE AC Q14D / PACLITAXEL Q7D         CHIEF COMPLIANT: Cycle 2 dose dense Adriamycin and Cytoxan  INTERVAL HISTORY: Allison Reed is a 45 y.o. with above-mentioned history of invasive ductal carcinoma of the right breast having undergone right mastectomy, currently on chemotherapy with dose dense Adriamycin and Cytoxan. She presents to the clinic today to start treatment.  After the chemotherapy she felt extremely fatigued for 3 to 4 days.  She started all been upper respiratory infection which led to cold symptoms and cough and congestion.  She started having significant cough with copious production of yellow to green sputum.  She also had diarrhea for couple of days.  She  did not have any nausea or vomiting.  The itching sensation that she had when she started treatment is completely gone.  She did not have any temperature although she felt warm to touch.  ALLERGIES:  is allergic to betadine [povidone iodine] and tape.  MEDICATIONS:  Current Outpatient Medications  Medication Sig Dispense Refill   Empagliflozin-metFORMIN HCl ER (SYNJARDY XR) 09-999 MG TB24 Take by mouth.     levofloxacin (LEVAQUIN) 500 MG tablet Take 1 tablet (500 mg total) by mouth daily. 7 tablet 0   lidocaine-prilocaine (EMLA) cream Apply to affected area once 30 g 3   LINAGLIPTIN PO Take by mouth.     ondansetron (ZOFRAN) 8 MG tablet Take 1 tablet (8 mg total) by mouth 2 (two) times daily as needed. Start on the third day after chemotherapy. 30 tablet 1   prochlorperazine (COMPAZINE) 10 MG tablet Take 1 tablet (10 mg total) by mouth every 6 (six) hours as needed (Nausea or vomiting). 30 tablet 1   sitaGLIPtin (JANUVIA) 100 MG tablet Take 100 mg by mouth daily.     tamoxifen (NOLVADEX) 20 MG tablet TAKE 1 TABLET BY MOUTH EVERY DAY 90 tablet 0   No current facility-administered medications for this visit.    PHYSICAL EXAMINATION: ECOG PERFORMANCE STATUS: 1 - Symptomatic but completely ambulatory  Vitals:   09/01/21 1142  BP: 113/84  Pulse: (!) 115  Resp: 18  Temp: 97.9 F (36.6 C)  SpO2: 99%   Filed Weights   09/01/21 1142  Weight: 148 lb 12.8 oz (67.5 kg)    LABORATORY DATA:  I have reviewed the data as listed CMP Latest Ref Rng & Units 09/01/2021 08/25/2021 08/22/2021  Glucose 70 - 99 mg/dL 118(H) 233(H) 154(H)  BUN 6 - 20 mg/dL $Remove'8 8 6  'IYFzGJm$ Creatinine 0.44 - 1.00 mg/dL 0.62 0.68 0.53  Sodium 135 - 145 mmol/L 136 140 134(L)  Potassium 3.5 - 5.1 mmol/L 3.7 3.6 4.2  Chloride 98 - 111 mmol/L 101 104 100  CO2 22 - 32 mmol/L $RemoveB'23 22 25  'DvvmVtHw$ Calcium 8.9 - 10.3 mg/dL 10.0 9.0 8.9  Total Protein 6.5 - 8.1 g/dL 7.4 6.8 -  Total Bilirubin 0.3 - 1.2 mg/dL 0.7 0.2(L) -  Alkaline Phos 38 -  126 U/L 84 59 -  AST 15 - 41 U/L 14(L) 14(L) -  ALT 0 - 44 U/L 19 16 -    Lab Results  Component Value Date   WBC 6.6 09/01/2021   HGB 13.9 09/01/2021   HCT 41.6 09/01/2021   MCV 78.8 (L) 09/01/2021   PLT 223 09/01/2021   NEUTROABS 3.6 09/01/2021    ASSESSMENT & PLAN:  Malignant neoplasm of overlapping sites of right breast in female, estrogen receptor positive ([facility]) 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  Adjuvant chemotherapy with dose dense Adriamycin and Cytoxan x4 followed by Taxol weekly x12 started 08/25/2021 2. adjuvant radiation therapy 3.  Followed by adjuvant antiestrogen therapy with complete estrogen blockade and abemaciclib ------------------------------------------------------------------------------------------------------------------------  Current treatment: Cycle 1 day 8 dose dense Adriamycin and Cytoxan 08/18/2021: Echocardiogram: EF 55 to 60%  Chemo toxicities: 1.  Severe fatigue 2. severe upper respiratory infection/bronchitis/sinus congestion: Being treated with antibiotics with Levaquin.  Today her [place] is 3.6 and therefore she is not neutropenic and hence she can continue and finish up her 1 week of Levaquin.  She is already starting to feel better. Skin itching: Improved  Diabetes: Appears to be under reasonable control. Sore throat and earaches from chemo as well as burning with urination  Tachycardia: Most likely related to the infection.  She has been keeping her hydration up. Return to clinic in 1 week for cycle 2.  No orders of the defined types were placed in this encounter.  The patient has a good understanding of the overall plan. she agrees with it. she will call with any  problems that may develop before the next visit here.  Total time spent: 30 mins including face to face time and time spent for planning, charting and coordination of care  Rulon Eisenmenger, MD, MPH 09/01/2021  I, Thana Ates, am acting as scribe for Dr. [name].  I have reviewed the above documentation for accuracy and completeness, and I agree with the above.

## 2021-08-31 NOTE — Progress Notes (Signed)
Cough with yellow expectoration and congestion.  COVID-negative. I sent a prescription for levofloxacin.

## 2021-09-01 ENCOUNTER — Encounter (HOSPITAL_COMMUNITY): Payer: Self-pay

## 2021-09-01 ENCOUNTER — Other Ambulatory Visit: Payer: Self-pay

## 2021-09-01 ENCOUNTER — Inpatient Hospital Stay: Payer: 59

## 2021-09-01 ENCOUNTER — Inpatient Hospital Stay (HOSPITAL_BASED_OUTPATIENT_CLINIC_OR_DEPARTMENT_OTHER): Payer: 59 | Admitting: Hematology and Oncology

## 2021-09-01 ENCOUNTER — Ambulatory Visit (HOSPITAL_COMMUNITY)
Admission: RE | Admit: 2021-09-01 | Discharge: 2021-09-01 | Disposition: A | Discharge status: Home / Self Care | Payer: 59 | Source: Ambulatory Visit | Attending: Hematology and Oncology | Admitting: Hematology and Oncology

## 2021-09-01 ENCOUNTER — Encounter: Payer: Self-pay | Admitting: *Deleted

## 2021-09-01 ENCOUNTER — Other Ambulatory Visit: Payer: Self-pay | Admitting: Hematology and Oncology

## 2021-09-01 DIAGNOSIS — Z170 Estrogen receptor positive status [ER+]: Secondary | ICD-10-CM | POA: Diagnosis not present

## 2021-09-01 DIAGNOSIS — Z95828 Presence of other vascular implants and grafts: Secondary | ICD-10-CM

## 2021-09-01 DIAGNOSIS — C50811 Malignant neoplasm of overlapping sites of right female breast: Secondary | ICD-10-CM

## 2021-09-01 DIAGNOSIS — Z5111 Encounter for antineoplastic chemotherapy: Secondary | ICD-10-CM | POA: Diagnosis not present

## 2021-09-01 DIAGNOSIS — R058 Other specified cough: Secondary | ICD-10-CM

## 2021-09-01 LAB — CBC WITH DIFFERENTIAL (CANCER CENTER ONLY)
Abs Immature Granulocytes: 0 10*3/uL (ref 0.00–0.07)
Band Neutrophils: 2 %
Basophils Absolute: 0.1 10*3/uL (ref 0.0–0.1)
Basophils Relative: 1 %
Eosinophils Absolute: 0.2 10*3/uL (ref 0.0–0.5)
Eosinophils Relative: 3 %
HCT: 41.6 % (ref 36.0–46.0)
Hemoglobin: 13.9 g/dL (ref 12.0–15.0)
Lymphocytes Relative: 40 %
Lymphs Abs: 2.6 10*3/uL (ref 0.7–4.0)
MCH: 26.3 pg (ref 26.0–34.0)
MCHC: 33.4 g/dL (ref 30.0–36.0)
MCV: 78.8 fL — ABNORMAL LOW (ref 80.0–100.0)
Monocytes Absolute: 0.1 10*3/uL (ref 0.1–1.0)
Monocytes Relative: 1 %
Neutro Abs: 3.6 10*3/uL (ref 1.7–7.7)
Neutrophils Relative %: 53 %
Platelet Count: 223 10*3/uL (ref 150–400)
RBC: 5.28 MIL/uL — ABNORMAL HIGH (ref 3.87–5.11)
RDW: 12.7 % (ref 11.5–15.5)
WBC Count: 6.6 10*3/uL (ref 4.0–10.5)
nRBC: 0 % (ref 0.0–0.2)

## 2021-09-01 LAB — CMP (CANCER CENTER ONLY)
ALT: 19 U/L (ref 0–44)
AST: 14 U/L — ABNORMAL LOW (ref 15–41)
Albumin: 4.1 g/dL (ref 3.5–5.0)
Alkaline Phosphatase: 84 U/L (ref 38–126)
Anion gap: 12 (ref 5–15)
BUN: 8 mg/dL (ref 6–20)
CO2: 23 mmol/L (ref 22–32)
Calcium: 10 mg/dL (ref 8.9–10.3)
Chloride: 101 mmol/L (ref 98–111)
Creatinine: 0.62 mg/dL (ref 0.44–1.00)
GFR, Estimated: >60 mL/min (ref >60)
Glucose, Bld: 118 mg/dL — ABNORMAL HIGH (ref 70–99)
Potassium: 3.7 mmol/L (ref 3.5–5.1)
Sodium: 136 mmol/L (ref 135–145)
Total Bilirubin: 0.7 mg/dL (ref 0.3–1.2)
Total Protein: 7.4 g/dL (ref 6.5–8.1)

## 2021-09-01 MED ORDER — SODIUM CHLORIDE 0.9% FLUSH
10.0000 mL | Freq: Once | INTRAVENOUS | Status: AC
  Administered 2021-09-01: 10 mL

## 2021-09-01 MED ORDER — HEPARIN SOD (PORK) LOCK FLUSH 100 UNIT/ML IV SOLN
500.0000 [IU] | Freq: Once | INTRAVENOUS | Status: AC
Start: 2021-09-01 — End: 2021-09-01
  Administered 2021-09-01: 500 [IU]

## 2021-09-01 NOTE — Progress Notes (Signed)
Error

## 2021-09-01 NOTE — Assessment & Plan Note (Signed)
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, margins negative, 4/7 lymph nodes, ER 75 to 95%, PR 9095%, HER2 negative, Ki-67 25% Genetics: Negative MammaPrint: Low risk (this is not a valid test for 4+ lymph nodes)  Treatment plan: 1.Adjuvant chemotherapy with dose dense Adriamycin and Cytoxan x4 followed by Taxol weekly x12 started 08/25/2021 2.adjuvant radiation therapy 3.Followed by adjuvant antiestrogen therapy with complete estrogen blockade and abemaciclib ------------------------------------------------------------------------------------------------------------------------ Current treatment: Cycle 1 day 8 dose dense Adriamycin and Cytoxan 08/18/2021: Echocardiogram: EF 55 to 60%  Chemo toxicities: 1.  Severe fatigue  Sinus congestion: Being treated with antibiotics Skin itching: Improved

## 2021-09-06 NOTE — Progress Notes (Signed)
Patient Care Team: Adrian Prince, MD as PCP - General (Endocrinology) Pershing Proud, RN as Oncology Nurse Navigator Donnelly Angelica, RN as Oncology Nurse Navigator  DIAGNOSIS:    ICD-10-CM   1. Malignant neoplasm of overlapping sites of right breast in female, estrogen receptor positive (HCC)  C50.811    Z17.0       SUMMARY OF ONCOLOGIC HISTORY: Oncology History Overview Note  Ductal carcinoma in situ of the right breast  She palpated a lump in her right breast. Diagnostic mammogram and [place] on 06/08/21 showed irregular hypoechoic lesions in the right breast with a single abnormal right axillary lymph node without normal cortex. Biopsy on 06/16/21 showed high grade DCIS with calcifications and necrosis.   Malignant neoplasm of overlapping sites of right breast in female, estrogen receptor positive (HCC)  06/16/2021 Initial Diagnosis   Palpable right breast mass: Breast MRI revealed extensive involvement of the right breast with mass and non-mass enhancement superior right breast mass measures 4.9 x 1.2 cm and together with non-mass enhancement measured 8.8 cm, enhancing mass LOQ 1.3 cm, left breast indeterminate 0.9 cm mass and a 0.8 cm mass UOQ, single abnormal lymph node Biopsy 10:00, 11:00 and 12:00: IDC with DCIS, biopsy right axillary lymph node: IDC, ER 75 to 95%, PR 90 to 95%, Ki-67 25%, HER2 negative on 1 biopsy and 2+ by IHC and FISH pending   06/21/2021 Cancer Staging   Staging form: Breast, AJCC 8th Edition - Clinical stage from 06/21/2021: Stage IIA (cT3, cN1, cM0, G2, ER+, PR+, HER2-) - Signed by [name], MD on 06/21/2021 Histologic grading system: 3 grade system   07/01/2021 Genetic Testing   Negative genetic testing:  No pathogenic variants detected on the [facility] BRCAplus panel (report date 07/01/2021) or the [facility] CancerNext-Expanded + RNAinsight panel (report date 07/01/2021).   The BRCAplus panel offered by [employer] and includes sequencing and  deletion/duplication analysis for the following 8 genes: ATM, BRCA1, BRCA2, CDH1, CHEK2, PALB2, PTEN, and TP53. The CancerNext-Expanded + RNAinsight gene panel offered by [employer] and includes sequencing and rearrangement analysis for the following 77 genes: AIP, ALK, APC, ATM, AXIN2, BAP1, BARD1, BLM, BMPR1A, BRCA1, BRCA2, BRIP1, CDC73, CDH1, CDK4, CDKN1B, CDKN2A, CHEK2, CTNNA1, DICER1, FANCC, FH, FLCN, GALNT12, KIF1B, LZTR1, MAX, MEN1, MET, MLH1, MSH2, MSH3, MSH6, MUTYH, NBN, NF1, NF2, NTHL1, PALB2, PHOX2B, PMS2, POT1, PRKAR1A, PTCH1, PTEN, RAD51C, RAD51D, RB1, RECQL, RET, SDHA, SDHAF2, SDHB, SDHC, SDHD, SMAD4, SMARCA4, SMARCB1, SMARCE1, STK11, SUFU, TMEM127, TP53, TSC1, TSC2, VHL and XRCC2 (sequencing and deletion/duplication); EGFR, EGLN1, HOXB13, KIT, MITF, PDGFRA, POLD1 and POLE (sequencing only); EPCAM and GREM1 (deletion/duplication only). RNA data is routinely analyzed for use in variant interpretation for all genes.   07/25/2021 Surgery   Right mastectomy: Foci of invasive ductal carcinoma measuring 1.1 cm grade 2 with extensive DCIS spanning 8.9 cm, margins negative, 4/7 lymph nodes, ER 75 to 95%, PR 9095%, HER2 negative, Ki-67 25%   08/25/2021 -  Chemotherapy    Patient is on Treatment Plan: BREAST ADJUVANT DOSE DENSE AC Q14D / PACLITAXEL Q7D         CHIEF COMPLIANT: Cycle 2 dose dense Adriamycin and Cytoxan  INTERVAL HISTORY: Allison Reed is a 45 y.o. with above-mentioned history of the right breast having undergone right mastectomy, currently on chemotherapy with dose dense Adriamycin and Cytoxan. She presents to the clinic today for treatment.   ALLERGIES:  is allergic to betadine [povidone iodine] and tape.  MEDICATIONS:  Current Outpatient Medications  Medication Sig Dispense Refill   Empagliflozin-metFORMIN HCl ER (SYNJARDY XR) 09-999 MG TB24 Take by mouth.     levofloxacin (LEVAQUIN) 500 MG tablet Take 1 tablet (500 mg total) by mouth daily. 7 tablet 0    lidocaine-prilocaine (EMLA) cream Apply to affected area once 30 g 3   LINAGLIPTIN PO Take by mouth.     ondansetron (ZOFRAN) 8 MG tablet Take 1 tablet (8 mg total) by mouth 2 (two) times daily as needed. Start on the third day after chemotherapy. 30 tablet 1   prochlorperazine (COMPAZINE) 10 MG tablet Take 1 tablet (10 mg total) by mouth every 6 (six) hours as needed (Nausea or vomiting). 30 tablet 1   sitaGLIPtin (JANUVIA) 100 MG tablet Take 100 mg by mouth daily.     tamoxifen (NOLVADEX) 20 MG tablet TAKE 1 TABLET BY MOUTH EVERY DAY 90 tablet 0   No current facility-administered medications for this visit.    PHYSICAL EXAMINATION: ECOG PERFORMANCE STATUS: 1 - Symptomatic but completely ambulatory  Vitals:   09/08/21 0916  BP: 107/73  Pulse: 88  Resp: 18  Temp: 97.9 F (36.6 C)  SpO2: 100%   Filed Weights   09/08/21 0916  Weight: 155 lb 1.6 oz (70.4 kg)    LABORATORY DATA:  I have reviewed the data as listed CMP Latest Ref Rng & Units 09/01/2021 08/25/2021 08/22/2021  Glucose 70 - 99 mg/dL 118(H) 233(H) 154(H)  BUN 6 - 20 mg/dL $Remove'8 8 6  'BRqSSDB$ Creatinine 0.44 - 1.00 mg/dL 0.62 0.68 0.53  Sodium 135 - 145 mmol/L 136 140 134(L)  Potassium 3.5 - 5.1 mmol/L 3.7 3.6 4.2  Chloride 98 - 111 mmol/L 101 104 100  CO2 22 - 32 mmol/L $RemoveB'23 22 25  'lErIOvPJ$ Calcium 8.9 - 10.3 mg/dL 10.0 9.0 8.9  Total Protein 6.5 - 8.1 g/dL 7.4 6.8 -  Total Bilirubin 0.3 - 1.2 mg/dL 0.7 0.2(L) -  Alkaline Phos 38 - 126 U/L 84 59 -  AST 15 - 41 U/L 14(L) 14(L) -  ALT 0 - 44 U/L 19 16 -    Lab Results  Component Value Date   WBC 6.6 09/01/2021   HGB 13.9 09/01/2021   HCT 41.6 09/01/2021   MCV 78.8 (L) 09/01/2021   PLT 223 09/01/2021   NEUTROABS 3.6 09/01/2021    ASSESSMENT & PLAN:  Malignant neoplasm of overlapping sites of right breast in female, estrogen receptor positive ([place]) 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, margins  negative, 4/7 lymph nodes, ER 75 to 95%, PR 9095%, HER2 negative, Ki-67 25% Genetics: Negative MammaPrint: Low risk (this is not a valid test for 4+ lymph nodes)   Treatment plan: 1.  Adjuvant chemotherapy with dose dense Adriamycin and Cytoxan x4 followed by Taxol weekly x12 started 08/25/2021 2. adjuvant radiation therapy 3.  Followed by adjuvant antiestrogen therapy with complete estrogen blockade and abemaciclib ------------------------------------------------------------------------------------------------------------------------ Current treatment: Cycle 2 dose dense Adriamycin and Cytoxan 08/18/2021: Echocardiogram: EF 55 to 60%   Chemo toxicities: 1.  Severe fatigue 2. severe upper respiratory infection/bronchitis/sinus congestion: Mostly improved except for a dry cough: I encouraged her to take over-the-counter Protonix in case this was related to underlying GERD 3.  Severe bone pain due to Neulasta: We discussed couple of options.  We can give her Percocets to be taken as needed versus discontinuing Neulasta and doing her treatment every 3 weeks instead of every 2 weeks.  She would like  to try this treatment with the Percocets and see how she does. Skin itching: Improved   Diabetes: Appears to be under reasonable control.  We are giving her Decadron 4 mg with each treatment.    Return to clinic in 2 weeks for cycle 3.    No orders of the defined types were placed in this encounter.  The patient has a good understanding of the overall plan. she agrees with it. she will call with any problems that may develop before the next visit here.  Total time spent: 30 mins including face to face time and time spent for planning, charting and coordination of care  Rulon Eisenmenger, MD, MPH 09/08/2021  I, Thana Ates, am acting as scribe for Dr. [name].  I have reviewed the above documentation for accuracy and completeness, and I agree with the above.

## 2021-09-07 MED FILL — Fosaprepitant Dimeglumine For IV Infusion 150 MG (Base Eq): INTRAVENOUS | Qty: 5 | Status: AC

## 2021-09-08 ENCOUNTER — Other Ambulatory Visit: Payer: Self-pay

## 2021-09-08 ENCOUNTER — Inpatient Hospital Stay: Payer: 59

## 2021-09-08 ENCOUNTER — Inpatient Hospital Stay (HOSPITAL_BASED_OUTPATIENT_CLINIC_OR_DEPARTMENT_OTHER): Payer: 59 | Admitting: Hematology and Oncology

## 2021-09-08 DIAGNOSIS — C50811 Malignant neoplasm of overlapping sites of right female breast: Secondary | ICD-10-CM

## 2021-09-08 DIAGNOSIS — Z170 Estrogen receptor positive status [ER+]: Secondary | ICD-10-CM

## 2021-09-08 DIAGNOSIS — Z5111 Encounter for antineoplastic chemotherapy: Secondary | ICD-10-CM | POA: Diagnosis not present

## 2021-09-08 DIAGNOSIS — Z95828 Presence of other vascular implants and grafts: Secondary | ICD-10-CM

## 2021-09-08 LAB — CMP (CANCER CENTER ONLY)
ALT: 28 U/L (ref 0–44)
AST: 20 U/L (ref 15–41)
Albumin: 3.9 g/dL (ref 3.5–5.0)
Alkaline Phosphatase: 81 U/L (ref 38–126)
Anion gap: 14 (ref 5–15)
BUN: 8 mg/dL (ref 6–20)
CO2: 21 mmol/L — ABNORMAL LOW (ref 22–32)
Calcium: 9.2 mg/dL (ref 8.9–10.3)
Chloride: 106 mmol/L (ref 98–111)
Creatinine: 0.64 mg/dL (ref 0.44–1.00)
GFR, Estimated: >60 mL/min (ref >60)
Glucose, Bld: 164 mg/dL — ABNORMAL HIGH (ref 70–99)
Potassium: 3.7 mmol/L (ref 3.5–5.1)
Sodium: 141 mmol/L (ref 135–145)
Total Bilirubin: 0.3 mg/dL (ref 0.3–1.2)
Total Protein: 6.5 g/dL (ref 6.5–8.1)

## 2021-09-08 LAB — CBC WITH DIFFERENTIAL (CANCER CENTER ONLY)
Abs Immature Granulocytes: 0.67 10*3/uL — ABNORMAL HIGH (ref 0.00–0.07)
Basophils Absolute: 0.1 10*3/uL (ref 0.0–0.1)
Basophils Relative: 1 %
Eosinophils Absolute: 0.2 10*3/uL (ref 0.0–0.5)
Eosinophils Relative: 2 %
HCT: 39.2 % (ref 36.0–46.0)
Hemoglobin: 12.8 g/dL (ref 12.0–15.0)
Immature Granulocytes: 8 %
Lymphocytes Relative: 24 %
Lymphs Abs: 2.1 10*3/uL (ref 0.7–4.0)
MCH: 26.2 pg (ref 26.0–34.0)
MCHC: 32.7 g/dL (ref 30.0–36.0)
MCV: 80.2 fL (ref 80.0–100.0)
Monocytes Absolute: 0.6 10*3/uL (ref 0.1–1.0)
Monocytes Relative: 7 %
Neutro Abs: 5.2 10*3/uL (ref 1.7–7.7)
Neutrophils Relative %: 58 %
Platelet Count: 204 10*3/uL (ref 150–400)
RBC: 4.89 MIL/uL (ref 3.87–5.11)
RDW: 13.3 % (ref 11.5–15.5)
WBC Count: 8.7 10*3/uL (ref 4.0–10.5)
nRBC: 0.2 % (ref 0.0–0.2)

## 2021-09-08 MED ORDER — SODIUM CHLORIDE 0.9% FLUSH
10.0000 mL | INTRAVENOUS | Status: DC | PRN
Start: 2021-09-08 — End: 2021-09-08
  Administered 2021-09-08: 10 mL

## 2021-09-08 MED ORDER — DEXAMETHASONE SODIUM PHOSPHATE 10 MG/ML IJ SOLN
4.0000 mg | Freq: Once | INTRAMUSCULAR | Status: AC
  Administered 2021-09-08: 4 mg via INTRAVENOUS
  Filled 2021-09-08: qty 1

## 2021-09-08 MED ORDER — SODIUM CHLORIDE 0.9 % IV SOLN
150.0000 mg | Freq: Once | INTRAVENOUS | Status: AC
  Administered 2021-09-08: 150 mg via INTRAVENOUS
  Filled 2021-09-08: qty 150

## 2021-09-08 MED ORDER — HEPARIN SOD (PORK) LOCK FLUSH 100 UNIT/ML IV SOLN
500.0000 [IU] | Freq: Once | INTRAVENOUS | Status: AC | PRN
  Administered 2021-09-08: 500 [IU]

## 2021-09-08 MED ORDER — SODIUM CHLORIDE 0.9 % IV SOLN
600.0000 mg/m2 | Freq: Once | INTRAVENOUS | Status: AC
  Administered 2021-09-08: 1080 mg via INTRAVENOUS
  Filled 2021-09-08: qty 54

## 2021-09-08 MED ORDER — SODIUM CHLORIDE 0.9 % IV SOLN
4.0000 mg | Freq: Once | INTRAVENOUS | Status: DC
  Filled 2021-09-08: qty 0.4

## 2021-09-08 MED ORDER — PALONOSETRON HCL INJECTION 0.25 MG/5ML
0.2500 mg | Freq: Once | INTRAVENOUS | Status: AC
  Administered 2021-09-08: 0.25 mg via INTRAVENOUS
  Filled 2021-09-08: qty 5

## 2021-09-08 MED ORDER — SODIUM CHLORIDE 0.9% FLUSH
10.0000 mL | Freq: Once | INTRAVENOUS | Status: AC
  Administered 2021-09-08: 10 mL

## 2021-09-08 MED ORDER — OXYCODONE-ACETAMINOPHEN 5-325 MG PO TABS: 1.0000 | ORAL_TABLET | Freq: Three times a day (TID) | ORAL | 0 refills | Status: DC | PRN

## 2021-09-08 MED ORDER — SODIUM CHLORIDE 0.9 % IV SOLN: Freq: Once | INTRAVENOUS | Status: AC

## 2021-09-08 MED ORDER — DOXORUBICIN HCL CHEMO IV INJECTION 2 MG/ML
60.0000 mg/m2 | Freq: Once | INTRAVENOUS | Status: AC
  Administered 2021-09-08: 108 mg via INTRAVENOUS
  Filled 2021-09-08: qty 54

## 2021-09-08 NOTE — Patient Instructions (Signed)
[facility] ONCOL[facility]   Discharge Instructions: Thank you for choosing [facility] to provide your oncology and hematology care.   If you have a lab appointment with the [facility], please go directly to the [facility] and check in at the registration area.   Wear comfortable clothing and clothing appropriate for easy access to any Portacath or PICC line.   We strive to give you quality time with your provider. You may need to reschedule your appointment if you arrive late (15 or more minutes).  Arriving late affects you and other patients whose appointments are after yours.  Also, if you miss three or more appointments without notifying the office, you may be dismissed from the clinic at the provider's discretion.      For prescription refill requests, have your pharmacy contact our office and allow 72 hours for refills to be completed.    Today you received the following chemotherapy and/or immunotherapy agents: Doxorubicin (Adriamycin) and Cyclophosphamide (Cytoxan).       To help prevent nausea and vomiting after your treatment, we encourage you to take your nausea medication as directed.  BELOW ARE SYMPTOMS THAT SHOULD BE REPORTED IMMEDIATELY: *FEVER GREATER THAN 100.4 F (38 C) OR HIGHER *CHILLS OR SWEATING *NAUSEA AND VOMITING THAT IS NOT CONTROLLED WITH YOUR NAUSEA MEDICATION *UNUSUAL SHORTNESS OF BREATH *UNUSUAL BRUISING OR BLEEDING *URINARY PROBLEMS (pain or burning when urinating, or frequent urination) *BOWEL PROBLEMS (unusual diarrhea, constipation, pain near the anus) TENDERNESS IN MOUTH AND THROAT WITH OR WITHOUT PRESENCE OF ULCERS (sore throat, sores in mouth, or a toothache) UNUSUAL RASH, SWELLING OR PAIN  UNUSUAL VAGINAL DISCHARGE OR ITCHING   Items with * indicate a potential emergency and should be followed up as soon as possible or go to the Emergency Department if any problems should occur.  Please show the CHEMOTHERAPY  ALERT CARD or IMMUNOTHERAPY ALERT CARD at check-in to the Emergency Department and triage nurse.  Should you have questions after your visit or need to cancel or reschedule your appointment, please contact [facility]  Dept: [phone]  and follow the prompts.  Office hours are 8:00 a.m. to 4:30 p.m. Monday - Friday. Please note that voicemails left after 4:00 p.m. may not be returned until the following business day.  We are closed weekends and major holidays. You have access to a nurse at all times for urgent questions. Please call the main number to the clinic Dept: [phone] and follow the prompts.   For any non-urgent questions, you may also contact your provider using MyChart. We now offer e-Visits for anyone 41 and older to request care online for non-urgent symptoms. For details visit [email or web address].   Also download the MyChart app! Go to the app store, search "MyChart", open the app, select [facility], and log in with your MyChart username and password.  Due to Covid, a mask is required upon entering the hospital/clinic. If you do not have a mask, one will be given to you upon arrival. For doctor visits, patients may have 1 support person aged 51 or older with them. For treatment visits, patients cannot have anyone with them due to current Covid guidelines and our immunocompromised population.

## 2021-09-08 NOTE — Assessment & Plan Note (Signed)
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, margins negative, 4/7 lymph nodes, ER 75 to 95%, PR 9095%, HER2 negative, Ki-67 25% Genetics: Negative MammaPrint: Low risk (this is not a valid test for 4+ lymph nodes)  Treatment plan: 1.Adjuvant chemotherapy with dose dense Adriamycin and Cytoxan x4 followed by Taxol weekly x12 started 08/25/2021 2.adjuvant radiation therapy 3.Followed by adjuvant antiestrogen therapy with complete estrogen blockade and abemaciclib ------------------------------------------------------------------------------------------------------------------------ Current treatment: Cycle 2 dose dense Adriamycin and Cytoxan 08/18/2021: Echocardiogram: EF 55 to 60%  Chemo toxicities: 1.  Severe fatigue 2. severe upper respiratory infection/bronchitis/sinus congestion  Skin itching: Improved  Diabetes: Appears to be under reasonable control. Sore throat and earaches from chemo as well as burning with urination  Tachycardia: Most likely related to the infection.  She has been keeping her hydration up. Return to clinic in 2 weeks for cycle 3.

## 2021-09-09 ENCOUNTER — Inpatient Hospital Stay: Payer: 59 | Attending: Hematology and Oncology

## 2021-09-09 ENCOUNTER — Other Ambulatory Visit: Payer: Self-pay

## 2021-09-09 VITALS — BP 102/63 | HR 85 | Temp 97.7°F | Resp 16

## 2021-09-09 DIAGNOSIS — E119 Type 2 diabetes mellitus without complications: Secondary | ICD-10-CM | POA: Diagnosis not present

## 2021-09-09 DIAGNOSIS — Z5189 Encounter for other specified aftercare: Secondary | ICD-10-CM | POA: Diagnosis not present

## 2021-09-09 DIAGNOSIS — Z7985 Long-term (current) use of injectable non-insulin antidiabetic drugs: Secondary | ICD-10-CM | POA: Insufficient documentation

## 2021-09-09 DIAGNOSIS — Z7984 Long term (current) use of oral hypoglycemic drugs: Secondary | ICD-10-CM | POA: Diagnosis not present

## 2021-09-09 DIAGNOSIS — C50811 Malignant neoplasm of overlapping sites of right female breast: Secondary | ICD-10-CM | POA: Insufficient documentation

## 2021-09-09 DIAGNOSIS — Z5111 Encounter for antineoplastic chemotherapy: Secondary | ICD-10-CM | POA: Insufficient documentation

## 2021-09-09 DIAGNOSIS — L309 Dermatitis, unspecified: Secondary | ICD-10-CM | POA: Insufficient documentation

## 2021-09-09 DIAGNOSIS — Z9011 Acquired absence of right breast and nipple: Secondary | ICD-10-CM | POA: Insufficient documentation

## 2021-09-09 DIAGNOSIS — Z170 Estrogen receptor positive status [ER+]: Secondary | ICD-10-CM | POA: Diagnosis not present

## 2021-09-09 MED ORDER — PEGFILGRASTIM-BMEZ 6 MG/0.6ML ~~LOC~~ SOSY
6.0000 mg | PREFILLED_SYRINGE | Freq: Once | SUBCUTANEOUS | Status: AC
  Administered 2021-09-09: 6 mg via SUBCUTANEOUS

## 2021-09-09 MED ORDER — PEGFILGRASTIM-BMEZ 6 MG/0.6ML ~~LOC~~ SOSY
PREFILLED_SYRINGE | SUBCUTANEOUS | Status: AC
  Filled 2021-09-09: qty 0.6

## 2021-09-14 ENCOUNTER — Encounter: Payer: Self-pay | Admitting: Hematology and Oncology

## 2021-09-18 ENCOUNTER — Encounter: Payer: Self-pay | Admitting: Hematology and Oncology

## 2021-09-19 ENCOUNTER — Ambulatory Visit: Payer: 59 | Attending: Endocrinology

## 2021-09-19 ENCOUNTER — Other Ambulatory Visit: Payer: Self-pay

## 2021-09-19 DIAGNOSIS — Z170 Estrogen receptor positive status [ER+]: Secondary | ICD-10-CM | POA: Insufficient documentation

## 2021-09-19 DIAGNOSIS — C50811 Malignant neoplasm of overlapping sites of right female breast: Secondary | ICD-10-CM | POA: Insufficient documentation

## 2021-09-19 DIAGNOSIS — M25611 Stiffness of right shoulder, not elsewhere classified: Secondary | ICD-10-CM | POA: Insufficient documentation

## 2021-09-19 DIAGNOSIS — R293 Abnormal posture: Secondary | ICD-10-CM | POA: Diagnosis present

## 2021-09-19 NOTE — Therapy (Signed)
[name] @ [place], [place], [address] Phone: [phone]   Fax:  [phone]  Physical Therapy Treatment  Patient Details  Name: Allison Reed MRN: [id number] Date of Birth: 1976/09/25 Referring Provider (PT): Dr. Donne Hazel   Encounter Date: 09/19/2021   PT End of Session - 09/19/21 0956     Visit Number 2    Number of Visits 10    Date for PT Re-Evaluation 11/14/21    PT Start Time 0901    PT Stop Time 0945    PT Time Calculation (min) 44 min    Activity Tolerance Patient tolerated treatment well    Behavior During Therapy [facility] for tasks assessed/performed             Past Medical History:  Diagnosis Date   Diabetes mellitus without complication ([place])    Type 2   Family history of brain tumor    Family history of skin cancer    Gestational diabetes    Headache    otc med prn   Heartburn in pregnancy    Incompetent cervix in pregnancy 04/2015   Missed abortion    no surgery required   Postpartum care following cesarean delivery (11/2) 10/12/2015   right breast ca 06/2021   UTI in pregnancy 03/2015   recent UTI, treatment completed    Past Surgical History:  Procedure Laterality Date   APPENDECTOMY  2006   BREAST RECONSTRUCTION WITH PLACEMENT OF TISSUE EXPANDER AND ALLODERM Right 07/25/2021   Procedure: RIGHT BREAST RECONSTRUCTION WITH PLACEMENT OF TISSUE EXPANDER AND ALLODERM;  Surgeon: Irene Limbo, MD;  Location: [place] [facility];  Service: Plastics;  Laterality: Right;   CERCLAGE LAPAROSCOPIC ABDOMINAL N/A 04/15/2015   Procedure: LAPAROSCOPIC TRANSABDOMINAL CERVCOISTHMIC CERCLAGE;  Surgeon: Governor Specking, MD;  Location: [facility];  Service: Gynecology;  Laterality: N/A;   CERVICAL CERCLAGE  02/2011   vaginal   CESAREAN SECTION N/A 10/12/2015   Procedure: CESAREAN SECTION;  Surgeon: Princess Bruins, MD;  Location: [facility];  Service: Obstetrics;  Laterality: N/A;    CESAREAN SECTION N/A 03/03/2018   Procedure: Repeat CESAREAN SECTION/Removal of Abdominal Cerclage;  Surgeon: Azucena Fallen, MD;  Location: [facility];  Service: Obstetrics;  Laterality: N/A;  EDD: 03/28/18   MASTECTOMY W/ SENTINEL NODE BIOPSY Right 07/25/2021   Procedure: RIGHT MASTECTOMY WITH AXILLARY SENTINEL LYMPH NODE BIOPSY;  Surgeon: [name], MD;  Location: [facility];  Service: General;  Laterality: Right;   PORTACATH PLACEMENT N/A 08/24/2021   Procedure: INSERTION PORT-A-CATH;  Surgeon: [name], MD;  Location: [place];  Service: General;  Laterality: N/A;   RADIOACTIVE SEED GUIDED AXILLARY SENTINEL LYMPH NODE Right 07/25/2021   Procedure: RADIOACTIVE SEED GUIDED RIGHT AXILLARY NODE EXCISION;  Surgeon: [name], MD;  Location: [facility];  Service: General;  Laterality: Right;   [place] EXTRACTION  2001    There were no vitals filed for this visit.   Subjective Assessment - 09/19/21 0902     Subjective Surgery went well and I dont have any pain at rest but I usualy take a tylenol or motrin once a day.I feel a tingling in the right medial arm and an achiness there when I do things. I can't do the movements for my [place] dancing.  I can reach to cabinets but I feel it in my medial arm. 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 She noted a palpable right breast mass. This is followed by a mammogram with a mass with calcifications as really throughout the upper outer quadrant and in the central breast. This measures 9.4 x 8.7 x 6.7 cm. On ultrasound she had 3 separate masses at 10, 11, and 12:00. The sizes are 1.7, 2.1, and 2.2 cm. There is also a single abnormal axillary lymph node that has undergone biopsy. She also has undergone an MRI that shows extensive right breast disease with both  mass and non-mass enhancement involving the majority of the superior and upper outer right breast with relative sparing of the lower inner quadrant.Biopsy of the right breast lesions or a grade 2 invasive ductal carcinoma that is very ER, PR positive, HER2 negative, and Ki-67 is 25%. She is s/p right breast mastectomy with SLNB 0/7 LN on August 16,2022 and also has tissue expander in place.  She is currently undergoing chemotherapy for 6 months every other week and radiation will follow    Patient Stated Goals Reassess after surgery    Currently in Pain? No/denies                [facility] PT Assessment - 09/19/21 0001       Assessment   Medical Diagnosis Right breast Cancer    Referring Provider (PT) Dr. Donne Hazel    Hand Dominance Right    Prior Therapy yes      Precautions   Precaution Comments lymphedema risk      Restrictions   Weight Bearing Restrictions No      Balance Screen   Has the patient fallen in the past 6 months No    Has the patient had a decrease in activity level because of a fear of falling?  No    Is the patient reluctant to leave their home because of a fear of falling?  No      Prior Function   Level of Independence Independent    Leisure Dancer,runner      Cognition   Overall Cognitive Status Within Functional Limits for tasks assessed      Observation/Other Assessments   Observations incisions well healed. all glue is off. no visible swelling.  Mild cording evident axilla to elbow    Skin Integrity portacath present on right side      AROM   Right Shoulder Extension 45 Degrees    Right Shoulder Flexion 140 Degrees    Right Shoulder ABduction 150 Degrees    Right Shoulder Internal Rotation --   T7   Right Shoulder External Rotation 90 Degrees    Left Shoulder Extension 50 Degrees    Left Shoulder Flexion 160 Degrees    Left Shoulder ABduction 164 Degrees    Left Shoulder Internal Rotation --   T6              LYMPHEDEMA/ONCOLOGY  QUESTIONNAIRE - 09/19/21 0001       Type   Cancer Type Invasive Ductal Carcinoma      Surgeries   Mastectomy Date 07/25/21    Number Lymph Nodes Removed 7   4 with cancer     Treatment   Active Chemotherapy Treatment Yes    Past Chemotherapy Treatment No    Active Radiation Treatment No   pending   Past Radiation Treatment No    Current Hormone Treatment No    Past Hormone Therapy  No      What other symptoms do you have   Are you Having Heaviness or Tightness No    Are you having Pain --   achy with reaching   Are you having pitting edema No    Is it Hard or Difficult finding clothes that fit No    Do you have infections No    Is there Decreased scar mobility No      Right Upper Extremity Lymphedema   10 cm Proximal to Olecranon Process 24.5 cm    Olecranon Process 24.7 cm    10 cm Proximal to Ulnar Styloid Process 20.1 cm    Just Proximal to Ulnar Styloid Process 14.7 cm    At Base of 2nd Digit 5.8 cm      Left Upper Extremity Lymphedema   10 cm Proximal to Olecranon Process 24.2 cm    Olecranon Process 24.3 cm    10 cm Proximal to Ulnar Styloid Process 19.5 cm    Just Proximal to Ulnar Styloid Process 14.8 cm    At Base of 2nd Digit 5.8 cm                Quick Dash - 09/19/21 0001     Do heavy household chores (wash walls, wash floors) No difficulty    Carry a shopping bag or briefcase Moderate difficulty    Wash your back No difficulty    Use a knife to cut food Mild difficulty    Recreational activities in which you take some force or impact through your arm, shoulder, or hand (golf, hammering, tennis) No difficulty    During the past week, to what extent has your arm, shoulder or hand problem interfered with your normal social activities with family, friends, neighbors, or groups? Modererately    During the past week, to what extent has your arm, shoulder or hand problem limited your work or other regular daily activities Not at all    Arm, shoulder, or  hand pain. None    Tingling (pins and needles) in your arm, shoulder, or hand Mild    Difficulty Sleeping Mild difficulty    DASH Score 13.64 %                    [facility] Adult PT Treatment/Exercise - 09/19/21 0001       Shoulder Exercises: Supine   Other Supine Exercises supine wand flexion and scaption x 5 ea      Shoulder Exercises: Standing   Other Standing Exercises abduction wall slides x 5                     PT Education - 09/19/21 0951     Education Details pt educated in supine wand flexion and scaption x 5 ea    Person(s) Educated Patient    Methods Explanation    Comprehension Verbalized understanding                 PT Long Term Goals - 09/19/21 1010       PT LONG TERM GOAL #1   Title Pt will achieve full right shoulder ROM post surgery for full return to daily activities    Time 8    Period Weeks    Status New    Target Date 11/14/21      PT LONG TERM GOAL #2   Title Pt will improve right shoulder flexion and scaption by atleast 15 degrees  Baseline flexion 140,  abd 150    Time 4    Period Weeks    Status New    Target Date 10/17/21      PT LONG TERM GOAL #3   Title Pt will report decrease achiness and pulling from cording by 50% or more    Time 8    Period Weeks    Status New    Target Date 11/14/21      PT LONG TERM GOAL #4   Title Pt will be able to perform arm movements to continue with her indian dancing    Time 8    Period Weeks    Status New    Target Date 11/14/21                   Plan - 09/19/21 0957     Clinical Impression Statement Pt is s/p right breast mastectomy with 4+/7 LN's and is currently undergoing 6 months of chemotherapy and will have radiation.  She has tissue expander in place. Incisions are well healed and there is no visible swelling. Shoulder ROM is limited on the right, and mild cording is evident in the right  axilla, and medial arm .  She was instructed in supine wand  exercises, and we reviewed abduction wall slides.  She was able to return demonstrate all.  She will benefit from skilled PT on alternate weeks of her chemo to reduce cording and restore ROM    Personal Factors and Comorbidities Comorbidity 1    Comorbidities right breast cancer pending mastectomy and radiation    Stability/Clinical Decision Making Stable/Uncomplicated    Clinical Decision Making Low    Rehab Potential Excellent    PT Frequency Biweekly   every other week   PT Duration 8 weeks    PT Treatment/Interventions ADLs/Self Care Home Management;Therapeutic exercise;Manual techniques;Patient/family education;Manual lymph drainage;Scar mobilization;Passive range of motion    PT Next Visit Plan MFR to right axilla/medial arm cording,  AAROM,PROM, AROM, progress to strength as tolerated    PT Home Exercise Plan 4 post op exs , supine wand flexion and scaption    Recommended Other Services Scheduled for ABC class and next SOZO today, prophylactic compression sleeve    Consulted and Agree with Plan of Care Patient             Patient will benefit from skilled therapeutic intervention in order to improve the following deficits and impairments:  Postural dysfunction, Decreased knowledge of precautions, Decreased scar mobility, Increased fascial restricitons, Decreased range of motion  Visit Diagnosis: Malignant neoplasm of overlapping sites of right breast in female, estrogen receptor positive ([facility])  Abnormal posture  Stiffness of right shoulder, not elsewhere classified     Problem List Patient Active Problem List   Diagnosis Date Noted   Port-A-Cath in place 08/25/2021   S/P mastectomy, right 07/25/2021   Genetic testing 07/01/2021   Cancer ([place]) 06/26/2021   Family history of skin cancer 06/22/2021   Family history of brain tumor 06/22/2021   Malignant neoplasm of overlapping sites of right breast in female, estrogen receptor positive ([place]) 06/21/2021   Premature uterine  contractions causing threatened premature labor in third trimester 03/03/2018   Previous cesarean delivery, delivered 03/03/2018   Postpartum care following cesarean delivery (3/25) 03/03/2018   Status post repeat low transverse cesarean section 03/03/2018   DM (diabetes mellitus) in pregnancy 03/02/2018   Incompetent cervix in pregnancy 03/02/2018   Premature uterine contractions in third trimester, antepartum  03/02/2018   Active labor 10/12/2015    Claris Pong, PT 09/19/2021, 10:13 AM  [facility] @ [place], [place], [address] Phone: [phone]   Fax:  [phone]  Name: Allison Reed MRN: [id number] Date of Birth: 1976-03-08

## 2021-09-19 NOTE — Patient Instructions (Signed)
SHOULDER: Flexion - Supine (Cane)        Cancer Rehab [phone]    Hold cane in both hands. Raise arms up overhead. Do not allow back to arch. Hold _5__ seconds. Do __5__ times; __2__ times a day. 1. Hands shoulder width 2. Hands slightly wider apart (V position)   Hold cane with both hands. Move both arms from side to side, keep elbows straight.  Hold when stretch felt for __5__ seconds. Repeat __5-10__ times; __1-2__ times a day. Once this becomes easier progress to third picture bringing affected arm towards ear by staying out to side. Same hold for _5_seconds. Repeat  _5-10_ times, _1-2_ times/day.  Shoulder Blade Stretch    Clasp fingers behind head with elbows touching in front of face. Pull elbows back while pressing shoulder blades together. Relax and hold as tolerated, can place pillow under elbow here for comfort as needed and to allow for prolonged stretch.  Repeat __5__ times. Do __1-2__ sessions per day.     Copyright  VHI. All rights reserved.

## 2021-09-20 NOTE — Progress Notes (Signed)
Patient Care Team: [name], MD as PCP - General (Endocrinology) Mauro Kaufmann, RN as Oncology Nurse Navigator Rockwell Germany, RN as Oncology Nurse Navigator  DIAGNOSIS: No diagnosis found.  SUMMARY OF ONCOLOGIC HISTORY: Oncology History Overview Note  Ductal carcinoma in situ of the right breast  She palpated a lump in her right breast. Diagnostic mammogram and [place] on 06/08/21 showed irregular hypoechoic lesions in the right breast with a single abnormal right axillary lymph node without normal cortex. Biopsy on 06/16/21 showed high grade DCIS with calcifications and necrosis.   Malignant neoplasm of overlapping sites of right breast in female, estrogen receptor positive ([place])  06/16/2021 Initial Diagnosis   Palpable right breast mass: Breast MRI revealed extensive involvement of the right breast with mass and non-mass enhancement superior right breast mass measures 4.9 x 1.2 cm and together with non-mass enhancement measured 8.8 cm, enhancing mass LOQ 1.3 cm, left breast indeterminate 0.9 cm mass and a 0.8 cm mass UOQ, single abnormal lymph node Biopsy 10:00, 11:00 and 12:00: IDC with DCIS, biopsy right axillary lymph node: IDC, ER 75 to 95%, PR 90 to 95%, Ki-67 25%, HER2 negative on 1 biopsy and 2+ by IHC and FISH pending   06/21/2021 Cancer Staging   Staging form: Breast, AJCC 8th Edition - Clinical stage from 06/21/2021: Stage IIA (cT3, cN1, cM0, G2, ER+, PR+, HER2-) - Signed by [name], MD on 06/21/2021 Histologic grading system: 3 grade system   07/01/2021 Genetic Testing   Negative genetic testing:  No pathogenic variants detected on the [employer] BRCAplus panel (report date 07/01/2021) or the [employer] CancerNext-Expanded + RNAinsight panel (report date 07/01/2021).   The BRCAplus panel offered by [employer] and includes sequencing and deletion/duplication analysis for the following 8 genes: ATM, BRCA1, BRCA2, CDH1, CHEK2, PALB2, PTEN, and TP53. The CancerNext-Expanded +  RNAinsight gene panel offered by [employer] and includes sequencing and rearrangement analysis for the following 77 genes: AIP, ALK, APC, ATM, AXIN2, BAP1, BARD1, BLM, BMPR1A, BRCA1, BRCA2, BRIP1, CDC73, CDH1, CDK4, CDKN1B, CDKN2A, CHEK2, CTNNA1, DICER1, FANCC, FH, FLCN, GALNT12, KIF1B, LZTR1, MAX, MEN1, MET, MLH1, MSH2, MSH3, MSH6, MUTYH, NBN, NF1, NF2, NTHL1, PALB2, PHOX2B, PMS2, POT1, PRKAR1A, PTCH1, PTEN, RAD51C, RAD51D, RB1, RECQL, RET, SDHA, SDHAF2, SDHB, SDHC, SDHD, SMAD4, SMARCA4, SMARCB1, SMARCE1, STK11, SUFU, TMEM127, TP53, TSC1, TSC2, VHL and XRCC2 (sequencing and deletion/duplication); EGFR, EGLN1, HOXB13, KIT, MITF, PDGFRA, POLD1 and POLE (sequencing only); EPCAM and GREM1 (deletion/duplication only). RNA data is routinely analyzed for use in variant interpretation for all genes.   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 with above-mentioned history of the right breast having undergone right mastectomy, currently on chemotherapy with dose dense Adriamycin and Cytoxan. She presents to the clinic today for treatment.  After last cycle of chemo, she did much better with the bone pain.  She took Motrin for 2 days and that seemed to help with the pain.  Did not have any nausea or vomiting.  She continues to have eczema on her thighs.  Upper respiratory symptoms have improved.  Her blood sugars are also under good control.  ALLERGIES:  is allergic to betadine [povidone iodine] and tape.  MEDICATIONS:  Current Outpatient Medications  Medication Sig Dispense Refill    Empagliflozin-metFORMIN HCl ER (SYNJARDY XR) 09-999 MG TB24 Take by mouth.     levofloxacin (LEVAQUIN) 500 MG tablet Take 1 tablet (500 mg total) by mouth daily. 7 tablet 0   lidocaine-prilocaine (EMLA) cream Apply to affected area once 30 g 3   LINAGLIPTIN PO Take by mouth.     ondansetron (ZOFRAN) 8 MG tablet Take 1 tablet (8 mg total) by mouth 2 (two) times daily as needed. Start on the third day after chemotherapy. 30 tablet 1   oxyCODONE-acetaminophen (PERCOCET/ROXICET) 5-325 MG tablet Take 1 tablet by mouth every 8 (eight) hours as needed for severe pain. 30 tablet 0   prochlorperazine (COMPAZINE) 10 MG tablet Take 1 tablet (10 mg total) by mouth every 6 (six) hours as needed (Nausea or vomiting). 30 tablet 1   sitaGLIPtin (JANUVIA) 100 MG tablet Take 100 mg by mouth daily.     No current facility-administered medications for this visit.    PHYSICAL EXAMINATION: ECOG PERFORMANCE STATUS: 1 - Symptomatic but completely ambulatory  Vitals:   09/21/21 1054  BP: 107/66  Pulse: 89  Resp: 17  Temp: 98.4 F (36.9 C)  SpO2: 100%   Filed Weights   09/21/21 1054  Weight: 152 lb 12.8 oz (69.3 kg)    LABORATORY DATA:  I have reviewed the data as listed CMP Latest Ref Rng & Units 09/08/2021 09/01/2021 08/25/2021  Glucose 70 - 99 mg/dL 164(H) 118(H) 233(H)  BUN 6 - 20 mg/dL _0 Creatinine 0.44 - 1.00 mg/dL 0.64 0.62 0.68  Sodium 135 - 145 mmol/L 141 136 140  Potassium 3.5 - 5.1 mmol/L 3.7 3.7 3.6  Chloride 98 - 111 mmol/L 106 101 104  CO2 22 - 32 mmol/L 21(L) 23 22  Calcium 8.9 - 10.3 mg/dL 9.2 10.0 9.0  Total Protein 6.5 - 8.1 g/dL 6.5 7.4 6.8  Total Bilirubin 0.3 - 1.2 mg/dL 0.3 0.7 0.2(L)  Alkaline Phos 38 - 126 U/L 81 84 59  AST 15 - 41 U/L 20 14(L) 14(L)  ALT 0 - 44 U/L _1 Lab Results  Component Value Date   WBC 8.0 09/21/2021   HGB 12.6 09/21/2021   HCT 38.4 09/21/2021   MCV 79.8 (L) 09/21/2021   PLT 189 09/21/2021   NEUTROABS 5.2 09/21/2021     ASSESSMENT & PLAN:  Malignant neoplasm of overlapping sites of right breast in female, estrogen receptor positive ([place]) 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  Adjuvant chemotherapy with dose dense Adriamycin and Cytoxan x4 followed by Taxol weekly x12 started 08/25/2021 2. adjuvant radiation therapy 3.  Followed by adjuvant antiestrogen therapy with complete estrogen blockade and abemaciclib ------------------------------------------------------------------------------------------------------------------------ Current treatment: Cycle 3 dose dense Adriamycin and Cytoxan 08/18/2021: Echocardiogram: EF 55 to 60%   Chemo toxicities: 1.  Severe fatigue 2. severe upper respiratory infection/bronchitis/sinus congestion: Resolved 3.  Severe bone pain due to Neulasta: She did much better with taking Motrin.  Skin itching: Eczema on both her thighs, I encouraged  her to take triamcinolone   Diabetes: Appears to be under reasonable control.  We are giving her Decadron 4 mg with each treatment. We would like to repeat the prognostic panel on the final pathology as well as a lymph nodes.   Return to clinic in 2 weeks for cycle 4.    No orders of the defined types were placed in this encounter.  The patient has a good understanding of the overall plan. she agrees with it. she will call with any problems that may develop before the next visit here.  Total time spent: 30 mins including face to face time and time spent for planning, charting and coordination of care  Rulon Eisenmenger, MD, MPH 09/21/2021  I, Thana Ates, am acting as scribe for Dr. [name].  I have reviewed the above documentation for accuracy and  completeness, and I agree with the above.

## 2021-09-21 ENCOUNTER — Inpatient Hospital Stay: Payer: 59

## 2021-09-21 ENCOUNTER — Other Ambulatory Visit: Payer: Self-pay

## 2021-09-21 ENCOUNTER — Encounter: Payer: Self-pay | Admitting: *Deleted

## 2021-09-21 ENCOUNTER — Inpatient Hospital Stay (HOSPITAL_BASED_OUTPATIENT_CLINIC_OR_DEPARTMENT_OTHER): Payer: 59 | Admitting: Hematology and Oncology

## 2021-09-21 VITALS — BP 107/66 | HR 89 | Temp 98.4°F | Resp 17 | Ht 64.0 in | Wt 152.8 lb

## 2021-09-21 DIAGNOSIS — C50811 Malignant neoplasm of overlapping sites of right female breast: Secondary | ICD-10-CM

## 2021-09-21 DIAGNOSIS — Z170 Estrogen receptor positive status [ER+]: Secondary | ICD-10-CM | POA: Diagnosis not present

## 2021-09-21 DIAGNOSIS — Z95828 Presence of other vascular implants and grafts: Secondary | ICD-10-CM

## 2021-09-21 LAB — CBC WITH DIFFERENTIAL (CANCER CENTER ONLY)
Abs Immature Granulocytes: 0.38 10*3/uL — ABNORMAL HIGH (ref 0.00–0.07)
Basophils Absolute: 0.1 10*3/uL (ref 0.0–0.1)
Basophils Relative: 1 %
Eosinophils Absolute: 0.1 10*3/uL (ref 0.0–0.5)
Eosinophils Relative: 1 %
HCT: 38.4 % (ref 36.0–46.0)
Hemoglobin: 12.6 g/dL (ref 12.0–15.0)
Immature Granulocytes: 5 %
Lymphocytes Relative: 21 %
Lymphs Abs: 1.7 10*3/uL (ref 0.7–4.0)
MCH: 26.2 pg (ref 26.0–34.0)
MCHC: 32.8 g/dL (ref 30.0–36.0)
MCV: 79.8 fL — ABNORMAL LOW (ref 80.0–100.0)
Monocytes Absolute: 0.6 10*3/uL (ref 0.1–1.0)
Monocytes Relative: 8 %
Neutro Abs: 5.2 10*3/uL (ref 1.7–7.7)
Neutrophils Relative %: 64 %
Platelet Count: 189 10*3/uL (ref 150–400)
RBC: 4.81 MIL/uL (ref 3.87–5.11)
RDW: 14.7 % (ref 11.5–15.5)
WBC Count: 8 10*3/uL (ref 4.0–10.5)
nRBC: 0 % (ref 0.0–0.2)

## 2021-09-21 LAB — CMP (CANCER CENTER ONLY)
ALT: 31 U/L (ref 0–44)
AST: 27 U/L (ref 15–41)
Albumin: 4.2 g/dL (ref 3.5–5.0)
Alkaline Phosphatase: 78 U/L (ref 38–126)
Anion gap: 11 (ref 5–15)
BUN: 10 mg/dL (ref 6–20)
CO2: 24 mmol/L (ref 22–32)
Calcium: 9.5 mg/dL (ref 8.9–10.3)
Chloride: 100 mmol/L (ref 98–111)
Creatinine: 0.34 mg/dL — ABNORMAL LOW (ref 0.44–1.00)
GFR, Estimated: >60 mL/min (ref >60)
Glucose, Bld: 135 mg/dL — ABNORMAL HIGH (ref 70–99)
Potassium: 4.1 mmol/L (ref 3.5–5.1)
Sodium: 135 mmol/L (ref 135–145)
Total Bilirubin: 0.6 mg/dL (ref 0.3–1.2)
Total Protein: 6.8 g/dL (ref 6.5–8.1)

## 2021-09-21 MED ORDER — SODIUM CHLORIDE 0.9% FLUSH
10.0000 mL | Freq: Once | INTRAVENOUS | Status: AC
  Administered 2021-09-21: 10 mL

## 2021-09-21 MED ORDER — HEPARIN SOD (PORK) LOCK FLUSH 100 UNIT/ML IV SOLN
500.0000 [IU] | Freq: Once | INTRAVENOUS | Status: AC
  Administered 2021-09-21: 500 [IU]

## 2021-09-21 MED FILL — Fosaprepitant Dimeglumine For IV Infusion 150 MG (Base Eq): INTRAVENOUS | Qty: 5 | Status: AC

## 2021-09-21 NOTE — Assessment & Plan Note (Signed)
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, margins negative, 4/7 lymph nodes, ER 75 to 95%, PR 9095%, HER2 negative, Ki-67 25% Genetics: Negative MammaPrint: Low risk (this is not a valid test for 4+ lymph nodes)  Treatment plan: 1.Adjuvant chemotherapy with dose dense Adriamycin and Cytoxan x4 followed by Taxol weekly x12started 08/25/2021 2.adjuvant radiation therapy 3.Followed by adjuvant antiestrogen therapy with complete estrogen blockade and abemaciclib ------------------------------------------------------------------------------------------------------------------------ Current treatment: Cycle 3dose dense Adriamycin and Cytoxan 08/18/2021: Echocardiogram: EF 55 to 60%  Chemo toxicities: 1.Severe fatigue 2.severe upper respiratory infection/bronchitis/sinus congestion:  3.  Severe bone pain due to Neulasta:Percocets to be taken as needed   Skin itching: Improved  Diabetes: Appears to be under reasonable control.  We are giving her Decadron 4 mg with each treatment.   Return to clinic in 2 weeks for cycle 4.

## 2021-09-22 ENCOUNTER — Inpatient Hospital Stay: Payer: 59 | Admitting: Hematology and Oncology

## 2021-09-22 ENCOUNTER — Inpatient Hospital Stay: Payer: 59

## 2021-09-22 VITALS — BP 101/69 | HR 93 | Temp 98.0°F | Resp 16

## 2021-09-22 DIAGNOSIS — C50811 Malignant neoplasm of overlapping sites of right female breast: Secondary | ICD-10-CM | POA: Diagnosis not present

## 2021-09-22 MED ORDER — SODIUM CHLORIDE 0.9 % IV SOLN
600.0000 mg/m2 | Freq: Once | INTRAVENOUS | Status: AC
  Administered 2021-09-22: 1080 mg via INTRAVENOUS
  Filled 2021-09-22: qty 54

## 2021-09-22 MED ORDER — PALONOSETRON HCL INJECTION 0.25 MG/5ML
0.2500 mg | Freq: Once | INTRAVENOUS | Status: AC
  Administered 2021-09-22: 0.25 mg via INTRAVENOUS
  Filled 2021-09-22: qty 5

## 2021-09-22 MED ORDER — SODIUM CHLORIDE 0.9 % IV SOLN
150.0000 mg | Freq: Once | INTRAVENOUS | Status: AC
  Administered 2021-09-22: 150 mg via INTRAVENOUS
  Filled 2021-09-22: qty 150

## 2021-09-22 MED ORDER — SODIUM CHLORIDE 0.9% FLUSH
10.0000 mL | INTRAVENOUS | Status: DC | PRN
  Administered 2021-09-22: 10 mL

## 2021-09-22 MED ORDER — SODIUM CHLORIDE 0.9 % IV SOLN: Freq: Once | INTRAVENOUS | Status: AC

## 2021-09-22 MED ORDER — HEPARIN SOD (PORK) LOCK FLUSH 100 UNIT/ML IV SOLN
500.0000 [IU] | Freq: Once | INTRAVENOUS | Status: AC | PRN
  Administered 2021-09-22: 500 [IU]

## 2021-09-22 MED ORDER — DOXORUBICIN HCL CHEMO IV INJECTION 2 MG/ML
60.0000 mg/m2 | Freq: Once | INTRAVENOUS | Status: AC
  Administered 2021-09-22: 108 mg via INTRAVENOUS
  Filled 2021-09-22: qty 54

## 2021-09-22 MED ORDER — DEXAMETHASONE SODIUM PHOSPHATE 10 MG/ML IJ SOLN
4.0000 mg | Freq: Once | INTRAMUSCULAR | Status: AC
  Administered 2021-09-22: 4 mg via INTRAVENOUS
  Filled 2021-09-22: qty 1

## 2021-09-22 NOTE — Patient Instructions (Signed)
[facility]  Discharge Instructions: °Thank you for choosing [facility] to provide your oncology and hematology care.  ° °If you have a lab appointment with the Cancer Center, please go directly to the Cancer Center and check in at the registration area. °  °Wear comfortable clothing and clothing appropriate for easy access to any Portacath or PICC line.  ° °We strive to give you quality time with your provider. You may need to reschedule your appointment if you arrive late (15 or more minutes).  Arriving late affects you and other patients whose appointments are after yours.  Also, if you miss three or more appointments without notifying the office, you may be dismissed from the clinic at the provider’s discretion.    °  °For prescription refill requests, have your pharmacy contact our office and allow 72 hours for refills to be completed.   ° °Today you received the following chemotherapy and/or immunotherapy agents: doxorubicin and  cyclophosphamide   °  °To help prevent nausea and vomiting after your treatment, we encourage you to take your nausea medication as directed. ° °BELOW ARE SYMPTOMS THAT SHOULD BE REPORTED IMMEDIATELY: °*FEVER GREATER THAN 100.4 F (38 °C) OR HIGHER °*CHILLS OR SWEATING °*NAUSEA AND VOMITING THAT IS NOT CONTROLLED WITH YOUR NAUSEA MEDICATION °*UNUSUAL SHORTNESS OF BREATH °*UNUSUAL BRUISING OR BLEEDING °*URINARY PROBLEMS (pain or burning when urinating, or frequent urination) °*BOWEL PROBLEMS (unusual diarrhea, constipation, pain near the anus) °TENDERNESS IN MOUTH AND THROAT WITH OR WITHOUT PRESENCE OF ULCERS (sore throat, sores in mouth, or a toothache) °UNUSUAL RASH, SWELLING OR PAIN  °UNUSUAL VAGINAL DISCHARGE OR ITCHING  ° °Items with * indicate a potential emergency and should be followed up as soon as possible or go to the Emergency Department if any problems should occur. ° °Please show the CHEMOTHERAPY ALERT CARD or IMMUNOTHERAPY  ALERT CARD at check-in to the Emergency Department and triage nurse. ° °Should you have questions after your visit or need to cancel or reschedule your appointment, please contact [facility]  Dept: [phone]  and follow the prompts.  Office hours are 8:00 a.m. to 4:30 p.m. Monday - Friday. Please note that voicemails left after 4:00 p.m. may not be returned until the following business day.  We are closed weekends and major holidays. You have access to a nurse at all times for urgent questions. Please call the main number to the clinic Dept: [phone] and follow the prompts. ° ° °For any non-urgent questions, you may also contact your provider using MyChart. We now offer e-Visits for anyone 18 and older to request care online for non-urgent symptoms. For details visit [email or web address]. °  °Also download the MyChart app! Go to the app store, search "MyChart", open the app, select [facility], and log in with your MyChart username and password. ° °Due to Covid, a mask is required upon entering the hospital/clinic. If you do not have a mask, one will be given to you upon arrival. For doctor visits, patients may have 1 support person aged 18 or older with them. For treatment visits, patients cannot have anyone with them due to current Covid guidelines and our immunocompromised population.  ° °

## 2021-09-23 ENCOUNTER — Other Ambulatory Visit: Payer: Self-pay

## 2021-09-23 ENCOUNTER — Inpatient Hospital Stay: Payer: 59

## 2021-09-23 VITALS — BP 90/66 | HR 89 | Temp 97.9°F | Resp 20

## 2021-09-23 DIAGNOSIS — C50811 Malignant neoplasm of overlapping sites of right female breast: Secondary | ICD-10-CM

## 2021-09-23 DIAGNOSIS — Z170 Estrogen receptor positive status [ER+]: Secondary | ICD-10-CM

## 2021-09-23 MED ORDER — PEGFILGRASTIM-BMEZ 6 MG/0.6ML ~~LOC~~ SOSY
6.0000 mg | PREFILLED_SYRINGE | Freq: Once | SUBCUTANEOUS | Status: AC
  Administered 2021-09-23: 6 mg via SUBCUTANEOUS

## 2021-09-23 NOTE — Patient Instructions (Signed)

## 2021-09-26 ENCOUNTER — Telehealth: Payer: Self-pay

## 2021-09-26 NOTE — Telephone Encounter (Signed)
Patient notified of completion of Disability Forms for [employer]. Fax Transmission Confirmation received and copy mailed to Patient as requested.

## 2021-09-28 LAB — SURGICAL PATHOLOGY

## 2021-10-03 ENCOUNTER — Ambulatory Visit: Payer: 59

## 2021-10-03 ENCOUNTER — Other Ambulatory Visit: Payer: Self-pay

## 2021-10-03 DIAGNOSIS — M25611 Stiffness of right shoulder, not elsewhere classified: Secondary | ICD-10-CM

## 2021-10-03 DIAGNOSIS — R293 Abnormal posture: Secondary | ICD-10-CM

## 2021-10-03 DIAGNOSIS — C50811 Malignant neoplasm of overlapping sites of right female breast: Secondary | ICD-10-CM

## 2021-10-03 NOTE — Therapy (Signed)
[facility] @ [address] [place], [place], [address] Phone: [phone]   Fax:  [phone]  Physical Therapy Treatment  Patient Details  Name: Allison Reed MRN: [id number] Date of Birth: [birth year] Referring Provider (PT): Dr. Donne Hazel   Encounter Date: 10/03/2021   PT End of Session - 10/03/21 1451     Visit Number 3    Number of Visits 10    Date for PT Re-Evaluation 11/14/21    PT Start Time 0017    PT Stop Time 1451    PT Time Calculation (min) 46 min    Activity Tolerance Patient tolerated treatment well    Behavior During Therapy [facility] for tasks assessed/performed             Past Medical History:  Diagnosis Date   Diabetes mellitus without complication ([place])    Type 2   Family history of brain tumor    Family history of skin cancer    Gestational diabetes    Headache    otc med prn   Heartburn in pregnancy    Incompetent cervix in pregnancy 04/2015   Missed abortion    no surgery required   Postpartum care following cesarean delivery (11/2) 10/12/2015   right breast ca 06/2021   UTI in pregnancy 03/2015   recent UTI, treatment completed    Past Surgical History:  Procedure Laterality Date   APPENDECTOMY  2006   BREAST RECONSTRUCTION WITH PLACEMENT OF TISSUE EXPANDER AND ALLODERM Right 07/25/2021   Procedure: RIGHT BREAST RECONSTRUCTION WITH PLACEMENT OF TISSUE EXPANDER AND ALLODERM;  Surgeon: Irene Limbo, MD;  Location: [facility];  Service: Plastics;  Laterality: Right;   CERCLAGE LAPAROSCOPIC ABDOMINAL N/A 04/15/2015   Procedure: LAPAROSCOPIC TRANSABDOMINAL CERVCOISTHMIC CERCLAGE;  Surgeon: Governor Specking, MD;  Location: [facility];  Service: Gynecology;  Laterality: N/A;   CERVICAL CERCLAGE  02/2011   vaginal   CESAREAN SECTION N/A 10/12/2015   Procedure: CESAREAN SECTION;  Surgeon: Princess Bruins, MD;  Location: [facility];  Service: Obstetrics;  Laterality: N/A;   CESAREAN  SECTION N/A 03/03/2018   Procedure: Repeat CESAREAN SECTION/Removal of Abdominal Cerclage;  Surgeon: Azucena Fallen, MD;  [facility];  Service: Obstetrics;  Laterality: N/A;  EDD: 03/28/18   MASTECTOMY W/ SENTINEL NODE BIOPSY Right 07/25/2021   Procedure: RIGHT MASTECTOMY WITH AXILLARY SENTINEL LYMPH NODE BIOPSY;  Surgeon: [name], MD;  Location: [facility];  Service: General;  Laterality: Right;   PORTACATH PLACEMENT N/A 08/24/2021   Procedure: INSERTION PORT-A-CATH;  Surgeon: [name], MD;  Location: [facility];  Service: General;  Laterality: N/A;   RADIOACTIVE SEED GUIDED AXILLARY SENTINEL LYMPH NODE Right 07/25/2021   Procedure: RADIOACTIVE SEED GUIDED RIGHT AXILLARY NODE EXCISION;  Surgeon: [name], MD;  Location: [facility];  Service: General;  Laterality: Right;   [place] EXTRACTION  2001    There were no vitals filed for this visit.   Subjective Assessment - 10/03/21 1407     Subjective The chemo has me staying very tired overall.  I have no energy. The cording is still present and I have done some stretches at home. I still feel uncomfortable from the expanders and the portacath.    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 She noted a palpable right breast mass. This is followed by a mammogram with a mass with calcifications as really throughout the upper outer  quadrant and in the central breast. This measures 9.4 x 8.7 x 6.7 cm. On ultrasound she had 3 separate masses at 10, 11, and 12:00. The sizes are 1.7, 2.1, and 2.2 cm. There is also a single abnormal axillary lymph node that has undergone biopsy. 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 She is s/p right breast mastectomy with SLNB 0/7 LN on August 16 andl also had tissue expanders in place.  She is currently undergoing chemotherapy for 6 months every other week and radiation will follow    Patient Stated Goals Reassess after surgery    Currently in Pain? No/denies    Multiple Pain Sites No                               [facility] Adult PT Treatment/Exercise - 10/03/21 0001       Shoulder Exercises: Supine   Other Supine Exercises AROM supine flexion, scaption, horizontal abd x 5 ea      Shoulder Exercises: Pulleys   Flexion 2 minutes    Scaption 2 minutes    ABduction 2 minutes      Manual Therapy   Manual Therapy Soft tissue mobilization;Myofascial release;Passive ROM    Soft tissue mobilization Right pecs, lats in supine and scapular area/UT/levator in left SL with cocobutter.    Myofascial Release MFR to richt axilla and medial arm cording    Passive ROM PROM righ shoulder flexion, scaption, abd, ER                          PT Long Term Goals - 09/19/21 1010       PT LONG TERM GOAL #1   Title Pt will achieve full right shoulder ROM post surgery for full return to daily activities    Time 8    Period Weeks    Status New    Target Date 11/14/21      PT LONG TERM GOAL #2   Title Pt will improve right shoulder flexion and scaption by atleast 15 degrees    Baseline flexion 140,  abd 150    Time 4    Period Weeks    Status New    Target Date 10/17/21      PT LONG TERM GOAL #3   Title Pt will report decrease achiness and pulling from cording by 50% or more    Time 8    Period Weeks    Status New    Target Date 11/14/21      PT LONG TERM GOAL #4   Title Pt will be able to perform arm movements to continue with her indian dancing    Time 8    Period Weeks    Status New    Target Date 11/14/21                    Plan - 10/03/21 1452     Clinical Impression Statement Pt continues to be bothered by tightness/discomfort in right axillary and medial arm regions secondary to cording.  She did well with pulleys and overall  shoulder ROM is improved. She is tight and tender in right pecs and lats. She thinks she will purchase pulleys for home use. She is getting very fatigued with chemo.    Personal Factors and Comorbidities Comorbidity 1    Comorbidities right breast cancer pending mastectomy and radiation    Stability/Clinical Decision Making Stable/Uncomplicated    Rehab Potential Excellent    PT Frequency 2x / week    PT Duration 8 weeks    PT Treatment/Interventions ADLs/Self Care Home Management;Therapeutic exercise;Manual techniques;Patient/family education;Manual lymph drainage;Scar mobilization;Passive range of motion    PT Next Visit Plan MFR to right axilla/medial arm cording,  AAROM,PROM, AROM, progress to strength as tolerated    PT Home Exercise Plan 4 post op exs , supine wand flexion and scaption    Consulted and Agree with Plan of Care Patient             Patient will benefit from skilled therapeutic intervention in order to improve the following deficits and impairments:  Postural dysfunction, Decreased knowledge of precautions, Decreased scar mobility, Increased fascial restricitons, Decreased range of motion  Visit Diagnosis: Malignant neoplasm of overlapping sites of right breast in female, estrogen receptor positive (H[facility])  Abnormal posture  Stiffness of right shoulder, not elsewhere classified     Problem List Patient Active Problem List   Diagnosis Date Noted   Port-A-Cath in place 08/25/2021   S/P mastectomy, right 07/25/2021   Genetic testing 07/01/2021   Cancer ([place]) 06/26/2021   Family history of skin cancer 06/22/2021   Family history of brain tumor 06/22/2021   Malignant neoplasm of overlapping sites of right breast in female, estrogen receptor  positive ([place]) 06/21/2021   Premature uterine contractions causing threatened premature labor in third trimester 03/03/2018   Previous cesarean delivery, delivered 03/03/2018   Postpartum care following cesarean delivery (3/25) 03/03/2018   Status post repeat low transverse cesarean section 03/03/2018   DM (diabetes mellitus) in pregnancy 03/02/2018   Incompetent cervix in pregnancy 03/02/2018   Premature uterine contractions in third trimester, antepartum 03/02/2018   Active labor 10/12/2015    Claris Pong, PT 10/03/2021, 2:55 PM  [facility] @ [address], [place], [address] Phone: [phone]   Fax:  [phone]  Name: Allison Reed MRN: [id number] Date of Birth: 09/18/1976

## 2021-10-04 NOTE — Progress Notes (Signed)
Patient Care Team: [name], MD as PCP - General (Endocrinology) Mauro Kaufmann, RN as Oncology Nurse Navigator Rockwell Germany, RN as Oncology Nurse Navigator  DIAGNOSIS:    ICD-10-CM   1. Malignant neoplasm of overlapping sites of right breast in female, estrogen receptor positive ([place])  C50.811    Z17.0       SUMMARY OF ONCOLOGIC HISTORY: Oncology History Overview Note  Ductal carcinoma in situ of the right breast  She palpated a lump in her right breast. Diagnostic mammogram and [place] on 06/08/21 showed irregular hypoechoic lesions in the right breast with a single abnormal right axillary lymph node without normal cortex. Biopsy on 06/16/21 showed high grade DCIS with calcifications and necrosis.   Malignant neoplasm of overlapping sites of right breast in female, estrogen receptor positive ([place])  06/16/2021 Initial Diagnosis   Palpable right breast mass: Breast MRI revealed extensive involvement of the right breast with mass and non-mass enhancement superior right breast mass measures 4.9 x 1.2 cm and together with non-mass enhancement measured 8.8 cm, enhancing mass LOQ 1.3 cm, left breast indeterminate 0.9 cm mass and a 0.8 cm mass UOQ, single abnormal lymph node Biopsy 10:00, 11:00 and 12:00: IDC with DCIS, biopsy right axillary lymph node: IDC, ER 75 to 95%, PR 90 to 95%, Ki-67 25%, HER2 negative on 1 biopsy and 2+ by IHC and FISH pending   06/21/2021 Cancer Staging   Staging form: Breast, AJCC 8th Edition - Clinical stage from 06/21/2021: Stage IIA (cT3, cN1, cM0, G2, ER+, PR+, HER2-) - Signed by [name], MD on 06/21/2021 Histologic grading system: 3 grade system    07/01/2021 Genetic Testing   Negative genetic testing:  No pathogenic variants detected on the [employer] BRCAplus panel (report date 07/01/2021) or the [employer] CancerNext-Expanded + RNAinsight panel (report date 07/01/2021).   The BRCAplus panel offered by [employer] and includes sequencing and  deletion/duplication analysis for the following 8 genes: ATM, BRCA1, BRCA2, CDH1, CHEK2, PALB2, PTEN, and TP53. The CancerNext-Expanded + RNAinsight gene panel offered by [employer] and includes sequencing and rearrangement analysis for the following 77 genes: AIP, ALK, APC, ATM, AXIN2, BAP1, BARD1, BLM, BMPR1A, BRCA1, BRCA2, BRIP1, CDC73, CDH1, CDK4, CDKN1B, CDKN2A, CHEK2, CTNNA1, DICER1, FANCC, FH, FLCN, GALNT12, KIF1B, LZTR1, MAX, MEN1, MET, MLH1, MSH2, MSH3, MSH6, MUTYH, NBN, NF1, NF2, NTHL1, PALB2, PHOX2B, PMS2, POT1, PRKAR1A, PTCH1, PTEN, RAD51C, RAD51D, RB1, RECQL, RET, SDHA, SDHAF2, SDHB, SDHC, SDHD, SMAD4, SMARCA4, SMARCB1, SMARCE1, STK11, SUFU, TMEM127, TP53, TSC1, TSC2, VHL and XRCC2 (sequencing and deletion/duplication); EGFR, EGLN1, HOXB13, KIT, MITF, PDGFRA, POLD1 and POLE (sequencing only); EPCAM and GREM1 (deletion/duplication only). RNA data is routinely analyzed for use in variant interpretation for all genes.   07/25/2021 Surgery   Right mastectomy: Foci of invasive ductal carcinoma measuring 1.1 cm grade 2 with extensive DCIS spanning 8.9 cm, margins negative, 4/7 lymph nodes, ER 75 to 95%, PR 9095%, HER2 negative, Ki-67 25%   08/25/2021 -  Chemotherapy   Patient is on Treatment Plan : BREAST ADJUVANT DOSE DENSE AC q14d / PACLitaxel q7d       CHIEF COMPLIANT: Cycle 4 dose dense Adriamycin and Cytoxan (chemo will be tomorrow)  INTERVAL HISTORY: Allison Reed is a 45 y.o. with above-mentioned history of  the right breast having undergone right mastectomy, currently on chemotherapy with dose dense Adriamycin and Cytoxan. She presents to the clinic today for treatment.  Overall she did much better with the last cycle of chemo.  She did  not have any nausea or vomiting.  She was drinking lots of coconut water which appears to help the urinary symptoms.  Did not have any upper respiratory infection.  Bone pain was much better this time around.  She took Claritin more early on and took  Motrin if necessary.  Did not use any antiemetics.  Bowels are moving well.  Taste and appetite are reasonable.  She does blood sugars have been running slightly higher but she will watch and monitor them closely.  ALLERGIES:  is allergic to betadine [povidone iodine] and tape.  MEDICATIONS:  Current Outpatient Medications  Medication Sig Dispense Refill   Empagliflozin-metFORMIN HCl ER (SYNJARDY XR) 09-999 MG TB24 Take by mouth.     lidocaine-prilocaine (EMLA) cream Apply to affected area once 30 g 3   LINAGLIPTIN PO Take by mouth.     ondansetron (ZOFRAN) 8 MG tablet Take 1 tablet (8 mg total) by mouth 2 (two) times daily as needed. Start on the third day after chemotherapy. 30 tablet 1   prochlorperazine (COMPAZINE) 10 MG tablet Take 1 tablet (10 mg total) by mouth every 6 (six) hours as needed (Nausea or vomiting). 30 tablet 1   sitaGLIPtin (JANUVIA) 100 MG tablet Take 100 mg by mouth daily.     No current facility-administered medications for this visit.    PHYSICAL EXAMINATION: ECOG PERFORMANCE STATUS: 1 - Symptomatic but completely ambulatory  Vitals:   10/06/21 0809  BP: 99/62  Pulse: 87  Resp: 18  Temp: 97.7 F (36.5 C)  SpO2: 100%   Filed Weights   10/06/21 0809  Weight: 155 lb (70.3 kg)    LABORATORY DATA:  I have reviewed the data as listed CMP Latest Ref Rng & Units 10/06/2021 09/21/2021 09/08/2021  Glucose 70 - 99 mg/dL 181(H) 135(H) 164(H)  BUN 6 - 20 mg/dL _0 Creatinine 0.44 - 1.00 mg/dL 0.64 0.34(L) 0.64  Sodium 135 - 145 mmol/L 136 135 141  Potassium 3.5 - 5.1 mmol/L 4.2 4.1 3.7  Chloride 98 - 111 mmol/L 103 100 106  CO2 22 - 32 mmol/L 22 24 21(L)  Calcium 8.9 - 10.3 mg/dL 9.0 9.5 9.2  Total Protein 6.5 - 8.1 g/dL 6.7 6.8 6.5  Total Bilirubin 0.3 - 1.2 mg/dL 0.4 0.6 0.3  Alkaline Phos 38 - 126 U/L 85 78 81  AST 15 - 41 U/L _1 ALT 0 - 44 U/L _2 Lab Results  Component Value Date   WBC 6.4 10/06/2021   HGB 12.0 10/06/2021    HCT 36.6 10/06/2021   MCV 81.0 10/06/2021   PLT 193 10/06/2021   NEUTROABS 4.3 10/06/2021    ASSESSMENT & PLAN:  Malignant neoplasm of overlapping sites of right breast in female, estrogen receptor positive ([place]) 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  Adjuvant chemotherapy with dose dense Adriamycin and Cytoxan x4 followed by Taxol weekly x12 started 08/25/2021 2. adjuvant radiation therapy 3.  Followed by adjuvant antiestrogen therapy with complete estrogen blockade and abemaciclib ------------------------------------------------------------------------------------------------------------------------ Current treatment: Cycle 4 dose dense Adriamycin and Cytoxan 08/18/2021: Echocardiogram: EF 55 to 60%   Chemo toxicities: 1.  Severe fatigue 2. severe upper respiratory infection/bronchitis/sinus congestion: Resolved  3.  Severe bone pain due to Neulasta: She did much better with taking Motrin.    Skin itching: Eczema on both her thighs: Resolved  Diabetes: Appears to be under reasonable control.  We are giving her Decadron 4 mg with each treatment. Repeat prognostic panel on the lymph node in the final pathology also came back as ER/PR positive   Return to clinic in 2 weeks for cycle 1 Taxol    No orders of the defined types were placed in this encounter.  The patient has a good understanding of the overall plan. she agrees with it. she will call with any problems that may develop before the next visit here.  Total time spent: 30 mins including face to face time and time spent for planning, charting and coordination of care  Rulon Eisenmenger, MD, MPH 10/06/2021  I, Thana Ates, am acting as scribe for Dr.  [name].  I have reviewed the above documentation for accuracy and completeness, and I agree with the above.

## 2021-10-05 ENCOUNTER — Other Ambulatory Visit: Payer: Self-pay

## 2021-10-05 ENCOUNTER — Ambulatory Visit: Payer: 59

## 2021-10-05 DIAGNOSIS — C50811 Malignant neoplasm of overlapping sites of right female breast: Secondary | ICD-10-CM | POA: Diagnosis not present

## 2021-10-05 DIAGNOSIS — M25611 Stiffness of right shoulder, not elsewhere classified: Secondary | ICD-10-CM

## 2021-10-05 DIAGNOSIS — Z170 Estrogen receptor positive status [ER+]: Secondary | ICD-10-CM

## 2021-10-05 DIAGNOSIS — R293 Abnormal posture: Secondary | ICD-10-CM

## 2021-10-05 MED FILL — Fosaprepitant Dimeglumine For IV Infusion 150 MG (Base Eq): INTRAVENOUS | Qty: 5 | Status: AC

## 2021-10-05 NOTE — Therapy (Signed)
[facility] @ [place], [place], [address] Phone: [phone]   Fax:  [phone]  Physical Therapy Treatment  Patient Details  Name: Allison Reed MRN: [id number] Date of Birth: 04-14-1976 Referring Provider (PT): Dr. Donne Hazel   Encounter Date: 10/05/2021   PT End of Session - 10/05/21 0940     Visit Number 4    Number of Visits 10    Date for PT Re-Evaluation 11/14/21    PT Start Time 0905    PT Stop Time 0951    PT Time Calculation (min) 46 min    Activity Tolerance Patient tolerated treatment well    Behavior During Therapy [facility] for tasks assessed/performed             Past Medical History:  Diagnosis Date   Diabetes mellitus without complication ([place])    Type 2   Family history of brain tumor    Family history of skin cancer    Gestational diabetes    Headache    otc med prn   Heartburn in pregnancy    Incompetent cervix in pregnancy 04/2015   Missed abortion    no surgery required   Postpartum care following cesarean delivery (11/2) 10/12/2015   right breast ca 06/2021   UTI in pregnancy 03/2015   recent UTI, treatment completed    Past Surgical History:  Procedure Laterality Date   APPENDECTOMY  2006   BREAST RECONSTRUCTION WITH PLACEMENT OF TISSUE EXPANDER AND ALLODERM Right 07/25/2021   Procedure: RIGHT BREAST RECONSTRUCTION WITH PLACEMENT OF TISSUE EXPANDER AND ALLODERM;  Surgeon: Irene Limbo, MD;  Location: [facility];  Service: Plastics;  Laterality: Right;   CERCLAGE LAPAROSCOPIC ABDOMINAL N/A 04/15/2015   Procedure: LAPAROSCOPIC TRANSABDOMINAL CERVCOISTHMIC CERCLAGE;  Surgeon: Governor Specking, MD;  Location: [facility];  Service: Gynecology;  Laterality: N/A;   CERVICAL CERCLAGE  02/2011   vaginal   CESAREAN SECTION N/A 10/12/2015   Procedure: CESAREAN SECTION;  Surgeon: Princess Bruins, MD;  Location: [facility];  Service: Obstetrics;  Laterality: N/A;   CESAREAN  SECTION N/A 03/03/2018   Procedure: Repeat CESAREAN SECTION/Removal of Abdominal Cerclage;  Surgeon: Azucena Fallen, MD;  Location: [facility];  Service: Obstetrics;  Laterality: N/A;  EDD: 03/28/18   MASTECTOMY W/ SENTINEL NODE BIOPSY Right 07/25/2021   Procedure: RIGHT MASTECTOMY WITH AXILLARY SENTINEL LYMPH NODE BIOPSY;  Surgeon: [name], MD;  Location: [facility];  Service: General;  Laterality: Right;   PORTACATH PLACEMENT N/A 08/24/2021   Procedure: INSERTION PORT-A-CATH;  Surgeon: [name], MD;  Location: [facility];  Service: General;  Laterality: N/A;   RADIOACTIVE SEED GUIDED AXILLARY SENTINEL LYMPH NODE Right 07/25/2021   Procedure: RADIOACTIVE SEED GUIDED RIGHT AXILLARY NODE EXCISION;  Surgeon: [name], MD;  Location: [facility];  Service: General;  Laterality: Right;   [place] EXTRACTION  2001    There were no vitals filed for this visit.   Subjective Assessment - 10/05/21 0906     Subjective I still have alot of tightness under the arm. I ordered the pulleys but they haven't arrived yet.    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 She noted a palpable right breast mass. This is followed by a mammogram with a mass with calcifications as really throughout the upper outer quadrant and in the central breast. This measures 9.4 x 8.7 x 6.7 cm. On ultrasound she had  3 separate masses at 10, 11, and 12:00. The sizes are 1.7, 2.1, and 2.2 cm. There is also a single abnormal axillary lymph node that has undergone biopsy. 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  She is s/p right breast mastectomy with SLNB 0/7 LN on August 16 andl also had tissue expanders in place.  She is currently undergoing chemotherapy for 6 months every other week and radiation will follow    Patient Stated Goals Reassess after surgery    Currently in Pain? No/denies    Pain Score 0-No pain                               [facility] Adult PT Treatment/Exercise - 10/05/21 0001       Shoulder Exercises: Supine   Horizontal ABduction Strengthening;Both;10 reps    Theraband Level (Shoulder Horizontal ABduction) Level 1 (Yellow)    External Rotation Strengthening;Both;10 reps    Theraband Level (Shoulder External Rotation) Level 1 (Yellow)    Flexion Strengthening;Both;10 reps    Theraband Level (Shoulder Flexion) Level 1 (Yellow)    Other Supine Exercises AROM supine flexion, scaption, horizontal abd x 5 ea      Shoulder Exercises: Standing   Retraction Strengthening;Both;10 reps    Other Standing Exercises pec wall stretch x 2    Other Standing Exercises counter lat stretch x 3      Shoulder Exercises: Pulleys   Flexion 2 minutes    Scaption 1 minute    ABduction 2 minutes      Manual Therapy   Manual Therapy Soft tissue mobilization;Myofascial release;Passive ROM    Soft tissue mobilization Right pecs, lats in supine and scapular area/UT/levator in left SL with cocobutter.    Myofascial Release MFR to right axilla and medial arm cording    Passive ROM PROM right shoulder flexion, scaption, abd, ER                          PT Long Term Goals - 09/19/21 1010       PT LONG TERM GOAL #1   Title Pt will achieve full right shoulder ROM post surgery for full return to daily activities    Time 8    Period Weeks    Status New    Target Date 11/14/21      PT LONG TERM GOAL #2   Title Pt will improve right shoulder flexion and scaption by atleast 15 degrees    Baseline flexion 140,  abd 150    Time 4    Period Weeks    Status New     Target Date 10/17/21      PT LONG TERM GOAL #3   Title Pt will report decrease achiness and pulling from cording by 50% or more    Time 8    Period Weeks    Status New    Target Date 11/14/21      PT LONG TERM GOAL #4   Title Pt will be able to perform arm movements to continue with her [place] dancing    Time 8  Period Weeks    Status New    Target Date 11/14/21                   Plan - 10/05/21 0953     Clinical Impression Statement Continued soft tissue mobilization to pecs, Lats, UT and scapular region and MFR techniques to right axilla, followed by PROM.  Overall ROM improved nicely but continues with tightness in pecs/lats and small cords in axillary region. Pt did very well with theraband exercises and indicated she has been doing some at home given to her after she had a frozen shoulder on the left.    Personal Factors and Comorbidities Comorbidity 1    Comorbidities right breast cancer s/p mastectomy  presently having chemo and pending and radiation    Stability/Clinical Decision Making Stable/Uncomplicated    Rehab Potential Excellent    PT Frequency 2x / week    PT Duration 8 weeks    PT Treatment/Interventions ADLs/Self Care Home Management;Therapeutic exercise;Manual techniques;Patient/family education;Manual lymph drainage;Scar mobilization;Passive range of motion    PT Next Visit Plan MFR to right axilla/medial arm cording,STM  AAROM,PROM, AROM, progress to strength as tolerated, discuss sleeve,open book?wall stabs   Consulted and Agree with Plan of Care Patient             Patient will benefit from skilled therapeutic intervention in order to improve the following deficits and impairments:  Postural dysfunction, Decreased knowledge of precautions, Decreased scar mobility, Increased fascial restricitons, Decreased range of motion  Visit Diagnosis: Malignant neoplasm of overlapping sites of right breast in female, estrogen receptor positive  (HCC)  Abnormal posture  Stiffness of right shoulder, not elsewhere classified     Problem List Patient Active Problem List   Diagnosis Date Noted   Port-A-Cath in place 08/25/2021   S/P mastectomy, right 07/25/2021   Genetic testing 07/01/2021   Cancer ([place]) 06/26/2021   Family history of skin cancer 06/22/2021   Family history of brain tumor 06/22/2021   Malignant neoplasm of overlapping sites of right breast in female, estrogen receptor positive ([place]) 06/21/2021   Premature uterine contractions causing threatened premature labor in third trimester 03/03/2018   Previous cesarean delivery, delivered 03/03/2018   Postpartum care following cesarean delivery (3/25) 03/03/2018   Status post repeat low transverse cesarean section 03/03/2018   DM (diabetes mellitus) in pregnancy 03/02/2018   Incompetent cervix in pregnancy 03/02/2018   Premature uterine contractions in third trimester, antepartum 03/02/2018   Active labor 10/12/2015    Claris Pong, PT 10/05/2021, 9:57 AM  [facility] @ [place], [place], [address] Phone: [phone]   Fax:  [phone]  Name: Allison Reed MRN: [id number] Date of Birth: 1976/01/05

## 2021-10-06 ENCOUNTER — Inpatient Hospital Stay: Payer: 59

## 2021-10-06 ENCOUNTER — Encounter: Payer: Self-pay | Admitting: *Deleted

## 2021-10-06 ENCOUNTER — Inpatient Hospital Stay (HOSPITAL_BASED_OUTPATIENT_CLINIC_OR_DEPARTMENT_OTHER): Payer: 59 | Admitting: Hematology and Oncology

## 2021-10-06 DIAGNOSIS — C50811 Malignant neoplasm of overlapping sites of right female breast: Secondary | ICD-10-CM | POA: Diagnosis not present

## 2021-10-06 DIAGNOSIS — Z170 Estrogen receptor positive status [ER+]: Secondary | ICD-10-CM | POA: Diagnosis not present

## 2021-10-06 DIAGNOSIS — Z95828 Presence of other vascular implants and grafts: Secondary | ICD-10-CM

## 2021-10-06 LAB — CMP (CANCER CENTER ONLY)
ALT: 26 U/L (ref 0–44)
AST: 19 U/L (ref 15–41)
Albumin: 4 g/dL (ref 3.5–5.0)
Alkaline Phosphatase: 85 U/L (ref 38–126)
Anion gap: 11 (ref 5–15)
BUN: 9 mg/dL (ref 6–20)
CO2: 22 mmol/L (ref 22–32)
Calcium: 9 mg/dL (ref 8.9–10.3)
Chloride: 103 mmol/L (ref 98–111)
Creatinine: 0.64 mg/dL (ref 0.44–1.00)
GFR, Estimated: >60 mL/min (ref >60)
Glucose, Bld: 181 mg/dL — ABNORMAL HIGH (ref 70–99)
Potassium: 4.2 mmol/L (ref 3.5–5.1)
Sodium: 136 mmol/L (ref 135–145)
Total Bilirubin: 0.4 mg/dL (ref 0.3–1.2)
Total Protein: 6.7 g/dL (ref 6.5–8.1)

## 2021-10-06 LAB — CBC WITH DIFFERENTIAL (CANCER CENTER ONLY)
Abs Immature Granulocytes: 0.12 10*3/uL — ABNORMAL HIGH (ref 0.00–0.07)
Basophils Absolute: 0.1 10*3/uL (ref 0.0–0.1)
Basophils Relative: 1 %
Eosinophils Absolute: 0.1 10*3/uL (ref 0.0–0.5)
Eosinophils Relative: 1 %
HCT: 36.6 % (ref 36.0–46.0)
Hemoglobin: 12 g/dL (ref 12.0–15.0)
Immature Granulocytes: 2 %
Lymphocytes Relative: 20 %
Lymphs Abs: 1.3 10*3/uL (ref 0.7–4.0)
MCH: 26.5 pg (ref 26.0–34.0)
MCHC: 32.8 g/dL (ref 30.0–36.0)
MCV: 81 fL (ref 80.0–100.0)
Monocytes Absolute: 0.6 10*3/uL (ref 0.1–1.0)
Monocytes Relative: 9 %
Neutro Abs: 4.3 10*3/uL (ref 1.7–7.7)
Neutrophils Relative %: 67 %
Platelet Count: 193 10*3/uL (ref 150–400)
RBC: 4.52 MIL/uL (ref 3.87–5.11)
RDW: 16.5 % — ABNORMAL HIGH (ref 11.5–15.5)
WBC Count: 6.4 10*3/uL (ref 4.0–10.5)
nRBC: 0 % (ref 0.0–0.2)

## 2021-10-06 MED ORDER — DOXORUBICIN HCL CHEMO IV INJECTION 2 MG/ML
60.0000 mg/m2 | Freq: Once | INTRAVENOUS | Status: AC
  Administered 2021-10-06: 108 mg via INTRAVENOUS
  Filled 2021-10-06: qty 54

## 2021-10-06 MED ORDER — SODIUM CHLORIDE 0.9 % IV SOLN
600.0000 mg/m2 | Freq: Once | INTRAVENOUS | Status: AC
  Administered 2021-10-06: 1080 mg via INTRAVENOUS
  Filled 2021-10-06: qty 54

## 2021-10-06 MED ORDER — SODIUM CHLORIDE 0.9% FLUSH
10.0000 mL | Freq: Once | INTRAVENOUS | Status: AC
  Administered 2021-10-06: 10 mL

## 2021-10-06 MED ORDER — SODIUM CHLORIDE 0.9% FLUSH
10.0000 mL | INTRAVENOUS | Status: DC | PRN
  Administered 2021-10-06: 10 mL

## 2021-10-06 MED ORDER — SODIUM CHLORIDE 0.9 % IV SOLN
150.0000 mg | Freq: Once | INTRAVENOUS | Status: AC
  Administered 2021-10-06: 150 mg via INTRAVENOUS
  Filled 2021-10-06: qty 150

## 2021-10-06 MED ORDER — DEXAMETHASONE SODIUM PHOSPHATE 10 MG/ML IJ SOLN
4.0000 mg | Freq: Once | INTRAMUSCULAR | Status: AC
  Administered 2021-10-06: 4 mg via INTRAVENOUS
  Filled 2021-10-06: qty 1

## 2021-10-06 MED ORDER — SODIUM CHLORIDE 0.9 % IV SOLN: Freq: Once | INTRAVENOUS | Status: AC

## 2021-10-06 MED ORDER — PALONOSETRON HCL INJECTION 0.25 MG/5ML
0.2500 mg | Freq: Once | INTRAVENOUS | Status: AC
  Administered 2021-10-06: 0.25 mg via INTRAVENOUS
  Filled 2021-10-06: qty 5

## 2021-10-06 MED ORDER — HEPARIN SOD (PORK) LOCK FLUSH 100 UNIT/ML IV SOLN
500.0000 [IU] | Freq: Once | INTRAVENOUS | Status: AC | PRN
  Administered 2021-10-06: 500 [IU]

## 2021-10-06 NOTE — Assessment & Plan Note (Signed)
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, margins negative, 4/7 lymph nodes, ER 75 to 95%, PR 9095%, HER2 negative, Ki-67 25% Genetics: Negative MammaPrint: Low risk (this is not a valid test for 4+ lymph nodes)  Treatment plan: 1.Adjuvant chemotherapy with dose dense Adriamycin and Cytoxan x4 followed by Taxol weekly x12started 08/25/2021 2.adjuvant radiation therapy 3.Followed by adjuvant antiestrogen therapy with complete estrogen blockade and abemaciclib ------------------------------------------------------------------------------------------------------------------------ Current treatment: Cycle4dose dense Adriamycin and Cytoxan 08/18/2021: Echocardiogram: EF 55 to 60%  Chemo toxicities: 1.Severe fatigue 2.severe upper respiratory infection/bronchitis/sinus congestion: Resolved 3.Severe bone pain due to Neulasta: She did much better with taking Motrin. 4.  Elevation  Skin itching: Eczema on both her thighs, I encouraged her to take triamcinolone  Diabetes: Appears to be under reasonable control.We are giving her Decadron 4 mg with each treatment. We would like to repeat the prognostic panel on the final pathology as well as a lymph nodes.  Return to clinic in2 weeksfor cycle 1 Taxol

## 2021-10-06 NOTE — Patient Instructions (Signed)
[facility] ONCOLOGY  Discharge Instructions: Thank you for choosing [facility] to provide your oncology and hematology care.   If you have a lab appointment with the [facility], please go directly to the [facility] and check in at the registration area.   Wear comfortable clothing and clothing appropriate for easy access to any Portacath or PICC line.   We strive to give you quality time with your provider. You may need to reschedule your appointment if you arrive late (15 or more minutes).  Arriving late affects you and other patients whose appointments are after yours.  Also, if you miss three or more appointments without notifying the office, you may be dismissed from the clinic at the provider's discretion.      For prescription refill requests, have your pharmacy contact our office and allow 72 hours for refills to be completed.    Today you received the following chemotherapy and/or immunotherapy agents Doxorubicin and cytoxan      To help prevent nausea and vomiting after your treatment, we encourage you to take your nausea medication as directed.  BELOW ARE SYMPTOMS THAT SHOULD BE REPORTED IMMEDIATELY: *FEVER GREATER THAN 100.4 F (38 C) OR HIGHER *CHILLS OR SWEATING *NAUSEA AND VOMITING THAT IS NOT CONTROLLED WITH YOUR NAUSEA MEDICATION *UNUSUAL SHORTNESS OF BREATH *UNUSUAL BRUISING OR BLEEDING *URINARY PROBLEMS (pain or burning when urinating, or frequent urination) *BOWEL PROBLEMS (unusual diarrhea, constipation, pain near the anus) TENDERNESS IN MOUTH AND THROAT WITH OR WITHOUT PRESENCE OF ULCERS (sore throat, sores in mouth, or a toothache) UNUSUAL RASH, SWELLING OR PAIN  UNUSUAL VAGINAL DISCHARGE OR ITCHING   Items with * indicate a potential emergency and should be followed up as soon as possible or go to the Emergency Department if any problems should occur.  Please show the CHEMOTHERAPY ALERT CARD or IMMUNOTHERAPY ALERT CARD at  check-in to the Emergency Department and triage nurse.  Should you have questions after your visit or need to cancel or reschedule your appointment, please contact [facility]  Dept: [phone]  and follow the prompts.  Office hours are 8:00 a.m. to 4:30 p.m. Monday - Friday. Please note that voicemails left after 4:00 p.m. may not be returned until the following business day.  We are closed weekends and major holidays. You have access to a nurse at all times for urgent questions. Please call the main number to the clinic Dept: [phone] and follow the prompts.   For any non-urgent questions, you may also contact your provider using MyChart. We now offer e-Visits for anyone 23 and older to request care online for non-urgent symptoms. For details visit [email or web address].   Also download the MyChart app! Go to the app store, search "MyChart", open the app, select [facility], and log in with your MyChart username and password.  Due to Covid, a mask is required upon entering the hospital/clinic. If you do not have a mask, one will be given to you upon arrival. For doctor visits, patients may have 1 support person aged 51 or older with them. For treatment visits, patients cannot have anyone with them due to current Covid guidelines and our immunocompromised population.

## 2021-10-07 ENCOUNTER — Other Ambulatory Visit: Payer: Self-pay

## 2021-10-07 ENCOUNTER — Inpatient Hospital Stay: Payer: 59

## 2021-10-07 VITALS — BP 106/77 | HR 91 | Temp 97.9°F | Resp 16

## 2021-10-07 DIAGNOSIS — C50811 Malignant neoplasm of overlapping sites of right female breast: Secondary | ICD-10-CM | POA: Diagnosis not present

## 2021-10-07 DIAGNOSIS — Z170 Estrogen receptor positive status [ER+]: Secondary | ICD-10-CM

## 2021-10-07 MED ORDER — PEGFILGRASTIM-BMEZ 6 MG/0.6ML ~~LOC~~ SOSY
6.0000 mg | PREFILLED_SYRINGE | Freq: Once | SUBCUTANEOUS | Status: AC
Start: 2021-10-07 — End: 2021-10-07
  Administered 2021-10-07: 6 mg via SUBCUTANEOUS

## 2021-10-09 ENCOUNTER — Telehealth: Payer: Self-pay | Admitting: Hematology and Oncology

## 2021-10-09 ENCOUNTER — Other Ambulatory Visit: Payer: Self-pay | Admitting: Hematology and Oncology

## 2021-10-09 MED ORDER — LEVOFLOXACIN 500 MG PO TABS: 500.0000 mg | ORAL_TABLET | Freq: Every day | ORAL | 0 refills | Status: DC

## 2021-10-09 MED ORDER — OSELTAMIVIR PHOSPHATE 75 MG PO CAPS: 75.0000 mg | ORAL_CAPSULE | Freq: Two times a day (BID) | ORAL | 0 refills | Status: DC

## 2021-10-09 NOTE — Telephone Encounter (Signed)
Scheduled appointment per 10/28 los. Left message.

## 2021-10-09 NOTE — Telephone Encounter (Signed)
Complaining of fevers with cough and expectoration with yellow/greenish sputum Upper respiratory infection probably from her children. Since the flu season is very active, I recommended treating with Tamiflu along with an antibiotic like levofloxacin. I sent a prescription to her pharmacy.

## 2021-10-11 ENCOUNTER — Encounter: Payer: Self-pay | Admitting: *Deleted

## 2021-10-17 ENCOUNTER — Ambulatory Visit: Payer: 59 | Attending: Plastic Surgery

## 2021-10-17 ENCOUNTER — Other Ambulatory Visit: Payer: Self-pay

## 2021-10-17 DIAGNOSIS — C50811 Malignant neoplasm of overlapping sites of right female breast: Secondary | ICD-10-CM | POA: Diagnosis present

## 2021-10-17 DIAGNOSIS — M25611 Stiffness of right shoulder, not elsewhere classified: Secondary | ICD-10-CM | POA: Insufficient documentation

## 2021-10-17 DIAGNOSIS — R293 Abnormal posture: Secondary | ICD-10-CM | POA: Diagnosis present

## 2021-10-17 DIAGNOSIS — Z170 Estrogen receptor positive status [ER+]: Secondary | ICD-10-CM | POA: Insufficient documentation

## 2021-10-17 NOTE — Therapy (Signed)
[facility] @ [place] [place], [place], [address] Phone: [phone]   Fax:  [phone]  Physical Therapy Treatment  Patient Details  Name: Allison Reed MRN: [id number] Date of Birth: 1976-03-28 Referring Provider (PT): Dr. Donne Hazel   Encounter Date: 10/17/2021   PT End of Session - 10/17/21 0958     Visit Number 5    Number of Visits 10    Date for PT Re-Evaluation 11/14/21    PT Start Time 0904    PT Stop Time 0957    PT Time Calculation (min) 53 min    Activity Tolerance Patient tolerated treatment well    Behavior During Therapy [facility] for tasks assessed/performed             Past Medical History:  Diagnosis Date   Diabetes mellitus without complication ([employer])    Type 2   Family history of brain tumor    Family history of skin cancer    Gestational diabetes    Headache    otc med prn   Heartburn in pregnancy    Incompetent cervix in pregnancy 04/2015   Missed abortion    no surgery required   Postpartum care following cesarean delivery (11/2) 10/12/2015   right breast ca 06/2021   UTI in pregnancy 03/2015   recent UTI, treatment completed    Past Surgical History:  Procedure Laterality Date   APPENDECTOMY  2006   BREAST RECONSTRUCTION WITH PLACEMENT OF TISSUE EXPANDER AND ALLODERM Right 07/25/2021   Procedure: RIGHT BREAST RECONSTRUCTION WITH PLACEMENT OF TISSUE EXPANDER AND ALLODERM;  Surgeon: Irene Limbo, MD;  Location: [facility];  Service: Plastics;  Laterality: Right;   CERCLAGE LAPAROSCOPIC ABDOMINAL N/A 04/15/2015   Procedure: LAPAROSCOPIC TRANSABDOMINAL CERVCOISTHMIC CERCLAGE;  Surgeon: Governor Specking, MD;  Location: [facility];  Service: Gynecology;  Laterality: N/A;   CERVICAL CERCLAGE  02/2011   vaginal   CESAREAN SECTION N/A 10/12/2015   Procedure: CESAREAN SECTION;  Surgeon: Princess Bruins, MD;  Location: [facility];  Service: Obstetrics;  Laterality: N/A;   CESAREAN  SECTION N/A 03/03/2018   Procedure: Repeat CESAREAN SECTION/Removal of Abdominal Cerclage;  Surgeon: Azucena Fallen, MD;  Location: [facility];  Service: Obstetrics;  Laterality: N/A;  EDD: 03/28/18   MASTECTOMY W/ SENTINEL NODE BIOPSY Right 07/25/2021   Procedure: RIGHT MASTECTOMY WITH AXILLARY SENTINEL LYMPH NODE BIOPSY;  Surgeon: [name], MD;  Location: [facility];  Service: General;  Laterality: Right;   PORTACATH PLACEMENT N/A 08/24/2021   Procedure: INSERTION PORT-A-CATH;  Surgeon: [name], MD;  Location: [facility];  Service: General;  Laterality: N/A;   RADIOACTIVE SEED GUIDED AXILLARY SENTINEL LYMPH NODE Right 07/25/2021   Procedure: RADIOACTIVE SEED GUIDED RIGHT AXILLARY NODE EXCISION;  Surgeon: [name], MD;  Location: [facility];  Service: General;  Laterality: Right;   [place] EXTRACTION  2001    There were no vitals filed for this visit.   Subjective Assessment - 10/17/21 0906     Subjective My last chemo was Oct 28 and it did not go well. I was extremely fatigued, had bad diarrhea and I was bleeding really bad for 2 days (had to change overnight pads every 2 hours). After last session of physical therapy I was sore but reasonably so. My pulleys order got messed up and I haven't been able to reorder yet sa I was feeling so bad.    Pertinent History She is a  wife of a pulmonary critical care physician . She noted a palpable right breast mass. This is followed by a mammogram with a mass with calcifications as really throughout the upper outer quadrant and in the central breast. This measures 9.4 x 8.7 x 6.7 cm. On ultrasound she had 3 separate masses at 10, 11, and 12:00. The sizes are 1.7, 2.1, and 2.2 cm. There is also a single abnormal axillary lymph node that has undergone biopsy. She also has undergone an MRI that shows extensive right breast disease with both mass and non-mass enhancement involving  the majority of the superior and upper outer right breast with relative sparing of the lower inner quadrant.Biopsy of the right breast lesions or a grade 2 invasive ductal carcinoma that is very ER, PR positive, HER2 negative, and Ki-67 is 25%. She is s/p right breast mastectomy with SLNB 0/7 LN on August 16 andl also had tissue expanders in place.  She is currently undergoing chemotherapy for 6 months every other week and radiation will follow    Patient Stated Goals Reassess after surgery    Currently in Pain? No/denies                               [facility] Adult PT Treatment/Exercise - 10/17/21 0001       Shoulder Exercises: Standing   Other Standing Exercises Modified downward dog on wall 2x, 5 sec holds and with dropping Rt shoulder to increase Rt lateral trunk stretch      Shoulder Exercises: Pulleys   ABduction 3 minutes      Manual Therapy   Manual Therapy Soft tissue mobilization;Myofascial release;Passive ROM    Soft tissue mobilization Right upper arm to axilla overa area of cording with cocoa butter (after MFR done) and then in Lt S/L to Rt scapular area/UT/levator with cocoa butter    Myofascial Release MFR to right axilla and medial arm cording    Passive ROM PROM right shoulder flexion and abduction for MFR, pt has full P/ROM                          PT Long Term Goals - 09/19/21 1010       PT LONG TERM GOAL #1   Title Pt will achieve full right shoulder ROM post surgery for full return to daily activities    Time 8    Period Weeks    Status New    Target Date 11/14/21      PT LONG TERM GOAL #2   Title Pt will improve right shoulder flexion and scaption by atleast 15 degrees    Baseline flexion 140,  abd 150    Time 4    Period Weeks    Status New    Target Date 10/17/21      PT LONG TERM GOAL #3   Title Pt will report decrease achiness and pulling from cording by 50% or more    Time 8    Period Weeks    Status New     Target Date 11/14/21      PT LONG TERM GOAL #4   Title Pt will be able to perform arm movements to continue with her [place] dancing    Time 8    Period Weeks    Status New    Target Date 11/14/21  Plan - 10/17/21 0959     Clinical Impression Statement Continued with focus on STM to Rt upper arm and axilla where cording is palpable. Also included Rt scapular mobs in Lt S/L and STM to Rt upper trap where trigger points palpable. Some release noted of these. Overall pt reports her Rt upper arm feeling much looser by end of session and some softening of cording along medial upper arm was noted as well.    Personal Factors and Comorbidities Comorbidity 1    Comorbidities right breast cancer s/p mastectomy  presently having chemo and pending and radiation    Stability/Clinical Decision Making Stable/Uncomplicated    Rehab Potential Excellent    PT Frequency 2x / week    PT Duration 8 weeks    PT Treatment/Interventions ADLs/Self Care Home Management;Therapeutic exercise;Manual techniques;Patient/family education;Manual lymph drainage;Scar mobilization;Passive range of motion    PT Next Visit Plan MFR to right axilla/medial arm cording,STM  AAROM,PROM, AROM, progress to strength as tolerated, discuss sleeve    PT Home Exercise Plan 4 post op exs , supine wand flexion and scaption; modified downward dog on wall    Consulted and Agree with Plan of Care Patient             Patient will benefit from skilled therapeutic intervention in order to improve the following deficits and impairments:  Postural dysfunction, Decreased knowledge of precautions, Decreased scar mobility, Increased fascial restricitons, Decreased range of motion  Visit Diagnosis: Malignant neoplasm of overlapping sites of right breast in female, estrogen receptor positive ([facility])  Abnormal posture  Stiffness of right shoulder, not elsewhere classified     Problem List Patient Active Problem  List   Diagnosis Date Noted   Port-A-Cath in place 08/25/2021   S/P mastectomy, right 07/25/2021   Genetic testing 07/01/2021   Cancer ([place]) 06/26/2021   Family history of skin cancer 06/22/2021   Family history of brain tumor 06/22/2021   Malignant neoplasm of overlapping sites of right breast in female, estrogen receptor positive ([place]) 06/21/2021   Premature uterine contractions causing threatened premature labor in third trimester 03/03/2018   Previous cesarean delivery, delivered 03/03/2018   Postpartum care following cesarean delivery (3/25) 03/03/2018   Status post repeat low transverse cesarean section 03/03/2018   DM (diabetes mellitus) in pregnancy 03/02/2018   Incompetent cervix in pregnancy 03/02/2018   Premature uterine contractions in third trimester, antepartum 03/02/2018   Active labor 10/12/2015    Otelia Limes, PTA 10/17/2021, 10:03 AM  [facility] @ [place], [place], [address] Phone: [phone]   Fax:  [phone]  Name: ELBERT POLYAKOV MRN: [id number] Date of Birth: [birth year]

## 2021-10-18 NOTE — Progress Notes (Signed)
Patient Care Team: Adrian Prince, MD as PCP - General (Endocrinology) Pershing Proud, RN as Oncology Nurse Navigator Donnelly Angelica, RN as Oncology Nurse Navigator  DIAGNOSIS:    ICD-10-CM   1. Malignant neoplasm of overlapping sites of right breast in female, estrogen receptor positive (HCC)  C50.811    Z17.0       SUMMARY OF ONCOLOGIC HISTORY: Oncology History Overview Note  Ductal carcinoma in situ of the right breast  She palpated a lump in her right breast. Diagnostic mammogram and [place] on 06/08/21 showed irregular hypoechoic lesions in the right breast with a single abnormal right axillary lymph node without normal cortex. Biopsy on 06/16/21 showed high grade DCIS with calcifications and necrosis.   Malignant neoplasm of overlapping sites of right breast in female, estrogen receptor positive (HCC)  06/16/2021 Initial Diagnosis   Palpable right breast mass: Breast MRI revealed extensive involvement of the right breast with mass and non-mass enhancement superior right breast mass measures 4.9 x 1.2 cm and together with non-mass enhancement measured 8.8 cm, enhancing mass LOQ 1.3 cm, left breast indeterminate 0.9 cm mass and a 0.8 cm mass UOQ, single abnormal lymph node Biopsy 10:00, 11:00 and 12:00: IDC with DCIS, biopsy right axillary lymph node: IDC, ER 75 to 95%, PR 90 to 95%, Ki-67 25%, HER2 negative on 1 biopsy and 2+ by IHC and FISH pending   06/21/2021 Cancer Staging   Staging form: Breast, AJCC 8th Edition - Clinical stage from 06/21/2021: Stage IIA (cT3, cN1, cM0, G2, ER+, PR+, HER2-) - Signed by [name], MD on 06/21/2021 Histologic grading system: 3 grade system    07/01/2021 Genetic Testing   Negative genetic testing:  No pathogenic variants detected on the [facility] BRCAplus panel (report date 07/01/2021) or the [employer] CancerNext-Expanded + RNAinsight panel (report date 07/01/2021).   The BRCAplus panel offered by [employer] and includes sequencing and  deletion/duplication analysis for the following 8 genes: ATM, BRCA1, BRCA2, CDH1, CHEK2, PALB2, PTEN, and TP53. The CancerNext-Expanded + RNAinsight gene panel offered by [employer] and includes sequencing and rearrangement analysis for the following 77 genes: AIP, ALK, APC, ATM, AXIN2, BAP1, BARD1, BLM, BMPR1A, BRCA1, BRCA2, BRIP1, CDC73, CDH1, CDK4, CDKN1B, CDKN2A, CHEK2, CTNNA1, DICER1, FANCC, FH, FLCN, GALNT12, KIF1B, LZTR1, MAX, MEN1, MET, MLH1, MSH2, MSH3, MSH6, MUTYH, NBN, NF1, NF2, NTHL1, PALB2, PHOX2B, PMS2, POT1, PRKAR1A, PTCH1, PTEN, RAD51C, RAD51D, RB1, RECQL, RET, SDHA, SDHAF2, SDHB, SDHC, SDHD, SMAD4, SMARCA4, SMARCB1, SMARCE1, STK11, SUFU, TMEM127, TP53, TSC1, TSC2, VHL and XRCC2 (sequencing and deletion/duplication); EGFR, EGLN1, HOXB13, KIT, MITF, PDGFRA, POLD1 and POLE (sequencing only); EPCAM and GREM1 (deletion/duplication only). RNA data is routinely analyzed for use in variant interpretation for all genes.   07/25/2021 Surgery   Right mastectomy: Foci of invasive ductal carcinoma measuring 1.1 cm grade 2 with extensive DCIS spanning 8.9 cm, margins negative, 4/7 lymph nodes, ER 75 to 95%, PR 9095%, HER2 negative, Ki-67 25%   08/25/2021 -  Chemotherapy   Patient is on Treatment Plan : BREAST ADJUVANT DOSE DENSE AC q14d / PACLitaxel q7d       CHIEF COMPLIANT: Cycle 1 Taxol  INTERVAL HISTORY: Allison Reed is a 45 y.o. with above-mentioned history of the right breast having undergone right mastectomy, currently on chemotherapy with Taxol. She presents to the clinic today for treatment.  She had a really rough time with the last cycle of chemo the first several days she had upper respiratory infection with slight low-grade temperatures and body  aches and pains.  She was given Levaquin which seem to have helped.  We also prescribed Tamiflu but she did not take it.  She also had profound menstrual bleeding that lasted for 4 to 5 days with heavy cycle and requiring multiple pads.   Finally subsided and she started to feel better.  This past week she has been doing much better with ability to walk and do other household chores.  She is glad that Adriamycin and Cytoxan is complete.  ALLERGIES:  is allergic to betadine [povidone iodine] and tape.  MEDICATIONS:  Current Outpatient Medications  Medication Sig Dispense Refill   Empagliflozin-metFORMIN HCl ER (SYNJARDY XR) 09-999 MG TB24 Take by mouth.     lidocaine-prilocaine (EMLA) cream Apply to affected area once 30 g 3   LINAGLIPTIN PO Take by mouth.     ondansetron (ZOFRAN) 8 MG tablet Take 1 tablet (8 mg total) by mouth 2 (two) times daily as needed. Start on the third day after chemotherapy. 30 tablet 1   prochlorperazine (COMPAZINE) 10 MG tablet Take 1 tablet (10 mg total) by mouth every 6 (six) hours as needed (Nausea or vomiting). 30 tablet 1   sitaGLIPtin (JANUVIA) 100 MG tablet Take 100 mg by mouth daily.     No current facility-administered medications for this visit.    PHYSICAL EXAMINATION: ECOG PERFORMANCE STATUS: 1 - Symptomatic but completely ambulatory  Vitals:   10/20/21 1207  BP: 101/64  Pulse: 91  Resp: 16  Temp: 97.7 F (36.5 C)  SpO2: 100%   Filed Weights   10/20/21 1207  Weight: 150 lb 4.8 oz (68.2 kg)       LABORATORY DATA:  I have reviewed the data as listed CMP Latest Ref Rng & Units 10/06/2021 09/21/2021 09/08/2021  Glucose 70 - 99 mg/dL 181(H) 135(H) 164(H)  BUN 6 - 20 mg/dL $Remove'9 10 8  'ewYeAvQ$ Creatinine 0.44 - 1.00 mg/dL 0.64 0.34(L) 0.64  Sodium 135 - 145 mmol/L 136 135 141  Potassium 3.5 - 5.1 mmol/L 4.2 4.1 3.7  Chloride 98 - 111 mmol/L 103 100 106  CO2 22 - 32 mmol/L 22 24 21(L)  Calcium 8.9 - 10.3 mg/dL 9.0 9.5 9.2  Total Protein 6.5 - 8.1 g/dL 6.7 6.8 6.5  Total Bilirubin 0.3 - 1.2 mg/dL 0.4 0.6 0.3  Alkaline Phos 38 - 126 U/L 85 78 81  AST 15 - 41 U/L $Remo'19 27 20  'KgPjc$ ALT 0 - 44 U/L $Remo'26 31 28    'KIKmP$ Lab Results  Component Value Date   WBC 5.8 10/20/2021   HGB 9.8 (L) 10/20/2021    HCT 30.4 (L) 10/20/2021   MCV 82.8 10/20/2021   PLT 247 10/20/2021   NEUTROABS 4.0 10/20/2021    ASSESSMENT & PLAN:  Malignant neoplasm of overlapping sites of right breast in female, estrogen receptor positive ([place]) 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  Adjuvant chemotherapy with dose dense Adriamycin and Cytoxan x4 followed by Taxol weekly x12 started 08/25/2021 2. adjuvant radiation therapy 3.  Followed by adjuvant antiestrogen therapy with complete estrogen blockade and abemaciclib ------------------------------------------------------------------------------------------------------------------------ Current treatment: Completed 4 cycles of dose dense Adriamycin and Cytoxan, today cycle 1 Taxol 08/18/2021: Echocardiogram: EF 55 to 60%   Chemo toxicities:  1.  Severe fatigue 2. severe upper respiratory infection/bronchitis/sinus congestion: Treated with Levaquin again. 3.  Heavy menstrual bleed: Because significantly heavier bleeding this time.  Her hemoglobin dipped to 9.8 because of that.   Skin itching: Eczema on both her thighs: Resolved   Diabetes: Appears to be under reasonable control.. Repeat prognostic panel on the lymph node in the final pathology also came back as ER/PR positive   Return to clinic in 1 week for cycle 2 of Taxol and follow-up with me.    No orders of the defined types were placed in this encounter.  The patient has a good understanding of the overall plan. she agrees with it. she will call with any problems that may develop before the next visit here.  Total time spent: 30 mins including face to face time and time spent for planning, charting and coordination of care  Rulon Eisenmenger, MD, MPH 10/20/2021  I, Thana Ates, am acting as scribe for Dr. [name].  I have reviewed the above documentation for accuracy and completeness, and I agree with the above.

## 2021-10-19 ENCOUNTER — Other Ambulatory Visit: Payer: Self-pay

## 2021-10-19 ENCOUNTER — Ambulatory Visit: Payer: 59

## 2021-10-19 DIAGNOSIS — Z170 Estrogen receptor positive status [ER+]: Secondary | ICD-10-CM

## 2021-10-19 DIAGNOSIS — C50811 Malignant neoplasm of overlapping sites of right female breast: Secondary | ICD-10-CM

## 2021-10-19 DIAGNOSIS — M25611 Stiffness of right shoulder, not elsewhere classified: Secondary | ICD-10-CM

## 2021-10-19 DIAGNOSIS — R293 Abnormal posture: Secondary | ICD-10-CM

## 2021-10-19 NOTE — Therapy (Signed)
[facility] @ [place], [place], [address] Phone: [phone]   Fax:  [phone]  Physical Therapy Treatment  Patient Details  Name: Allison Reed MRN: [id number] Date of Birth: 1976-04-15 Referring Provider (PT): Dr. Donne Hazel   Encounter Date: 10/19/2021   PT End of Session - 10/19/21 1101     Visit Number 6    Number of Visits 10    Date for PT Re-Evaluation 11/14/21    PT Start Time 1005    PT Stop Time 1059    PT Time Calculation (min) 54 min    Activity Tolerance Patient tolerated treatment well    Behavior During Therapy [facility] for tasks assessed/performed             Past Medical History:  Diagnosis Date   Diabetes mellitus without complication ([place])    Type 2   Family history of brain tumor    Family history of skin cancer    Gestational diabetes    Headache    otc med prn   Heartburn in pregnancy    Incompetent cervix in pregnancy 04/2015   Missed abortion    no surgery required   Postpartum care following cesarean delivery (11/2) 10/12/2015   right breast ca 06/2021   UTI in pregnancy 03/2015   recent UTI, treatment completed    Past Surgical History:  Procedure Laterality Date   APPENDECTOMY  2006   BREAST RECONSTRUCTION WITH PLACEMENT OF TISSUE EXPANDER AND ALLODERM Right 07/25/2021   Procedure: RIGHT BREAST RECONSTRUCTION WITH PLACEMENT OF TISSUE EXPANDER AND ALLODERM;  Surgeon: Irene Limbo, MD;  Location: [facility];  Service: Plastics;  Laterality: Right;   CERCLAGE LAPAROSCOPIC ABDOMINAL N/A 04/15/2015   Procedure: LAPAROSCOPIC TRANSABDOMINAL CERVCOISTHMIC CERCLAGE;  Surgeon: Governor Specking, MD;  Location: [facility];  Service: Gynecology;  Laterality: N/A;   CERVICAL CERCLAGE  02/2011   vaginal   CESAREAN SECTION N/A 10/12/2015   Procedure: CESAREAN SECTION;  Surgeon: Princess Bruins, MD;  Location: [facility];  Service: Obstetrics;  Laterality: N/A;   CESAREAN  SECTION N/A 03/03/2018   Procedure: Repeat CESAREAN SECTION/Removal of Abdominal Cerclage;  Surgeon: Azucena Fallen, MD;  Location: [facility];  Service: Obstetrics;  Laterality: N/A;  EDD: 03/28/18   MASTECTOMY W/ SENTINEL NODE BIOPSY Right 07/25/2021   Procedure: RIGHT MASTECTOMY WITH AXILLARY SENTINEL LYMPH NODE BIOPSY;  Surgeon: [name], MD;  Location: [facility];  Service: General;  Laterality: Right;   PORTACATH PLACEMENT N/A 08/24/2021   Procedure: INSERTION PORT-A-CATH;  Surgeon: [name], MD;  Location: [facility];  Service: General;  Laterality: N/A;   RADIOACTIVE SEED GUIDED AXILLARY SENTINEL LYMPH NODE Right 07/25/2021   Procedure: RADIOACTIVE SEED GUIDED RIGHT AXILLARY NODE EXCISION;  Surgeon: [name], MD;  Location: [facility];  Service: General;  Laterality: Right;   [place] EXTRACTION  2001    There were no vitals filed for this visit.   Subjective Assessment - 10/19/21 1007     Subjective I felt really good after last session. I was tired but not really very sore and I felt so much looser. I tried plank yesterday and it felt good but I could only do it for about 20 sec.    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 She noted a palpable right breast mass. This is followed by a mammogram with a mass with calcifications as really throughout  the upper outer quadrant and in the central breast. This measures 9.4 x 8.7 x 6.7 cm. On ultrasound she had 3 separate masses at 10, 11, and 12:00. The sizes are 1.7, 2.1, and 2.2 cm. There is also a single abnormal axillary lymph node that has undergone biopsy. She also has undergone an MRI that shows extensive right breast disease with both mass and non-mass enhancement involving the majority of the superior and upper outer right breast with relative sparing of the lower inner quadrant.Biopsy of the right breast lesions or a grade 2  invasive ductal carcinoma that is very ER, PR positive, HER2 negative, and Ki-67 is 25%. She is s/p right breast mastectomy with SLNB 0/7 LN on August 16 andl also had tissue expanders in place.  She is currently undergoing chemotherapy for 6 months every other week and radiation will follow    Patient Stated Goals Reassess after surgery    Currently in Pain? No/denies                    L-DEX FLOWSHEETS - 10/19/21 1000       L-DEX LYMPHEDEMA SCREENING   Measurement Type Unilateral    L-DEX MEASUREMENT EXTREMITY Upper Extremity    POSITION  Standing    DOMINANT SIDE Right    At Risk Side Right    BASELINE SCORE (UNILATERAL) 0    L-DEX SCORE (UNILATERAL) 0.8    VALUE CHANGE (UNILAT) 0.8                       [facility] Adult PT Treatment/Exercise - 10/19/21 0001       Shoulder Exercises: Standing   Other Standing Exercises Plank on floor ~15 sec and then wall push ups 10x      Shoulder Exercises: Pulleys   ABduction 2 minutes    ABduction Limitations Pt with full ROM      Shoulder Exercises: Therapy Ball   Flexion Both;10 reps   forward lean into end of stretch   ABduction Right;10 reps   same side lean into end of stretch     Manual Therapy   Manual Therapy Soft tissue mobilization;Myofascial release;Passive ROM    Soft tissue mobilization Right upper arm to axilla overa area of cording with cocoa butter (after MFR done) and then in Lt S/L to Rt periscapular area/UT/levator with cocoa butter    Myofascial Release MFR to right axilla and medial arm cording    Passive ROM PROM right shoulder flexion and abduction for MFR, pt has full P/ROM                          PT Long Term Goals - 09/19/21 1010       PT LONG TERM GOAL #1   Title Pt will achieve full right shoulder ROM post surgery for full return to daily activities    Time 8    Period Weeks    Status New    Target Date 11/14/21      PT LONG TERM GOAL #2   Title Pt will improve  right shoulder flexion and scaption by atleast 15 degrees    Baseline flexion 140,  abd 150    Time 4    Period Weeks    Status New    Target Date 10/17/21      PT LONG TERM GOAL #3   Title Pt will report decrease achiness and pulling from cording  by 50% or more    Time 8    Period Weeks    Status New    Target Date 11/14/21      PT LONG TERM GOAL #4   Title Pt will be able to perform arm movements to continue with her indian dancing    Time 8    Period Weeks    Status New    Target Date 11/14/21                   Plan - 10/19/21 1101     Clinical Impression Statement Started to progress pt with strengthening doing wall push ups and pt showed therapist plank that she started trying yesterday. She reports no pain or discomfort with either exercise. then continued with manual therapy working on decreasing cording and muscular tightness in Rt upper medial arm and along lateral trunk. By end of session it was hard to detect cording as this was so mild and her tightness was much improved. Pt will benefit from continued physical therapy to continue to progres her into strengthening exercises, including Strength ABC Program.    Personal Factors and Comorbidities Comorbidity 1    Comorbidities right breast cancer s/p mastectomy  presently having chemo and pending and radiation    Stability/Clinical Decision Making Stable/Uncomplicated    Rehab Potential Excellent    PT Frequency 2x / week    PT Duration 8 weeks    PT Treatment/Interventions ADLs/Self Care Home Management;Therapeutic exercise;Manual techniques;Patient/family education;Manual lymph drainage;Scar mobilization;Passive range of motion    PT Next Visit Plan Progress pt with strengthening Strength ABC program; manual therapy prn    PT Home Exercise Plan 4 post op exs , supine wand flexion and scaption; modified downward dog on wall    Consulted and Agree with Plan of Care Patient             Patient will benefit  from skilled therapeutic intervention in order to improve the following deficits and impairments:  Postural dysfunction, Decreased knowledge of precautions, Decreased scar mobility, Increased fascial restricitons, Decreased range of motion  Visit Diagnosis: Malignant neoplasm of overlapping sites of right breast in female, estrogen receptor positive (HCC)  Abnormal posture  Stiffness of right shoulder, not elsewhere classified     Problem List Patient Active Problem List   Diagnosis Date Noted   Port-A-Cath in place 08/25/2021   S/P mastectomy, right 07/25/2021   Genetic testing 07/01/2021   Cancer ([place]) 06/26/2021   Family history of skin cancer 06/22/2021   Family history of brain tumor 06/22/2021   Malignant neoplasm of overlapping sites of right breast in female, estrogen receptor positive ([place]) 06/21/2021   Premature uterine contractions causing threatened premature labor in third trimester 03/03/2018   Previous cesarean delivery, delivered 03/03/2018   Postpartum care following cesarean delivery (3/25) 03/03/2018   Status post repeat low transverse cesarean section 03/03/2018   DM (diabetes mellitus) in pregnancy 03/02/2018   Incompetent cervix in pregnancy 03/02/2018   Premature uterine contractions in third trimester, antepartum 03/02/2018   Active labor 10/12/2015    Otelia Limes, PTA 10/19/2021, 11:06 AM  [facility] @ [place], [place], [address] Phone: [phone]   Fax:  [phone]  Name: ALIYAH ABEYTA MRN: [id number] Date of Birth: 27-Aug-1976

## 2021-10-20 ENCOUNTER — Inpatient Hospital Stay: Payer: 59

## 2021-10-20 ENCOUNTER — Inpatient Hospital Stay: Payer: 59 | Attending: Hematology and Oncology

## 2021-10-20 ENCOUNTER — Inpatient Hospital Stay (HOSPITAL_BASED_OUTPATIENT_CLINIC_OR_DEPARTMENT_OTHER): Payer: 59 | Admitting: Hematology and Oncology

## 2021-10-20 VITALS — BP 110/85 | HR 90 | Temp 98.8°F | Resp 16

## 2021-10-20 DIAGNOSIS — Z23 Encounter for immunization: Secondary | ICD-10-CM | POA: Insufficient documentation

## 2021-10-20 DIAGNOSIS — Z5111 Encounter for antineoplastic chemotherapy: Secondary | ICD-10-CM | POA: Diagnosis present

## 2021-10-20 DIAGNOSIS — C50811 Malignant neoplasm of overlapping sites of right female breast: Secondary | ICD-10-CM | POA: Diagnosis not present

## 2021-10-20 DIAGNOSIS — Z170 Estrogen receptor positive status [ER+]: Secondary | ICD-10-CM

## 2021-10-20 DIAGNOSIS — Z9011 Acquired absence of right breast and nipple: Secondary | ICD-10-CM | POA: Diagnosis not present

## 2021-10-20 DIAGNOSIS — Z7985 Long-term (current) use of injectable non-insulin antidiabetic drugs: Secondary | ICD-10-CM | POA: Diagnosis not present

## 2021-10-20 DIAGNOSIS — E119 Type 2 diabetes mellitus without complications: Secondary | ICD-10-CM | POA: Diagnosis not present

## 2021-10-20 DIAGNOSIS — Z79899 Other long term (current) drug therapy: Secondary | ICD-10-CM | POA: Insufficient documentation

## 2021-10-20 DIAGNOSIS — Z95828 Presence of other vascular implants and grafts: Secondary | ICD-10-CM

## 2021-10-20 LAB — CMP (CANCER CENTER ONLY)
ALT: 19 U/L (ref 0–44)
AST: 16 U/L (ref 15–41)
Albumin: 4 g/dL (ref 3.5–5.0)
Alkaline Phosphatase: 73 U/L (ref 38–126)
Anion gap: 11 (ref 5–15)
BUN: 7 mg/dL (ref 6–20)
CO2: 22 mmol/L (ref 22–32)
Calcium: 9 mg/dL (ref 8.9–10.3)
Chloride: 105 mmol/L (ref 98–111)
Creatinine: 0.6 mg/dL (ref 0.44–1.00)
GFR, Estimated: >60 mL/min (ref >60)
Glucose, Bld: 175 mg/dL — ABNORMAL HIGH (ref 70–99)
Potassium: 4.2 mmol/L (ref 3.5–5.1)
Sodium: 138 mmol/L (ref 135–145)
Total Bilirubin: 0.3 mg/dL (ref 0.3–1.2)
Total Protein: 6.5 g/dL (ref 6.5–8.1)

## 2021-10-20 LAB — CBC WITH DIFFERENTIAL (CANCER CENTER ONLY)
Abs Immature Granulocytes: 0.04 10*3/uL (ref 0.00–0.07)
Basophils Absolute: 0 10*3/uL (ref 0.0–0.1)
Basophils Relative: 1 %
Eosinophils Absolute: 0.1 10*3/uL (ref 0.0–0.5)
Eosinophils Relative: 1 %
HCT: 30.4 % — ABNORMAL LOW (ref 36.0–46.0)
Hemoglobin: 9.8 g/dL — ABNORMAL LOW (ref 12.0–15.0)
Immature Granulocytes: 1 %
Lymphocytes Relative: 20 %
Lymphs Abs: 1.2 10*3/uL (ref 0.7–4.0)
MCH: 26.7 pg (ref 26.0–34.0)
MCHC: 32.2 g/dL (ref 30.0–36.0)
MCV: 82.8 fL (ref 80.0–100.0)
Monocytes Absolute: 0.5 10*3/uL (ref 0.1–1.0)
Monocytes Relative: 9 %
Neutro Abs: 4 10*3/uL (ref 1.7–7.7)
Neutrophils Relative %: 68 %
Platelet Count: 247 10*3/uL (ref 150–400)
RBC: 3.67 MIL/uL — ABNORMAL LOW (ref 3.87–5.11)
RDW: 18.3 % — ABNORMAL HIGH (ref 11.5–15.5)
WBC Count: 5.8 10*3/uL (ref 4.0–10.5)
nRBC: 0 % (ref 0.0–0.2)

## 2021-10-20 MED ORDER — SODIUM CHLORIDE 0.9 % IV SOLN
80.0000 mg/m2 | Freq: Once | INTRAVENOUS | Status: AC
  Administered 2021-10-20: 144 mg via INTRAVENOUS
  Filled 2021-10-20: qty 24

## 2021-10-20 MED ORDER — SODIUM CHLORIDE 0.9% FLUSH
10.0000 mL | Freq: Once | INTRAVENOUS | Status: AC
  Administered 2021-10-20: 10 mL

## 2021-10-20 MED ORDER — DEXAMETHASONE SODIUM PHOSPHATE 10 MG/ML IJ SOLN
4.0000 mg | Freq: Once | INTRAMUSCULAR | Status: AC
  Administered 2021-10-20: 4 mg via INTRAVENOUS
  Filled 2021-10-20: qty 1

## 2021-10-20 MED ORDER — HEPARIN SOD (PORK) LOCK FLUSH 100 UNIT/ML IV SOLN
500.0000 [IU] | Freq: Once | INTRAVENOUS | Status: AC | PRN
  Administered 2021-10-20: 500 [IU]

## 2021-10-20 MED ORDER — DIPHENHYDRAMINE HCL 25 MG PO CAPS
25.0000 mg | ORAL_CAPSULE | Freq: Once | ORAL | Status: AC
  Administered 2021-10-20: 25 mg via ORAL
  Filled 2021-10-20: qty 1

## 2021-10-20 MED ORDER — INFLUENZA VAC SPLIT QUAD 0.5 ML IM SUSY
0.5000 mL | PREFILLED_SYRINGE | Freq: Once | INTRAMUSCULAR | Status: AC
  Administered 2021-10-20: 0.5 mL via INTRAMUSCULAR
  Filled 2021-10-20: qty 0.5

## 2021-10-20 MED ORDER — SODIUM CHLORIDE 0.9% FLUSH
10.0000 mL | INTRAVENOUS | Status: DC | PRN
  Administered 2021-10-20: 10 mL

## 2021-10-20 MED ORDER — FAMOTIDINE 20 MG IN NS 100 ML IVPB
20.0000 mg | Freq: Once | INTRAVENOUS | Status: AC
  Administered 2021-10-20: 20 mg via INTRAVENOUS
  Filled 2021-10-20: qty 100

## 2021-10-20 MED ORDER — SODIUM CHLORIDE 0.9 % IV SOLN: Freq: Once | INTRAVENOUS | Status: AC

## 2021-10-20 NOTE — Patient Instructions (Addendum)
[facility]  Discharge Instructions: Thank you for choosing [facility] to provide your oncology and hematology care.   If you have a lab appointment with the [facility], please go directly to the [facility] and check in at the registration area.   Wear comfortable clothing and clothing appropriate for easy access to any Portacath or PICC line.   We strive to give you quality time with your provider. You may need to reschedule your appointment if you arrive late (15 or more minutes).  Arriving late affects you and other patients whose appointments are after yours.  Also, if you miss three or more appointments without notifying the office, you may be dismissed from the clinic at the provider's discretion.      For prescription refill requests, have your pharmacy contact our office and allow 72 hours for refills to be completed.    Today you received the following chemotherapy and/or immunotherapy agents: Taxol    To help prevent nausea and vomiting after your treatment, we encourage you to take your nausea medication as directed.  BELOW ARE SYMPTOMS THAT SHOULD BE REPORTED IMMEDIATELY: *FEVER GREATER THAN 100.4 F (38 C) OR HIGHER *CHILLS OR SWEATING *NAUSEA AND VOMITING THAT IS NOT CONTROLLED WITH YOUR NAUSEA MEDICATION *UNUSUAL SHORTNESS OF BREATH *UNUSUAL BRUISING OR BLEEDING *URINARY PROBLEMS (pain or burning when urinating, or frequent urination) *BOWEL PROBLEMS (unusual diarrhea, constipation, pain near the anus) TENDERNESS IN MOUTH AND THROAT WITH OR WITHOUT PRESENCE OF ULCERS (sore throat, sores in mouth, or a toothache) UNUSUAL RASH, SWELLING OR PAIN  UNUSUAL VAGINAL DISCHARGE OR ITCHING   Items with * indicate a potential emergency and should be followed up as soon as possible or go to the Emergency Department if any problems should occur.  Please show the CHEMOTHERAPY ALERT CARD or IMMUNOTHERAPY ALERT CARD at check-in to the  Emergency Department and triage nurse.  Should you have questions after your visit or need to cancel or reschedule your appointment, please contact [facility]  Dept: [phone]  and follow the prompts.  Office hours are 8:00 a.m. to 4:30 p.m. Monday - Friday. Please note that voicemails left after 4:00 p.m. may not be returned until the following business day.  We are closed weekends and major holidays. You have access to a nurse at all times for urgent questions. Please call the main number to the clinic Dept: [phone] and follow the prompts.   For any non-urgent questions, you may also contact your provider using MyChart. We now offer e-Visits for anyone 27 and older to request care online for non-urgent symptoms. For details visit [email or web address].   Also download the MyChart app! Go to the app store, search "MyChart", open the app, select [facility], and log in with your MyChart username and password.  Due to Covid, a mask is required upon entering the hospital/clinic. If you do not have a mask, one will be given to you upon arrival. For doctor visits, patients may have 1 support person aged 42 or older with them. For treatment visits, patients cannot have anyone with them due to current Covid guidelines and our immunocompromised population.   Paclitaxel injection What is this medication? PACLITAXEL (PAK li TAX el) is a chemotherapy drug. It targets fast dividing cells, like cancer cells, and causes these cells to die. This medicine is used to treat ovarian cancer, breast cancer, lung cancer, Kaposi's sarcoma, and other cancers. This medicine may be used  for other purposes; ask your health care provider or pharmacist if you have questions. COMMON BRAND NAME(S): Onxol, Taxol What should I tell my care team before I take this medication? They need to know if you have any of these conditions: history of irregular heartbeat liver disease low blood  counts, like low white cell, platelet, or red cell counts lung or breathing disease, like asthma tingling of the fingers or toes, or other nerve disorder an unusual or allergic reaction to paclitaxel, alcohol, polyoxyethylated castor oil, other chemotherapy, other medicines, foods, dyes, or preservatives pregnant or trying to get pregnant breast-feeding How should I use this medication? This drug is given as an infusion into a vein. It is administered in a hospital or clinic by a specially trained health care professional. Talk to your pediatrician regarding the use of this medicine in children. Special care may be needed. Overdosage: If you think you have taken too much of this medicine contact a poison control center or emergency room at once. NOTE: This medicine is only for you. Do not share this medicine with others. What if I miss a dose? It is important not to miss your dose. Call your doctor or health care professional if you are unable to keep an appointment. What may interact with this medication? Do not take this medicine with any of the following medications: live virus vaccines This medicine may also interact with the following medications: antiviral medicines for hepatitis, HIV or AIDS certain antibiotics like erythromycin and clarithromycin certain medicines for fungal infections like ketoconazole and itraconazole certain medicines for seizures like carbamazepine, phenobarbital, phenytoin gemfibrozil nefazodone rifampin St. John's wort This list may not describe all possible interactions. Give your health care provider a list of all the medicines, herbs, non-prescription drugs, or dietary supplements you use. Also tell them if you smoke, drink alcohol, or use illegal drugs. Some items may interact with your medicine. What should I watch for while using this medication? Your condition will be monitored carefully while you are receiving this medicine. You will need important  blood work done while you are taking this medicine. This medicine can cause serious allergic reactions. To reduce your risk you will need to take other medicine(s) before treatment with this medicine. If you experience allergic reactions like skin rash, itching or hives, swelling of the face, lips, or tongue, tell your doctor or health care professional right away. In some cases, you may be given additional medicines to help with side effects. Follow all directions for their use. This drug may make you feel generally unwell. This is not uncommon, as chemotherapy can affect healthy cells as well as cancer cells. Report any side effects. Continue your course of treatment even though you feel ill unless your doctor tells you to stop. Call your doctor or health care professional for advice if you get a fever, chills or sore throat, or other symptoms of a cold or flu. Do not treat yourself. This drug decreases your body's ability to fight infections. Try to avoid being around people who are sick. This medicine may increase your risk to bruise or bleed. Call your doctor or health care professional if you notice any unusual bleeding. Be careful brushing and flossing your teeth or using a toothpick because you may get an infection or bleed more easily. If you have any dental work done, tell your dentist you are receiving this medicine. Avoid taking products that contain aspirin, acetaminophen, ibuprofen, naproxen, or ketoprofen unless instructed by your doctor. These  medicines may hide a fever. Do not become pregnant while taking this medicine. Women should inform their doctor if they wish to become pregnant or think they might be pregnant. There is a potential for serious side effects to an unborn child. Talk to your health care professional or pharmacist for more information. Do not breast-feed an infant while taking this medicine. Men are advised not to father a child while receiving this medicine. This product  may contain alcohol. Ask your pharmacist or healthcare provider if this medicine contains alcohol. Be sure to tell all healthcare providers you are taking this medicine. Certain medicines, like metronidazole and disulfiram, can cause an unpleasant reaction when taken with alcohol. The reaction includes flushing, headache, nausea, vomiting, sweating, and increased thirst. The reaction can last from 30 minutes to several hours. What side effects may I notice from receiving this medication? Side effects that you should report to your doctor or health care professional as soon as possible: allergic reactions like skin rash, itching or hives, swelling of the face, lips, or tongue breathing problems changes in vision fast, irregular heartbeat high or low blood pressure mouth sores pain, tingling, numbness in the hands or feet signs of decreased platelets or bleeding - bruising, pinpoint red spots on the skin, black, tarry stools, blood in the urine signs of decreased red blood cells - unusually weak or tired, feeling faint or lightheaded, falls signs of infection - fever or chills, cough, sore throat, pain or difficulty passing urine signs and symptoms of liver injury like dark yellow or brown urine; general ill feeling or flu-like symptoms; light-colored stools; loss of appetite; nausea; right upper belly pain; unusually weak or tired; yellowing of the eyes or skin swelling of the ankles, feet, hands unusually slow heartbeat Side effects that usually do not require medical attention (report to your doctor or health care professional if they continue or are bothersome): diarrhea hair loss loss of appetite muscle or joint pain nausea, vomiting pain, redness, or irritation at site where injected tiredness This list may not describe all possible side effects. Call your doctor for medical advice about side effects. You may report side effects to FDA at 1-800-FDA-1088. Where should I keep my  medication? This drug is given in a hospital or clinic and will not be stored at home. NOTE: This sheet is a summary. It may not cover all possible information. If you have questions about this medicine, talk to your doctor, pharmacist, or health care provider.  2022 Elsevier/Gold Standard (2021-08-15 00:00:00)  Influenza Virus Vaccine injection What is this medication? INFLUENZA VIRUS VACCINE (in floo EN zuh VAHY ruhs vak SEEN) helps to reduce the risk of getting influenza also known as the flu. The vaccine only helps protect you against some strains of the flu. This medicine may be used for other purposes; ask your health care provider or pharmacist if you have questions. COMMON BRAND NAME(S): Afluria, Afluria Quadrivalent, Agriflu, Alfuria, FLUAD, FLUAD Quadrivalent, Fluarix, Fluarix Quadrivalent, Flublok, Flublok Quadrivalent, FLUCELVAX, FLUCELVAX Quadrivalent, Flulaval, Flulaval Quadrivalent, Fluvirin, Fluzone, Fluzone High-Dose, Fluzone Intradermal, Fluzone Quadrivalent What should I tell my care team before I take this medication? They need to know if you have any of these conditions: bleeding disorder like hemophilia fever or infection Guillain-Barre syndrome or other neurological problems immune system problems infection with the human immunodeficiency virus (HIV) or AIDS low blood platelet counts multiple sclerosis an unusual or allergic reaction to influenza virus vaccine, latex, other medicines, foods, dyes, or preservatives. Different brands of  vaccines contain different allergens. Some may contain latex or eggs. Talk to your doctor about your allergies to make sure that you get the right vaccine. pregnant or trying to get pregnant breast-feeding How should I use this medication? This vaccine is for injection into a muscle or under the skin. It is given by a health care professional. A copy of Vaccine Information Statements will be given before each vaccination. Read this sheet  carefully each time. The sheet may change frequently. Talk to your healthcare provider to see which vaccines are right for you. Some vaccines should not be used in all age groups. Overdosage: If you think you have taken too much of this medicine contact a poison control center or emergency room at once. NOTE: This medicine is only for you. Do not share this medicine with others. What if I miss a dose? This does not apply. What may interact with this medication? chemotherapy or radiation therapy medicines that lower your immune system like etanercept, anakinra, infliximab, and adalimumab medicines that treat or prevent blood clots like warfarin phenytoin steroid medicines like prednisone or cortisone theophylline vaccines This list may not describe all possible interactions. Give your health care provider a list of all the medicines, herbs, non-prescription drugs, or dietary supplements you use. Also tell them if you smoke, drink alcohol, or use illegal drugs. Some items may interact with your medicine. What should I watch for while using this medication? Report any side effects that do not go away within 3 days to your doctor or health care professional. Call your health care provider if any unusual symptoms occur within 6 weeks of receiving this vaccine. You may still catch the flu, but the illness is not usually as bad. You cannot get the flu from the vaccine. The vaccine will not protect against colds or other illnesses that may cause fever. The vaccine is needed every year. What side effects may I notice from receiving this medication? Side effects that you should report to your doctor or health care professional as soon as possible: allergic reactions like skin rash, itching or hives, swelling of the face, lips, or tongue Side effects that usually do not require medical attention (report to your doctor or health care professional if they continue or are bothersome): fever headache muscle  aches and pains pain, tenderness, redness, or swelling at the injection site tiredness This list may not describe all possible side effects. Call your doctor for medical advice about side effects. You may report side effects to FDA at 1-800-FDA-1088. Where should I keep my medication? The vaccine will be given by a health care professional in a clinic, pharmacy, doctor's office, or other health care setting. You will not be given vaccine doses to store at home. NOTE: This sheet is a summary. It may not cover all possible information. If you have questions about this medicine, talk to your doctor, pharmacist, or health care provider.  2022 Elsevier/Gold Standard (2021-08-15 00:00:00)

## 2021-10-20 NOTE — Assessment & Plan Note (Signed)
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, margins negative, 4/7 lymph nodes, ER 75 to 95%, PR 9095%, HER2 negative, Ki-67 25% Genetics: Negative MammaPrint: Low risk (this is not a valid test for 4+ lymph nodes)  Treatment plan: 1.Adjuvant chemotherapy with dose dense Adriamycin and Cytoxan x4 followed by Taxol weekly x12started 08/25/2021 2.adjuvant radiation therapy 3.Followed by adjuvant antiestrogen therapy with complete estrogen blockade and abemaciclib ------------------------------------------------------------------------------------------------------------------------ Current treatment: Completed 4 cycles ofdose dense Adriamycin and Cytoxan, today cycle 1 Taxol 08/18/2021: Echocardiogram: EF 55 to 60%  Chemo toxicities: 1.Severe fatigue 2.severe upper respiratory infection/bronchitis/sinus congestion: Treated for flu and bacterial infection 3.Severe bone pain due to Neulasta:She did much better with taking Motrin.   Skin itching:Eczema on both her thighs: Resolved  Diabetes: Appears to be under reasonable control.. Repeat prognostic panel on the lymph node in the final pathology also came back as ER/PR positive  Return to clinic in 1 week for cycle 2 of Taxol and follow-up with me.

## 2021-10-23 ENCOUNTER — Ambulatory Visit: Payer: 59

## 2021-10-24 ENCOUNTER — Ambulatory Visit: Payer: 59 | Admitting: Physical Therapy

## 2021-10-26 ENCOUNTER — Other Ambulatory Visit: Payer: Self-pay

## 2021-10-26 ENCOUNTER — Ambulatory Visit: Payer: 59

## 2021-10-26 DIAGNOSIS — C50811 Malignant neoplasm of overlapping sites of right female breast: Secondary | ICD-10-CM | POA: Diagnosis not present

## 2021-10-26 DIAGNOSIS — Z170 Estrogen receptor positive status [ER+]: Secondary | ICD-10-CM

## 2021-10-26 DIAGNOSIS — M25611 Stiffness of right shoulder, not elsewhere classified: Secondary | ICD-10-CM

## 2021-10-26 DIAGNOSIS — R293 Abnormal posture: Secondary | ICD-10-CM

## 2021-10-26 NOTE — Patient Instructions (Signed)
Over Head Pull: Narrow and Wide Grip   Cancer Rehab [phone]   On back, knees bent, feet flat, band across thighs, elbows straight but relaxed. Pull hands apart (start). Keeping elbows straight, bring arms up and over head, hands toward floor. Keep pull steady on band. Hold momentarily. Return slowly, keeping pull steady, back to start. Then do same with a wider grip on the band (past shoulder width) Repeat _5-10__ times. Band color __yellow____   Side Pull: Double Arm   On back, knees bent, feet flat. Arms perpendicular to body, shoulder level, elbows straight but relaxed. Pull arms out to sides, elbows straight. Resistance band comes across collarbones, hands toward floor. Hold momentarily. Slowly return to starting position. Repeat _5-10__ times. Band color _yellow____   Sword   ** thumb up ** thumb up  On back, knees bent, feet flat, left hand on left hip, right hand above left. Pull right arm DIAGONALLY (hip to shoulder) across chest. Bring right arm along head toward floor. Hold momentarily. Slowly return to starting position. Repeat _5-10__ times. Do with left arm. Band color _yellow_____   Shoulder Rotation: Double Arm     **go half way   On back, knees bent, feet flat, elbows tucked at sides, bent 90, hands palms up. Pull hands apart and down toward floor, keeping elbows near sides. Hold momentarily. Slowly return to starting position. Repeat _5-10__ times. Band color __yellow____

## 2021-10-26 NOTE — Progress Notes (Addendum)
Patient Care Team: Adrian Prince, MD as PCP - General (Endocrinology) Pershing Proud, RN as Oncology Nurse Navigator Donnelly Angelica, RN as Oncology Nurse Navigator  DIAGNOSIS:    ICD-10-CM   1. Malignant neoplasm of overlapping sites of right breast in female, estrogen receptor positive (HCC)  C50.811    Z17.0       SUMMARY OF ONCOLOGIC HISTORY: Oncology History Overview Note  Ductal carcinoma in situ of the right breast  She palpated a lump in her right breast. Diagnostic mammogram and [place] on 06/08/21 showed irregular hypoechoic lesions in the right breast with a single abnormal right axillary lymph node without normal cortex. Biopsy on 06/16/21 showed high grade DCIS with calcifications and necrosis.   Malignant neoplasm of overlapping sites of right breast in female, estrogen receptor positive (HCC)  06/16/2021 Initial Diagnosis   Palpable right breast mass: Breast MRI revealed extensive involvement of the right breast with mass and non-mass enhancement superior right breast mass measures 4.9 x 1.2 cm and together with non-mass enhancement measured 8.8 cm, enhancing mass LOQ 1.3 cm, left breast indeterminate 0.9 cm mass and a 0.8 cm mass UOQ, single abnormal lymph node Biopsy 10:00, 11:00 and 12:00: IDC with DCIS, biopsy right axillary lymph node: IDC, ER 75 to 95%, PR 90 to 95%, Ki-67 25%, HER2 negative on 1 biopsy and 2+ by IHC and FISH pending   06/21/2021 Cancer Staging   Staging form: Breast, AJCC 8th Edition - Clinical stage from 06/21/2021: Stage IIA (cT3, cN1, cM0, G2, ER+, PR+, HER2-) - Signed by [name], MD on 06/21/2021 Histologic grading system: 3 grade system    07/01/2021 Genetic Testing   Negative genetic testing:  No pathogenic variants detected on the [facility] BRCAplus panel (report date 07/01/2021) or the [employer] CancerNext-Expanded + RNAinsight panel (report date 07/01/2021).   The BRCAplus panel offered by [employer] and includes sequencing and  deletion/duplication analysis for the following 8 genes: ATM, BRCA1, BRCA2, CDH1, CHEK2, PALB2, PTEN, and TP53. The CancerNext-Expanded + RNAinsight gene panel offered by [employer] and includes sequencing and rearrangement analysis for the following 77 genes: AIP, ALK, APC, ATM, AXIN2, BAP1, BARD1, BLM, BMPR1A, BRCA1, BRCA2, BRIP1, CDC73, CDH1, CDK4, CDKN1B, CDKN2A, CHEK2, CTNNA1, DICER1, FANCC, FH, FLCN, GALNT12, KIF1B, LZTR1, MAX, MEN1, MET, MLH1, MSH2, MSH3, MSH6, MUTYH, NBN, NF1, NF2, NTHL1, PALB2, PHOX2B, PMS2, POT1, PRKAR1A, PTCH1, PTEN, RAD51C, RAD51D, RB1, RECQL, RET, SDHA, SDHAF2, SDHB, SDHC, SDHD, SMAD4, SMARCA4, SMARCB1, SMARCE1, STK11, SUFU, TMEM127, TP53, TSC1, TSC2, VHL and XRCC2 (sequencing and deletion/duplication); EGFR, EGLN1, HOXB13, KIT, MITF, PDGFRA, POLD1 and POLE (sequencing only); EPCAM and GREM1 (deletion/duplication only). RNA data is routinely analyzed for use in variant interpretation for all genes.   07/25/2021 Surgery   Right mastectomy: Foci of invasive ductal carcinoma measuring 1.1 cm grade 2 with extensive DCIS spanning 8.9 cm, margins negative, 4/7 lymph nodes, ER 75 to 95%, PR 9095%, HER2 negative, Ki-67 25%   08/25/2021 -  Chemotherapy   Patient is on Treatment Plan : BREAST ADJUVANT DOSE DENSE AC q14d / PACLitaxel q7d       CHIEF COMPLIANT: Cycle 2 Taxol  INTERVAL HISTORY: Allison Reed is a 45 y.o. with above-mentioned history of right mastectomy, currently on chemotherapy with Taxol. She presents to the clinic today for treatment.  She tolerated cycle 1 Taxol reasonably well.  She could not tolerate the ice treatment.  She had mild discomfort in the right lower quadrant intermittently but it lasted for 1 to  2 days and went away.  Did not have any constipation.  She had 1 episode of diarrhea.  Did not have any nausea or vomiting.  Did not have any respiratory symptoms.  ALLERGIES:  is allergic to betadine [povidone iodine] and tape.  MEDICATIONS:  Current  Outpatient Medications  Medication Sig Dispense Refill   Empagliflozin-metFORMIN HCl ER (SYNJARDY XR) 09-999 MG TB24 Take by mouth.     lidocaine-prilocaine (EMLA) cream Apply to affected area once 30 g 3   LINAGLIPTIN PO Take by mouth.     ondansetron (ZOFRAN) 8 MG tablet Take 1 tablet (8 mg total) by mouth 2 (two) times daily as needed. Start on the third day after chemotherapy. 30 tablet 1   prochlorperazine (COMPAZINE) 10 MG tablet Take 1 tablet (10 mg total) by mouth every 6 (six) hours as needed (Nausea or vomiting). 30 tablet 1   sitaGLIPtin (JANUVIA) 100 MG tablet Take 100 mg by mouth daily.     No current facility-administered medications for this visit.    PHYSICAL EXAMINATION: ECOG PERFORMANCE STATUS: 1 - Symptomatic but completely ambulatory  Vitals:   10/27/21 1026  BP: 106/70  Pulse: 97  Resp: 16  Temp: (!) 97.5 F (36.4 C)  SpO2: 100%   Filed Weights   10/27/21 1026  Weight: 149 lb 4.8 oz (67.7 kg)    LABORATORY DATA:  I have reviewed the data as listed CMP Latest Ref Rng & Units 10/20/2021 10/06/2021 09/21/2021  Glucose 70 - 99 mg/dL 175(H) 181(H) 135(H)  BUN 6 - 20 mg/dL $Remove'7 9 10  'eZbgScd$ Creatinine 0.44 - 1.00 mg/dL 0.60 0.64 0.34(L)  Sodium 135 - 145 mmol/L 138 136 135  Potassium 3.5 - 5.1 mmol/L 4.2 4.2 4.1  Chloride 98 - 111 mmol/L 105 103 100  CO2 22 - 32 mmol/L $RemoveB'22 22 24  'FyQqfQXk$ Calcium 8.9 - 10.3 mg/dL 9.0 9.0 9.5  Total Protein 6.5 - 8.1 g/dL 6.5 6.7 6.8  Total Bilirubin 0.3 - 1.2 mg/dL 0.3 0.4 0.6  Alkaline Phos 38 - 126 U/L 73 85 78  AST 15 - 41 U/L $Remo'16 19 27  'tQhwk$ ALT 0 - 44 U/L $Remo'19 26 31    'aCeGV$ Lab Results  Component Value Date   WBC 4.9 10/27/2021   HGB 10.3 (L) 10/27/2021   HCT 31.2 (L) 10/27/2021   MCV 81.9 10/27/2021   PLT 300 10/27/2021   NEUTROABS 3.2 10/27/2021    ASSESSMENT & PLAN:  Malignant neoplasm of overlapping sites of right breast in female, estrogen receptor positive ([place]) 07/25/2021:Right mastectomy: Foci of invasive ductal carcinoma  measuring 1.1 cm grade 2 with extensive DCIS spanning 8.9 cm, margins negative, 4/7 lymph nodes, ER 75 to 95%, PR 9095%, HER2 negative, Ki-67 25% Genetics: Negative MammaPrint: Low risk (this is not a valid test for 4+ lymph nodes)   Treatment plan: 1.  Adjuvant chemotherapy with dose dense Adriamycin and Cytoxan x4 followed by Taxol weekly x12 started 08/25/2021 2. adjuvant radiation therapy 3.  Followed by adjuvant antiestrogen therapy with complete estrogen blockade and abemaciclib ------------------------------------------------------------------------------------------------------------------------ Current treatment: Completed 4 cycles of dose dense Adriamycin and Cytoxan, today cycle 2 Taxol 08/18/2021: Echocardiogram: EF 55 to 60%   Chemo toxicities: 1.  Mild fatigue Patient could not tolerate ice treatment therefore we decided to use gloves instead.  She will bring compression stockings to the next treatment so that we can use compression as a form of prevention of neuropathy.    Skin itching: Eczema on both her thighs: Resolved  Diabetes: Appears to be under reasonable control.. Repeat prognostic panel on the lymph node in the final pathology also came back as ER/PR positive   Return to clinic Weekly for Taxol and every other week for follow-up with me    No orders of the defined types were placed in this encounter.  The patient has a good understanding of the overall plan. she agrees with it. she will call with any problems that may develop before the next visit here.  Total time spent: 30 mins including face to face time and time spent for planning, charting and coordination of care  Rulon Eisenmenger, MD, MPH 10/27/2021  I, Thana Ates, am acting as scribe for Dr. [name].  I have reviewed the above documentation for accuracy and completeness, and I agree with the above.

## 2021-10-26 NOTE — Therapy (Signed)
[facility] @ [address] [place], [place], [address] Phone: [phone]   Fax:  [phone]  Physical Therapy Treatment  Patient Details  Name: Allison Reed MRN: [id number] Date of Birth: [birth year] Referring Provider (PT): Dr. Donne Hazel   Encounter Date: 10/26/2021   PT End of Session - 10/26/21 0817     Visit Number 7    Number of Visits 10    Date for PT Re-Evaluation 11/14/21    PT Start Time 0815    PT Stop Time 0853    PT Time Calculation (min) 38 min    Activity Tolerance Patient tolerated treatment well    Behavior During Therapy [facility] for tasks assessed/performed             Past Medical History:  Diagnosis Date   Diabetes mellitus without complication ([place])    Type 2   Family history of brain tumor    Family history of skin cancer    Gestational diabetes    Headache    otc med prn   Heartburn in pregnancy    Incompetent cervix in pregnancy 04/2015   Missed abortion    no surgery required   Postpartum care following cesarean delivery (11/2) 10/12/2015   right breast ca 06/2021   UTI in pregnancy 03/2015   recent UTI, treatment completed    Past Surgical History:  Procedure Laterality Date   APPENDECTOMY  2006   BREAST RECONSTRUCTION WITH PLACEMENT OF TISSUE EXPANDER AND ALLODERM Right 07/25/2021   Procedure: RIGHT BREAST RECONSTRUCTION WITH PLACEMENT OF TISSUE EXPANDER AND ALLODERM;  Surgeon: Irene Limbo, MD;  Location: [facility];  Service: Plastics;  Laterality: Right;   CERCLAGE LAPAROSCOPIC ABDOMINAL N/A 04/15/2015   Procedure: LAPAROSCOPIC TRANSABDOMINAL CERVCOISTHMIC CERCLAGE;  Surgeon: Governor Specking, MD;  Location: [facility];  Service: Gynecology;  Laterality: N/A;   CERVICAL CERCLAGE  02/2011   vaginal   CESAREAN SECTION N/A 10/12/2015   Procedure: CESAREAN SECTION;  Surgeon: Princess Bruins, MD;  Location: [facility];  Service: Obstetrics;  Laterality: N/A;   CESAREAN  SECTION N/A 03/03/2018   Procedure: Repeat CESAREAN SECTION/Removal of Abdominal Cerclage;  Surgeon: Azucena Fallen, MD;  Location: [facility];  Service: Obstetrics;  Laterality: N/A;  EDD: 03/28/18   MASTECTOMY W/ SENTINEL NODE BIOPSY Right 07/25/2021   Procedure: RIGHT MASTECTOMY WITH AXILLARY SENTINEL LYMPH NODE BIOPSY;  Surgeon: [name], MD;  Location: [facility];  Service: General;  Laterality: Right;   PORTACATH PLACEMENT N/A 08/24/2021   Procedure: INSERTION PORT-A-CATH;  Surgeon: [name], MD;  Location: [facility];  Service: General;  Laterality: N/A;   RADIOACTIVE SEED GUIDED AXILLARY SENTINEL LYMPH NODE Right 07/25/2021   Procedure: RADIOACTIVE SEED GUIDED RIGHT AXILLARY NODE EXCISION;  Surgeon: [name], MD;  Location: [facility];  Service: General;  Laterality: Right;   [place] EXTRACTION  2001    There were no vitals filed for this visit.   Subjective Assessment - 10/26/21 0814     Subjective The arm is still numb so its hard to distinguish cording from numbness.  Cording seems a little better after Mateo Flow worked on it.  I was sore afterwards.    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 She noted a palpable right breast mass. This is followed by a mammogram with a mass with calcifications as really throughout the upper outer quadrant and in the central breast. This  measures 9.4 x 8.7 x 6.7 cm. On ultrasound she had 3 separate masses at 10, 11, and 12:00. The sizes are 1.7, 2.1, and 2.2 cm. There is also a single abnormal axillary lymph node that has undergone biopsy. She also has undergone an MRI that shows extensive right breast disease with both mass and non-mass enhancement involving the majority of the superior and upper outer right breast with relative sparing of the lower inner quadrant.Biopsy of the right breast lesions or a grade 2 invasive ductal carcinoma that  is very ER, PR positive, HER2 negative, and Ki-67 is 25%. She is s/p right breast mastectomy with SLNB 0/7 LN on August 16 andl also had tissue expanders in place.  She is currently undergoing chemotherapy for 6 months every other week and radiation will follow    Patient Stated Goals Reassess after surgery    Currently in Pain? Yes    Pain Score 0-No pain    Multiple Pain Sites No                               [facility] Adult PT Treatment/Exercise - 10/26/21 0001       Shoulder Exercises: Supine   Horizontal ABduction Strengthening;Both;10 reps    Theraband Level (Shoulder Horizontal ABduction) Level 1 (Yellow)    External Rotation Strengthening;Both;10 reps    Theraband Level (Shoulder External Rotation) Level 1 (Yellow)    Flexion Strengthening;Both;10 reps    Theraband Level (Shoulder Flexion) Level 1 (Yellow)    Diagonals Strengthening;10 reps    Theraband Level (Shoulder Diagonals) Level 1 (Yellow)      Shoulder Exercises: Standing   Retraction Strengthening;Both;10 reps    Theraband Level (Shoulder Retraction) Level 1 (Yellow)    Other Standing Exercises Jobes felxion and scaption x 10      Shoulder Exercises: Pulleys   Flexion 1 minute    Scaption 1 minute    ABduction 1 minute                          PT Long Term Goals - 09/19/21 1010       PT LONG TERM GOAL #1   Title Pt will achieve full right shoulder ROM post surgery for full return to daily activities    Time 8    Period Weeks    Status New    Target Date 11/14/21      PT LONG TERM GOAL #2   Title Pt will improve right shoulder flexion and scaption by atleast 15 degrees    Baseline flexion 140,  abd 150    Time 4    Period Weeks    Status New    Target Date 10/17/21      PT LONG TERM GOAL #3   Title Pt will report decrease achiness and pulling from cording by 50% or more    Time 8    Period Weeks    Status New    Target Date 11/14/21      PT LONG TERM GOAL #4    Title Pt will be able to perform arm movements to continue with her indian dancing    Time 8    Period Weeks    Status New    Target Date 11/14/21                   Plan - 10/26/21 0938  Clinical Impression Statement continued MFR to cording in the right UE with cording overall much improved.  She has excellent ROM. Initiated supine scapular series and updated HEP with pictures.  Doing very well overall. Ready to start ABC strength handout next. May still require some manual for cording    Personal Factors and Comorbidities Comorbidity 1    Comorbidities right breast cancer s/p mastectomy  presently having chemo and pending and radiation    Stability/Clinical Decision Making Stable/Uncomplicated    Rehab Potential Excellent    PT Frequency 2x / week    PT Duration 8 weeks    PT Treatment/Interventions ADLs/Self Care Home Management;Therapeutic exercise;Manual techniques;Patient/family education;Manual lymph drainage;Scar mobilization;Passive range of motion    PT Next Visit Plan Check goals,Progress pt with strengthening Strength ABC program; manual therapy prn for cording.    PT Home Exercise Plan 4 post op exs , supine wand flexion and scaption; modified downward dog on wall, supine scapular series    Recommended Other Services ABC 10/30/21    Consulted and Agree with Plan of Care Patient             Patient will benefit from skilled therapeutic intervention in order to improve the following deficits and impairments:  Postural dysfunction, Decreased knowledge of precautions, Decreased scar mobility, Increased fascial restricitons, Decreased range of motion  Visit Diagnosis: Malignant neoplasm of overlapping sites of right breast in female, estrogen receptor positive ([place])  Abnormal posture  Stiffness of right shoulder, not elsewhere classified     Problem List Patient Active Problem List   Diagnosis Date Noted   Port-A-Cath in place 08/25/2021   S/P  mastectomy, right 07/25/2021   Genetic testing 07/01/2021   Cancer ([place]) 06/26/2021   Family history of skin cancer 06/22/2021   Family history of brain tumor 06/22/2021   Malignant neoplasm of overlapping sites of right breast in female, estrogen receptor positive ([place]) 06/21/2021   Premature uterine contractions causing threatened premature labor in third trimester 03/03/2018   Previous cesarean delivery, delivered 03/03/2018   Postpartum care following cesarean delivery (3/25) 03/03/2018   Status post repeat low transverse cesarean section 03/03/2018   DM (diabetes mellitus) in pregnancy 03/02/2018   Incompetent cervix in pregnancy 03/02/2018   Premature uterine contractions in third trimester, antepartum 03/02/2018   Active labor 10/12/2015    Claris Pong, PT 10/26/2021, 8:58 AM  [facility] @ [address] [place], [place], [address] Phone: [phone]   Fax:  [phone]  Name: CATRENA VARI MRN: [id number] Date of Birth: 24-Apr-1976

## 2021-10-27 ENCOUNTER — Inpatient Hospital Stay (HOSPITAL_BASED_OUTPATIENT_CLINIC_OR_DEPARTMENT_OTHER): Payer: 59 | Admitting: Hematology and Oncology

## 2021-10-27 ENCOUNTER — Inpatient Hospital Stay: Payer: 59

## 2021-10-27 DIAGNOSIS — Z95828 Presence of other vascular implants and grafts: Secondary | ICD-10-CM

## 2021-10-27 DIAGNOSIS — C50811 Malignant neoplasm of overlapping sites of right female breast: Secondary | ICD-10-CM

## 2021-10-27 DIAGNOSIS — Z170 Estrogen receptor positive status [ER+]: Secondary | ICD-10-CM

## 2021-10-27 LAB — CMP (CANCER CENTER ONLY)
ALT: 34 U/L (ref 0–44)
AST: 27 U/L (ref 15–41)
Albumin: 4.2 g/dL (ref 3.5–5.0)
Alkaline Phosphatase: 58 U/L (ref 38–126)
Anion gap: 10 (ref 5–15)
BUN: 9 mg/dL (ref 6–20)
CO2: 22 mmol/L (ref 22–32)
Calcium: 9 mg/dL (ref 8.9–10.3)
Chloride: 106 mmol/L (ref 98–111)
Creatinine: 0.59 mg/dL (ref 0.44–1.00)
GFR, Estimated: >60 mL/min (ref >60)
Glucose, Bld: 147 mg/dL — ABNORMAL HIGH (ref 70–99)
Potassium: 3.8 mmol/L (ref 3.5–5.1)
Sodium: 138 mmol/L (ref 135–145)
Total Bilirubin: 0.3 mg/dL (ref 0.3–1.2)
Total Protein: 6.8 g/dL (ref 6.5–8.1)

## 2021-10-27 LAB — CBC WITH DIFFERENTIAL (CANCER CENTER ONLY)
Abs Immature Granulocytes: 0.05 10*3/uL (ref 0.00–0.07)
Basophils Absolute: 0 10*3/uL (ref 0.0–0.1)
Basophils Relative: 1 %
Eosinophils Absolute: 0.1 10*3/uL (ref 0.0–0.5)
Eosinophils Relative: 1 %
HCT: 31.2 % — ABNORMAL LOW (ref 36.0–46.0)
Hemoglobin: 10.3 g/dL — ABNORMAL LOW (ref 12.0–15.0)
Immature Granulocytes: 1 %
Lymphocytes Relative: 23 %
Lymphs Abs: 1.2 10*3/uL (ref 0.7–4.0)
MCH: 27 pg (ref 26.0–34.0)
MCHC: 33 g/dL (ref 30.0–36.0)
MCV: 81.9 fL (ref 80.0–100.0)
Monocytes Absolute: 0.4 10*3/uL (ref 0.1–1.0)
Monocytes Relative: 9 %
Neutro Abs: 3.2 10*3/uL (ref 1.7–7.7)
Neutrophils Relative %: 65 %
Platelet Count: 300 10*3/uL (ref 150–400)
RBC: 3.81 MIL/uL — ABNORMAL LOW (ref 3.87–5.11)
RDW: 17.9 % — ABNORMAL HIGH (ref 11.5–15.5)
WBC Count: 4.9 10*3/uL (ref 4.0–10.5)
nRBC: 0.4 % — ABNORMAL HIGH (ref 0.0–0.2)

## 2021-10-27 MED ORDER — DEXAMETHASONE SODIUM PHOSPHATE 10 MG/ML IJ SOLN
4.0000 mg | Freq: Once | INTRAMUSCULAR | Status: AC
  Administered 2021-10-27: 4 mg via INTRAVENOUS
  Filled 2021-10-27: qty 1

## 2021-10-27 MED ORDER — SODIUM CHLORIDE 0.9% FLUSH
10.0000 mL | Freq: Once | INTRAVENOUS | Status: AC
  Administered 2021-10-27: 10 mL

## 2021-10-27 MED ORDER — SODIUM CHLORIDE 0.9 % IV SOLN
80.0000 mg/m2 | Freq: Once | INTRAVENOUS | Status: AC
  Administered 2021-10-27: 144 mg via INTRAVENOUS
  Filled 2021-10-27: qty 24

## 2021-10-27 MED ORDER — SODIUM CHLORIDE 0.9 % IV SOLN: Freq: Once | INTRAVENOUS | Status: AC

## 2021-10-27 MED ORDER — FAMOTIDINE 20 MG IN NS 100 ML IVPB
20.0000 mg | Freq: Once | INTRAVENOUS | Status: AC
  Administered 2021-10-27: 20 mg via INTRAVENOUS
  Filled 2021-10-27: qty 100

## 2021-10-27 MED ORDER — SODIUM CHLORIDE 0.9% FLUSH
10.0000 mL | INTRAVENOUS | Status: DC | PRN
  Administered 2021-10-27: 10 mL

## 2021-10-27 MED ORDER — DIPHENHYDRAMINE HCL 50 MG/ML IJ SOLN
25.0000 mg | Freq: Once | INTRAMUSCULAR | Status: AC
  Administered 2021-10-27: 25 mg via INTRAVENOUS
  Filled 2021-10-27: qty 1

## 2021-10-27 MED ORDER — HEPARIN SOD (PORK) LOCK FLUSH 100 UNIT/ML IV SOLN
500.0000 [IU] | Freq: Once | INTRAVENOUS | Status: AC | PRN
  Administered 2021-10-27: 500 [IU]

## 2021-10-27 NOTE — Assessment & Plan Note (Signed)
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, margins negative, 4/7 lymph nodes, ER 75 to 95%, PR 9095%, HER2 negative, Ki-67 25% Genetics: Negative MammaPrint: Low risk (this is not a valid test for 4+ lymph nodes)  Treatment plan: 1.Adjuvant chemotherapy with dose dense Adriamycin and Cytoxan x4 followed by Taxol weekly x12started 08/25/2021 2.adjuvant radiation therapy 3.Followed by adjuvant antiestrogen therapy with complete estrogen blockade and abemaciclib ------------------------------------------------------------------------------------------------------------------------ Current treatment: Completed 4 cycles ofdose dense Adriamycin and Cytoxan, today cycle 2 Taxol 08/18/2021: Echocardiogram: EF 55 to 60%  Chemo toxicities: 1.Severe fatigue 2. Heavy menstrual bleed: Because significantly heavier bleeding this time.  Her hemoglobin dipped to 9.8 because of that.  Skin itching:Eczema on both her thighs: Resolved  Diabetes: Appears to be under reasonable control.. Repeat prognostic panel on the lymph node in the final pathology also came back as ER/PR positive  Return to clinic in 1 week for cycle 2 of Taxol and follow-up with me.  Weekly for Taxol and every other week for follow-up with me

## 2021-10-27 NOTE — Patient Instructions (Signed)
[facility]  Discharge Instructions: Thank you for choosing [facility] to provide your oncology and hematology care.   If you have a lab appointment with the [facility], please go directly to the [facility] and check in at the registration area.   Wear comfortable clothing and clothing appropriate for easy access to any Portacath or PICC line.   We strive to give you quality time with your provider. You may need to reschedule your appointment if you arrive late (15 or more minutes).  Arriving late affects you and other patients whose appointments are after yours.  Also, if you miss three or more appointments without notifying the office, you may be dismissed from the clinic at the provider's discretion.      For prescription refill requests, have your pharmacy contact our office and allow 72 hours for refills to be completed.    Today you received the following chemotherapy and/or immunotherapy agents: Taxol    To help prevent nausea and vomiting after your treatment, we encourage you to take your nausea medication as directed.  BELOW ARE SYMPTOMS THAT SHOULD BE REPORTED IMMEDIATELY: *FEVER GREATER THAN 100.4 F (38 C) OR HIGHER *CHILLS OR SWEATING *NAUSEA AND VOMITING THAT IS NOT CONTROLLED WITH YOUR NAUSEA MEDICATION *UNUSUAL SHORTNESS OF BREATH *UNUSUAL BRUISING OR BLEEDING *URINARY PROBLEMS (pain or burning when urinating, or frequent urination) *BOWEL PROBLEMS (unusual diarrhea, constipation, pain near the anus) TENDERNESS IN MOUTH AND THROAT WITH OR WITHOUT PRESENCE OF ULCERS (sore throat, sores in mouth, or a toothache) UNUSUAL RASH, SWELLING OR PAIN  UNUSUAL VAGINAL DISCHARGE OR ITCHING   Items with * indicate a potential emergency and should be followed up as soon as possible or go to the Emergency Department if any problems should occur.  Please show the CHEMOTHERAPY ALERT CARD or IMMUNOTHERAPY ALERT CARD at check-in to the  Emergency Department and triage nurse.  Should you have questions after your visit or need to cancel or reschedule your appointment, please contact [facility]  Dept: [phone]  and follow the prompts.  Office hours are 8:00 a.m. to 4:30 p.m. Monday - Friday. Please note that voicemails left after 4:00 p.m. may not be returned until the following business day.  We are closed weekends and major holidays. You have access to a nurse at all times for urgent questions. Please call the main number to the clinic Dept: [phone] and follow the prompts.   For any non-urgent questions, you may also contact your provider using MyChart. We now offer e-Visits for anyone 70 and older to request care online for non-urgent symptoms. For details visit [email or web address].   Also download the MyChart app! Go to the app store, search "MyChart", open the app, select [facility], and log in with your MyChart username and password.  Due to Covid, a mask is required upon entering the hospital/clinic. If you do not have a mask, one will be given to you upon arrival. For doctor visits, patients may have 1 support person aged 25 or older with them. For treatment visits, patients cannot have anyone with them due to current Covid guidelines and our immunocompromised population.

## 2021-10-27 NOTE — Progress Notes (Signed)
Pt used compression therapy today during treatment instead of ice and tolerated it well.

## 2021-11-03 ENCOUNTER — Other Ambulatory Visit: Payer: Self-pay

## 2021-11-03 ENCOUNTER — Inpatient Hospital Stay: Payer: 59

## 2021-11-03 VITALS — BP 100/78 | HR 94 | Temp 98.5°F | Resp 18 | Wt 148.8 lb

## 2021-11-03 DIAGNOSIS — C50811 Malignant neoplasm of overlapping sites of right female breast: Secondary | ICD-10-CM

## 2021-11-03 DIAGNOSIS — Z95828 Presence of other vascular implants and grafts: Secondary | ICD-10-CM

## 2021-11-03 DIAGNOSIS — Z170 Estrogen receptor positive status [ER+]: Secondary | ICD-10-CM

## 2021-11-03 LAB — CMP (CANCER CENTER ONLY)
ALT: 23 U/L (ref 0–44)
AST: 19 U/L (ref 15–41)
Albumin: 3.9 g/dL (ref 3.5–5.0)
Alkaline Phosphatase: 57 U/L (ref 38–126)
Anion gap: 9 (ref 5–15)
BUN: 10 mg/dL (ref 6–20)
CO2: 25 mmol/L (ref 22–32)
Calcium: 9.3 mg/dL (ref 8.9–10.3)
Chloride: 105 mmol/L (ref 98–111)
Creatinine: 0.61 mg/dL (ref 0.44–1.00)
GFR, Estimated: >60 mL/min (ref >60)
Glucose, Bld: 164 mg/dL — ABNORMAL HIGH (ref 70–99)
Potassium: 3.6 mmol/L (ref 3.5–5.1)
Sodium: 139 mmol/L (ref 135–145)
Total Bilirubin: 0.3 mg/dL (ref 0.3–1.2)
Total Protein: 6.9 g/dL (ref 6.5–8.1)

## 2021-11-03 LAB — CBC WITH DIFFERENTIAL (CANCER CENTER ONLY)
Abs Immature Granulocytes: 0.02 10*3/uL (ref 0.00–0.07)
Basophils Absolute: 0 10*3/uL (ref 0.0–0.1)
Basophils Relative: 1 %
Eosinophils Absolute: 0.1 10*3/uL (ref 0.0–0.5)
Eosinophils Relative: 2 %
HCT: 31 % — ABNORMAL LOW (ref 36.0–46.0)
Hemoglobin: 10.2 g/dL — ABNORMAL LOW (ref 12.0–15.0)
Immature Granulocytes: 1 %
Lymphocytes Relative: 27 %
Lymphs Abs: 1.1 10*3/uL (ref 0.7–4.0)
MCH: 27.2 pg (ref 26.0–34.0)
MCHC: 32.9 g/dL (ref 30.0–36.0)
MCV: 82.7 fL (ref 80.0–100.0)
Monocytes Absolute: 0.4 10*3/uL (ref 0.1–1.0)
Monocytes Relative: 9 %
Neutro Abs: 2.5 10*3/uL (ref 1.7–7.7)
Neutrophils Relative %: 60 %
Platelet Count: 259 10*3/uL (ref 150–400)
RBC: 3.75 MIL/uL — ABNORMAL LOW (ref 3.87–5.11)
RDW: 18.7 % — ABNORMAL HIGH (ref 11.5–15.5)
WBC Count: 4.1 10*3/uL (ref 4.0–10.5)
nRBC: 0 % (ref 0.0–0.2)

## 2021-11-03 MED ORDER — SODIUM CHLORIDE 0.9% FLUSH
10.0000 mL | INTRAVENOUS | Status: DC | PRN
  Administered 2021-11-03: 10 mL

## 2021-11-03 MED ORDER — SODIUM CHLORIDE 0.9 % IV SOLN: Freq: Once | INTRAVENOUS | Status: AC

## 2021-11-03 MED ORDER — ALTEPLASE 2 MG IJ SOLR
2.0000 mg | Freq: Once | INTRAMUSCULAR | Status: AC
  Administered 2021-11-03: 2 mg
  Filled 2021-11-03: qty 2

## 2021-11-03 MED ORDER — SODIUM CHLORIDE 0.9% FLUSH
10.0000 mL | Freq: Once | INTRAVENOUS | Status: AC
  Administered 2021-11-03: 10 mL

## 2021-11-03 MED ORDER — DIPHENHYDRAMINE HCL 50 MG/ML IJ SOLN
25.0000 mg | Freq: Once | INTRAMUSCULAR | Status: AC
  Administered 2021-11-03: 25 mg via INTRAVENOUS
  Filled 2021-11-03: qty 1

## 2021-11-03 MED ORDER — HEPARIN SOD (PORK) LOCK FLUSH 100 UNIT/ML IV SOLN
500.0000 [IU] | Freq: Once | INTRAVENOUS | Status: AC | PRN
  Administered 2021-11-03: 500 [IU]

## 2021-11-03 MED ORDER — DEXAMETHASONE SODIUM PHOSPHATE 10 MG/ML IJ SOLN
4.0000 mg | Freq: Once | INTRAMUSCULAR | Status: AC
  Administered 2021-11-03: 4 mg via INTRAVENOUS
  Filled 2021-11-03: qty 1

## 2021-11-03 MED ORDER — FAMOTIDINE 20 MG IN NS 100 ML IVPB
20.0000 mg | Freq: Once | INTRAVENOUS | Status: AC
  Administered 2021-11-03: 20 mg via INTRAVENOUS
  Filled 2021-11-03: qty 100

## 2021-11-03 MED ORDER — SODIUM CHLORIDE 0.9 % IV SOLN
80.0000 mg/m2 | Freq: Once | INTRAVENOUS | Status: AC
  Administered 2021-11-03: 144 mg via INTRAVENOUS
  Filled 2021-11-03: qty 24

## 2021-11-07 ENCOUNTER — Other Ambulatory Visit: Payer: Self-pay

## 2021-11-07 ENCOUNTER — Encounter: Payer: Self-pay | Admitting: *Deleted

## 2021-11-07 ENCOUNTER — Ambulatory Visit: Payer: 59

## 2021-11-07 DIAGNOSIS — R293 Abnormal posture: Secondary | ICD-10-CM

## 2021-11-07 DIAGNOSIS — Z170 Estrogen receptor positive status [ER+]: Secondary | ICD-10-CM

## 2021-11-07 DIAGNOSIS — M25611 Stiffness of right shoulder, not elsewhere classified: Secondary | ICD-10-CM

## 2021-11-07 DIAGNOSIS — C50811 Malignant neoplasm of overlapping sites of right female breast: Secondary | ICD-10-CM | POA: Diagnosis not present

## 2021-11-07 NOTE — Therapy (Signed)
[facility] @ [address] [place] [place], [place], [address] Phone: [phone]   Fax:  [phone]  Physical Therapy Treatment  Patient Details  Name: Allison Reed MRN: [id number] Date of Birth: [birth year] Referring Provider (PT): Dr. Donne Hazel   Encounter Date: 11/07/2021   PT End of Session - 11/07/21 0858     Visit Number 8    Number of Visits 10    Date for PT Re-Evaluation 11/14/21    PT Start Time 0813    PT Stop Time 3335    PT Time Calculation (min) 44 min    Activity Tolerance Patient tolerated treatment well    Behavior During Therapy [facility] for tasks assessed/performed             Past Medical History:  Diagnosis Date   Diabetes mellitus without complication ([place])    Type 2   Family history of brain tumor    Family history of skin cancer    Gestational diabetes    Headache    otc med prn   Heartburn in pregnancy    Incompetent cervix in pregnancy 04/2015   Missed abortion    no surgery required   Postpartum care following cesarean delivery (11/2) 10/12/2015   right breast ca 06/2021   UTI in pregnancy 03/2015   recent UTI, treatment completed    Past Surgical History:  Procedure Laterality Date   APPENDECTOMY  2006   BREAST RECONSTRUCTION WITH PLACEMENT OF TISSUE EXPANDER AND ALLODERM Right 07/25/2021   Procedure: RIGHT BREAST RECONSTRUCTION WITH PLACEMENT OF TISSUE EXPANDER AND ALLODERM;  Surgeon: Irene Limbo, MD;  Location: [facility];  Service: Plastics;  Laterality: Right;   CERCLAGE LAPAROSCOPIC ABDOMINAL N/A 04/15/2015   Procedure: LAPAROSCOPIC TRANSABDOMINAL CERVCOISTHMIC CERCLAGE;  Surgeon: Governor Specking, MD;  Location: [facility];  Service: Gynecology;  Laterality: N/A;   CERVICAL CERCLAGE  02/2011   vaginal   CESAREAN SECTION N/A 10/12/2015   Procedure: CESAREAN SECTION;  Surgeon: Princess Bruins, MD;  Location: [facility];  Service: Obstetrics;  Laterality: N/A;   CESAREAN  SECTION N/A 03/03/2018   Procedure: Repeat CESAREAN SECTION/Removal of Abdominal Cerclage;  Surgeon: Azucena Fallen, MD;  Location: [facility];  Service: Obstetrics;  Laterality: N/A;  EDD: 03/28/18   MASTECTOMY W/ SENTINEL NODE BIOPSY Right 07/25/2021   Procedure: RIGHT MASTECTOMY WITH AXILLARY SENTINEL LYMPH NODE BIOPSY;  Surgeon: [name], MD;  Location: [facility];  Service: General;  Laterality: Right;   PORTACATH PLACEMENT N/A 08/24/2021   Procedure: INSERTION PORT-A-CATH;  Surgeon: [name], MD;  Location: [facility];  Service: General;  Laterality: N/A;   RADIOACTIVE SEED GUIDED AXILLARY SENTINEL LYMPH NODE Right 07/25/2021   Procedure: RADIOACTIVE SEED GUIDED RIGHT AXILLARY NODE EXCISION;  Surgeon: [name], MD;  Location: [facility];  Service: General;  Laterality: Right;   [place] EXTRACTION  2001    There were no vitals filed for this visit.   Subjective Assessment - 11/07/21 0819     Subjective I missed the ABC class last Monday bc I ended up sick with a low greade fever after my last chemo the Friday before. So I would like to sign up for the next one. The cording seems a bit worse since then as does my tightness from my Rt axilla down my lateral trunk.    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 She noted a palpable right  breast mass. This is followed by a mammogram with a mass with calcifications as really throughout the upper outer quadrant and in the central breast. This measures 9.4 x 8.7 x 6.7 cm. On ultrasound she had 3 separate masses at 10, 11, and 12:00. The sizes are 1.7, 2.1, and 2.2 cm. There is also a single abnormal axillary lymph node that has undergone biopsy. She also has undergone an MRI that shows extensive right breast disease with both mass and non-mass enhancement involving the majority of the superior and upper outer right breast with relative  sparing of the lower inner quadrant.Biopsy of the right breast lesions or a grade 2 invasive ductal carcinoma that is very ER, PR positive, HER2 negative, and Ki-67 is 25%. She is s/p right breast mastectomy with SLNB 0/7 LN on August 16 andl also had tissue expanders in place.  She is currently undergoing chemotherapy for 6 months every other week and radiation will follow    Patient Stated Goals Reassess after surgery    Currently in Pain? No/denies                               [facility] Adult PT Treatment/Exercise - 11/07/21 0001       Manual Therapy   Soft tissue mobilization Right axilla to lateral border of scapula where palpable tightness and tenderness; then in Lt S/L to Rt medial scapular border/UT/levator all done with cocoa butter    Passive ROM Into Rt shoulder flexion, abduction and D2 during STM (pt with full motion)                          PT Long Term Goals - 09/19/21 1010       PT LONG TERM GOAL #1   Title Pt will achieve full right shoulder ROM post surgery for full return to daily activities    Time 8    Period Weeks    Status New    Target Date 11/14/21      PT LONG TERM GOAL #2   Title Pt will improve right shoulder flexion and scaption by atleast 15 degrees    Baseline flexion 140,  abd 150    Time 4    Period Weeks    Status New    Target Date 10/17/21      PT LONG TERM GOAL #3   Title Pt will report decrease achiness and pulling from cording by 50% or more    Time 8    Period Weeks    Status New    Target Date 11/14/21      PT LONG TERM GOAL #4   Title Pt will be able to perform arm movements to continue with her indian dancing    Time 8    Period Weeks    Status New    Target Date 11/14/21                   Plan - 11/07/21 0859     Clinical Impression Statement Pt comes in reporting feeling tighter since last visit as she had a low grade fever after last Taxol treatment so didn't do much due to not  feeling well. So focused on manual therapy today at Rt axilla to lateral border of scapula, then into Lt S/L for continued STM to medial scapular border where pt palpably very tight and tender, some trigger point  release noted. ALso briefly to Rt UT and levator/ Overall pt reports feeling looser by end of sesison and wants to be on hold for next few weeks and will call if tightness returns. She feels independent with supine scapular series issued at last session.    Personal Factors and Comorbidities Comorbidity 1    Comorbidities right breast cancer s/p mastectomy  presently having chemo and pending and radiation    Stability/Clinical Decision Making Stable/Uncomplicated    Rehab Potential Excellent    PT Frequency 2x / week    PT Duration 8 weeks    PT Treatment/Interventions ADLs/Self Care Home Management;Therapeutic exercise;Manual techniques;Patient/family education;Manual lymph drainage;Scar mobilization;Passive range of motion    PT Next Visit Plan Pt on hold, will call if her tightness worsens in Rt upper quadrant; if she returns renewal needed and cont progress pt with strengthening Strength ABC program; manual therapy prn for cording.    PT Home Exercise Plan 4 post op exs , supine wand flexion and scaption; modified downward dog on wall, supine scapular series    Consulted and Agree with Plan of Care Patient             Patient will benefit from skilled therapeutic intervention in order to improve the following deficits and impairments:  Postural dysfunction, Decreased knowledge of precautions, Decreased scar mobility, Increased fascial restricitons, Decreased range of motion  Visit Diagnosis: Malignant neoplasm of overlapping sites of right breast in female, estrogen receptor positive ([place])  Abnormal posture  Stiffness of right shoulder, not elsewhere classified     Problem List Patient Active Problem List   Diagnosis Date Noted   Port-A-Cath in place 08/25/2021   S/P  mastectomy, right 07/25/2021   Genetic testing 07/01/2021   Cancer ([place]) 06/26/2021   Family history of skin cancer 06/22/2021   Family history of brain tumor 06/22/2021   Malignant neoplasm of overlapping sites of right breast in female, estrogen receptor positive ([place]) 06/21/2021   Premature uterine contractions causing threatened premature labor in third trimester 03/03/2018   Previous cesarean delivery, delivered 03/03/2018   Postpartum care following cesarean delivery (3/25) 03/03/2018   Status post repeat low transverse cesarean section 03/03/2018   DM (diabetes mellitus) in pregnancy 03/02/2018   Incompetent cervix in pregnancy 03/02/2018   Premature uterine contractions in third trimester, antepartum 03/02/2018   Active labor 10/12/2015    Otelia Limes, PTA 11/07/2021, 9:02 AM  [facility] @ [address] [place], [place], [address] Phone: [phone]   Fax:  [phone]  Name: Allison Reed MRN: [id number] Date of Birth: 11-22-1976

## 2021-11-09 NOTE — Progress Notes (Signed)
Patient Care Team: Adrian Prince, MD as PCP - General (Endocrinology) Pershing Proud, RN as Oncology Nurse Navigator Donnelly Angelica, RN as Oncology Nurse Navigator  DIAGNOSIS:    ICD-10-CM   1. Malignant neoplasm of overlapping sites of right breast in female, estrogen receptor positive (HCC)  C50.811    Z17.0       SUMMARY OF ONCOLOGIC HISTORY: Oncology History Overview Note  Ductal carcinoma in situ of the right breast  She palpated a lump in her right breast. Diagnostic mammogram and [place] on 06/08/21 showed irregular hypoechoic lesions in the right breast with a single abnormal right axillary lymph node without normal cortex. Biopsy on 06/16/21 showed high grade DCIS with calcifications and necrosis.   Malignant neoplasm of overlapping sites of right breast in female, estrogen receptor positive (HCC)  06/16/2021 Initial Diagnosis   Palpable right breast mass: Breast MRI revealed extensive involvement of the right breast with mass and non-mass enhancement superior right breast mass measures 4.9 x 1.2 cm and together with non-mass enhancement measured 8.8 cm, enhancing mass LOQ 1.3 cm, left breast indeterminate 0.9 cm mass and a 0.8 cm mass UOQ, single abnormal lymph node Biopsy 10:00, 11:00 and 12:00: IDC with DCIS, biopsy right axillary lymph node: IDC, ER 75 to 95%, PR 90 to 95%, Ki-67 25%, HER2 negative on 1 biopsy and 2+ by IHC and FISH pending   06/21/2021 Cancer Staging   Staging form: Breast, AJCC 8th Edition - Clinical stage from 06/21/2021: Stage IIA (cT3, cN1, cM0, G2, ER+, PR+, HER2-) - Signed by [name], MD on 06/21/2021 Histologic grading system: 3 grade system    07/01/2021 Genetic Testing   Negative genetic testing:  No pathogenic variants detected on the [facility] BRCAplus panel (report date 07/01/2021) or the [employer] CancerNext-Expanded + RNAinsight panel (report date 07/01/2021).   The BRCAplus panel offered by [employer] and includes sequencing and  deletion/duplication analysis for the following 8 genes: ATM, BRCA1, BRCA2, CDH1, CHEK2, PALB2, PTEN, and TP53. The CancerNext-Expanded + RNAinsight gene panel offered by [employer] and includes sequencing and rearrangement analysis for the following 77 genes: AIP, ALK, APC, ATM, AXIN2, BAP1, BARD1, BLM, BMPR1A, BRCA1, BRCA2, BRIP1, CDC73, CDH1, CDK4, CDKN1B, CDKN2A, CHEK2, CTNNA1, DICER1, FANCC, FH, FLCN, GALNT12, KIF1B, LZTR1, MAX, MEN1, MET, MLH1, MSH2, MSH3, MSH6, MUTYH, NBN, NF1, NF2, NTHL1, PALB2, PHOX2B, PMS2, POT1, PRKAR1A, PTCH1, PTEN, RAD51C, RAD51D, RB1, RECQL, RET, SDHA, SDHAF2, SDHB, SDHC, SDHD, SMAD4, SMARCA4, SMARCB1, SMARCE1, STK11, SUFU, TMEM127, TP53, TSC1, TSC2, VHL and XRCC2 (sequencing and deletion/duplication); EGFR, EGLN1, HOXB13, KIT, MITF, PDGFRA, POLD1 and POLE (sequencing only); EPCAM and GREM1 (deletion/duplication only). RNA data is routinely analyzed for use in variant interpretation for all genes.   07/25/2021 Surgery   Right mastectomy: Foci of invasive ductal carcinoma measuring 1.1 cm grade 2 with extensive DCIS spanning 8.9 cm, margins negative, 4/7 lymph nodes, ER 75 to 95%, PR 9095%, HER2 negative, Ki-67 25%   08/25/2021 -  Chemotherapy   Patient is on Treatment Plan : BREAST ADJUVANT DOSE DENSE AC q14d / PACLitaxel q7d       CHIEF COMPLIANT: Cycle 4 Taxol  INTERVAL HISTORY: Allison Reed is a 45 y.o. with above-mentioned history of right mastectomy, currently on chemotherapy with Taxol. She presents to the clinic today for treatment.  Apart from fatigue she tolerated cycle 3 reasonably well.  She did have diarrhea.  After second cycle she had upper respiratory infection.  It appears that she has done much better  with the last cycle of chemo.  Did not have any nausea or vomiting.  She has difficulty falling asleep.  She tried melatonin which did not help.  ALLERGIES:  is allergic to betadine [povidone iodine] and tape.  MEDICATIONS:  Current Outpatient  Medications  Medication Sig Dispense Refill   Empagliflozin-metFORMIN HCl ER (SYNJARDY XR) 09-999 MG TB24 Take by mouth.     lidocaine-prilocaine (EMLA) cream Apply to affected area once 30 g 3   sitaGLIPtin (JANUVIA) 100 MG tablet Take 100 mg by mouth daily.     No current facility-administered medications for this visit.    PHYSICAL EXAMINATION: ECOG PERFORMANCE STATUS: 1 - Symptomatic but completely ambulatory  Vitals:   11/10/21 0827  BP: 95/64  Pulse: (!) 104  Resp: 17  Temp: 98.6 F (37 C)  SpO2: 100%   Filed Weights   11/10/21 0827  Weight: 149 lb 14.4 oz (68 kg)    LABORATORY DATA:  I have reviewed the data as listed CMP Latest Ref Rng & Units 11/03/2021 10/27/2021 10/20/2021  Glucose 70 - 99 mg/dL 164(H) 147(H) 175(H)  BUN 6 - 20 mg/dL $Remove'10 9 7  'nigJyjJ$ Creatinine 0.44 - 1.00 mg/dL 0.61 0.59 0.60  Sodium 135 - 145 mmol/L 139 138 138  Potassium 3.5 - 5.1 mmol/L 3.6 3.8 4.2  Chloride 98 - 111 mmol/L 105 106 105  CO2 22 - 32 mmol/L $RemoveB'25 22 22  'ojbRCsmW$ Calcium 8.9 - 10.3 mg/dL 9.3 9.0 9.0  Total Protein 6.5 - 8.1 g/dL 6.9 6.8 6.5  Total Bilirubin 0.3 - 1.2 mg/dL 0.3 0.3 0.3  Alkaline Phos 38 - 126 U/L 57 58 73  AST 15 - 41 U/L $Remo'19 27 16  'yPWob$ ALT 0 - 44 U/L 23 34 19    Lab Results  Component Value Date   WBC 5.2 11/10/2021   HGB 10.7 (L) 11/10/2021   HCT 33.3 (L) 11/10/2021   MCV 83.0 11/10/2021   PLT 345 11/10/2021   NEUTROABS 3.4 11/10/2021    ASSESSMENT & PLAN:  Malignant neoplasm of overlapping sites of right breast in female, estrogen receptor positive ([place]) 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  Adjuvant chemotherapy with dose dense Adriamycin and Cytoxan x4 followed by Taxol weekly x12 started  08/25/2021 2. adjuvant radiation therapy 3.  Followed by adjuvant antiestrogen therapy with complete estrogen blockade and abemaciclib ------------------------------------------------------------------------------------------------------------------------ Current treatment: Completed 4 cycles of dose dense Adriamycin and Cytoxan, today cycle 4 Taxol 08/18/2021: Echocardiogram: EF 55 to 60%   Chemo toxicities: 1.  Mild fatigue Patient could not tolerate ice treatment therefore we decided to use gloves instead.  She will bring compression stockings to the next treatment so that we can use compression as a form of prevention of neuropathy.    Skin itching: Eczema on both her thighs: Resolved  Diabetes: Appears to be under reasonable control.. Repeat prognostic panel on the lymph node in the final pathology also came back as ER/PR positive  Return to clinic Weekly for Taxol and every other week for follow-up with me   No orders of the defined types were placed in this encounter.  The patient has a good understanding of the overall plan. she agrees with it. she will call with any  problems that may develop before the next visit here.  Total time spent: 20 mins including face to face time and time spent for planning, charting and coordination of care  Rulon Eisenmenger, MD, MPH 11/10/2021  I, Thana Ates, am acting as scribe for Dr. [name].  I have reviewed the above documentation for accuracy and completeness, and I agree with the above.

## 2021-11-09 NOTE — Assessment & Plan Note (Signed)
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, margins negative, 4/7 lymph nodes, ER 75 to 95%, PR 9095%, HER2 negative, Ki-67 25% Genetics: Negative MammaPrint: Low risk (this is not a valid test for 4+ lymph nodes)  Treatment plan: 1.Adjuvant chemotherapy with dose dense Adriamycin and Cytoxan x4 followed by Taxol weekly x12started 08/25/2021 2.adjuvant radiation therapy 3.Followed by adjuvant antiestrogen therapy with complete estrogen blockade and abemaciclib ------------------------------------------------------------------------------------------------------------------------ Current treatment:Completed 4 cycles ofdose dense Adriamycin and Cytoxan, today cycle 4 Taxol 08/18/2021: Echocardiogram: EF 55 to 60%  Chemo toxicities: 1. Mild fatigue Patient could not tolerate ice treatment therefore we decided to use gloves instead.  She will bring compression stockings to the next treatment so that we can use compression as a form of prevention of neuropathy.  Skin itching:Eczema on both her thighs: Resolved  Diabetes: Appears to be under reasonable control.. Repeat prognostic panel on the lymph node in the final pathology also came back as ER/PR positive  Return to clinicWeekly for Taxol and every other week for follow-up with me

## 2021-11-10 ENCOUNTER — Inpatient Hospital Stay: Payer: 59

## 2021-11-10 ENCOUNTER — Inpatient Hospital Stay: Payer: 59 | Attending: Hematology and Oncology

## 2021-11-10 ENCOUNTER — Inpatient Hospital Stay (HOSPITAL_BASED_OUTPATIENT_CLINIC_OR_DEPARTMENT_OTHER): Payer: 59 | Admitting: Hematology and Oncology

## 2021-11-10 ENCOUNTER — Other Ambulatory Visit: Payer: Self-pay

## 2021-11-10 VITALS — HR 96

## 2021-11-10 DIAGNOSIS — R5383 Other fatigue: Secondary | ICD-10-CM | POA: Diagnosis not present

## 2021-11-10 DIAGNOSIS — Z170 Estrogen receptor positive status [ER+]: Secondary | ICD-10-CM | POA: Diagnosis not present

## 2021-11-10 DIAGNOSIS — Z95828 Presence of other vascular implants and grafts: Secondary | ICD-10-CM

## 2021-11-10 DIAGNOSIS — Z9011 Acquired absence of right breast and nipple: Secondary | ICD-10-CM | POA: Insufficient documentation

## 2021-11-10 DIAGNOSIS — Z7984 Long term (current) use of oral hypoglycemic drugs: Secondary | ICD-10-CM | POA: Diagnosis not present

## 2021-11-10 DIAGNOSIS — C50811 Malignant neoplasm of overlapping sites of right female breast: Secondary | ICD-10-CM

## 2021-11-10 DIAGNOSIS — Z5111 Encounter for antineoplastic chemotherapy: Secondary | ICD-10-CM | POA: Diagnosis present

## 2021-11-10 DIAGNOSIS — E119 Type 2 diabetes mellitus without complications: Secondary | ICD-10-CM | POA: Diagnosis not present

## 2021-11-10 LAB — CMP (CANCER CENTER ONLY)
ALT: 24 U/L (ref 0–44)
AST: 16 U/L (ref 15–41)
Albumin: 4 g/dL (ref 3.5–5.0)
Alkaline Phosphatase: 59 U/L (ref 38–126)
Anion gap: 11 (ref 5–15)
BUN: 10 mg/dL (ref 6–20)
CO2: 23 mmol/L (ref 22–32)
Calcium: 9.4 mg/dL (ref 8.9–10.3)
Chloride: 105 mmol/L (ref 98–111)
Creatinine: 0.68 mg/dL (ref 0.44–1.00)
GFR, Estimated: >60 mL/min (ref >60)
Glucose, Bld: 213 mg/dL — ABNORMAL HIGH (ref 70–99)
Potassium: 3.9 mmol/L (ref 3.5–5.1)
Sodium: 139 mmol/L (ref 135–145)
Total Bilirubin: 0.5 mg/dL (ref 0.3–1.2)
Total Protein: 6.7 g/dL (ref 6.5–8.1)

## 2021-11-10 LAB — CBC WITH DIFFERENTIAL (CANCER CENTER ONLY)
Abs Immature Granulocytes: 0.04 10*3/uL (ref 0.00–0.07)
Basophils Absolute: 0.1 10*3/uL (ref 0.0–0.1)
Basophils Relative: 1 %
Eosinophils Absolute: 0.2 10*3/uL (ref 0.0–0.5)
Eosinophils Relative: 3 %
HCT: 33.3 % — ABNORMAL LOW (ref 36.0–46.0)
Hemoglobin: 10.7 g/dL — ABNORMAL LOW (ref 12.0–15.0)
Immature Granulocytes: 1 %
Lymphocytes Relative: 26 %
Lymphs Abs: 1.3 10*3/uL (ref 0.7–4.0)
MCH: 26.7 pg (ref 26.0–34.0)
MCHC: 32.1 g/dL (ref 30.0–36.0)
MCV: 83 fL (ref 80.0–100.0)
Monocytes Absolute: 0.3 10*3/uL (ref 0.1–1.0)
Monocytes Relative: 5 %
Neutro Abs: 3.4 10*3/uL (ref 1.7–7.7)
Neutrophils Relative %: 64 %
Platelet Count: 345 10*3/uL (ref 150–400)
RBC: 4.01 MIL/uL (ref 3.87–5.11)
RDW: 18.8 % — ABNORMAL HIGH (ref 11.5–15.5)
WBC Count: 5.2 10*3/uL (ref 4.0–10.5)
nRBC: 0 % (ref 0.0–0.2)

## 2021-11-10 MED ORDER — DEXAMETHASONE SODIUM PHOSPHATE 10 MG/ML IJ SOLN
4.0000 mg | Freq: Once | INTRAMUSCULAR | Status: AC
  Administered 2021-11-10: 4 mg via INTRAVENOUS
  Filled 2021-11-10: qty 1

## 2021-11-10 MED ORDER — DIPHENHYDRAMINE HCL 50 MG/ML IJ SOLN
25.0000 mg | Freq: Once | INTRAMUSCULAR | Status: AC
  Administered 2021-11-10: 25 mg via INTRAVENOUS
  Filled 2021-11-10: qty 1

## 2021-11-10 MED ORDER — HEPARIN SOD (PORK) LOCK FLUSH 100 UNIT/ML IV SOLN
500.0000 [IU] | Freq: Once | INTRAVENOUS | Status: AC | PRN
  Administered 2021-11-10: 500 [IU]

## 2021-11-10 MED ORDER — SODIUM CHLORIDE 0.9 % IV SOLN: Freq: Once | INTRAVENOUS | Status: AC

## 2021-11-10 MED ORDER — SODIUM CHLORIDE 0.9 % IV SOLN
80.0000 mg/m2 | Freq: Once | INTRAVENOUS | Status: AC
  Administered 2021-11-10: 144 mg via INTRAVENOUS
  Filled 2021-11-10: qty 24

## 2021-11-10 MED ORDER — SODIUM CHLORIDE 0.9% FLUSH
10.0000 mL | INTRAVENOUS | Status: DC | PRN
  Administered 2021-11-10: 10 mL

## 2021-11-10 MED ORDER — FAMOTIDINE 20 MG IN NS 100 ML IVPB
20.0000 mg | Freq: Once | INTRAVENOUS | Status: AC
  Administered 2021-11-10: 20 mg via INTRAVENOUS
  Filled 2021-11-10: qty 100

## 2021-11-10 MED ORDER — SODIUM CHLORIDE 0.9% FLUSH: 10.0000 mL | Freq: Once | INTRAVENOUS | Status: DC

## 2021-11-10 NOTE — Patient Instructions (Signed)
[facility]  Discharge Instructions: Thank you for choosing [facility] to provide your oncology and hematology care.   If you have a lab appointment with the [facility], please go directly to the [facility] and check in at the registration area.   Wear comfortable clothing and clothing appropriate for easy access to any Portacath or PICC line.   We strive to give you quality time with your provider. You may need to reschedule your appointment if you arrive late (15 or more minutes).  Arriving late affects you and other patients whose appointments are after yours.  Also, if you miss three or more appointments without notifying the office, you may be dismissed from the clinic at the provider's discretion.      For prescription refill requests, have your pharmacy contact our office and allow 72 hours for refills to be completed.    Today you received the following chemotherapy and/or immunotherapy agents Taxol      To help prevent nausea and vomiting after your treatment, we encourage you to take your nausea medication as directed.  BELOW ARE SYMPTOMS THAT SHOULD BE REPORTED IMMEDIATELY: *FEVER GREATER THAN 100.4 F (38 C) OR HIGHER *CHILLS OR SWEATING *NAUSEA AND VOMITING THAT IS NOT CONTROLLED WITH YOUR NAUSEA MEDICATION *UNUSUAL SHORTNESS OF BREATH *UNUSUAL BRUISING OR BLEEDING *URINARY PROBLEMS (pain or burning when urinating, or frequent urination) *BOWEL PROBLEMS (unusual diarrhea, constipation, pain near the anus) TENDERNESS IN MOUTH AND THROAT WITH OR WITHOUT PRESENCE OF ULCERS (sore throat, sores in mouth, or a toothache) UNUSUAL RASH, SWELLING OR PAIN  UNUSUAL VAGINAL DISCHARGE OR ITCHING   Items with * indicate a potential emergency and should be followed up as soon as possible or go to the Emergency Department if any problems should occur.  Please show the CHEMOTHERAPY ALERT CARD or IMMUNOTHERAPY ALERT CARD at check-in to the  Emergency Department and triage nurse.  Should you have questions after your visit or need to cancel or reschedule your appointment, please contact [facility]  Dept: [phone]  and follow the prompts.  Office hours are 8:00 a.m. to 4:30 p.m. Monday - Friday. Please note that voicemails left after 4:00 p.m. may not be returned until the following business day.  We are closed weekends and major holidays. You have access to a nurse at all times for urgent questions. Please call the main number to the clinic Dept: [phone] and follow the prompts.   For any non-urgent questions, you may also contact your provider using MyChart. We now offer e-Visits for anyone 47 and older to request care online for non-urgent symptoms. For details visit [email or web address].   Also download the MyChart app! Go to the app store, search "MyChart", open the app, select [facility], and log in with your MyChart username and password.  Due to Covid, a mask is required upon entering the hospital/clinic. If you do not have a mask, one will be given to you upon arrival. For doctor visits, patients may have 1 support person aged 91 or older with them. For treatment visits, patients cannot have anyone with them due to current Covid guidelines and our immunocompromised population.

## 2021-11-17 ENCOUNTER — Encounter: Payer: Self-pay | Admitting: *Deleted

## 2021-11-17 ENCOUNTER — Other Ambulatory Visit: Payer: Self-pay

## 2021-11-17 ENCOUNTER — Inpatient Hospital Stay: Payer: 59

## 2021-11-17 VITALS — BP 104/71 | HR 97 | Temp 98.7°F | Resp 18 | Wt 151.1 lb

## 2021-11-17 DIAGNOSIS — C50811 Malignant neoplasm of overlapping sites of right female breast: Secondary | ICD-10-CM | POA: Diagnosis not present

## 2021-11-17 DIAGNOSIS — Z95828 Presence of other vascular implants and grafts: Secondary | ICD-10-CM

## 2021-11-17 DIAGNOSIS — Z170 Estrogen receptor positive status [ER+]: Secondary | ICD-10-CM

## 2021-11-17 LAB — CBC WITH DIFFERENTIAL (CANCER CENTER ONLY)
Abs Immature Granulocytes: 0.02 10*3/uL (ref 0.00–0.07)
Basophils Absolute: 0 10*3/uL (ref 0.0–0.1)
Basophils Relative: 1 %
Eosinophils Absolute: 0.2 10*3/uL (ref 0.0–0.5)
Eosinophils Relative: 4 %
HCT: 31.4 % — ABNORMAL LOW (ref 36.0–46.0)
Hemoglobin: 10.3 g/dL — ABNORMAL LOW (ref 12.0–15.0)
Immature Granulocytes: 1 %
Lymphocytes Relative: 23 %
Lymphs Abs: 1 10*3/uL (ref 0.7–4.0)
MCH: 27.2 pg (ref 26.0–34.0)
MCHC: 32.8 g/dL (ref 30.0–36.0)
MCV: 83.1 fL (ref 80.0–100.0)
Monocytes Absolute: 0.3 10*3/uL (ref 0.1–1.0)
Monocytes Relative: 6 %
Neutro Abs: 2.8 10*3/uL (ref 1.7–7.7)
Neutrophils Relative %: 65 %
Platelet Count: 274 10*3/uL (ref 150–400)
RBC: 3.78 MIL/uL — ABNORMAL LOW (ref 3.87–5.11)
RDW: 18.5 % — ABNORMAL HIGH (ref 11.5–15.5)
WBC Count: 4.4 10*3/uL (ref 4.0–10.5)
nRBC: 0 % (ref 0.0–0.2)

## 2021-11-17 LAB — CMP (CANCER CENTER ONLY)
ALT: 23 U/L (ref 0–44)
AST: 16 U/L (ref 15–41)
Albumin: 3.8 g/dL (ref 3.5–5.0)
Alkaline Phosphatase: 57 U/L (ref 38–126)
Anion gap: 11 (ref 5–15)
BUN: 8 mg/dL (ref 6–20)
CO2: 22 mmol/L (ref 22–32)
Calcium: 8.9 mg/dL (ref 8.9–10.3)
Chloride: 105 mmol/L (ref 98–111)
Creatinine: 0.68 mg/dL (ref 0.44–1.00)
GFR, Estimated: >60 mL/min (ref >60)
Glucose, Bld: 263 mg/dL — ABNORMAL HIGH (ref 70–99)
Potassium: 3.9 mmol/L (ref 3.5–5.1)
Sodium: 138 mmol/L (ref 135–145)
Total Bilirubin: 0.5 mg/dL (ref 0.3–1.2)
Total Protein: 6.5 g/dL (ref 6.5–8.1)

## 2021-11-17 MED ORDER — SODIUM CHLORIDE 0.9% FLUSH
10.0000 mL | INTRAVENOUS | Status: DC | PRN
  Administered 2021-11-17: 10 mL

## 2021-11-17 MED ORDER — DEXAMETHASONE SODIUM PHOSPHATE 10 MG/ML IJ SOLN
4.0000 mg | Freq: Once | INTRAMUSCULAR | Status: AC
  Administered 2021-11-17: 4 mg via INTRAVENOUS
  Filled 2021-11-17: qty 1

## 2021-11-17 MED ORDER — SODIUM CHLORIDE 0.9% FLUSH
10.0000 mL | Freq: Once | INTRAVENOUS | Status: AC
  Administered 2021-11-17: 10 mL

## 2021-11-17 MED ORDER — SODIUM CHLORIDE 0.9 % IV SOLN
80.0000 mg/m2 | Freq: Once | INTRAVENOUS | Status: AC
  Administered 2021-11-17: 144 mg via INTRAVENOUS
  Filled 2021-11-17: qty 24

## 2021-11-17 MED ORDER — DIPHENHYDRAMINE HCL 50 MG/ML IJ SOLN
25.0000 mg | Freq: Once | INTRAMUSCULAR | Status: AC
  Administered 2021-11-17: 25 mg via INTRAVENOUS
  Filled 2021-11-17: qty 1

## 2021-11-17 MED ORDER — FAMOTIDINE 20 MG IN NS 100 ML IVPB
20.0000 mg | Freq: Once | INTRAVENOUS | Status: AC
  Administered 2021-11-17: 20 mg via INTRAVENOUS
  Filled 2021-11-17: qty 100

## 2021-11-17 MED ORDER — SODIUM CHLORIDE 0.9 % IV SOLN: Freq: Once | INTRAVENOUS | Status: AC

## 2021-11-17 MED ORDER — HEPARIN SOD (PORK) LOCK FLUSH 100 UNIT/ML IV SOLN
500.0000 [IU] | Freq: Once | INTRAVENOUS | Status: AC | PRN
  Administered 2021-11-17: 500 [IU]

## 2021-11-17 NOTE — Progress Notes (Signed)
Pump channel error, pump settings entered manually and double verified by Shonna Chock, RN.

## 2021-11-20 ENCOUNTER — Ambulatory Visit: Payer: 59 | Attending: Plastic Surgery

## 2021-11-20 ENCOUNTER — Other Ambulatory Visit: Payer: Self-pay

## 2021-11-20 DIAGNOSIS — C50811 Malignant neoplasm of overlapping sites of right female breast: Secondary | ICD-10-CM | POA: Insufficient documentation

## 2021-11-20 DIAGNOSIS — Z170 Estrogen receptor positive status [ER+]: Secondary | ICD-10-CM | POA: Diagnosis present

## 2021-11-20 DIAGNOSIS — R293 Abnormal posture: Secondary | ICD-10-CM | POA: Insufficient documentation

## 2021-11-20 DIAGNOSIS — M25611 Stiffness of right shoulder, not elsewhere classified: Secondary | ICD-10-CM | POA: Diagnosis present

## 2021-11-20 NOTE — Therapy (Signed)
[facility] @ [address] [place], [place], [address] Phone: [phone]   Fax:  [phone]  Physical Therapy Treatment  Patient Details  Name: Allison Reed MRN: [id number] Date of Birth: Nov 12, 1976 Referring Provider (PT): Dr. Donne Hazel   Encounter Date: 11/20/2021   PT End of Session - 11/20/21 1700     Visit Number 9    Number of Visits 21    Date for PT Re-Evaluation 01/01/22    PT Start Time 1602    PT Stop Time 8756    PT Time Calculation (min) 52 min    Activity Tolerance Patient tolerated treatment well    Behavior During Therapy [facility] for tasks assessed/performed             Past Medical History:  Diagnosis Date   Diabetes mellitus without complication ([place])    Type 2   Family history of brain tumor    Family history of skin cancer    Gestational diabetes    Headache    otc med prn   Heartburn in pregnancy    Incompetent cervix in pregnancy 04/2015   Missed abortion    no surgery required   Postpartum care following cesarean delivery (11/2) 10/12/2015   right breast ca 06/2021   UTI in pregnancy 03/2015   recent UTI, treatment completed    Past Surgical History:  Procedure Laterality Date   APPENDECTOMY  2006   BREAST RECONSTRUCTION WITH PLACEMENT OF TISSUE EXPANDER AND ALLODERM Right 07/25/2021   Procedure: RIGHT BREAST RECONSTRUCTION WITH PLACEMENT OF TISSUE EXPANDER AND ALLODERM;  Surgeon: Irene Limbo, MD;  Location: [facility];  Service: Plastics;  Laterality: Right;   CERCLAGE LAPAROSCOPIC ABDOMINAL N/A 04/15/2015   Procedure: LAPAROSCOPIC TRANSABDOMINAL CERVCOISTHMIC CERCLAGE;  Surgeon: Governor Specking, MD;  Location: [facility];  Service: Gynecology;  Laterality: N/A;   CERVICAL CERCLAGE  02/2011   vaginal   CESAREAN SECTION N/A 10/12/2015   Procedure: CESAREAN SECTION;  Surgeon: Princess Bruins, MD;  Location: [facility];  Service: Obstetrics;  Laterality: N/A;   CESAREAN  SECTION N/A 03/03/2018   Procedure: Repeat CESAREAN SECTION/Removal of Abdominal Cerclage;  Surgeon: Azucena Fallen, MD;  Location: [facility];  Service: Obstetrics;  Laterality: N/A;  EDD: 03/28/18   MASTECTOMY W/ SENTINEL NODE BIOPSY Right 07/25/2021   Procedure: RIGHT MASTECTOMY WITH AXILLARY SENTINEL LYMPH NODE BIOPSY;  Surgeon: [name], MD;  Location: [facility];  Service: General;  Laterality: Right;   PORTACATH PLACEMENT N/A 08/24/2021   Procedure: INSERTION PORT-A-CATH;  Surgeon: [name], MD;  Location: [facility];  Service: General;  Laterality: N/A;   RADIOACTIVE SEED GUIDED AXILLARY SENTINEL LYMPH NODE Right 07/25/2021   Procedure: RADIOACTIVE SEED GUIDED RIGHT AXILLARY NODE EXCISION;  Surgeon: [name], MD;  Location: [facility];  Service: General;  Laterality: Right;   [place] EXTRACTION  2001    There were no vitals filed for this visit.   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 The cording on the right started back last Friday and  has been the most bothersome.  I can feel it alot with reaching. Feel tightness at lateral trunk as well. There is no breast swelling, but sometimes my right breast feels warmer.    Pertinent History She is a wife of a pulmonary  critical care physician . She noted a palpable right breast mass. This is followed by a mammogram with a mass with calcifications as really throughout the upper outer quadrant and in the central breast. This measures 9.4 x 8.7 x 6.7 cm. On ultrasound she had 3 separate masses at 10, 11, and 12:00. The sizes are 1.7, 2.1, and 2.2 cm. There is also a single abnormal axillary lymph node that has undergone biopsy. 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 She is s/p right breast mastectomy with SLNB 0/7 LN on August 16 andl also had tissue expanders in place.  She is currently undergoing chemotherapy for 6 months every other week and radiation will follow    Patient Stated Goals Reassess after surgery    Currently in Pain? Yes    Pain Score 5    with movement   Pain Location Axilla    Pain Orientation Right    Pain Descriptors / Indicators Aching    Pain Type Acute pain    Pain Onset In the past 7 days    Pain Frequency Intermittent    Aggravating Factors  reaching    Pain Relieving Factors not reaching    Multiple Pain Sites No                [facility] PT Assessment - 11/20/21 0001       Assessment   Medical Diagnosis Right breast Cancer    Referring Provider (PT) Dr. Donne Hazel    Hand Dominance Right    Prior Therapy yes      Precautions   Precaution Comments lymphedema risk      Cognition   Overall Cognitive Status Within Functional Limits for tasks assessed      Observation/Other Assessments   Observations incisions well healed.  no visible swelling.  Moderate cording evident axilla to elbow with tenderness    Skin Integrity portacath present on right side      AROM   Right Shoulder Flexion 134 Degrees    Right Shoulder ABduction 132 Degrees    Right Shoulder External Rotation 92 Degrees               LYMPHEDEMA/ONCOLOGY QUESTIONNAIRE - 11/20/21 0001       Type   Cancer Type Invasive Ductal Carcinoma      Surgeries   Mastectomy Date 07/25/21    Number Lymph Nodes Removed 7   4 with cancer     Right Upper Extremity Lymphedema   10 cm Proximal to Olecranon Process 24.5 cm    Olecranon Process 24.5 cm    10 cm Proximal to Ulnar Styloid Process 20.4 cm    Just Proximal to Ulnar  Styloid Process 14.7 cm    At Base of 2nd Digit 6 cm      Left Upper Extremity Lymphedema   At [facility] of 2nd Digit 6 cm                        [facility] Adult PT Treatment/Exercise - 11/20/21 0001       Manual Therapy   Manual Therapy Soft tissue mobilization;Myofascial release;Passive ROM    Manual therapy  comments pt gave permision for demographics to be faxed to [facility] . Small TG soft cut for right UE   Soft tissue mobilization Right upper arm to axilla over area of cording with cocoa butter (after MFR)    Myofascial Release MFR to right axilla and medial arm cording with longitudinal stretch and S technique    Passive ROM Into Rt shoulder flexion, abduction and D2 flexion.                          PT Long Term Goals - 11/20/21 1618       PT LONG TERM GOAL #1   Title Pt will achieve full right shoulder ROM post surgery for full return to daily activities    Time 6    Period Weeks    Status On-going    Target Date 01/01/22      PT LONG TERM GOAL #2   Title Pt will improve right shoulder flexion and scaption by atleast 15 degrees    Baseline regressed today secondary to cording    Time 6    Period Weeks    Status On-going    Target Date 01/01/22      PT LONG TERM GOAL #3   Title Pt will report decrease achiness and pulling from cording by 50% or more    Time 6    Period Weeks    Status On-going    Target Date 01/01/22      PT LONG TERM GOAL #4   Title Pt will be able to perform arm movements to continue with her indian dancing    Baseline was able to perform but too fatigued from chemo. now cording limits    Time 6    Period Weeks    Status On-going    Target Date 01/01/22                   Plan - 11/20/21 1701     Clinical Impression Statement Pt comes in today reporting new complaints of cording in the right axilla, and upper arm which is now limiting AROM for reaching.  AROM was reassessed today and has declined since last  measurement.  Circumference measures of the right arm were taken with minimal difference noted.  All of pts goals were assessed and all are ongoing secondary to new limitations in ROM due to cording.  Performed MFR techniques to cording with 1 large pop and 1 smaller pop noted, and PROM to right shoulder, as well as soft tissue mobilization to right UE to decrease tenderness. Cut a small TG soft to see if this would benefit pts. cording.    Personal Factors and Comorbidities Comorbidity 1    Comorbidities right breast cancer s/p mastectomy  presently having chemo and pending and radiation    Stability/Clinical Decision Making Stable/Uncomplicated    Rehab Potential Excellent    PT Frequency 2x / week    PT Duration 6 weeks    PT Treatment/Interventions ADLs/Self Care Home Management;Therapeutic exercise;Manual techniques;Patient/family education;Manual lymph drainage;Scar mobilization;Passive range of motion;Moist Heat    PT Next Visit Plan MFR for new cording, PROM, STM to tight areas of upper quadrant prn, Strength ABC    PT Home Exercise Plan 4 post op exs , supine wand flexion and scaption; modified downward dog on wall, supine scapular series    Consulted and Agree with Plan of Care Patient  Patient will benefit from skilled therapeutic intervention in order to improve the following deficits and impairments:  Postural dysfunction, Decreased knowledge of precautions, Decreased scar mobility, Increased fascial restricitons, Decreased range of motion  Visit Diagnosis: Malignant neoplasm of overlapping sites of right breast in female, estrogen receptor positive ([place])  Abnormal posture  Stiffness of right shoulder, not elsewhere classified     Problem List Patient Active Problem List   Diagnosis Date Noted   Port-A-Cath in place 08/25/2021   S/P mastectomy, right 07/25/2021   Genetic testing 07/01/2021   Cancer ([place]) 06/26/2021   Family history of skin cancer  06/22/2021   Family history of brain tumor 06/22/2021   Malignant neoplasm of overlapping sites of right breast in female, estrogen receptor positive ([place]) 06/21/2021   Premature uterine contractions causing threatened premature labor in third trimester 03/03/2018   Previous cesarean delivery, delivered 03/03/2018   Postpartum care following cesarean delivery (3/25) 03/03/2018   Status post repeat low transverse cesarean section 03/03/2018   DM (diabetes mellitus) in pregnancy 03/02/2018   Incompetent cervix in pregnancy 03/02/2018   Premature uterine contractions in third trimester, antepartum 03/02/2018   Active labor 10/12/2015    Claris Pong, PT 11/20/2021, 5:16 PM  [facility] @ [address], [place], [address] Phone: [phone]   Fax:  [phone]  Name: BRITTNEI JAGIELLO MRN: [id number] Date of Birth: 04-Jan-1976

## 2021-11-23 NOTE — Progress Notes (Signed)
Patient Care Team: Adrian Prince, MD as PCP - General (Endocrinology) Pershing Proud, RN as Oncology Nurse Navigator Donnelly Angelica, RN as Oncology Nurse Navigator  DIAGNOSIS:    ICD-10-CM   1. Malignant neoplasm of overlapping sites of right breast in female, estrogen receptor positive (HCC)  C50.811    Z17.0       SUMMARY OF ONCOLOGIC HISTORY: Oncology History Overview Note  Ductal carcinoma in situ of the right breast  She palpated a lump in her right breast. Diagnostic mammogram and [place] on 06/08/21 showed irregular hypoechoic lesions in the right breast with a single abnormal right axillary lymph node without normal cortex. Biopsy on 06/16/21 showed high grade DCIS with calcifications and necrosis.   Malignant neoplasm of overlapping sites of right breast in female, estrogen receptor positive (HCC)  06/16/2021 Initial Diagnosis   Palpable right breast mass: Breast MRI revealed extensive involvement of the right breast with mass and non-mass enhancement superior right breast mass measures 4.9 x 1.2 cm and together with non-mass enhancement measured 8.8 cm, enhancing mass LOQ 1.3 cm, left breast indeterminate 0.9 cm mass and a 0.8 cm mass UOQ, single abnormal lymph node Biopsy 10:00, 11:00 and 12:00: IDC with DCIS, biopsy right axillary lymph node: IDC, ER 75 to 95%, PR 90 to 95%, Ki-67 25%, HER2 negative on 1 biopsy and 2+ by IHC and FISH pending   06/21/2021 Cancer Staging   Staging form: Breast, AJCC 8th Edition - Clinical stage from 06/21/2021: Stage IIA (cT3, cN1, cM0, G2, ER+, PR+, HER2-) - Signed by [name], MD on 06/21/2021 Histologic grading system: 3 grade system    07/01/2021 Genetic Testing   Negative genetic testing:  No pathogenic variants detected on the [facility] BRCAplus panel (report date 07/01/2021) or the [employer] CancerNext-Expanded + RNAinsight panel (report date 07/01/2021).   The BRCAplus panel offered by [employer] and includes sequencing and  deletion/duplication analysis for the following 8 genes: ATM, BRCA1, BRCA2, CDH1, CHEK2, PALB2, PTEN, and TP53. The CancerNext-Expanded + RNAinsight gene panel offered by [employer] and includes sequencing and rearrangement analysis for the following 77 genes: AIP, ALK, APC, ATM, AXIN2, BAP1, BARD1, BLM, BMPR1A, BRCA1, BRCA2, BRIP1, CDC73, CDH1, CDK4, CDKN1B, CDKN2A, CHEK2, CTNNA1, DICER1, FANCC, FH, FLCN, GALNT12, KIF1B, LZTR1, MAX, MEN1, MET, MLH1, MSH2, MSH3, MSH6, MUTYH, NBN, NF1, NF2, NTHL1, PALB2, PHOX2B, PMS2, POT1, PRKAR1A, PTCH1, PTEN, RAD51C, RAD51D, RB1, RECQL, RET, SDHA, SDHAF2, SDHB, SDHC, SDHD, SMAD4, SMARCA4, SMARCB1, SMARCE1, STK11, SUFU, TMEM127, TP53, TSC1, TSC2, VHL and XRCC2 (sequencing and deletion/duplication); EGFR, EGLN1, HOXB13, KIT, MITF, PDGFRA, POLD1 and POLE (sequencing only); EPCAM and GREM1 (deletion/duplication only). RNA data is routinely analyzed for use in variant interpretation for all genes.   07/25/2021 Surgery   Right mastectomy: Foci of invasive ductal carcinoma measuring 1.1 cm grade 2 with extensive DCIS spanning 8.9 cm, margins negative, 4/7 lymph nodes, ER 75 to 95%, PR 9095%, HER2 negative, Ki-67 25%   08/25/2021 -  Chemotherapy   Patient is on Treatment Plan : BREAST ADJUVANT DOSE DENSE AC q14d / PACLitaxel q7d       CHIEF COMPLIANT: Cycle 6 Taxol  INTERVAL HISTORY: Allison Reed is a 45 y.o. with above-mentioned history of  right mastectomy, currently on chemotherapy with Taxol. She presents to the clinic today for treatment.   ALLERGIES:  is allergic to betadine [povidone iodine] and tape.  MEDICATIONS:  Current Outpatient Medications  Medication Sig Dispense Refill   Empagliflozin-metFORMIN HCl ER (SYNJARDY XR) 09-999 MG  TB24 Take by mouth.     lidocaine-prilocaine (EMLA) cream Apply to affected area once 30 g 3   sitaGLIPtin (JANUVIA) 100 MG tablet Take 100 mg by mouth daily.     No current facility-administered medications for this visit.     PHYSICAL EXAMINATION: ECOG PERFORMANCE STATUS: 1 - Symptomatic but completely ambulatory  Vitals:   11/24/21 0834  BP: 114/68  Pulse: 97  Resp: 16  Temp: (!) 97.5 F (36.4 C)  SpO2: 100%   Filed Weights   11/24/21 0834  Weight: 152 lb 12.8 oz (69.3 kg)    LABORATORY DATA:  I have reviewed the data as listed CMP Latest Ref Rng & Units 11/17/2021 11/10/2021 11/03/2021  Glucose 70 - 99 mg/dL 263(H) 213(H) 164(H)  BUN 6 - 20 mg/dL $Remove'8 10 10  'uZBxkad$ Creatinine 0.44 - 1.00 mg/dL 0.68 0.68 0.61  Sodium 135 - 145 mmol/L 138 139 139  Potassium 3.5 - 5.1 mmol/L 3.9 3.9 3.6  Chloride 98 - 111 mmol/L 105 105 105  CO2 22 - 32 mmol/L $RemoveB'22 23 25  'qVUuMjIh$ Calcium 8.9 - 10.3 mg/dL 8.9 9.4 9.3  Total Protein 6.5 - 8.1 g/dL 6.5 6.7 6.9  Total Bilirubin 0.3 - 1.2 mg/dL 0.5 0.5 0.3  Alkaline Phos 38 - 126 U/L 57 59 57  AST 15 - 41 U/L $Remo'16 16 19  'bAYwQ$ ALT 0 - 44 U/L $Remo'23 24 23    'MAclU$ Lab Results  Component Value Date   WBC 3.9 (L) 11/24/2021   HGB 10.9 (L) 11/24/2021   HCT 32.8 (L) 11/24/2021   MCV 81.8 11/24/2021   PLT 296 11/24/2021   NEUTROABS 2.4 11/24/2021    ASSESSMENT & PLAN:  Malignant neoplasm of overlapping sites of right breast in female, estrogen receptor positive ([place]) 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  Adjuvant chemotherapy with dose dense Adriamycin and Cytoxan x4 followed by Taxol weekly x12 started 08/25/2021 2. adjuvant radiation therapy 3.  Followed by adjuvant antiestrogen therapy with complete estrogen blockade and abemaciclib ------------------------------------------------------------------------------------------------------------------------ Current treatment: Completed 4 cycles of dose dense Adriamycin and  Cytoxan, today cycle 6 Taxol 08/18/2021: Echocardiogram: EF 55 to 60%   Chemo toxicities: 1.  Mild fatigue She denies any nausea or vomiting. She is using compression stockings as a form of prevention of neuropathy.    Skin itching: Eczema on both her thighs: Resolved  Diabetes: Appears to be under reasonable control.. Repeat prognostic panel on the lymph node in the final pathology also came back as ER/PR positive   She wants to be off chemo for Christmas.  She will receive her next treatment on 12/08/2021 Return to clinic on 12/08/2021 for follow-up    No orders of the defined types were placed in this encounter.  The patient has a good understanding of the overall plan. she agrees with it. she will call with any problems that may develop before the next visit here.  Total time spent: 30 mins including face to face time and time spent for planning, charting and coordination of care  Rulon Eisenmenger, MD, MPH 11/24/2021  I, Thana Ates, am acting as scribe for Dr. [name].  I have reviewed the above documentation for accuracy and completeness, and I agree with  the above.

## 2021-11-23 NOTE — Assessment & Plan Note (Signed)
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, margins negative, 4/7 lymph nodes, ER 75 to 95%, PR 9095%, HER2 negative, Ki-67 25% Genetics: Negative MammaPrint: Low risk (this is not a valid test for 4+ lymph nodes)  Treatment plan: 1.Adjuvant chemotherapy with dose dense Adriamycin and Cytoxan x4 followed by Taxol weekly x12started 08/25/2021 2.adjuvant radiation therapy 3.Followed by adjuvant antiestrogen therapy with complete estrogen blockade and abemaciclib ------------------------------------------------------------------------------------------------------------------------ Current treatment:Completed 4 cycles ofdose dense Adriamycin and Cytoxan, today cycle6Taxol 08/18/2021: Echocardiogram: EF 55 to 60%  Chemo toxicities: 1.Mild fatigue Patient could not tolerate ice treatment therefore we decided to use gloves instead. She will bring compression stockings to the next treatment so that we can use compression as a form of prevention of neuropathy.  Skin itching:Eczema on both her thighs: Resolved Diabetes: Appears to be under reasonable control.. Repeat prognostic panel on the lymph node in the final pathology also came back as ER/PR positive  Return to clinicWeekly for Taxol and every other week for follow-up with me

## 2021-11-24 ENCOUNTER — Inpatient Hospital Stay: Payer: 59

## 2021-11-24 ENCOUNTER — Other Ambulatory Visit: Payer: Self-pay

## 2021-11-24 ENCOUNTER — Inpatient Hospital Stay (HOSPITAL_BASED_OUTPATIENT_CLINIC_OR_DEPARTMENT_OTHER): Payer: 59 | Admitting: Hematology and Oncology

## 2021-11-24 ENCOUNTER — Inpatient Hospital Stay: Payer: 59 | Admitting: Adult Health

## 2021-11-24 DIAGNOSIS — C50811 Malignant neoplasm of overlapping sites of right female breast: Secondary | ICD-10-CM

## 2021-11-24 DIAGNOSIS — Z95828 Presence of other vascular implants and grafts: Secondary | ICD-10-CM

## 2021-11-24 DIAGNOSIS — Z170 Estrogen receptor positive status [ER+]: Secondary | ICD-10-CM | POA: Diagnosis not present

## 2021-11-24 LAB — CMP (CANCER CENTER ONLY)
ALT: 27 U/L (ref 0–44)
AST: 18 U/L (ref 15–41)
Albumin: 4 g/dL (ref 3.5–5.0)
Alkaline Phosphatase: 59 U/L (ref 38–126)
Anion gap: 12 (ref 5–15)
BUN: 9 mg/dL (ref 6–20)
CO2: 22 mmol/L (ref 22–32)
Calcium: 9.3 mg/dL (ref 8.9–10.3)
Chloride: 102 mmol/L (ref 98–111)
Creatinine: 0.71 mg/dL (ref 0.44–1.00)
GFR, Estimated: >60 mL/min (ref >60)
Glucose, Bld: 296 mg/dL — ABNORMAL HIGH (ref 70–99)
Potassium: 3.9 mmol/L (ref 3.5–5.1)
Sodium: 136 mmol/L (ref 135–145)
Total Bilirubin: 0.5 mg/dL (ref 0.3–1.2)
Total Protein: 6.7 g/dL (ref 6.5–8.1)

## 2021-11-24 LAB — CBC WITH DIFFERENTIAL (CANCER CENTER ONLY)
Abs Immature Granulocytes: 0.01 10*3/uL (ref 0.00–0.07)
Basophils Absolute: 0 10*3/uL (ref 0.0–0.1)
Basophils Relative: 1 %
Eosinophils Absolute: 0.2 10*3/uL (ref 0.0–0.5)
Eosinophils Relative: 4 %
HCT: 32.8 % — ABNORMAL LOW (ref 36.0–46.0)
Hemoglobin: 10.9 g/dL — ABNORMAL LOW (ref 12.0–15.0)
Immature Granulocytes: 0 %
Lymphocytes Relative: 26 %
Lymphs Abs: 1 10*3/uL (ref 0.7–4.0)
MCH: 27.2 pg (ref 26.0–34.0)
MCHC: 33.2 g/dL (ref 30.0–36.0)
MCV: 81.8 fL (ref 80.0–100.0)
Monocytes Absolute: 0.3 10*3/uL (ref 0.1–1.0)
Monocytes Relative: 7 %
Neutro Abs: 2.4 10*3/uL (ref 1.7–7.7)
Neutrophils Relative %: 62 %
Platelet Count: 296 10*3/uL (ref 150–400)
RBC: 4.01 MIL/uL (ref 3.87–5.11)
RDW: 17.4 % — ABNORMAL HIGH (ref 11.5–15.5)
WBC Count: 3.9 10*3/uL — ABNORMAL LOW (ref 4.0–10.5)
nRBC: 0 % (ref 0.0–0.2)

## 2021-11-24 MED ORDER — SODIUM CHLORIDE 0.9% FLUSH
10.0000 mL | Freq: Once | INTRAVENOUS | Status: AC
  Administered 2021-11-24: 10 mL

## 2021-11-24 MED ORDER — SODIUM CHLORIDE 0.9% FLUSH
10.0000 mL | INTRAVENOUS | Status: DC | PRN
  Administered 2021-11-24: 10 mL

## 2021-11-24 MED ORDER — SODIUM CHLORIDE 0.9 % IV SOLN: Freq: Once | INTRAVENOUS | Status: AC

## 2021-11-24 MED ORDER — DIPHENHYDRAMINE HCL 50 MG/ML IJ SOLN
25.0000 mg | Freq: Once | INTRAMUSCULAR | Status: AC
  Administered 2021-11-24: 25 mg via INTRAVENOUS
  Filled 2021-11-24: qty 1

## 2021-11-24 MED ORDER — DEXAMETHASONE SODIUM PHOSPHATE 10 MG/ML IJ SOLN
4.0000 mg | Freq: Once | INTRAMUSCULAR | Status: AC
  Administered 2021-11-24: 4 mg via INTRAVENOUS
  Filled 2021-11-24: qty 1

## 2021-11-24 MED ORDER — HEPARIN SOD (PORK) LOCK FLUSH 100 UNIT/ML IV SOLN
500.0000 [IU] | Freq: Once | INTRAVENOUS | Status: AC | PRN
  Administered 2021-11-24: 500 [IU]

## 2021-11-24 MED ORDER — SODIUM CHLORIDE 0.9 % IV SOLN
80.0000 mg/m2 | Freq: Once | INTRAVENOUS | Status: AC
  Administered 2021-11-24: 144 mg via INTRAVENOUS
  Filled 2021-11-24: qty 24

## 2021-11-24 MED ORDER — FAMOTIDINE 20 MG IN NS 100 ML IVPB
20.0000 mg | Freq: Once | INTRAVENOUS | Status: AC
  Administered 2021-11-24: 20 mg via INTRAVENOUS
  Filled 2021-11-24: qty 100

## 2021-11-24 NOTE — Patient Instructions (Signed)
[facility] ONCOLOGY  Discharge Instructions: Thank you for choosing [facility] to provide your oncology and hematology care.   If you have a lab appointment with the [facility], please go directly to the [facility] and check in at the registration area.   Wear comfortable clothing and clothing appropriate for easy access to any Portacath or PICC line.   We strive to give you quality time with your provider. You may need to reschedule your appointment if you arrive late (15 or more minutes).  Arriving late affects you and other patients whose appointments are after yours.  Also, if you miss three or more appointments without notifying the office, you may be dismissed from the clinic at the providers discretion.      For prescription refill requests, have your pharmacy contact our office and allow 72 hours for refills to be completed.    Today you received the following chemotherapy and/or immunotherapy agents: Taxol   To help prevent nausea and vomiting after your treatment, we encourage you to take your nausea medication as directed.  BELOW ARE SYMPTOMS THAT SHOULD BE REPORTED IMMEDIATELY: *FEVER GREATER THAN 100.4 F (38 C) OR HIGHER *CHILLS OR SWEATING *NAUSEA AND VOMITING THAT IS NOT CONTROLLED WITH YOUR NAUSEA MEDICATION *UNUSUAL SHORTNESS OF BREATH *UNUSUAL BRUISING OR BLEEDING *URINARY PROBLEMS (pain or burning when urinating, or frequent urination) *BOWEL PROBLEMS (unusual diarrhea, constipation, pain near the anus) TENDERNESS IN MOUTH AND THROAT WITH OR WITHOUT PRESENCE OF ULCERS (sore throat, sores in mouth, or a toothache) UNUSUAL RASH, SWELLING OR PAIN  UNUSUAL VAGINAL DISCHARGE OR ITCHING   Items with * indicate a potential emergency and should be followed up as soon as possible or go to the Emergency Department if any problems should occur.  Please show the CHEMOTHERAPY ALERT CARD or IMMUNOTHERAPY ALERT CARD at check-in to the  Emergency Department and triage nurse.  Should you have questions after your visit or need to cancel or reschedule your appointment, please contact [facility]  Dept: [phone]  and follow the prompts.  Office hours are 8:00 a.m. to 4:30 p.m. Monday - Friday. Please note that voicemails left after 4:00 p.m. may not be returned until the following business day.  We are closed weekends and major holidays. You have access to a nurse at all times for urgent questions. Please call the main number to the clinic Dept: [phone] and follow the prompts.   For any non-urgent questions, you may also contact your provider using MyChart. We now offer e-Visits for anyone 58 and older to request care online for non-urgent symptoms. For details visit [email or web address].   Also download the MyChart app! Go to the app store, search "MyChart", open the app, select [facility], and log in with your MyChart username and password.  Due to Covid, a mask is required upon entering the hospital/clinic. If you do not have a mask, one will be given to you upon arrival. For doctor visits, patients may have 1 support person aged 67 or older with them. For treatment visits, patients cannot have anyone with them due to current Covid guidelines and our immunocompromised population.

## 2021-11-27 ENCOUNTER — Telehealth: Payer: Self-pay | Admitting: Hematology and Oncology

## 2021-11-27 NOTE — Telephone Encounter (Signed)
Updated patient on cancelled appointment per 12/16 los. Patient is aware.

## 2021-11-29 ENCOUNTER — Encounter: Payer: Self-pay | Admitting: Rehabilitation

## 2021-11-29 ENCOUNTER — Other Ambulatory Visit: Payer: Self-pay

## 2021-11-29 ENCOUNTER — Ambulatory Visit: Payer: 59 | Admitting: Rehabilitation

## 2021-11-29 DIAGNOSIS — M25611 Stiffness of right shoulder, not elsewhere classified: Secondary | ICD-10-CM

## 2021-11-29 DIAGNOSIS — C50811 Malignant neoplasm of overlapping sites of right female breast: Secondary | ICD-10-CM | POA: Diagnosis not present

## 2021-11-29 DIAGNOSIS — R293 Abnormal posture: Secondary | ICD-10-CM

## 2021-11-29 DIAGNOSIS — Z170 Estrogen receptor positive status [ER+]: Secondary | ICD-10-CM

## 2021-11-29 NOTE — Therapy (Signed)
[facility] [address] [place], [place], [address] Phone: [phone]   Fax:  [phone]  Physical Therapy Treatment  Patient Details  Name: Allison Reed MRN: [id number] Date of Birth: [birth year] Referring Provider (PT): Dr. Donne Hazel   Encounter Date: 11/29/2021   PT End of Session - 11/29/21 0851     Visit Number 10    Number of Visits 21    Date for PT Re-Evaluation 01/01/22    PT Start Time 0806    PT Stop Time 0852    PT Time Calculation (min) 46 min    Activity Tolerance Patient tolerated treatment well    Behavior During Therapy [facility] for tasks assessed/performed             Past Medical History:  Diagnosis Date   Diabetes mellitus without complication ([place])    Type 2   Family history of brain tumor    Family history of skin cancer    Gestational diabetes    Headache    otc med prn   Heartburn in pregnancy    Incompetent cervix in pregnancy 04/2015   Missed abortion    no surgery required   Postpartum care following cesarean delivery (11/2) 10/12/2015   right breast ca 06/2021   UTI in pregnancy 03/2015   recent UTI, treatment completed    Past Surgical History:  Procedure Laterality Date   APPENDECTOMY  2006   BREAST RECONSTRUCTION WITH PLACEMENT OF TISSUE EXPANDER AND ALLODERM Right 07/25/2021   Procedure: RIGHT BREAST RECONSTRUCTION WITH PLACEMENT OF TISSUE EXPANDER AND ALLODERM;  Surgeon: Irene Limbo, MD;  Location: [facility];  Service: Plastics;  Laterality: Right;   CERCLAGE LAPAROSCOPIC ABDOMINAL N/A 04/15/2015   Procedure: LAPAROSCOPIC TRANSABDOMINAL CERVCOISTHMIC CERCLAGE;  Surgeon: Governor Specking, MD;  Location: [facility];  Service: Gynecology;  Laterality: N/A;   CERVICAL CERCLAGE  02/2011   vaginal   CESAREAN SECTION N/A 10/12/2015   Procedure: CESAREAN SECTION;  Surgeon: Princess Bruins, MD;  Location: [facility];  Service: Obstetrics;  Laterality: N/A;   CESAREAN  SECTION N/A 03/03/2018   Procedure: Repeat CESAREAN SECTION/Removal of Abdominal Cerclage;  Surgeon: Azucena Fallen, MD;  Location: [facility];  Service: Obstetrics;  Laterality: N/A;  EDD: 03/28/18   MASTECTOMY W/ SENTINEL NODE BIOPSY Right 07/25/2021   Procedure: RIGHT MASTECTOMY WITH AXILLARY SENTINEL LYMPH NODE BIOPSY;  Surgeon: [name], MD;  Location: [facility];  Service: General;  Laterality: Right;   PORTACATH PLACEMENT N/A 08/24/2021   Procedure: INSERTION PORT-A-CATH;  Surgeon: [name], MD;  Location: [facility];  Service: General;  Laterality: N/A;   RADIOACTIVE SEED GUIDED AXILLARY SENTINEL LYMPH NODE Right 07/25/2021   Procedure: RADIOACTIVE SEED GUIDED RIGHT AXILLARY NODE EXCISION;  Surgeon: [name], MD;  Location: [place] [facility];  Service: General;  Laterality: Right;   [place] EXTRACTION  2001    There were no vitals filed for this visit.   Subjective Assessment - 11/29/21 0807     Subjective Nothing new    Pertinent History She is a wife of a pulmonary critical care physician . She noted a palpable right breast mass. This is followed by a mammogram with a mass with calcifications as really throughout the upper outer quadrant and in the central breast. This measures 9.4 x 8.7 x 6.7 cm. On ultrasound she had 3 separate masses at 10, 11, and 12:00. The sizes are 1.7, 2.1, and 2.2 cm.  There is also a single abnormal axillary lymph node that has undergone biopsy. 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 She is s/p right breast mastectomy with SLNB 0/7 LN on August 16 andl also had tissue  expanders in place.  She is currently undergoing chemotherapy for 6 months every other week and radiation will follow    Currently in Pain? No/denies                               [facility] Adult PT Treatment/Exercise - 11/29/21 0001       Exercises   Exercises Shoulder      Shoulder Exercises: Pulleys   Flexion 2 minutes    Flexion Limitations with focus on elbow extension      Shoulder Exercises: ROM/Strengthening   Other ROM/Strengthening Exercises single arm wall chest stretch 2x30"  with wrist extension x 5 for flossing      Manual Therapy   Soft tissue mobilization Right upper arm to axilla over area of cording with cocoa butter (after MFR)    Myofascial Release MFR to right axilla and medial arm cording with longitudinal stretch and S technique    Passive ROM Into Rt shoulder flexion, abduction and D2 flexion.                          PT Long Term Goals - 11/20/21 1618       PT LONG TERM GOAL #1   Title Pt will achieve full right shoulder ROM post surgery for full return to daily activities    Time 6    Period Weeks    Status On-going    Target Date 01/01/22      PT LONG TERM GOAL #2   Title Pt will improve right shoulder flexion and scaption by atleast 15 degrees    Baseline regressed today secondary to cording    Time 6    Period Weeks    Status On-going    Target Date 01/01/22      PT LONG TERM GOAL #3   Title Pt will report decrease achiness and pulling from cording by 50% or more    Time 6    Period Weeks    Status On-going    Target Date 01/01/22      PT LONG TERM GOAL #4   Title Pt will be able to perform arm movements to continue with her indian dancing    Baseline was able to perform but too fatigued from chemo. now cording limits    Time 6    Period Weeks    Status On-going    Target Date 01/01/22                   Plan - 11/29/21 1941     Clinical Impression Statement Continued release of Lt UE  cording from axilla to wrist.    PT Frequency 2x / week    PT Duration 6 weeks    PT Treatment/Interventions ADLs/Self Care Home Management;Therapeutic exercise;Manual techniques;Patient/family education;Manual lymph drainage;Scar mobilization;Passive range of motion;Moist Heat    PT  Next Visit Plan MFR for new cording, PROM, STM to tight areas of upper quadrant prn, Strength ABC    Consulted and Agree with Plan of Care Patient             Patient will benefit from skilled therapeutic intervention in order to improve the following deficits and impairments:     Visit Diagnosis: Malignant neoplasm of overlapping sites of right breast in female, estrogen receptor positive ([place])  Stiffness of right shoulder, not elsewhere classified  Abnormal posture     Problem List Patient Active Problem List   Diagnosis Date Noted   Port-A-Cath in place 08/25/2021   S/P mastectomy, right 07/25/2021   Genetic testing 07/01/2021   Cancer ([place]) 06/26/2021   Family history of skin cancer 06/22/2021   Family history of brain tumor 06/22/2021   Malignant neoplasm of overlapping sites of right breast in female, estrogen receptor positive ([place]) 06/21/2021   Premature uterine contractions causing threatened premature labor in third trimester 03/03/2018   Previous cesarean delivery, delivered 03/03/2018   Postpartum care following cesarean delivery (3/25) 03/03/2018   Status post repeat low transverse cesarean section 03/03/2018   DM (diabetes mellitus) in pregnancy 03/02/2018   Incompetent cervix in pregnancy 03/02/2018   Premature uterine contractions in third trimester, antepartum 03/02/2018   Active labor 10/12/2015    Stark Bray, PT 11/29/2021, 8:53 AM  [facility] @ [address] [place] [place], [place], [address] Phone: [phone]   Fax:  [phone]  Name: KERA DEACON MRN: [id number] Date of Birth: Apr 04, 1976

## 2021-12-01 ENCOUNTER — Inpatient Hospital Stay: Payer: 59

## 2021-12-04 ENCOUNTER — Inpatient Hospital Stay (HOSPITAL_COMMUNITY): Payer: 59 | Admitting: Certified Registered"

## 2021-12-04 ENCOUNTER — Ambulatory Visit (HOSPITAL_COMMUNITY)
Admission: RE | Admit: 2021-12-04 | Discharge: 2021-12-04 | Disposition: A | Discharge status: Home / Self Care | Payer: 59 | Source: Ambulatory Visit | Attending: Plastic Surgery | Admitting: Plastic Surgery

## 2021-12-04 ENCOUNTER — Other Ambulatory Visit: Payer: Self-pay

## 2021-12-04 ENCOUNTER — Encounter (HOSPITAL_COMMUNITY): Payer: Self-pay | Admitting: Plastic Surgery

## 2021-12-04 ENCOUNTER — Encounter (HOSPITAL_COMMUNITY)
Admission: RE | Disposition: A | Discharge status: Home / Self Care | Payer: Self-pay | Source: Ambulatory Visit | Attending: Plastic Surgery

## 2021-12-04 DIAGNOSIS — C50911 Malignant neoplasm of unspecified site of right female breast: Secondary | ICD-10-CM | POA: Insufficient documentation

## 2021-12-04 DIAGNOSIS — Z9011 Acquired absence of right breast and nipple: Secondary | ICD-10-CM | POA: Insufficient documentation

## 2021-12-04 DIAGNOSIS — X58XXXA Exposure to other specified factors, initial encounter: Secondary | ICD-10-CM | POA: Insufficient documentation

## 2021-12-04 DIAGNOSIS — S21001A Unspecified open wound of right breast, initial encounter: Principal | ICD-10-CM | POA: Insufficient documentation

## 2021-12-04 HISTORY — PX: TISSUE EXPANDER PLACEMENT: SHX2530

## 2021-12-04 LAB — POCT PREGNANCY, URINE: Preg Test, Ur: NEGATIVE

## 2021-12-04 LAB — GLUCOSE, CAPILLARY
Glucose-Capillary: 164 mg/dL — ABNORMAL HIGH (ref 70–99)
Glucose-Capillary: 162 mg/dL — ABNORMAL HIGH (ref 70–99)

## 2021-12-04 SURGERY — INSERTION, TISSUE EXPANDER
Anesthesia: General | Laterality: Right

## 2021-12-04 MED ORDER — FENTANYL CITRATE (PF) 100 MCG/2ML IJ SOLN
INTRAMUSCULAR | Status: AC
  Filled 2021-12-04: qty 2

## 2021-12-04 MED ORDER — BUPIVACAINE-EPINEPHRINE (PF) 0.25% -1:200000 IJ SOLN
INTRAMUSCULAR | Status: DC | PRN
  Administered 2021-12-04: 11 mL via PERINEURAL

## 2021-12-04 MED ORDER — PHENYLEPHRINE 40 MCG/ML (10ML) SYRINGE FOR IV PUSH (FOR BLOOD PRESSURE SUPPORT)
PREFILLED_SYRINGE | INTRAVENOUS | Status: DC | PRN
  Administered 2021-12-04: 120 ug via INTRAVENOUS

## 2021-12-04 MED ORDER — CHLORHEXIDINE GLUCONATE 0.12 % MT SOLN
OROMUCOSAL | Status: AC
  Administered 2021-12-04: 09:00:00 15 mL via OROMUCOSAL
  Filled 2021-12-04: qty 15

## 2021-12-04 MED ORDER — ACETAMINOPHEN 10 MG/ML IV SOLN
INTRAVENOUS | Status: AC
  Filled 2021-12-04: qty 100

## 2021-12-04 MED ORDER — PROPOFOL 10 MG/ML IV BOLUS
INTRAVENOUS | Status: DC | PRN
  Administered 2021-12-04: 140 mg via INTRAVENOUS

## 2021-12-04 MED ORDER — PROMETHAZINE HCL 25 MG/ML IJ SOLN
6.2500 mg | INTRAMUSCULAR | Status: DC | PRN
Start: 2021-12-04 — End: 2021-12-04

## 2021-12-04 MED ORDER — MIDAZOLAM HCL 2 MG/2ML IJ SOLN
INTRAMUSCULAR | Status: AC
  Filled 2021-12-04: qty 2

## 2021-12-04 MED ORDER — CHLORHEXIDINE GLUCONATE 0.12 % MT SOLN: 15.0000 mL | Freq: Once | OROMUCOSAL | Status: AC

## 2021-12-04 MED ORDER — ORAL CARE MOUTH RINSE: 15.0000 mL | Freq: Once | OROMUCOSAL | Status: AC

## 2021-12-04 MED ORDER — PROPOFOL 10 MG/ML IV BOLUS
INTRAVENOUS | Status: AC
  Filled 2021-12-04: qty 20

## 2021-12-04 MED ORDER — SODIUM CHLORIDE 0.9 % IV SOLN
INTRAVENOUS | Status: DC | PRN
  Administered 2021-12-04: 12:00:00 500 mL

## 2021-12-04 MED ORDER — ACETAMINOPHEN 10 MG/ML IV SOLN
INTRAVENOUS | Status: DC | PRN
  Administered 2021-12-04: 1000 mg via INTRAVENOUS

## 2021-12-04 MED ORDER — ONDANSETRON HCL 4 MG/2ML IJ SOLN
INTRAMUSCULAR | Status: DC | PRN
  Administered 2021-12-04: 4 mg via INTRAVENOUS

## 2021-12-04 MED ORDER — FENTANYL CITRATE (PF) 250 MCG/5ML IJ SOLN
INTRAMUSCULAR | Status: DC | PRN
  Administered 2021-12-04: 25 ug via INTRAVENOUS
  Administered 2021-12-04: 50 ug via INTRAVENOUS
  Administered 2021-12-04: 25 ug via INTRAVENOUS

## 2021-12-04 MED ORDER — LACTATED RINGERS IV SOLN: INTRAVENOUS | Status: DC

## 2021-12-04 MED ORDER — FENTANYL CITRATE (PF) 250 MCG/5ML IJ SOLN
INTRAMUSCULAR | Status: AC
  Filled 2021-12-04: qty 5

## 2021-12-04 MED ORDER — CEFAZOLIN SODIUM-DEXTROSE 2-3 GM-%(50ML) IV SOLR
INTRAVENOUS | Status: DC | PRN
  Administered 2021-12-04: 2 g via INTRAVENOUS

## 2021-12-04 MED ORDER — 0.9 % SODIUM CHLORIDE (POUR BTL) OPTIME
TOPICAL | Status: DC | PRN
  Administered 2021-12-04: 12:00:00 1000 mL

## 2021-12-04 MED ORDER — LIDOCAINE 2% (20 MG/ML) 5 ML SYRINGE
INTRAMUSCULAR | Status: DC | PRN
  Administered 2021-12-04: 80 mg via INTRAVENOUS

## 2021-12-04 MED ORDER — DEXAMETHASONE SODIUM PHOSPHATE 10 MG/ML IJ SOLN
INTRAMUSCULAR | Status: DC | PRN
  Administered 2021-12-04: 10 mg via INTRAVENOUS

## 2021-12-04 MED ORDER — BUPIVACAINE-EPINEPHRINE (PF) 0.25% -1:200000 IJ SOLN
INTRAMUSCULAR | Status: AC
  Filled 2021-12-04: qty 30

## 2021-12-04 MED ORDER — FENTANYL CITRATE (PF) 100 MCG/2ML IJ SOLN
25.0000 ug | INTRAMUSCULAR | Status: DC | PRN
  Administered 2021-12-04: 13:00:00 25 ug via INTRAVENOUS

## 2021-12-04 SURGICAL SUPPLY — 47 items
BAG COUNTER SPONGE SURGICOUNT (BAG) ×2 IMPLANT
BAG DECANTER FOR FLEXI CONT (MISCELLANEOUS) ×3 IMPLANT
BAG SURGICOUNT SPONGE COUNTING (BAG) ×1
BINDER BREAST LRG (GAUZE/BANDAGES/DRESSINGS) ×2 IMPLANT
BINDER BREAST XLRG (GAUZE/BANDAGES/DRESSINGS) IMPLANT
CANISTER SUCT 3000ML PPV (MISCELLANEOUS) ×3 IMPLANT
CHLORAPREP W/TINT 26 (MISCELLANEOUS) ×3 IMPLANT
COVER SURGICAL LIGHT HANDLE (MISCELLANEOUS) ×3 IMPLANT
DERMABOND ADVANCED (GAUZE/BANDAGES/DRESSINGS) ×2
DERMABOND ADVANCED .7 DNX12 (GAUZE/BANDAGES/DRESSINGS) ×1 IMPLANT
DRAIN CHANNEL 15F RND FF W/TCR (WOUND CARE) ×3 IMPLANT
DRAIN CHANNEL 19F RND (DRAIN) ×3 IMPLANT
DRAPE HALF SHEET 40X57 (DRAPES) ×6 IMPLANT
DRAPE TOP ARMCOVERS (MISCELLANEOUS) ×3 IMPLANT
DRAPE U-SHAPE 76X120 STRL (DRAPES) ×3 IMPLANT
DRAPE WARM FLUID 44X44 (DRAPES) ×3 IMPLANT
DRSG PAD ABDOMINAL 8X10 ST (GAUZE/BANDAGES/DRESSINGS) ×4 IMPLANT
ELECT BLADE 4.0 EZ CLEAN MEGAD (MISCELLANEOUS)
ELECT COATED BLADE 2.86 ST (ELECTRODE) ×3 IMPLANT
ELECT REM PT RETURN 9FT ADLT (ELECTROSURGICAL) ×3
ELECTRODE BLDE 4.0 EZ CLN MEGD (MISCELLANEOUS) IMPLANT
ELECTRODE REM PT RTRN 9FT ADLT (ELECTROSURGICAL) ×1 IMPLANT
EVACUATOR SILICONE 100CC (DRAIN) ×3 IMPLANT
GAUZE SPONGE 4X4 12PLY STRL (GAUZE/BANDAGES/DRESSINGS) ×3 IMPLANT
GLOVE SURG ENC MOIS LTX SZ6 (GLOVE) ×3 IMPLANT
GLOVE SURG POLYISO LF SZ6 (GLOVE) ×3 IMPLANT
GOWN STRL REUS W/ TWL LRG LVL3 (GOWN DISPOSABLE) ×2 IMPLANT
GOWN STRL REUS W/TWL LRG LVL3 (GOWN DISPOSABLE) ×4
KIT BASIN OR (CUSTOM PROCEDURE TRAY) ×3 IMPLANT
KIT TURNOVER KIT B (KITS) ×3 IMPLANT
NS IRRIG 1000ML POUR BTL (IV SOLUTION) ×4 IMPLANT
PACK GENERAL/GYN (CUSTOM PROCEDURE TRAY) ×3 IMPLANT
PAD ARMBOARD 7.5X6 YLW CONV (MISCELLANEOUS) ×3 IMPLANT
PIN SAFETY STERILE (MISCELLANEOUS) ×3 IMPLANT
STAPLER VISISTAT 35W (STAPLE) ×3 IMPLANT
SUT CHROMIC 4 0 PS 2 18 (SUTURE) IMPLANT
SUT ETHILON 2 0 FS 18 (SUTURE) ×3 IMPLANT
SUT MNCRL AB 3-0 PS2 27 (SUTURE) ×2 IMPLANT
SUT MNCRL AB 4-0 PS2 18 (SUTURE) ×3 IMPLANT
SUT VIC AB 0 CT2 27 (SUTURE) IMPLANT
SUT VIC AB 4-0 PS2 27 (SUTURE) ×3 IMPLANT
SUT VLOC 180 0 24IN GS25 (SUTURE) ×3 IMPLANT
SWAB COLLECTION DEVICE MRSA (MISCELLANEOUS) ×2 IMPLANT
SWAB CULTURE ESWAB REG 1ML (MISCELLANEOUS) ×2 IMPLANT
TOWEL GREEN STERILE (TOWEL DISPOSABLE) ×3 IMPLANT
TOWEL GREEN STERILE FF (TOWEL DISPOSABLE) ×3 IMPLANT
TRAY FOLEY MTR SLVR 14FR STAT (SET/KITS/TRAYS/PACK) IMPLANT

## 2021-12-04 NOTE — Anesthesia Procedure Notes (Signed)
Procedure Name: LMA Insertion Date/Time: 12/04/2021 11:37 AM Performed by: Dorthea Cove, CRNA Pre-anesthesia Checklist: Patient identified, Emergency Drugs available, Suction available and Patient being monitored Patient Re-evaluated:Patient Re-evaluated prior to induction Oxygen Delivery Method: Circle System Utilized Preoxygenation: Pre-oxygenation with 100% oxygen Induction Type: IV induction Ventilation: Mask ventilation without difficulty LMA: LMA inserted LMA Size: 4.0 Number of attempts: 1 Airway Equipment and Method: Bite block Placement Confirmation: positive ETCO2 Tube secured with: Tape Dental Injury: Teeth and Oropharynx as per pre-operative assessment

## 2021-12-04 NOTE — Op Note (Signed)
Operative Note   DATE OF OPERATION: 12.26.22  LOCATION: [facility]  SURGICAL DIVISION: Plastic Surgery  PREOPERATIVE DIAGNOSES:  1. Open wound right chest 2. Acquired absence breast 3. Right breast ca overlapping sites ER+  POSTOPERATIVE DIAGNOSES:  same  PROCEDURE:  Removal right chest tissue expander  SURGEON: Irene Limbo MD MBA  ASSISTANT: none  ANESTHESIA:  General.   EBL: 619 ml  COMPLICATIONS: None immediate.   INDICATIONS FOR PROCEDURE:  The patient, Allison Reed, is a 45 y.o. female born on December 11, 1975, is here for removal right chest tissue expander.   FINDINGS: Few mm skin ulceration present along mastectomy scar.  Threatened exposure tissue expander. Small volume clear seroma fluid within pocket. Removed intact tissue expander and near entirety acellular dermis.  DESCRIPTION OF PROCEDURE:  The patient's operative site was marked with the patient in the preoperative area. The patient was taken to the operating room. SCDs were placed and IV antibiotics were given. The patient's operative site was prepped and draped in a sterile fashion. A time out was performed and all information was confirmed to be correct.  Elliptical excision of attenuated vertical mastectomy scar completed sharply. Seroma fluid sent for culture. Acellular dermis beneath ulceration noted to be unincorporated. Remainder ADM noted to be well incorporated. Acellular dermis excised from majority pocket. Wound irrigated with saline solution containing Ancef gentamicin. 9 Fr JP placed percutaneously and secured with 2-0 nylon. Closure completed with 3-0 monocryl in dermis and superficial fascia, 4-0 monocryl subcuticular skin closure. Steri strips applied followed by dry dressing breast binder.  The patient was allowed to wake from anesthesia, extubated and taken to the recovery room in satisfactory condition.   SPECIMENS: seroma for culture  DRAINS: 19 Fr JP in right subcutaneous chest

## 2021-12-04 NOTE — H&P (Signed)
Subjective:     Patient ID: Allison Reed is a 45 y.o. female.   HPI   4.5 months post op right mastectomy with immediate reconstruction. Developed onset drainage right chest over last 4-5 days. Denies fever. Patient undergoing adjuvant chemotherapy.   Presented with palpable right breast mass. Diagnostic MMG showed a piculated mass in the palpable area right UOQ with microcalcifications throughout the central and UOQ, area of involvement measures 9.4 x 6.2 x 8.7 cm. [place] noted lesion right breast 10 o'clock 8 cmfn palpable area 2.7 x 1.7 x 1.1 cm, a 11 o clock lesion 6cmfn 1.6 x 0.8 x 2.1 cm, and a 12 o'clock lesion 8 cmfn 1.9 x 0.4 x 2.2 cm. Single abnormal right axillary LN noted. Biopsies labeled 10 o clock, 11 o clock, and 12 o clock each with IDC with DCIS, ER/PR+, Her2-. LN + for IDC.    MRI demonstrated extensive disease with both mass and NME involving the majority of the superior and upper outer right breast. No definite involvement of the skin, nipple-areolar complex or pectoralis muscle. Single abnormal right axillary LN present. Indeterminate 0.8 and 0.9 cm masses in the outer left breast. Left breast US guided biopsy 2 o clock benign. At time of scheduled MR guided biopsy of second mass, no enhancement noted.   Bone scan negative. CT CAP showed sclerotic lesion of the medial left clavicle, concern for metastatic disease.  A right axillary LN measures 0.9 cm in short axis and a left subpectoral LN measures 0.8 cm in short axis present. PET scan 8.22 normal   Mammaprint low risk. She will receive adjuvant radiation.    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 Margins clears, DCIS < 1 mm from anterior margin over broad area, focally < 1 mm from posterior margin.  4/7 LN +. Given number of LN involved, chemotherapy recommended.   Genetics negative.   Prior C cup happy with this. 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 Lives with spouse and two kids.   Review of Systems      Objective:   Physical Exam Cardiovascular:     Rate and Rhythm: Normal rate and regular rhythm.     Heart sounds: Normal heart sounds.  Pulmonary:     Effort: Pulmonary effort is normal.     Breath sounds: Normal breath sounds.     Chest:  Right chest expanded with 1-2 mm ulceration chest with small serous drainage, vertical scar attenuated with peeling of skin over lower pole  Assessment:     Right breast ca overlapping sites ER+ S/p right SRM, SLN, prepectoral TE/ADM (Alloderm) reconstruction    Plan:     New onset drainage in setting chemotherapy. Likely exposure TE and threatened full thickness ulceration. Plan OP surgery drain placement.

## 2021-12-04 NOTE — Anesthesia Preprocedure Evaluation (Addendum)
Anesthesia Evaluation  Patient identified by MRN, date of birth, ID band Patient awake    Reviewed: Allergy & Precautions, NPO status , Patient's Chart, lab work & pertinent test results  Airway Mallampati: III  TM Distance: >3 FB Neck ROM: Full    Dental  (+) Teeth Intact, Dental Advisory Given   Pulmonary neg pulmonary ROS,    Pulmonary exam normal breath sounds clear to auscultation       Cardiovascular negative cardio ROS Normal cardiovascular exam Rhythm:Regular Rate:Normal     Neuro/Psych  Headaches, negative psych ROS   GI/Hepatic negative GI ROS, Neg liver ROS,   Endo/Other  diabetes, Type 2, Oral Hypoglycemic Agents  Renal/GU negative Renal ROS     Musculoskeletal negative musculoskeletal ROS (+)   Abdominal   Peds  Hematology  (+) Blood dyscrasia, anemia ,   Anesthesia Other Findings Day of surgery medications reviewed with the patient.   seroma right breast s/p breast cancer  Reproductive/Obstetrics                            Anesthesia Physical Anesthesia Plan  ASA: 2  Anesthesia Plan: General   Post-op Pain Management: Ofirmev IV (intra-op)   Induction: Intravenous  PONV Risk Score and Plan: 3 and Midazolam, Dexamethasone and Ondansetron  Airway Management Planned: LMA  Additional Equipment:   Intra-op Plan:   Post-operative Plan: Extubation in OR  Informed Consent: I have reviewed the patients History and Physical, chart, labs and discussed the procedure including the risks, benefits and alternatives for the proposed anesthesia with the patient or authorized representative who has indicated his/her understanding and acceptance.     Dental advisory given  Plan Discussed with: CRNA  Anesthesia Plan Comments:        Anesthesia Quick Evaluation

## 2021-12-04 NOTE — Transfer of Care (Signed)
Immediate Anesthesia Transfer of Care Note  Patient: Allison Reed  Procedure(s) Performed: REMOVAL RIGHT CHEST TISSUE EXPANDER (Right)  Patient Location: PACU  Anesthesia Type:General  Level of Consciousness: drowsy  Airway & Oxygen Therapy: Patient Spontanous Breathing and Patient connected to nasal cannula oxygen  Post-op Assessment: Report given to RN and Post -op Vital signs reviewed and stable  Post vital signs: Reviewed and stable  Last Vitals:  Vitals Value Taken Time  BP 124/78 12/04/21 1225  Temp    Pulse 88 12/04/21 1227  Resp 16 12/04/21 1227  SpO2 100 % 12/04/21 1227  Vitals shown include unvalidated device data.  Last Pain:  Vitals:   12/04/21 0918  TempSrc:   PainSc: 0-No pain      Patients Stated Pain Goal: 2 (77/93/96 8864)  Complications: No notable events documented.

## 2021-12-04 NOTE — Anesthesia Postprocedure Evaluation (Signed)
Anesthesia Post Note  Patient: Allison Reed  Procedure(s) Performed: REMOVAL RIGHT CHEST TISSUE EXPANDER (Right)     Patient location during evaluation: PACU Anesthesia Type: General Level of consciousness: awake and alert Pain management: pain level controlled Vital Signs Assessment: post-procedure vital signs reviewed and stable Respiratory status: spontaneous breathing, nonlabored ventilation and respiratory function stable Cardiovascular status: blood pressure returned to baseline and stable Postop Assessment: no apparent nausea or vomiting Anesthetic complications: no   No notable events documented.  Last Vitals:  Vitals:   12/04/21 1300 12/04/21 1315  BP: 119/79 115/81  Pulse: 77 75  Resp: 11 10  Temp:  36.7 C  SpO2: 98% 98%    Last Pain:  Vitals:   12/04/21 1245  TempSrc:   PainSc: 5                  Catalina Gravel

## 2021-12-05 ENCOUNTER — Encounter (HOSPITAL_COMMUNITY): Payer: Self-pay | Admitting: Plastic Surgery

## 2021-12-08 ENCOUNTER — Inpatient Hospital Stay: Payer: 59

## 2021-12-08 ENCOUNTER — Inpatient Hospital Stay: Payer: 59 | Admitting: Adult Health

## 2021-12-09 LAB — AEROBIC/ANAEROBIC CULTURE W GRAM STAIN (SURGICAL/DEEP WOUND): Culture: NO GROWTH

## 2021-12-12 ENCOUNTER — Other Ambulatory Visit: Payer: Self-pay

## 2021-12-12 ENCOUNTER — Ambulatory Visit: Payer: 59 | Attending: Plastic Surgery

## 2021-12-12 DIAGNOSIS — R293 Abnormal posture: Secondary | ICD-10-CM | POA: Diagnosis present

## 2021-12-12 DIAGNOSIS — C50811 Malignant neoplasm of overlapping sites of right female breast: Secondary | ICD-10-CM | POA: Insufficient documentation

## 2021-12-12 DIAGNOSIS — Z170 Estrogen receptor positive status [ER+]: Secondary | ICD-10-CM | POA: Diagnosis present

## 2021-12-12 DIAGNOSIS — M25611 Stiffness of right shoulder, not elsewhere classified: Secondary | ICD-10-CM | POA: Diagnosis present

## 2021-12-12 NOTE — Therapy (Signed)
[facility] @ [place], [place], [address] Phone: [phone]   Fax:  [phone]  Physical Therapy Treatment  Patient Details  Name: Allison Reed MRN: [id number] Date of Birth: 02-May-1976 Referring Provider (PT): Dr. Donne Haz[facility]   Encounter Date: 12/12/2021   PT End of Session - 12/12/21 0919     Visit Number 11    Number of Visits 21    Date for PT Re-Evaluation 01/01/22    PT Start Time 0806    PT Stop Time 0906    PT Time Calculation (min) 60 min    Activity Tolerance Patient tolerated treatment well;Patient limited by pain    Behavior During Therapy [facility] for tasks assessed/performed             Past Medical History:  Diagnosis Date   Diabetes mellitus without complication ([place])    Type 2   Family history of brain tumor    Family history of skin cancer    Gestational diabetes    Headache    otc med prn   Heartburn in pregnancy    Incompetent cervix in pregnancy 04/2015   Missed abortion    no surgery required   Postpartum care following cesarean delivery (11/2) 10/12/2015   right breast ca 06/2021   UTI in pregnancy 03/2015   recent UTI, treatment completed    Past Surgical History:  Procedure Laterality Date   APPENDECTOMY  2006   BREAST RECONSTRUCTION WITH PLACEMENT OF TISSUE EXPANDER AND ALLODERM Right 07/25/2021   Procedure: RIGHT BREAST RECONSTRUCTION WITH PLACEMENT OF TISSUE EXPANDER AND ALLODERM;  Surgeon: Irene Limbo, MD;  Location: [facility];  Service: Plastics;  Laterality: Right;   CERCLAGE LAPAROSCOPIC ABDOMINAL N/A 04/15/2015   Procedure: LAPAROSCOPIC TRANSABDOMINAL CERVCOISTHMIC CERCLAGE;  Surgeon: Governor Specking, MD;  Location: [facility];  Service: Gynecology;  Laterality: N/A;   CERVICAL CERCLAGE  02/2011   vaginal   CESAREAN SECTION N/A 10/12/2015   Procedure: CESAREAN SECTION;  Surgeon: Princess Bruins, MD;  Location: [facility];  Service: Obstetrics;   Laterality: N/A;   CESAREAN SECTION N/A 03/03/2018   Procedure: Repeat CESAREAN SECTION/Removal of Abdominal Cerclage;  Surgeon: Azucena Fallen, MD;  Location: [facility];  Service: Obstetrics;  Laterality: N/A;  EDD: 03/28/18   MASTECTOMY W/ SENTINEL NODE BIOPSY Right 07/25/2021   Procedure: RIGHT MASTECTOMY WITH AXILLARY SENTINEL LYMPH NODE BIOPSY;  Surgeon: [name], MD;  Location: [facility];  Service: General;  Laterality: Right;   PORTACATH PLACEMENT N/A 08/24/2021   Procedure: INSERTION PORT-A-CATH;  Surgeon: [name], MD;  Location: [facility];  Service: General;  Laterality: N/A;   RADIOACTIVE SEED GUIDED AXILLARY SENTINEL LYMPH NODE Right 07/25/2021   Procedure: RADIOACTIVE SEED GUIDED RIGHT AXILLARY NODE EXCISION;  Surgeon: [name], MD;  Location: [facility];  Service: General;  Laterality: Right;   TISSUE EXPANDER PLACEMENT Right 12/04/2021   Procedure: REMOVAL RIGHT CHEST TISSUE EXPANDER;  Surgeon: Irene Limbo, MD;  Location: [facility];  Service: Plastics;  Laterality: Right;   WISDOM TOOTH EXTRACTION  2001    There were no vitals filed for this visit.   Subjective Assessment - 12/12/21 0911     Subjective I had to have emergency surgery the day after Christmas to remove my expander as it was becoming infected and the dotor said the skin around it was thinning. We were in [place] so had to drive home on Christmas day. I had  1 drain placed after on that Monday, 12/26, and she removed it Friday 12/30. My incision is healing really well and Dr. Iran Planas cleared me to resume physical therapy this week. The cording in my Rt medial upper arm feels worse and is very tender to touch, worse so since the surgery.    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 She noted a palpable right breast mass. This is followed by a mammogram with a mass with calcifications as really throughout the  upper outer quadrant and in the central breast. This measures 9.4 x 8.7 x 6.7 cm. On ultrasound she had 3 separate masses at 10, 11, and 12:00. The sizes are 1.7, 2.1, and 2.2 cm. There is also a single abnormal axillary lymph node that has undergone biopsy. 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 She is s/p right breast mastectomy with SLNB 0/7 LN on August 16 andl also had tissue expanders in place.  She is currently undergoing chemotherapy for 6 months every other week and radiation will follow; emergency sugery to remove expander 12/04/21    Patient Stated Goals Reassess after surgery    Currently in Pain? No/denies   cording is just very tender to touch                              [facility] Adult PT Treatment/Exercise - 12/12/21 0001       Manual Therapy   Manual Therapy Soft tissue mobilization;Myofascial release;Scapular mobilization;Passive ROM    Soft tissue mobilization Right upper arm to axilla over area of cording with cocoa butter (after MFR); kept arm at side when using cocoa butter as pt was very tender to touch along cording; then in Lt S/L to Rt upper trap and medial scapular border, then finished working UT in supine with gentle cervical P/ROM to Lt for incresaed Rt UT stretch    Myofascial Release MFR to right axilla and medial arm cording with longitudinal stretch with very gentle pressure but pt did not tolerate this long due to increased tenderness since surgery, more so at antecubital fossa into medial upper arm, axilla was not quite as tender    Scapular Mobilization In Lt S/L into retraction and then scapular depression throughout P/ROM, pt  was very tight with this so included the STM to UT area    Passive ROM In supine into Rt shoulder flexion, abduction and D2 flexion slowly to pts tolerance which improved throughout session as cording tightness slightly improved; briefly at end of session into Lt cervical side bend to stretch UT whihc is tight from increasd compensations since emergency surgery                          PT Long Term Goals - 11/20/21 1618       PT LONG TERM GOAL #1   Title Pt will achieve full right shoulder ROM post surgery for full return to daily activities    Time 6    Period Weeks    Status On-going    Target Date 01/01/22  PT LONG TERM GOAL #2   Title Pt will improve right shoulder flexion and scaption by atleast 15 degrees    Baseline regressed today secondary to cording    Time 6    Period Weeks    Status On-going    Target Date 01/01/22      PT LONG TERM GOAL #3   Title Pt will report decrease achiness and pulling from cording by 50% or more    Time 6    Period Weeks    Status On-going    Target Date 01/01/22      PT LONG TERM GOAL #4   Title Pt will be able to perform arm movements to continue with her indian dancing    Baseline was able to perform but too fatigued from chemo. now cording limits    Time 6    Period Weeks    Status On-going    Target Date 01/01/22                   Plan - 12/12/21 5465     Clinical Impression Statement Pt had emergency surgery to remove her Rt expander on 12/04/21. Allison Reed, PT assessed pt at beginning of session and pts incision is well healed and so appropriate to continued MFR and manual therapy for Rt UE cording. Pt was very tender to touch at medial upper arm to antecubital fossa so MFR here was limited due to pain. She did tolerate this at beginning of session, then transitioned to [facility] with cocoa butter to same area with arm at side and pt tolerated this well and was able to report area of cording feeling less  tender with end P/ROM by end of session. Also included Rt upper trap STM and stretching as she was very tight here due to increased compensations since recent surgery. Overall pt reports her cording felt some improved by end of session and her Rt cervical area was more relaxed. Encouraged her to cont to be aware of posture as she conts to recover as she has found herself in forward rounded posture multiple times since the surgery. Pt able to verbalize good understanding.    Personal Factors and Comorbidities Comorbidity 1    Comorbidities right breast cancer s/p mastectomy; presently having chemo and pending radiation; recent emergency surgery to remove expander    Stability/Clinical Decision Making Stable/Uncomplicated    Rehab Potential Excellent    PT Frequency 2x / week    PT Duration 6 weeks    PT Treatment/Interventions ADLs/Self Care Home Management;Therapeutic exercise;Manual techniques;Patient/family education;Manual lymph drainage;Scar mobilization;Passive range of motion;Moist Heat    PT Next Visit Plan MFR for new cording, PROM, STM to tight areas of upper quadrant prn, Strength ABC    PT Home Exercise Plan 4 post op exs , supine wand flexion and scaption; modified downward dog on wall, supine scapular series    Consulted and Agree with Plan of Care Patient             Patient will benefit from skilled therapeutic intervention in order to improve the following deficits and impairments:  Postural dysfunction, Decreased knowledge of precautions, Decreased scar mobility, Increased fascial restricitons, Decreased range of motion  Visit Diagnosis: Malignant neoplasm of overlapping sites of right breast in female, estrogen receptor positive ([facility])  Stiffness of right shoulder, not elsewhere classified  Abnormal posture     Problem List Patient Active Problem List   Diagnosis Date Noted   Breast cancer, right ([place])  12/04/2021   Port-A-Cath in place 08/25/2021   S/P mastectomy,  right 07/25/2021   Genetic testing 07/01/2021   Cancer ([place]) 06/26/2021   Family history of skin cancer 06/22/2021   Family history of brain tumor 06/22/2021   Malignant neoplasm of overlapping sites of right breast in female, estrogen receptor positive ([place]) 06/21/2021   Premature uterine contractions causing threatened premature labor in third trimester 03/03/2018   Previous cesarean delivery, delivered 03/03/2018   Postpartum care following cesarean delivery (3/25) 03/03/2018   Status post repeat low transverse cesarean section 03/03/2018   DM (diabetes mellitus) in pregnancy 03/02/2018   Incompetent cervix in pregnancy 03/02/2018   Premature uterine contractions in third trimester, antepartum 03/02/2018   Active labor 10/12/2015    Allison Reed, PTA 12/12/2021, 9:28 AM  [facility] @ [place], [place], [address] Phone: [phone]   Fax:  [phone]  Name: Allison Reed MRN: [id number] Date of Birth: May 04, 1976

## 2021-12-14 ENCOUNTER — Ambulatory Visit: Payer: 59

## 2021-12-14 ENCOUNTER — Other Ambulatory Visit: Payer: Self-pay

## 2021-12-14 DIAGNOSIS — R293 Abnormal posture: Secondary | ICD-10-CM

## 2021-12-14 DIAGNOSIS — Z170 Estrogen receptor positive status [ER+]: Secondary | ICD-10-CM

## 2021-12-14 DIAGNOSIS — C50811 Malignant neoplasm of overlapping sites of right female breast: Secondary | ICD-10-CM | POA: Diagnosis not present

## 2021-12-14 DIAGNOSIS — M25611 Stiffness of right shoulder, not elsewhere classified: Secondary | ICD-10-CM

## 2021-12-14 NOTE — Therapy (Signed)
[facility] @ [place], [place], [address] Phone: [phone]   Fax:  [phone]  Physical Therapy Treatment  Patient Details  Name: Allison Reed MRN: [id number] Date of Birth: 14-Feb-1976 Referring Provider (PT): Dr. Donne [facility]   Encounter Date: 12/14/2021   PT End of Session - 12/14/21 1700     Visit Number 12    Number of Visits 21    Date for PT Re-Evaluation 01/01/22    PT Start Time 1605    PT Stop Time 1655    PT Time Calculation (min) 50 min    Activity Tolerance Patient tolerated treatment well;Patient limited by pain    Behavior During Therapy [facility] for tasks assessed/performed             Past Medical History:  Diagnosis Date   Diabetes mellitus without complication ([place])    Type 2   Family history of brain tumor    Family history of skin cancer    Gestational diabetes    Headache    otc med prn   Heartburn in pregnancy    Incompetent cervix in pregnancy 04/2015   Missed abortion    no surgery required   Postpartum care following cesarean delivery (11/2) 10/12/2015   right breast ca 06/2021   UTI in pregnancy 03/2015   recent UTI, treatment completed    Past Surgical History:  Procedure Laterality Date   APPENDECTOMY  2006   BREAST RECONSTRUCTION WITH PLACEMENT OF TISSUE EXPANDER AND ALLODERM Right 07/25/2021   Procedure: RIGHT BREAST RECONSTRUCTION WITH PLACEMENT OF TISSUE EXPANDER AND ALLODERM;  Surgeon: Irene Limbo, MD;  Location: [facility];  Service: Plastics;  Laterality: Right;   CERCLAGE LAPAROSCOPIC ABDOMINAL N/A 04/15/2015   Procedure: LAPAROSCOPIC TRANSABDOMINAL CERVCOISTHMIC CERCLAGE;  Surgeon: Governor Specking, MD;  Location: [facility];  Service: Gynecology;  Laterality: N/A;   CERVICAL CERCLAGE  02/2011   vaginal   CESAREAN SECTION N/A 10/12/2015   Procedure: CESAREAN SECTION;  Surgeon: Princess Bruins, MD;  Location: [facility];  Service: Obstetrics;   Laterality: N/A;   CESAREAN SECTION N/A 03/03/2018   Procedure: Repeat CESAREAN SECTION/Removal of Abdominal Cerclage;  Surgeon: Azucena Fallen, MD;  Location: [facility];  Service: Obstetrics;  Laterality: N/A;  EDD: 03/28/18   MASTECTOMY W/ SENTINEL NODE BIOPSY Right 07/25/2021   Procedure: RIGHT MASTECTOMY WITH AXILLARY SENTINEL LYMPH NODE BIOPSY;  Surgeon: [name], MD;  Location: [facility];  Service: General;  Laterality: Right;   PORTACATH PLACEMENT N/A 08/24/2021   Procedure: INSERTION PORT-A-CATH;  Surgeon: [name], MD;  Location: [facility];  Service: General;  Laterality: N/A;   RADIOACTIVE SEED GUIDED AXILLARY SENTINEL LYMPH NODE Right 07/25/2021   Procedure: RADIOACTIVE SEED GUIDED RIGHT AXILLARY NODE EXCISION;  Surgeon: [name], MD;  Location: [facility];  Service: General;  Laterality: Right;   TISSUE EXPANDER PLACEMENT Right 12/04/2021   Procedure: REMOVAL RIGHT CHEST TISSUE EXPANDER;  Surgeon: Irene Limbo, MD;  Location: [facility];  Service: Plastics;  Laterality: Right;   WISDOM TOOTH EXTRACTION  2001    There were no vitals filed for this visit.   Subjective Assessment - 12/14/21 1607     Subjective I really like the sleeve (TG soft) but I washed it and it fell all apart. Can I get another.  I feel really tight on my right side and I thought it was the expander, but now that the expander is out  it still feels tight.    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 She noted a palpable right breast mass. This is followed by a mammogram with a mass with calcifications as really throughout the upper outer quadrant and in the central breast. This measures 9.4 x 8.7 x 6.7 cm. On ultrasound she had 3 separate masses at 10, 11, and 12:00. The sizes are 1.7, 2.1, and 2.2 cm. There is also a single abnormal axillary lymph node that has undergone biopsy. 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 She is s/p right breast mastectomy with SLNB 0/7 LN on August 16 andl also had tissue expanders in place.  She is currently undergoing chemotherapy for 6 months every other week and radiation will follow; emergency sugery to remove expander 12/04/21    Patient Stated Goals Reassess after surgery    Currently in Pain? No/denies                               [facility] Adult PT Treatment/Exercise - 12/14/21 0001       Manual Therapy   Manual Therapy Soft tissue mobilization;Myofascial release;Passive ROM    Soft tissue mobilization prior to MFR STM to pectoralis, UT, and lats and after MFR to right UE wrist to axilla with long light strokes. 1 pop noted with long gentle strokes and pt had good relief    Myofascial Release MFR to right medial arm, axilla, and forearm area of cording. Gently at upper arm due to increased pain.    Passive ROM In supine into Rt shoulder flexion, abduction and D2 flexion slowly to pts tolerance which improved throughout session as cording tightness slightly improved;                          PT Long Term Goals - 11/20/21 1618       PT LONG TERM GOAL #1   Title Pt will achieve full right shoulder ROM post surgery for full return to daily activities    Time 6    Period Weeks    Status On-going    Target Date 01/01/22      PT LONG TERM GOAL #2   Title Pt will improve right shoulder flexion and scaption by atleast 15 degrees    Baseline regressed today secondary to cording    Time 6    Period Weeks    Status On-going    Target Date 01/01/22      PT LONG TERM GOAL #3   Title Pt will report  decrease achiness and pulling from cording by 50% or more    Time 6    Period Weeks    Status On-going    Target Date 01/01/22      PT LONG TERM GOAL #4   Title Pt will be able to perform arm movements to continue with her indian dancing    Baseline was able to perform but too fatigued from chemo. now cording limits    Time 6    Period Weeks    Status On-going  Target Date 01/01/22                   Plan - 12/14/21 1700     Clinical Impression Statement Pt cotinues with complaints of discomfort with cording in the right UE from the upper arm to the forearm.  Continued soft tissue mobilization to pectoralis and lats and UT in supine, then performed MFR techniques to areas of cording, gently in places due to pain, and ended with gentle soft tissue mobilization and effleurage to the right medial arm.  1 pop was noted during soft tissue work on arm.  Pt felt much better after treatment.  She was advised to call her insurance company to see if they cover compression with an approved provider.  She may be covered at [facility] as well.  She was given a script and will check with insurance tomorrow if possible    Personal Factors and Comorbidities Comorbidity 1    Comorbidities right breast cancer s/p mastectomy; presently having chemo and pending radiation; recent emergency surgery to remove expander    Stability/Clinical Decision Making Stable/Uncomplicated    Rehab Potential Excellent    PT Frequency 2x / week    PT Duration 6 weeks    PT Treatment/Interventions ADLs/Self Care Home Management;Therapeutic exercise;Manual techniques;Patient/family education;Manual lymph drainage;Scar mobilization;Passive range of motion;Moist Heat    PT Next Visit Plan MFR for new cording, PROM, STM to tight areas of upper quadrant prn, Strength ABC. did pt call insurance company about covering compression    PT Home Exercise Plan 4 post op exs , supine wand flexion and scaption; modified downward  dog on wall, supine scapular series    Consulted and Agree with Plan of Care Patient             Patient will benefit from skilled therapeutic intervention in order to improve the following deficits and impairments:  Postural dysfunction, Decreased knowledge of precautions, Decreased scar mobility, Increased fascial restricitons, Decreased range of motion  Visit Diagnosis: Malignant neoplasm of overlapping sites of right breast in female, estrogen receptor positive ([place])  Stiffness of right shoulder, not elsewhere classified  Abnormal posture     Problem List Patient Active Problem List   Diagnosis Date Noted   Breast cancer, right ([place]) 12/04/2021   Port-A-Cath in place 08/25/2021   S/P mastectomy, right 07/25/2021   Genetic testing 07/01/2021   Cancer ([place]) 06/26/2021   Family history of skin cancer 06/22/2021   Family history of brain tumor 06/22/2021   Malignant neoplasm of overlapping sites of right breast in female, estrogen receptor positive ([place]) 06/21/2021   Premature uterine contractions causing threatened premature labor in third trimester 03/03/2018   Previous cesarean delivery, delivered 03/03/2018   Postpartum care following cesarean delivery (3/25) 03/03/2018   Status post repeat low transverse cesarean section 03/03/2018   DM (diabetes mellitus) in pregnancy 03/02/2018   Incompetent cervix in pregnancy 03/02/2018   Premature uterine contractions in third trimester, antepartum 03/02/2018   Active labor 10/12/2015    Claris Pong, PT 12/14/2021, 5:09 PM  [facility] @ [place], [place], [address] Phone: [phone]   Fax:  [phone]  Name: Allison Reed MRN: [id number] Date of Birth: 1976-08-07

## 2021-12-15 ENCOUNTER — Inpatient Hospital Stay: Payer: 59

## 2021-12-15 ENCOUNTER — Inpatient Hospital Stay: Payer: 59 | Attending: Hematology and Oncology

## 2021-12-15 VITALS — BP 108/69 | HR 98 | Temp 98.4°F | Resp 18

## 2021-12-15 DIAGNOSIS — Z9011 Acquired absence of right breast and nipple: Secondary | ICD-10-CM | POA: Insufficient documentation

## 2021-12-15 DIAGNOSIS — Z5111 Encounter for antineoplastic chemotherapy: Secondary | ICD-10-CM | POA: Diagnosis present

## 2021-12-15 DIAGNOSIS — Z7984 Long term (current) use of oral hypoglycemic drugs: Secondary | ICD-10-CM | POA: Insufficient documentation

## 2021-12-15 DIAGNOSIS — G620 Drug-induced polyneuropathy: Secondary | ICD-10-CM | POA: Insufficient documentation

## 2021-12-15 DIAGNOSIS — Z170 Estrogen receptor positive status [ER+]: Secondary | ICD-10-CM

## 2021-12-15 DIAGNOSIS — E119 Type 2 diabetes mellitus without complications: Secondary | ICD-10-CM | POA: Diagnosis not present

## 2021-12-15 DIAGNOSIS — C50811 Malignant neoplasm of overlapping sites of right female breast: Secondary | ICD-10-CM

## 2021-12-15 DIAGNOSIS — R5383 Other fatigue: Secondary | ICD-10-CM | POA: Insufficient documentation

## 2021-12-15 DIAGNOSIS — Z95828 Presence of other vascular implants and grafts: Secondary | ICD-10-CM

## 2021-12-15 LAB — CMP (CANCER CENTER ONLY)
ALT: 23 U/L (ref 0–44)
AST: 19 U/L (ref 15–41)
Albumin: 4.3 g/dL (ref 3.5–5.0)
Alkaline Phosphatase: 62 U/L (ref 38–126)
Anion gap: 10 (ref 5–15)
BUN: 8 mg/dL (ref 6–20)
CO2: 23 mmol/L (ref 22–32)
Calcium: 9.1 mg/dL (ref 8.9–10.3)
Chloride: 104 mmol/L (ref 98–111)
Creatinine: 0.58 mg/dL (ref 0.44–1.00)
GFR, Estimated: >60 mL/min (ref >60)
Glucose, Bld: 199 mg/dL — ABNORMAL HIGH (ref 70–99)
Potassium: 4.2 mmol/L (ref 3.5–5.1)
Sodium: 137 mmol/L (ref 135–145)
Total Bilirubin: 0.4 mg/dL (ref 0.3–1.2)
Total Protein: 6.9 g/dL (ref 6.5–8.1)

## 2021-12-15 LAB — CBC WITH DIFFERENTIAL (CANCER CENTER ONLY)
Abs Immature Granulocytes: 0.03 10*3/uL (ref 0.00–0.07)
Basophils Absolute: 0 10*3/uL (ref 0.0–0.1)
Basophils Relative: 1 %
Eosinophils Absolute: 0.2 10*3/uL (ref 0.0–0.5)
Eosinophils Relative: 3 %
HCT: 36.1 % (ref 36.0–46.0)
Hemoglobin: 11.5 g/dL — ABNORMAL LOW (ref 12.0–15.0)
Immature Granulocytes: 1 %
Lymphocytes Relative: 22 %
Lymphs Abs: 1.3 10*3/uL (ref 0.7–4.0)
MCH: 25.7 pg — ABNORMAL LOW (ref 26.0–34.0)
MCHC: 31.9 g/dL (ref 30.0–36.0)
MCV: 80.6 fL (ref 80.0–100.0)
Monocytes Absolute: 0.4 10*3/uL (ref 0.1–1.0)
Monocytes Relative: 7 %
Neutro Abs: 3.9 10*3/uL (ref 1.7–7.7)
Neutrophils Relative %: 66 %
Platelet Count: 320 10*3/uL (ref 150–400)
RBC: 4.48 MIL/uL (ref 3.87–5.11)
RDW: 15.7 % — ABNORMAL HIGH (ref 11.5–15.5)
WBC Count: 5.8 10*3/uL (ref 4.0–10.5)
nRBC: 0 % (ref 0.0–0.2)

## 2021-12-15 MED ORDER — FAMOTIDINE 20 MG IN NS 100 ML IVPB
20.0000 mg | Freq: Once | INTRAVENOUS | Status: AC
  Administered 2021-12-15: 20 mg via INTRAVENOUS
  Filled 2021-12-15: qty 100

## 2021-12-15 MED ORDER — SODIUM CHLORIDE 0.9% FLUSH
10.0000 mL | INTRAVENOUS | Status: DC | PRN
  Administered 2021-12-15: 10 mL

## 2021-12-15 MED ORDER — DEXAMETHASONE SODIUM PHOSPHATE 10 MG/ML IJ SOLN
4.0000 mg | Freq: Once | INTRAMUSCULAR | Status: AC
  Administered 2021-12-15: 4 mg via INTRAVENOUS
  Filled 2021-12-15: qty 1

## 2021-12-15 MED ORDER — SODIUM CHLORIDE 0.9 % IV SOLN: Freq: Once | INTRAVENOUS | Status: AC

## 2021-12-15 MED ORDER — ALTEPLASE 2 MG IJ SOLR
2.0000 mg | Freq: Once | INTRAMUSCULAR | Status: AC
  Administered 2021-12-15: 2 mg
  Filled 2021-12-15: qty 2

## 2021-12-15 MED ORDER — SODIUM CHLORIDE 0.9% FLUSH
10.0000 mL | Freq: Once | INTRAVENOUS | Status: AC
  Administered 2021-12-15: 10 mL

## 2021-12-15 MED ORDER — SODIUM CHLORIDE 0.9 % IV SOLN
80.0000 mg/m2 | Freq: Once | INTRAVENOUS | Status: AC
  Administered 2021-12-15: 144 mg via INTRAVENOUS
  Filled 2021-12-15: qty 24

## 2021-12-15 MED ORDER — DIPHENHYDRAMINE HCL 50 MG/ML IJ SOLN
25.0000 mg | Freq: Once | INTRAMUSCULAR | Status: AC
  Administered 2021-12-15: 25 mg via INTRAVENOUS
  Filled 2021-12-15: qty 1

## 2021-12-15 MED ORDER — HEPARIN SOD (PORK) LOCK FLUSH 100 UNIT/ML IV SOLN
500.0000 [IU] | Freq: Once | INTRAVENOUS | Status: AC | PRN
  Administered 2021-12-15: 500 [IU]

## 2021-12-15 NOTE — Patient Instructions (Signed)
[facility]  Discharge Instructions: Thank you for choosing [facility] to provide your oncology and hematology care.   If you have a lab appointment with the [facility], please go directly to the [facility] and check in at the registration area.   Wear comfortable clothing and clothing appropriate for easy access to any Portacath or PICC line.   We strive to give you quality time with your provider. You may need to reschedule your appointment if you arrive late (15 or more minutes).  Arriving late affects you and other patients whose appointments are after yours.  Also, if you miss three or more appointments without notifying the office, you may be dismissed from the clinic at the providers discretion.      For prescription refill requests, have your pharmacy contact our office and allow 72 hours for refills to be completed.    Today you received the following chemotherapy and/or immunotherapy agents: Taxol   To help prevent nausea and vomiting after your treatment, we encourage you to take your nausea medication as directed.  BELOW ARE SYMPTOMS THAT SHOULD BE REPORTED IMMEDIATELY: *FEVER GREATER THAN 100.4 F (38 C) OR HIGHER *CHILLS OR SWEATING *NAUSEA AND VOMITING THAT IS NOT CONTROLLED WITH YOUR NAUSEA MEDICATION *UNUSUAL SHORTNESS OF BREATH *UNUSUAL BRUISING OR BLEEDING *URINARY PROBLEMS (pain or burning when urinating, or frequent urination) *BOWEL PROBLEMS (unusual diarrhea, constipation, pain near the anus) TENDERNESS IN MOUTH AND THROAT WITH OR WITHOUT PRESENCE OF ULCERS (sore throat, sores in mouth, or a toothache) UNUSUAL RASH, SWELLING OR PAIN  UNUSUAL VAGINAL DISCHARGE OR ITCHING   Items with * indicate a potential emergency and should be followed up as soon as possible or go to the Emergency Department if any problems should occur.  Please show the CHEMOTHERAPY ALERT CARD or IMMUNOTHERAPY ALERT CARD at check-in to the  Emergency Department and triage nurse.  Should you have questions after your visit or need to cancel or reschedule your appointment, please contact [facility]  Dept: [phone]  and follow the prompts.  Office hours are 8:00 a.m. to 4:30 p.m. Monday - Friday. Please note that voicemails left after 4:00 p.m. may not be returned until the following business day.  We are closed weekends and major holidays. You have access to a nurse at all times for urgent questions. Please call the main number to the clinic Dept: [phone] and follow the prompts.   For any non-urgent questions, you may also contact your provider using MyChart. We now offer e-Visits for anyone 38 and older to request care online for non-urgent symptoms. For details visit [email or web address].   Also download the MyChart app! Go to the app store, search "MyChart", open the app, select [facility], and log in with your MyChart username and password.  Due to Covid, a mask is required upon entering the hospital/clinic. If you do not have a mask, one will be given to you upon arrival. For doctor visits, patients may have 1 support person aged 73 or older with them. For treatment visits, patients cannot have anyone with them due to current Covid guidelines and our immunocompromised population.

## 2021-12-18 ENCOUNTER — Other Ambulatory Visit: Payer: Self-pay

## 2021-12-18 ENCOUNTER — Ambulatory Visit: Payer: 59

## 2021-12-18 DIAGNOSIS — C50811 Malignant neoplasm of overlapping sites of right female breast: Secondary | ICD-10-CM | POA: Diagnosis not present

## 2021-12-18 DIAGNOSIS — Z170 Estrogen receptor positive status [ER+]: Secondary | ICD-10-CM

## 2021-12-18 DIAGNOSIS — R293 Abnormal posture: Secondary | ICD-10-CM

## 2021-12-18 DIAGNOSIS — M25611 Stiffness of right shoulder, not elsewhere classified: Secondary | ICD-10-CM

## 2021-12-18 NOTE — Therapy (Signed)
[facility] @ [address] [place], [place], [address] Phone: [phone]   Fax:  [phone]  Physical Therapy Treatment  Patient Details  Name: Allison Reed MRN: [id number] Date of Birth: 06-Feb-1976 Referring Provider (PT): Dr. Donne Hazel   Encounter Date: 12/18/2021   PT End of Session - 12/18/21 1737     Visit Number 13    Number of Visits 21    Date for PT Re-Evaluation 01/01/22    PT Start Time 1604    PT Stop Time 1659    PT Time Calculation (min) 55 min    Activity Tolerance Patient tolerated treatment well    Behavior During Therapy [facility] for tasks assessed/performed             Past Medical History:  Diagnosis Date   Diabetes mellitus without complication ([place])    Type 2   Family history of brain tumor    Family history of skin cancer    Gestational diabetes    Headache    otc med prn   Heartburn in pregnancy    Incompetent cervix in pregnancy 04/2015   Missed abortion    no surgery required   Postpartum care following cesarean delivery (11/2) 10/12/2015   right breast ca 06/2021   UTI in pregnancy 03/2015   recent UTI, treatment completed    Past Surgical History:  Procedure Laterality Date   APPENDECTOMY  2006   BREAST RECONSTRUCTION WITH PLACEMENT OF TISSUE EXPANDER AND ALLODERM Right 07/25/2021   Procedure: RIGHT BREAST RECONSTRUCTION WITH PLACEMENT OF TISSUE EXPANDER AND ALLODERM;  Surgeon: Irene Limbo, MD;  Location: [facility];  Service: Plastics;  Laterality: Right;   CERCLAGE LAPAROSCOPIC ABDOMINAL N/A 04/15/2015   Procedure: LAPAROSCOPIC TRANSABDOMINAL CERVCOISTHMIC CERCLAGE;  Surgeon: Governor Specking, MD;  Location: [facility];  Service: Gynecology;  Laterality: N/A;   CERVICAL CERCLAGE  02/2011   vaginal   CESAREAN SECTION N/A 10/12/2015   Procedure: CESAREAN SECTION;  Surgeon: Princess Bruins, MD;  Location: [facility];  Service: Obstetrics;  Laterality: N/A;   CESAREAN  SECTION N/A 03/03/2018   Procedure: Repeat CESAREAN SECTION/Removal of Abdominal Cerclage;  Surgeon: Azucena Fallen, MD;  Location: [facility];  Service: Obstetrics;  Laterality: N/A;  EDD: 03/28/18   MASTECTOMY W/ SENTINEL NODE BIOPSY Right 07/25/2021   Procedure: RIGHT MASTECTOMY WITH AXILLARY SENTINEL LYMPH NODE BIOPSY;  Surgeon: [name], MD;  Location: [facility];  Service: General;  Laterality: Right;   PORTACATH PLACEMENT N/A 08/24/2021   Procedure: INSERTION PORT-A-CATH;  Surgeon: [name], MD;  Location: [facility];  Service: General;  Laterality: N/A;   RADIOACTIVE SEED GUIDED AXILLARY SENTINEL LYMPH NODE Right 07/25/2021   Procedure: RADIOACTIVE SEED GUIDED RIGHT AXILLARY NODE EXCISION;  Surgeon: [name], MD;  Location: [facility];  Service: General;  Laterality: Right;   TISSUE EXPANDER PLACEMENT Right 12/04/2021   Procedure: REMOVAL RIGHT CHEST TISSUE EXPANDER;  Surgeon: Irene Limbo, MD;  Location: [facility];  Service: Plastics;  Laterality: Right;   WISDOM TOOTH EXTRACTION  2001    There were no vitals filed for this visit.   Subjective Assessment - 12/18/21 1405     Subjective I haven't gotten to call about compression yet but I will call maybe tomorrow. The cord still feels a little better from where she popped it last time but I know the area still needs work.    Pertinent History She is a wife of  a pulmonary critical care physician . She noted a palpable right breast mass. This is followed by a mammogram with a mass with calcifications as really throughout the upper outer quadrant and in the central breast. This measures 9.4 x 8.7 x 6.7 cm. On ultrasound she had 3 separate masses at 10, 11, and 12:00. The sizes are 1.7, 2.1, and 2.2 cm. There is also a single abnormal axillary lymph node that has undergone biopsy. 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 She is s/p right breast mastectomy with SLNB 0/7 LN on August 16 andl also had tissue expanders in place.  She is currently undergoing chemotherapy for 6 months every other week and radiation will follow; emergency sugery to remove expander 12/04/21    Patient Stated Goals Reassess after surgery    Currently in Pain? No/denies                               [facility] Adult PT Treatment/Exercise - 12/18/21 0001       Manual Therapy   Manual Therapy Soft tissue mobilization;Myofascial release;Passive ROM    Soft tissue mobilization To Rt pectoralis insertion during other manual therapies; then briefly at end of session with cocoa butter to her Rt upper trap where trigger points palpable    Myofascial Release MFR to right medial arm, axilla, and forearm area of cording.    Passive ROM In supine into Rt shoulder flexion, abduction and D2 flexion ; this was much improved today and pt with less resistance at end motions                          PT Long Term Goals - 11/20/21 1618       PT LONG TERM GOAL #1   Title Pt will achieve full right shoulder ROM post surgery for full return to daily activities    Time 6    Period Weeks    Status On-going    Target Date 01/01/22      PT LONG TERM GOAL #2   Title Pt will improve right shoulder flexion and scaption by atleast 15 degrees    Baseline regressed today secondary to cording    Time 6    Period Weeks    Status On-going    Target Date 01/01/22      PT LONG TERM GOAL #3   Title Pt will report decrease achiness and pulling from cording by 50% or more    Time 6    Period Weeks    Status On-going    Target Date 01/01/22      PT LONG  TERM GOAL #4   Title Pt will be able to perform arm movements to continue with her indian dancing    Baseline was able to perform but too fatigued from chemo. now cording limits    Time 6    Period Weeks    Status On-going    Target Date 01/01/22                   Plan - 12/18/21 1738  Clinical Impression Statement Pt much improved from last session. Cording still present but less so and less limiting. Able to achieve near full P/ROM of Rt shoulder and was able to stretch cording more aggressively today with less reports of discomfort from pt. Overall she seems much improved from when this therapist saw her last.    Personal Factors and Comorbidities Comorbidity 1    Comorbidities right breast cancer s/p mastectomy; presently having chemo and pending radiation; recent emergency surgery to remove expander    Stability/Clinical Decision Making Stable/Uncomplicated    Rehab Potential Excellent    PT Frequency 2x / week    PT Duration 6 weeks    PT Treatment/Interventions ADLs/Self Care Home Management;Therapeutic exercise;Manual techniques;Patient/family education;Manual lymph drainage;Scar mobilization;Passive range of motion;Moist Heat    PT Next Visit Plan MFR for new cording, PROM, STM to tight areas of upper quadrant prn, Strength ABC. did pt call insurance company about covering compression    PT Home Exercise Plan 4 post op exs , supine wand flexion and scaption; modified downward dog on wall, supine scapular series    Consulted and Agree with Plan of Care Patient             Patient will benefit from skilled therapeutic intervention in order to improve the following deficits and impairments:  Postural dysfunction, Decreased knowledge of precautions, Decreased scar mobility, Increased fascial restricitons, Decreased range of motion  Visit Diagnosis: Malignant neoplasm of overlapping sites of right breast in female, estrogen receptor positive ([place])  Stiffness of  right shoulder, not elsewhere classified  Abnormal posture     Problem List Patient Active Problem List   Diagnosis Date Noted   Breast cancer, right ([place]) 12/04/2021   Port-A-Cath in place 08/25/2021   S/P mastectomy, right 07/25/2021   Genetic testing 07/01/2021   Cancer ([place]) 06/26/2021   Family history of skin cancer 06/22/2021   Family history of brain tumor 06/22/2021   Malignant neoplasm of overlapping sites of right breast in female, estrogen receptor positive ([place]) 06/21/2021   Premature uterine contractions causing threatened premature labor in third trimester 03/03/2018   Previous cesarean delivery, delivered 03/03/2018   Postpartum care following cesarean delivery (3/25) 03/03/2018   Status post repeat low transverse cesarean section 03/03/2018   DM (diabetes mellitus) in pregnancy 03/02/2018   Incompetent cervix in pregnancy 03/02/2018   Premature uterine contractions in third trimester, antepartum 03/02/2018   Active labor 10/12/2015    Otelia Limes, PTA 12/18/2021, 5:41 PM  [facility] @ [place], [place], [address] Phone: [phone]   Fax:  [phone]  Name: CAMI DELAWDER MRN: [id number] Date of Birth: [birth year]

## 2021-12-20 ENCOUNTER — Ambulatory Visit: Payer: 59

## 2021-12-20 ENCOUNTER — Other Ambulatory Visit: Payer: Self-pay

## 2021-12-20 DIAGNOSIS — C50811 Malignant neoplasm of overlapping sites of right female breast: Secondary | ICD-10-CM | POA: Diagnosis not present

## 2021-12-20 DIAGNOSIS — R293 Abnormal posture: Secondary | ICD-10-CM

## 2021-12-20 DIAGNOSIS — Z170 Estrogen receptor positive status [ER+]: Secondary | ICD-10-CM

## 2021-12-20 DIAGNOSIS — M25611 Stiffness of right shoulder, not elsewhere classified: Secondary | ICD-10-CM

## 2021-12-20 NOTE — Therapy (Signed)
[facility] @ [address] [place], [place], [address] Phone: [phone]   Fax:  [phone]  Physical Therapy Treatment  Patient Details  Name: Allison Reed MRN: [id number] Date of Birth: [birth year] Referring Provider (PT): Dr. Donne Hazel   Encounter Date: 12/20/2021   PT End of Session - 12/20/21 0901     Visit Number 14    Number of Visits 21    Date for PT Re-Evaluation 01/01/22    PT Start Time 0804    PT Stop Time 4034    PT Time Calculation (min) 53 min    Activity Tolerance Patient tolerated treatment well    Behavior During Therapy [facility] for tasks assessed/performed             Past Medical History:  Diagnosis Date   Diabetes mellitus without complication ([place])    Type 2   Family history of brain tumor    Family history of skin cancer    Gestational diabetes    Headache    otc med prn   Heartburn in pregnancy    Incompetent cervix in pregnancy 04/2015   Missed abortion    no surgery required   Postpartum care following cesarean delivery (11/2) 10/12/2015   right breast ca 06/2021   UTI in pregnancy 03/2015   recent UTI, treatment completed    Past Surgical History:  Procedure Laterality Date   APPENDECTOMY  2006   BREAST RECONSTRUCTION WITH PLACEMENT OF TISSUE EXPANDER AND ALLODERM Right 07/25/2021   Procedure: RIGHT BREAST RECONSTRUCTION WITH PLACEMENT OF TISSUE EXPANDER AND ALLODERM;  Surgeon: Irene Limbo, MD;  Location: [facility];  Service: Plastics;  Laterality: Right;   CERCLAGE LAPAROSCOPIC ABDOMINAL N/A 04/15/2015   Procedure: LAPAROSCOPIC TRANSABDOMINAL CERVCOISTHMIC CERCLAGE;  Surgeon: Governor Specking, MD;  Location: [facility];  Service: Gynecology;  Laterality: N/A;   CERVICAL CERCLAGE  02/2011   vaginal   CESAREAN SECTION N/A 10/12/2015   Procedure: CESAREAN SECTION;  Surgeon: Princess Bruins, MD;  Location: [facility];  Service: Obstetrics;  Laterality: N/A;   CESAREAN  SECTION N/A 03/03/2018   Procedure: Repeat CESAREAN SECTION/Removal of Abdominal Cerclage;  Surgeon: Azucena Fallen, MD;  Location: [facility];  Service: Obstetrics;  Laterality: N/A;  EDD: 03/28/18   MASTECTOMY W/ SENTINEL NODE BIOPSY Right 07/25/2021   Procedure: RIGHT MASTECTOMY WITH AXILLARY SENTINEL LYMPH NODE BIOPSY;  Surgeon: [name], MD;  Location: [facility];  Service: General;  Laterality: Right;   PORTACATH PLACEMENT N/A 08/24/2021   Procedure: INSERTION PORT-A-CATH;  Surgeon: [name], MD;  Location: [facility];  Service: General;  Laterality: N/A;   RADIOACTIVE SEED GUIDED AXILLARY SENTINEL LYMPH NODE Right 07/25/2021   Procedure: RADIOACTIVE SEED GUIDED RIGHT AXILLARY NODE EXCISION;  Surgeon: [name], MD;  Location: [facility];  Service: General;  Laterality: Right;   TISSUE EXPANDER PLACEMENT Right 12/04/2021   Procedure: REMOVAL RIGHT CHEST TISSUE EXPANDER;  Surgeon: Irene Limbo, MD;  Location: [facility];  Service: Plastics;  Laterality: Right;   WISDOM TOOTH EXTRACTION  2001    There were no vitals filed for this visit.   Subjective Assessment - 12/20/21 0808     Subjective My forearm has started feeling sore today and with writing I'm feeling my muscle quiver around my Rt scapula.    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 She noted a palpable right breast mass. This is followed by  a mammogram with a mass with calcifications as really throughout the upper outer quadrant and in the central breast. This measures 9.4 x 8.7 x 6.7 cm. On ultrasound she had 3 separate masses at 10, 11, and 12:00. The sizes are 1.7, 2.1, and 2.2 cm. There is also a single abnormal axillary lymph node that has undergone biopsy. She also has undergone an MRI that shows extensive right breast disease with both mass and non-mass enhancement involving the majority of the superior and upper outer  right breast with relative sparing of the lower inner quadrant.Biopsy of the right breast lesions or a grade 2 invasive ductal carcinoma that is very ER, PR positive, HER2 negative, and Ki-67 is 25%. She is s/p right breast mastectomy with SLNB 0/7 LN on August 16 andl also had tissue expanders in place.  She is currently undergoing chemotherapy for 6 months every other week and radiation will follow; emergency sugery to remove expander 12/04/21    Patient Stated Goals Reassess after surgery    Currently in Pain? No/denies                               [facility] Adult PT Treatment/Exercise - 12/20/21 0001       Manual Therapy   Manual Therapy Soft tissue mobilization;Myofascial release;Passive ROM    Soft tissue mobilization To Rt UE with cocoa butter (after MFR) along entire UE at area of cording; in Lt S/L to Rt medial scapula border and at most proximal aspect of med scap where trigger point palpable and where pt reports muscle quivering when writing; then briefly at end of session with cocoa butter to her Rt upper trap where trigger points palpable    Myofascial Release MFR to right medial arm, axilla, and forearm area of cording.    Scapular Mobilization In Lt S/L into retraction/protraction and then scapular depression throughout P/ROM, pt was very tight with this so included the STM to UT area    Passive ROM In supine into Rt shoulder flexion, abduction and D2 flexion ; this was much improved today and pt with less resistance at end motions                          PT Long Term Goals - 11/20/21 1618       PT LONG TERM GOAL #1   Title Pt will achieve full right shoulder ROM post surgery for full return to daily activities    Time 6    Period Weeks    Status On-going    Target Date 01/01/22      PT LONG TERM GOAL #2   Title Pt will improve right shoulder flexion and scaption by atleast 15 degrees    Baseline regressed today secondary to cording     Time 6    Period Weeks    Status On-going    Target Date 01/01/22      PT LONG TERM GOAL #3   Title Pt will report decrease achiness and pulling from cording by 50% or more    Time 6    Period Weeks    Status On-going    Target Date 01/01/22      PT LONG TERM GOAL #4   Title Pt will be able to perform arm movements to continue with her indian dancing    Baseline was able to perform but too fatigued from chemo.  now cording limits    Time 6    Period Weeks    Status On-going    Target Date 01/01/22                   Plan - 12/20/21 0901     Clinical Impression Statement Continued with manual therapy working to decrease symptoms of cording. Also included scapular mobs and trigger point release at same where pt has been reporting feeling muscle quiver after about anhour of writing. Also encouraged her to be mindful of her posture during these times of prolonged sitting when she is working and to resume scapular series that we had put on hold after her emergency surgery to remove expander. She is to resume with yellow theraband and only 5 reps about every other day and progress from there. Pt able to verbalize good understanding.    Personal Factors and Comorbidities Comorbidity 1    Comorbidities right breast cancer s/p mastectomy; presently having chemo and pending radiation; recent emergency surgery to remove expander    Stability/Clinical Decision Making Stable/Uncomplicated    Rehab Potential Excellent    PT Frequency 2x / week    PT Duration 6 weeks    PT Treatment/Interventions ADLs/Self Care Home Management;Therapeutic exercise;Manual techniques;Patient/family education;Manual lymph drainage;Scar mobilization;Passive range of motion;Moist Heat    PT Next Visit Plan MFR for new cording, PROM, STM to tight areas of upper quadrant prn, Strength ABC. did pt call insurance company about covering compression    PT Home Exercise Plan 4 post op exs , supine wand flexion and  scaption; modified downward dog on wall, supine scapular series    Consulted and Agree with Plan of Care Patient             Patient will benefit from skilled therapeutic intervention in order to improve the following deficits and impairments:  Postural dysfunction, Decreased knowledge of precautions, Decreased scar mobility, Increased fascial restricitons, Decreased range of motion  Visit Diagnosis: Malignant neoplasm of overlapping sites of right breast in female, estrogen receptor positive ([place])  Stiffness of right shoulder, not elsewhere classified  Abnormal posture     Problem List Patient Active Problem List   Diagnosis Date Noted   Breast cancer, right ([place]) 12/04/2021   Port-A-Cath in place 08/25/2021   S/P mastectomy, right 07/25/2021   Genetic testing 07/01/2021   Cancer ([place]) 06/26/2021   Family history of skin cancer 06/22/2021   Family history of brain tumor 06/22/2021   Malignant neoplasm of overlapping sites of right breast in female, estrogen receptor positive ([place]) 06/21/2021   Premature uterine contractions causing threatened premature labor in third trimester 03/03/2018   Previous cesarean delivery, delivered 03/03/2018   Postpartum care following cesarean delivery (3/25) 03/03/2018   Status post repeat low transverse cesarean section 03/03/2018   DM (diabetes mellitus) in pregnancy 03/02/2018   Incompetent cervix in pregnancy 03/02/2018   Premature uterine contractions in third trimester, antepartum 03/02/2018   Active labor 10/12/2015    Otelia Limes, PTA 12/20/2021, 9:04 AM  [facility] @ [place], [place], [address] Phone: [phone]   Fax:  [phone]  Name: FRAIDY MCCARRICK MRN: [id number] Date of Birth: 02-Feb-1976

## 2021-12-21 NOTE — Assessment & Plan Note (Signed)
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, margins negative, 4/7 lymph nodes, ER 75 to 95%, PR 9095%, HER2 negative, Ki-67 25% Genetics: Negative MammaPrint: Low risk (this is not a valid test for 4+ lymph nodes)  Treatment plan: 1.Adjuvant chemotherapy with dose dense Adriamycin and Cytoxan x4 followed by Taxol weekly x12started 08/25/2021 2.adjuvant radiation therapy 3.Followed by adjuvant antiestrogen therapy with complete estrogen blockade and abemaciclib ------------------------------------------------------------------------------------------------------------------------ Current treatment:Completed 4 cycles ofdose dense Adriamycin and Cytoxan, today cycle8Taxol 08/18/2021: Echocardiogram: EF 55 to 60%  Chemo toxicities: 1.Mild fatigue She denies any nausea or vomiting. She is using compression stockings as a form of prevention of neuropathy.  Skin itching:Eczema on both her thighs: Resolved Diabetes: Appears to be under reasonable control.. Repeat prognostic panel on the lymph node in the final pathology also came back as ER/PR positive  She wants to be off chemo for Christmas.  She will receive her next treatment on 12/08/2021 Return to clinicon 12/08/2021 for follow-up

## 2021-12-22 ENCOUNTER — Encounter: Payer: Self-pay | Admitting: *Deleted

## 2021-12-22 ENCOUNTER — Inpatient Hospital Stay (HOSPITAL_BASED_OUTPATIENT_CLINIC_OR_DEPARTMENT_OTHER): Payer: 59 | Admitting: Hematology and Oncology

## 2021-12-22 ENCOUNTER — Other Ambulatory Visit: Payer: Self-pay

## 2021-12-22 ENCOUNTER — Inpatient Hospital Stay: Payer: 59

## 2021-12-22 DIAGNOSIS — Z170 Estrogen receptor positive status [ER+]: Secondary | ICD-10-CM

## 2021-12-22 DIAGNOSIS — C50811 Malignant neoplasm of overlapping sites of right female breast: Secondary | ICD-10-CM

## 2021-12-22 DIAGNOSIS — Z95828 Presence of other vascular implants and grafts: Secondary | ICD-10-CM

## 2021-12-22 LAB — CMP (CANCER CENTER ONLY)
ALT: 22 U/L (ref 0–44)
AST: 17 U/L (ref 15–41)
Albumin: 4.1 g/dL (ref 3.5–5.0)
Alkaline Phosphatase: 55 U/L (ref 38–126)
Anion gap: 9 (ref 5–15)
BUN: 9 mg/dL (ref 6–20)
CO2: 25 mmol/L (ref 22–32)
Calcium: 9.4 mg/dL (ref 8.9–10.3)
Chloride: 103 mmol/L (ref 98–111)
Creatinine: 0.48 mg/dL (ref 0.44–1.00)
GFR, Estimated: >60 mL/min (ref >60)
Glucose, Bld: 163 mg/dL — ABNORMAL HIGH (ref 70–99)
Potassium: 3.7 mmol/L (ref 3.5–5.1)
Sodium: 137 mmol/L (ref 135–145)
Total Bilirubin: 0.5 mg/dL (ref 0.3–1.2)
Total Protein: 6.7 g/dL (ref 6.5–8.1)

## 2021-12-22 LAB — CBC WITH DIFFERENTIAL (CANCER CENTER ONLY)
Abs Immature Granulocytes: 0.02 10*3/uL (ref 0.00–0.07)
Basophils Absolute: 0 10*3/uL (ref 0.0–0.1)
Basophils Relative: 1 %
Eosinophils Absolute: 0.3 10*3/uL (ref 0.0–0.5)
Eosinophils Relative: 6 %
HCT: 33.1 % — ABNORMAL LOW (ref 36.0–46.0)
Hemoglobin: 10.6 g/dL — ABNORMAL LOW (ref 12.0–15.0)
Immature Granulocytes: 0 %
Lymphocytes Relative: 30 %
Lymphs Abs: 1.4 10*3/uL (ref 0.7–4.0)
MCH: 25.5 pg — ABNORMAL LOW (ref 26.0–34.0)
MCHC: 32 g/dL (ref 30.0–36.0)
MCV: 79.8 fL — ABNORMAL LOW (ref 80.0–100.0)
Monocytes Absolute: 0.3 10*3/uL (ref 0.1–1.0)
Monocytes Relative: 7 %
Neutro Abs: 2.7 10*3/uL (ref 1.7–7.7)
Neutrophils Relative %: 56 %
Platelet Count: 278 10*3/uL (ref 150–400)
RBC: 4.15 MIL/uL (ref 3.87–5.11)
RDW: 15.7 % — ABNORMAL HIGH (ref 11.5–15.5)
WBC Count: 4.8 10*3/uL (ref 4.0–10.5)
nRBC: 0 % (ref 0.0–0.2)

## 2021-12-22 MED ORDER — SODIUM CHLORIDE 0.9 % IV SOLN
80.0000 mg/m2 | Freq: Once | INTRAVENOUS | Status: AC
  Administered 2021-12-22: 144 mg via INTRAVENOUS
  Filled 2021-12-22: qty 24

## 2021-12-22 MED ORDER — DIPHENHYDRAMINE HCL 50 MG/ML IJ SOLN
25.0000 mg | Freq: Once | INTRAMUSCULAR | Status: AC
  Administered 2021-12-22: 25 mg via INTRAVENOUS
  Filled 2021-12-22: qty 1

## 2021-12-22 MED ORDER — DEXAMETHASONE SODIUM PHOSPHATE 10 MG/ML IJ SOLN
4.0000 mg | Freq: Once | INTRAMUSCULAR | Status: AC
  Administered 2021-12-22: 4 mg via INTRAVENOUS
  Filled 2021-12-22: qty 1

## 2021-12-22 MED ORDER — SODIUM CHLORIDE 0.9 % IV SOLN: Freq: Once | INTRAVENOUS | Status: AC

## 2021-12-22 MED ORDER — SODIUM CHLORIDE 0.9% FLUSH
10.0000 mL | Freq: Once | INTRAVENOUS | Status: AC
  Administered 2021-12-22: 10 mL

## 2021-12-22 MED ORDER — FAMOTIDINE 20 MG IN NS 100 ML IVPB
20.0000 mg | Freq: Once | INTRAVENOUS | Status: AC
  Administered 2021-12-22: 20 mg via INTRAVENOUS
  Filled 2021-12-22: qty 100

## 2021-12-22 NOTE — Progress Notes (Signed)
Patient Care Team: [name], MD as PCP - General (Endocrinology) Mauro Kaufmann, RN as Oncology Nurse Navigator Rockwell Germany, RN as Oncology Nurse Navigator  DIAGNOSIS:  Encounter Diagnosis  Name Primary?   Malignant neoplasm of overlapping sites of right breast in female, estrogen receptor positive ([place])     SUMMARY OF ONCOLOGIC HISTORY: Oncology History Overview Note  Ductal carcinoma in situ of the right breast  She palpated a lump in her right breast. Diagnostic mammogram and [place] on 06/08/21 showed irregular hypoechoic lesions in the right breast with a single abnormal right axillary lymph node without normal cortex. Biopsy on 06/16/21 showed high grade DCIS with calcifications and necrosis.   Malignant neoplasm of overlapping sites of right breast in female, estrogen receptor positive ([place])  06/16/2021 Initial Diagnosis   Palpable right breast mass: Breast MRI revealed extensive involvement of the right breast with mass and non-mass enhancement superior right breast mass measures 4.9 x 1.2 cm and together with non-mass enhancement measured 8.8 cm, enhancing mass LOQ 1.3 cm, left breast indeterminate 0.9 cm mass and a 0.8 cm mass UOQ, single abnormal lymph node Biopsy 10:00, 11:00 and 12:00: IDC with DCIS, biopsy right axillary lymph node: IDC, ER 75 to 95%, PR 90 to 95%, Ki-67 25%, HER2 negative on 1 biopsy and 2+ by IHC and FISH pending   06/21/2021 Cancer Staging   Staging form: Breast, AJCC 8th Edition - Clinical stage from 06/21/2021: Stage IIA (cT3, cN1, cM0, G2, ER+, PR+, HER2-) - Signed by [name], MD on 06/21/2021 Histologic grading system: 3 grade system    07/01/2021 Genetic Testing   Negative genetic testing:  No pathogenic variants detected on the [facility] BRCAplus panel (report date 07/01/2021) or the [facility] CancerNext-Expanded + RNAinsight panel (report date 07/01/2021).   The BRCAplus panel offered by [employer] and includes sequencing and  deletion/duplication analysis for the following 8 genes: ATM, BRCA1, BRCA2, CDH1, CHEK2, PALB2, PTEN, and TP53. The CancerNext-Expanded + RNAinsight gene panel offered by [employer] and includes sequencing and rearrangement analysis for the following 77 genes: AIP, ALK, APC, ATM, AXIN2, BAP1, BARD1, BLM, BMPR1A, BRCA1, BRCA2, BRIP1, CDC73, CDH1, CDK4, CDKN1B, CDKN2A, CHEK2, CTNNA1, DICER1, FANCC, FH, FLCN, GALNT12, KIF1B, LZTR1, MAX, MEN1, MET, MLH1, MSH2, MSH3, MSH6, MUTYH, NBN, NF1, NF2, NTHL1, PALB2, PHOX2B, PMS2, POT1, PRKAR1A, PTCH1, PTEN, RAD51C, RAD51D, RB1, RECQL, RET, SDHA, SDHAF2, SDHB, SDHC, SDHD, SMAD4, SMARCA4, SMARCB1, SMARCE1, STK11, SUFU, TMEM127, TP53, TSC1, TSC2, VHL and XRCC2 (sequencing and deletion/duplication); EGFR, EGLN1, HOXB13, KIT, MITF, PDGFRA, POLD1 and POLE (sequencing only); EPCAM and GREM1 (deletion/duplication only). RNA data is routinely analyzed for use in variant interpretation for all genes.   07/25/2021 Surgery   Right mastectomy: Foci of invasive ductal carcinoma measuring 1.1 cm grade 2 with extensive DCIS spanning 8.9 cm, margins negative, 4/7 lymph nodes, ER 75 to 95%, PR 9095%, HER2 negative, Ki-67 25%   08/25/2021 -  Chemotherapy   Patient is on Treatment Plan : BREAST ADJUVANT DOSE DENSE AC q14d / PACLitaxel q7d       CHIEF COMPLIANT: Cycle 8 Taxol  INTERVAL HISTORY: Allison Reed is a 46 year old with above-mentioned history of right breast cancer treated with mastectomies currently on adjuvant chemotherapy.  She was on adjuvant Taxol when she developed an infection that required surgery to remove the expander.  She was on antibiotics and we had to hold her chemo for 2 weeks.  She restarted it last week and today she is receiving her eighth  round of treatment.  She has noticed a slight numbness of the tips of her great toe intermittently.  She is tired of coming through for these chemotherapy treatments and cannot wait to be done.  Does not have any nausea  or vomiting.  She is currently working full-time and appears to be enjoying that as a good distraction.   ALLERGIES:  is allergic to betadine [povidone iodine] and tape.  MEDICATIONS:  Current Outpatient Medications  Medication Sig Dispense Refill   Empagliflozin-metFORMIN HCl ER (SYNJARDY XR) 09-999 MG TB24 Take by mouth.     lidocaine-prilocaine (EMLA) cream Apply to affected area once 30 g 3   sitaGLIPtin (JANUVIA) 100 MG tablet Take 100 mg by mouth daily.     No current facility-administered medications for this visit.    PHYSICAL EXAMINATION: ECOG PERFORMANCE STATUS: 1 - Symptomatic but completely ambulatory  Vitals:   12/22/21 1006  BP: 114/71  Pulse: (!) 102  Resp: 18  Temp: 98.8 F (37.1 C)  SpO2: 100%   Filed Weights   12/22/21 1006  Weight: 155 lb 12.8 oz (70.7 kg)      LABORATORY DATA:  I have reviewed the data as listed CMP Latest Ref Rng & Units 12/22/2021 12/15/2021 11/24/2021  Glucose 70 - 99 mg/dL 163(H) 199(H) 296(H)  BUN 6 - 20 mg/dL $Remove'9 8 9  'UWzcSqb$ Creatinine 0.44 - 1.00 mg/dL 0.48 0.58 0.71  Sodium 135 - 145 mmol/L 137 137 136  Potassium 3.5 - 5.1 mmol/L 3.7 4.2 3.9  Chloride 98 - 111 mmol/L 103 104 102  CO2 22 - 32 mmol/L $RemoveB'25 23 22  'lbBncwUu$ Calcium 8.9 - 10.3 mg/dL 9.4 9.1 9.3  Total Protein 6.5 - 8.1 g/dL 6.7 6.9 6.7  Total Bilirubin 0.3 - 1.2 mg/dL 0.5 0.4 0.5  Alkaline Phos 38 - 126 U/L 55 62 59  AST 15 - 41 U/L $Remo'17 19 18  'XCexx$ ALT 0 - 44 U/L $Remo'22 23 27    'Ruude$ Lab Results  Component Value Date   WBC 4.8 12/22/2021   HGB 10.6 (L) 12/22/2021   HCT 33.1 (L) 12/22/2021   MCV 79.8 (L) 12/22/2021   PLT 278 12/22/2021   NEUTROABS 2.7 12/22/2021    ASSESSMENT & PLAN:  Malignant neoplasm of overlapping sites of right breast in female, estrogen receptor positive ([place]) 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, margins negative, 4/7 lymph nodes, ER 75 to 95%, PR 9095%, HER2 negative, Ki-67  25% Genetics: Negative MammaPrint: Low risk (this is not a valid test for 4+ lymph nodes)   Treatment plan: 1.  Adjuvant chemotherapy with dose dense Adriamycin and Cytoxan x4 followed by Taxol weekly x12 started 08/25/2021 2. adjuvant radiation therapy 3.  Followed by adjuvant antiestrogen therapy with complete estrogen blockade and abemaciclib ------------------------------------------------------------------------------------------------------------------------ Current treatment: Completed 4 cycles of dose dense Adriamycin and Cytoxan, today cycle 8 Taxol 08/18/2021: Echocardiogram: EF 55 to 60%   Chemo toxicities: 1.  Mild fatigue 2. 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  She might require dose reduction if it continues to persist.  She denies any nausea or vomiting. She is using compression stockings as a form of prevention of neuropathy.      Diabetes: Appears to be under reasonable control.. Repeat prognostic panel on the lymph node in the final pathology also came back as ER/PR positive Breast expander removal surgery: Patient might consider doing a DIEP  flap in the future.  Return to clinic weekly for Taxol and every other week for follow-up with me.     No orders of the defined types were placed in this encounter.  The patient has a good understanding of the overall plan. she agrees with it. she will call with any problems that may develop before the next visit here. Total time spent: 30 mins including face to face time and time spent for planning, charting and co-ordination of care   Harriette Ohara, MD 12/22/21

## 2021-12-22 NOTE — Patient Instructions (Signed)
[facility]  Discharge Instructions: Thank you for choosing [facility] to provide your oncology and hematology care.   If you have a lab appointment with the [facility], please go directly to the [facility] and check in at the registration area.   Wear comfortable clothing and clothing appropriate for easy access to any Portacath or PICC line.   We strive to give you quality time with your provider. You may need to reschedule your appointment if you arrive late (15 or more minutes).  Arriving late affects you and other patients whose appointments are after yours.  Also, if you miss three or more appointments without notifying the office, you may be dismissed from the clinic at the providers discretion.      For prescription refill requests, have your pharmacy contact our office and allow 72 hours for refills to be completed.    Today you received the following chemotherapy and/or immunotherapy agents: Taxol   To help prevent nausea and vomiting after your treatment, we encourage you to take your nausea medication as directed.  BELOW ARE SYMPTOMS THAT SHOULD BE REPORTED IMMEDIATELY: *FEVER GREATER THAN 100.4 F (38 C) OR HIGHER *CHILLS OR SWEATING *NAUSEA AND VOMITING THAT IS NOT CONTROLLED WITH YOUR NAUSEA MEDICATION *UNUSUAL SHORTNESS OF BREATH *UNUSUAL BRUISING OR BLEEDING *URINARY PROBLEMS (pain or burning when urinating, or frequent urination) *BOWEL PROBLEMS (unusual diarrhea, constipation, pain near the anus) TENDERNESS IN MOUTH AND THROAT WITH OR WITHOUT PRESENCE OF ULCERS (sore throat, sores in mouth, or a toothache) UNUSUAL RASH, SWELLING OR PAIN  UNUSUAL VAGINAL DISCHARGE OR ITCHING   Items with * indicate a potential emergency and should be followed up as soon as possible or go to the Emergency Department if any problems should occur.  Please show the CHEMOTHERAPY ALERT CARD or IMMUNOTHERAPY ALERT CARD at check-in to the  Emergency Department and triage nurse.  Should you have questions after your visit or need to cancel or reschedule your appointment, please contact [facility]  Dept: [phone]  and follow the prompts.  Office hours are 8:00 a.m. to 4:30 p.m. Monday - Friday. Please note that voicemails left after 4:00 p.m. may not be returned until the following business day.  We are closed weekends and major holidays. You have access to a nurse at all times for urgent questions. Please call the main number to the clinic Dept: [phone] and follow the prompts.   For any non-urgent questions, you may also contact your provider using MyChart. We now offer e-Visits for anyone 67 and older to request care online for non-urgent symptoms. For details visit [email or web address].   Also download the MyChart app! Go to the app store, search "MyChart", open the app, select [facility], and log in with your MyChart username and password.  Due to Covid, a mask is required upon entering the hospital/clinic. If you do not have a mask, one will be given to you upon arrival. For doctor visits, patients may have 1 support person aged 29 or older with them. For treatment visits, patients cannot have anyone with them due to current Covid guidelines and our immunocompromised population.

## 2021-12-26 ENCOUNTER — Ambulatory Visit: Payer: 59

## 2021-12-26 ENCOUNTER — Other Ambulatory Visit: Payer: Self-pay

## 2021-12-26 DIAGNOSIS — M25611 Stiffness of right shoulder, not elsewhere classified: Secondary | ICD-10-CM

## 2021-12-26 DIAGNOSIS — C50811 Malignant neoplasm of overlapping sites of right female breast: Secondary | ICD-10-CM

## 2021-12-26 DIAGNOSIS — Z170 Estrogen receptor positive status [ER+]: Secondary | ICD-10-CM

## 2021-12-26 DIAGNOSIS — R293 Abnormal posture: Secondary | ICD-10-CM

## 2021-12-26 NOTE — Therapy (Signed)
[facility] @ [address] [place], [place], [address] Phone: [phone]   Fax:  [phone]  Physical Therapy Treatment  Patient Details  Name: Allison Reed MRN: [id number] Date of Birth: 25-Nov-1976 Referring Provider (PT): Dr. Donne Hazel   Encounter Date: 12/26/2021   PT End of Session - 12/26/21 0853     Visit Number 15    Number of Visits 21    Date for PT Re-Evaluation 01/01/22    PT Start Time 0802    PT Stop Time 6720   pt had to leave 10 mins early   PT Time Calculation (min) 49 min    Activity Tolerance Patient tolerated treatment well    Behavior During Therapy [facility] for tasks assessed/performed             Past Medical History:  Diagnosis Date   Diabetes mellitus without complication ([place])    Type 2   Family history of brain tumor    Family history of skin cancer    Gestational diabetes    Headache    otc med prn   Heartburn in pregnancy    Incompetent cervix in pregnancy 04/2015   Missed abortion    no surgery required   Postpartum care following cesarean delivery (11/2) 10/12/2015   right breast ca 06/2021   UTI in pregnancy 03/2015   recent UTI, treatment completed    Past Surgical History:  Procedure Laterality Date   APPENDECTOMY  2006   BREAST RECONSTRUCTION WITH PLACEMENT OF TISSUE EXPANDER AND ALLODERM Right 07/25/2021   Procedure: RIGHT BREAST RECONSTRUCTION WITH PLACEMENT OF TISSUE EXPANDER AND ALLODERM;  Surgeon: Irene Limbo, MD;  Location: [facility];  Service: Plastics;  Laterality: Right;   CERCLAGE LAPAROSCOPIC ABDOMINAL N/A 04/15/2015   Procedure: LAPAROSCOPIC TRANSABDOMINAL CERVCOISTHMIC CERCLAGE;  Surgeon: Governor Specking, MD;  Location: [facility];  Service: Gynecology;  Laterality: N/A;   CERVICAL CERCLAGE  02/2011   vaginal   CESAREAN SECTION N/A 10/12/2015   Procedure: CESAREAN SECTION;  Surgeon: Princess Bruins, MD;  Location: [facility];  Service: Obstetrics;   Laterality: N/A;   CESAREAN SECTION N/A 03/03/2018   Procedure: Repeat CESAREAN SECTION/Removal of Abdominal Cerclage;  Surgeon: Azucena Fallen, MD;  Location: [facility];  Service: Obstetrics;  Laterality: N/A;  EDD: 03/28/18   MASTECTOMY W/ SENTINEL NODE BIOPSY Right 07/25/2021   Procedure: RIGHT MASTECTOMY WITH AXILLARY SENTINEL LYMPH NODE BIOPSY;  Surgeon: [name], MD;  Location: [facility];  Service: General;  Laterality: Right;   PORTACATH PLACEMENT N/A 08/24/2021   Procedure: INSERTION PORT-A-CATH;  Surgeon: [name], MD;  Location: [facility];  Service: General;  Laterality: N/A;   RADIOACTIVE SEED GUIDED AXILLARY SENTINEL LYMPH NODE Right 07/25/2021   Procedure: RADIOACTIVE SEED GUIDED RIGHT AXILLARY NODE EXCISION;  Surgeon: [name], MD;  Location: [facility];  Service: General;  Laterality: Right;   TISSUE EXPANDER PLACEMENT Right 12/04/2021   Procedure: REMOVAL RIGHT CHEST TISSUE EXPANDER;  Surgeon: Irene Limbo, MD;  Location: [facility];  Service: Plastics;  Laterality: Right;   WISDOM TOOTH EXTRACTION  2001    There were no vitals filed for this visit.   Subjective Assessment - 12/26/21 0807     Subjective I need to leave 10 mins early for a work meeting. The forearm is still feeling the most sore and I think working more lately has been proonging this. I resumed the yellow theraband exercises and am a  little sore from that. And I called my insurance company to ask about coverage for a compression sleeve and they said their was a company in [facility]! I got frustrated and hung up but I will try [facility] and see if she can help me since I am out of network with [employer].    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 She noted a palpable right breast mass. This is followed by a mammogram with a mass with calcifications as really throughout the upper outer quadrant and in the  central breast. This measures 9.4 x 8.7 x 6.7 cm. On ultrasound she had 3 separate masses at 10, 11, and 12:00. The sizes are 1.7, 2.1, and 2.2 cm. There is also a single abnormal axillary lymph node that has undergone biopsy. 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 She is s/p right breast mastectomy with SLNB 0/7 LN on August 16 andl also had tissue expanders in place.  She is currently undergoing chemotherapy for 6 months every other week and radiation will follow; emergency sugery to remove expander 12/04/21    Patient Stated Goals Reassess after surgery    Currently in Pain? No/denies                               [facility] Adult PT Treatment/Exercise - 12/26/21 0001       Manual Therapy   Manual Therapy Soft tissue mobilization;Myofascial release;Passive ROM    Soft tissue mobilization To Rt UE with cocoa butter (after MFR) along entire UE at area of cording    Myofascial Release MFR to right medial upper arm, axilla, and forearm areas of cording.    Scapular Mobilization Scapular depression throughout P/ROM, motion is less restrictive now with this    Passive ROM In supine into Rt shoulder flexion, abduction and D2 flexion; pt with full end motions except still can feel and see pulling from cording                          PT Long Term Goals - 11/20/21 1618       PT LONG TERM GOAL #1   Title Pt will achieve full right shoulder ROM post surgery for full return to daily activities    Time 6    Period Weeks    Status On-going    Target Date 01/01/22      PT LONG TERM GOAL #2   Title Pt will improve right shoulder flexion and  scaption by atleast 15 degrees    Baseline regressed today secondary to cording    Time 6    Period Weeks    Status On-going    Target Date 01/01/22      PT LONG TERM GOAL #3   Title Pt will report decrease achiness and pulling from cording by 50% or more    Time 6    Period Weeks    Status On-going    Target Date 01/01/22      PT LONG TERM GOAL #4   Title Pt will be  able to perform arm movements to continue with her indian dancing    Baseline was able to perform but too fatigued from chemo. now cording limits    Time 6    Period Weeks    Status On-going    Target Date 01/01/22                   Plan - 12/26/21 0854     Clinical Impression Statement Pt had to leave early due to a work meeting today. Continued with manual therapy working to decrease fascial restrictions and cording throughout her Rt UE. Encouraged her to work on incorporating Rt UE end motions stretches into her day more frequently to counteract the cording while she is having to sit at her computer typing for longer periods of time as she is back to work part time now. Pt able to verbalize good understanding.    Personal Factors and Comorbidities Comorbidity 1    Comorbidities right breast cancer s/p mastectomy; presently having chemo and pending radiation; recent emergency surgery to remove expander    Stability/Clinical Decision Making Stable/Uncomplicated    Rehab Potential Excellent    PT Frequency 2x / week    PT Duration 6 weeks    PT Treatment/Interventions ADLs/Self Care Home Management;Therapeutic exercise;Manual techniques;Patient/family education;Manual lymph drainage;Scar mobilization;Passive range of motion;Moist Heat    PT Next Visit Plan Cont MFR for Rt UE cording, PROM, STM to tight areas of upper quadrant prn, Strength ABC; call [facility] for an appt?    PT Home Exercise Plan 4 post op exs , supine wand flexion and scaption; modified downward dog on wall, supine scapular series     Consulted and Agree with Plan of Care Patient             Patient will benefit from skilled therapeutic intervention in order to improve the following deficits and impairments:  Postural dysfunction, Decreased knowledge of precautions, Decreased scar mobility, Increased fascial restricitons, Decreased range of motion  Visit Diagnosis: Malignant neoplasm of overlapping sites of right breast in female, estrogen receptor positive ([place])  Stiffness of right shoulder, not elsewhere classified  Abnormal posture     Problem List Patient Active Problem List   Diagnosis Date Noted   Breast cancer, right ([place]) 12/04/2021   Port-A-Cath in place 08/25/2021   S/P mastectomy, right 07/25/2021   Genetic testing 07/01/2021   Cancer ([place]) 06/26/2021   Family history of skin cancer 06/22/2021   Family history of brain tumor 06/22/2021   Malignant neoplasm of overlapping sites of right breast in female, estrogen receptor positive ([place]) 06/21/2021   Premature uterine contractions causing threatened premature labor in third trimester 03/03/2018   Previous cesarean delivery, delivered 03/03/2018   Postpartum care following cesarean delivery (3/25) 03/03/2018   Status post repeat low transverse cesarean section 03/03/2018   DM (diabetes mellitus) in pregnancy 03/02/2018   Incompetent cervix in pregnancy 03/02/2018   Premature uterine contractions in third trimester, antepartum 03/02/2018   Active labor 10/12/2015    Otelia Limes, PTA 12/26/2021, 9:01 AM  [facility] @ [address] [place], [place], [address] Phone: [phone]   Fax:  [phone]  Name: LENNOX LEIKAM MRN: [id number] Date of Birth: [birth year]

## 2021-12-28 ENCOUNTER — Encounter: Payer: Self-pay | Admitting: *Deleted

## 2021-12-28 ENCOUNTER — Ambulatory Visit: Payer: 59

## 2021-12-28 ENCOUNTER — Other Ambulatory Visit: Payer: Self-pay

## 2021-12-28 DIAGNOSIS — C50811 Malignant neoplasm of overlapping sites of right female breast: Secondary | ICD-10-CM | POA: Diagnosis not present

## 2021-12-28 DIAGNOSIS — M25611 Stiffness of right shoulder, not elsewhere classified: Secondary | ICD-10-CM

## 2021-12-28 DIAGNOSIS — Z170 Estrogen receptor positive status [ER+]: Secondary | ICD-10-CM

## 2021-12-28 DIAGNOSIS — R293 Abnormal posture: Secondary | ICD-10-CM

## 2021-12-28 NOTE — Therapy (Signed)
[facility] @ [address] [place], [place], [address] Phone: [phone]   Fax:  [phone]  Physical Therapy Treatment  Patient Details  Name: Allison Reed MRN: [id number] Date of Birth: [birth year] Referring Provider (PT): Dr. Donne Hazel   Encounter Date: 12/28/2021   PT End of Session - 12/28/21 0903     Visit Number 16    Number of Visits 21    Date for PT Re-Evaluation 01/01/22    PT Start Time 0803    PT Stop Time 0902    PT Time Calculation (min) 59 min    Activity Tolerance Patient tolerated treatment well    Behavior During Therapy [facility] for tasks assessed/performed             Past Medical History:  Diagnosis Date   Diabetes mellitus without complication ([place])    Type 2   Family history of brain tumor    Family history of skin cancer    Gestational diabetes    Headache    otc med prn   Heartburn in pregnancy    Incompetent cervix in pregnancy 04/2015   Missed abortion    no surgery required   Postpartum care following cesarean delivery (11/2) 10/12/2015   right breast ca 06/2021   UTI in pregnancy 03/2015   recent UTI, treatment completed    Past Surgical History:  Procedure Laterality Date   APPENDECTOMY  2006   BREAST RECONSTRUCTION WITH PLACEMENT OF TISSUE EXPANDER AND ALLODERM Right 07/25/2021   Procedure: RIGHT BREAST RECONSTRUCTION WITH PLACEMENT OF TISSUE EXPANDER AND ALLODERM;  Surgeon: Irene Limbo, MD;  Location: [facility];  Service: Plastics;  Laterality: Right;   CERCLAGE LAPAROSCOPIC ABDOMINAL N/A 04/15/2015   Procedure: LAPAROSCOPIC TRANSABDOMINAL CERVCOISTHMIC CERCLAGE;  Surgeon: Governor Specking, MD;  Location: [facility];  Service: Gynecology;  Laterality: N/A;   CERVICAL CERCLAGE  02/2011   vaginal   CESAREAN SECTION N/A 10/12/2015   Procedure: CESAREAN SECTION;  Surgeon: Princess Bruins, MD;  Location: [facility];  Service: Obstetrics;  Laterality: N/A;   CESAREAN  SECTION N/A 03/03/2018   Procedure: Repeat CESAREAN SECTION/Removal of Abdominal Cerclage;  Surgeon: Azucena Fallen, MD;  Location: [facility];  Service: Obstetrics;  Laterality: N/A;  EDD: 03/28/18   MASTECTOMY W/ SENTINEL NODE BIOPSY Right 07/25/2021   Procedure: RIGHT MASTECTOMY WITH AXILLARY SENTINEL LYMPH NODE BIOPSY;  Surgeon: [name], MD;  Location: [facility];  Service: General;  Laterality: Right;   PORTACATH PLACEMENT N/A 08/24/2021   Procedure: INSERTION PORT-A-CATH;  Surgeon: [name], MD;  Location: [facility];  Service: General;  Laterality: N/A;   RADIOACTIVE SEED GUIDED AXILLARY SENTINEL LYMPH NODE Right 07/25/2021   Procedure: RADIOACTIVE SEED GUIDED RIGHT AXILLARY NODE EXCISION;  Surgeon: [name], MD;  Location: [facility];  Service: General;  Laterality: Right;   TISSUE EXPANDER PLACEMENT Right 12/04/2021   Procedure: REMOVAL RIGHT CHEST TISSUE EXPANDER;  Surgeon: Irene Limbo, MD;  Location: [facility];  Service: Plastics;  Laterality: Right;   WISDOM TOOTH EXTRACTION  2001    There were no vitals filed for this visit.   Subjective Assessment - 12/28/21 0817     Subjective Overall I feel like the cording is about 95% better. I would really like to keep working on the last lttle bit of cording in my forearm though because I can feel it the most when I'm working/typing for hours at a time. Last time I  forgot to wear the TG soft sleeve after therapy and my arm was really sore and felt a little full at my trunk. So I slept in it that night and wore it all yseterday and it feels so much better. I haven't got to call about the compression sleeve yet.    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 She noted a palpable right breast mass. This is followed by a mammogram with a mass with calcifications as really throughout the upper outer quadrant and in the central breast. This  measures 9.4 x 8.7 x 6.7 cm. On ultrasound she had 3 separate masses at 10, 11, and 12:00. The sizes are 1.7, 2.1, and 2.2 cm. There is also a single abnormal axillary lymph node that has undergone biopsy. 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 She is s/p right breast mastectomy with SLNB 0/7 LN on August 16 andl also had tissue expanders in place.  She is currently undergoing chemotherapy for 6 months every other week and radiation will follow; emergency sugery to remove expander 12/04/21    Patient Stated Goals Reassess after surgery    Currently in Pain? No/denies                               [facility] Adult PT Treatment/Exercise - 12/28/21 0001       Manual Therapy   Manual Therapy Soft tissue mobilization;Myofascial release;Passive ROM;Manual Lymphatic Drainage (MLD)    Soft tissue mobilization To Rt UE with cocoa butter (after MFR) along entire UE at area of cording    Myofascial Release MFR to right medial upper arm, axilla, and forearm areas of cording.    Scapular Mobilization Scapular depression throughout P/ROM, motion is less restrictive now with this    Manual Lymphatic Drainage (MLD) In Supine: Short neck, 5 diaphragmatic breaths, Rt inguinal and Lt axillary nodes, Rt axillo-inguinal and anterior inter-axillary anastomosis and then Rt UE and focuse don lateral trunk redirecting towards anastomosis.    Passive ROM In supine into Rt shoulder flexion, abduction and D2 flexion; pt with full end motions except still can feel and see pulling from cording                          PT Long Term Goals - 11/20/21 1618       PT LONG TERM  GOAL #1   Title Pt will achieve full right shoulder ROM post surgery for full return to daily activities    Time 6    Period Weeks    Status On-going    Target Date 01/01/22      PT LONG TERM GOAL #2   Title Pt will improve right shoulder flexion and scaption by atleast 15 degrees    Baseline regressed today secondary to cording    Time 6    Period Weeks    Status On-going    Target Date 01/01/22      PT LONG TERM GOAL #3   Title Pt will report decrease achiness and pulling from cording by 50% or more  Time 6    Period Weeks    Status On-going    Target Date 01/01/22      PT LONG TERM GOAL #4   Title Pt will be able to perform arm movements to continue with her indian dancing    Baseline was able to perform but too fatigued from chemo. now cording limits    Time 6    Period Weeks    Status On-going    Target Date 01/01/22                   Plan - 12/28/21 1259     Clinical Impression Statement Pt reports has yet to be able to call [facility] about a getting a compression sleeve but would like to see if she is in network for [facility] custom fit so with permission sent her demographics to them today. Continued with manual therapy working to decrease remaining cording that is limiting her end motions and is still visible, though less so now, in UE mostly at antecubital fossa into forearm. Also included MLD today to see if this will help further reduced cording symptoms.    Personal Factors and Comorbidities Comorbidity 1    Comorbidities right breast cancer s/p mastectomy; presently having chemo and pending radiation; recent emergency surgery to remove expander    Stability/Clinical Decision Making Stable/Uncomplicated    Rehab Potential Excellent    PT Frequency 2x / week    PT Duration 6 weeks    PT Treatment/Interventions ADLs/Self Care Home Management;Therapeutic exercise;Manual techniques;Patient/family education;Manual lymph drainage;Scar mobilization;Passive  range of motion;Moist Heat    PT Next Visit Plan Renewal next session as pt would like to cont but reduce to 1x/wk; Cont MFR for Rt UE cording with P/ROM and instruct in Strength ABC; hear back from [facility] custom fit?    PT Home Exercise Plan 4 post op exs , supine wand flexion and scaption; modified downward dog on wall, supine scapular series    Recommended Other Services Faxed demo to [employer] to check insurance coverage    Consulted and Agree with Plan of Care Patient             Patient will benefit from skilled therapeutic intervention in order to improve the following deficits and impairments:  Postural dysfunction, Decreased knowledge of precautions, Decreased scar mobility, Increased fascial restricitons, Decreased range of motion  Visit Diagnosis: Malignant neoplasm of overlapping sites of right breast in female, estrogen receptor positive ([place])  Stiffness of right shoulder, not elsewhere classified  Abnormal posture     Problem List Patient Active Problem List   Diagnosis Date Noted   Breast cancer, right ([place]) 12/04/2021   Port-A-Cath in place 08/25/2021   S/P mastectomy, right 07/25/2021   Genetic testing 07/01/2021   Cancer ([facility]) 06/26/2021   Family history of skin cancer 06/22/2021   Family history of brain tumor 06/22/2021   Malignant neoplasm of overlapping sites of right breast in female, estrogen receptor positive ([place]) 06/21/2021   Premature uterine contractions causing threatened premature labor in third trimester 03/03/2018   Previous cesarean delivery, delivered 03/03/2018   Postpartum care following cesarean delivery (3/25) 03/03/2018   Status post repeat low transverse cesarean section 03/03/2018   DM (diabetes mellitus) in pregnancy 03/02/2018   Incompetent cervix in pregnancy 03/02/2018   Premature uterine contractions in third trimester, antepartum 03/02/2018   Active labor 10/12/2015    Otelia Limes, PTA 12/28/2021, 1:07  PM  [facility] @ [address] [place], [place], [address] Phone: [phone]   Fax:  [phone]  Name: Allison Reed MRN: [id number] Date of Birth: 01-04-1976

## 2021-12-29 ENCOUNTER — Inpatient Hospital Stay: Payer: 59

## 2021-12-29 VITALS — BP 115/84 | HR 85 | Temp 97.6°F | Resp 18 | Wt 155.5 lb

## 2021-12-29 DIAGNOSIS — Z170 Estrogen receptor positive status [ER+]: Secondary | ICD-10-CM

## 2021-12-29 DIAGNOSIS — Z95828 Presence of other vascular implants and grafts: Secondary | ICD-10-CM

## 2021-12-29 DIAGNOSIS — C50811 Malignant neoplasm of overlapping sites of right female breast: Secondary | ICD-10-CM | POA: Diagnosis not present

## 2021-12-29 LAB — CBC WITH DIFFERENTIAL (CANCER CENTER ONLY)
Abs Immature Granulocytes: 0.02 10*3/uL (ref 0.00–0.07)
Basophils Absolute: 0 10*3/uL (ref 0.0–0.1)
Basophils Relative: 1 %
Eosinophils Absolute: 0.2 10*3/uL (ref 0.0–0.5)
Eosinophils Relative: 5 %
HCT: 32 % — ABNORMAL LOW (ref 36.0–46.0)
Hemoglobin: 10.2 g/dL — ABNORMAL LOW (ref 12.0–15.0)
Immature Granulocytes: 1 %
Lymphocytes Relative: 32 %
Lymphs Abs: 1.2 10*3/uL (ref 0.7–4.0)
MCH: 25.4 pg — ABNORMAL LOW (ref 26.0–34.0)
MCHC: 31.9 g/dL (ref 30.0–36.0)
MCV: 79.6 fL — ABNORMAL LOW (ref 80.0–100.0)
Monocytes Absolute: 0.2 10*3/uL (ref 0.1–1.0)
Monocytes Relative: 5 %
Neutro Abs: 2.1 10*3/uL (ref 1.7–7.7)
Neutrophils Relative %: 56 %
Platelet Count: 305 10*3/uL (ref 150–400)
RBC: 4.02 MIL/uL (ref 3.87–5.11)
RDW: 16 % — ABNORMAL HIGH (ref 11.5–15.5)
WBC Count: 3.7 10*3/uL — ABNORMAL LOW (ref 4.0–10.5)
nRBC: 0 % (ref 0.0–0.2)

## 2021-12-29 LAB — CMP (CANCER CENTER ONLY)
ALT: 20 U/L (ref 0–44)
AST: 18 U/L (ref 15–41)
Albumin: 4.2 g/dL (ref 3.5–5.0)
Alkaline Phosphatase: 53 U/L (ref 38–126)
Anion gap: 8 (ref 5–15)
BUN: 7 mg/dL (ref 6–20)
CO2: 24 mmol/L (ref 22–32)
Calcium: 9.4 mg/dL (ref 8.9–10.3)
Chloride: 105 mmol/L (ref 98–111)
Creatinine: 0.5 mg/dL (ref 0.44–1.00)
GFR, Estimated: >60 mL/min (ref >60)
Glucose, Bld: 153 mg/dL — ABNORMAL HIGH (ref 70–99)
Potassium: 3.9 mmol/L (ref 3.5–5.1)
Sodium: 137 mmol/L (ref 135–145)
Total Bilirubin: 0.4 mg/dL (ref 0.3–1.2)
Total Protein: 6.5 g/dL (ref 6.5–8.1)

## 2021-12-29 LAB — IRON AND IRON BINDING CAPACITY (CC-WL,HP ONLY)
Iron: 23 ug/dL — ABNORMAL LOW (ref 28–170)
Saturation Ratios: 5 % — ABNORMAL LOW (ref 10.4–31.8)
TIBC: 477 ug/dL — ABNORMAL HIGH (ref 250–450)
UIBC: 454 ug/dL — ABNORMAL HIGH (ref 148–442)

## 2021-12-29 LAB — FERRITIN: Ferritin: 10 ng/mL — ABNORMAL LOW (ref 11–307)

## 2021-12-29 MED ORDER — SODIUM CHLORIDE 0.9 % IV SOLN: Freq: Once | INTRAVENOUS | Status: AC

## 2021-12-29 MED ORDER — SODIUM CHLORIDE 0.9 % IV SOLN
80.0000 mg/m2 | Freq: Once | INTRAVENOUS | Status: AC
  Administered 2021-12-29: 144 mg via INTRAVENOUS
  Filled 2021-12-29: qty 24

## 2021-12-29 MED ORDER — HEPARIN SOD (PORK) LOCK FLUSH 100 UNIT/ML IV SOLN: 500.0000 [IU] | Freq: Once | INTRAVENOUS | Status: DC | PRN

## 2021-12-29 MED ORDER — SODIUM CHLORIDE 0.9% FLUSH: 10.0000 mL | INTRAVENOUS | Status: DC | PRN

## 2021-12-29 MED ORDER — DEXAMETHASONE SODIUM PHOSPHATE 10 MG/ML IJ SOLN
4.0000 mg | Freq: Once | INTRAMUSCULAR | Status: AC
  Administered 2021-12-29: 4 mg via INTRAVENOUS
  Filled 2021-12-29: qty 1

## 2021-12-29 MED ORDER — DIPHENHYDRAMINE HCL 50 MG/ML IJ SOLN
25.0000 mg | Freq: Once | INTRAMUSCULAR | Status: AC
  Administered 2021-12-29: 25 mg via INTRAVENOUS
  Filled 2021-12-29: qty 1

## 2021-12-29 MED ORDER — SODIUM CHLORIDE 0.9% FLUSH
10.0000 mL | Freq: Once | INTRAVENOUS | Status: AC
  Administered 2021-12-29: 10 mL

## 2021-12-29 MED ORDER — FAMOTIDINE 20 MG IN NS 100 ML IVPB
20.0000 mg | Freq: Once | INTRAVENOUS | Status: AC
  Administered 2021-12-29: 20 mg via INTRAVENOUS
  Filled 2021-12-29: qty 100

## 2021-12-29 NOTE — Patient Instructions (Signed)
[facility]  Discharge Instructions: Thank you for choosing [facility] to provide your oncology and hematology care.   If you have a lab appointment with the [facility], please go directly to the [facility] and check in at the registration area.   Wear comfortable clothing and clothing appropriate for easy access to any Portacath or PICC line.   We strive to give you quality time with your provider. You may need to reschedule your appointment if you arrive late (15 or more minutes).  Arriving late affects you and other patients whose appointments are after yours.  Also, if you miss three or more appointments without notifying the office, you may be dismissed from the clinic at the providers discretion.      For prescription refill requests, have your pharmacy contact our office and allow 72 hours for refills to be completed.    Today you received the following chemotherapy and/or immunotherapy agents: Taxol   To help prevent nausea and vomiting after your treatment, we encourage you to take your nausea medication as directed.  BELOW ARE SYMPTOMS THAT SHOULD BE REPORTED IMMEDIATELY: *FEVER GREATER THAN 100.4 F (38 C) OR HIGHER *CHILLS OR SWEATING *NAUSEA AND VOMITING THAT IS NOT CONTROLLED WITH YOUR NAUSEA MEDICATION *UNUSUAL SHORTNESS OF BREATH *UNUSUAL BRUISING OR BLEEDING *URINARY PROBLEMS (pain or burning when urinating, or frequent urination) *BOWEL PROBLEMS (unusual diarrhea, constipation, pain near the anus) TENDERNESS IN MOUTH AND THROAT WITH OR WITHOUT PRESENCE OF ULCERS (sore throat, sores in mouth, or a toothache) UNUSUAL RASH, SWELLING OR PAIN  UNUSUAL VAGINAL DISCHARGE OR ITCHING   Items with * indicate a potential emergency and should be followed up as soon as possible or go to the Emergency Department if any problems should occur.  Please show the CHEMOTHERAPY ALERT CARD or IMMUNOTHERAPY ALERT CARD at check-in to the  Emergency Department and triage nurse.  Should you have questions after your visit or need to cancel or reschedule your appointment, please contact [facility]  Dept: [phone]  and follow the prompts.  Office hours are 8:00 a.m. to 4:30 p.m. Monday - Friday. Please note that voicemails left after 4:00 p.m. may not be returned until the following business day.  We are closed weekends and major holidays. You have access to a nurse at all times for urgent questions. Please call the main number to the clinic Dept: [phone] and follow the prompts.   For any non-urgent questions, you may also contact your provider using MyChart. We now offer e-Visits for anyone 80 and older to request care online for non-urgent symptoms. For details visit [email or web address].   Also download the MyChart app! Go to the app store, search "MyChart", open the app, select [facility], and log in with your MyChart username and password.  Due to Covid, a mask is required upon entering the hospital/clinic. If you do not have a mask, one will be given to you upon arrival. For doctor visits, patients may have 1 support person aged 17 or older with them. For treatment visits, patients cannot have anyone with them due to current Covid guidelines and our immunocompromised population.

## 2022-01-01 ENCOUNTER — Ambulatory Visit: Payer: 59

## 2022-01-01 ENCOUNTER — Other Ambulatory Visit: Payer: Self-pay

## 2022-01-01 DIAGNOSIS — R293 Abnormal posture: Secondary | ICD-10-CM

## 2022-01-01 DIAGNOSIS — C50811 Malignant neoplasm of overlapping sites of right female breast: Secondary | ICD-10-CM

## 2022-01-01 DIAGNOSIS — M25611 Stiffness of right shoulder, not elsewhere classified: Secondary | ICD-10-CM

## 2022-01-01 DIAGNOSIS — Z170 Estrogen receptor positive status [ER+]: Secondary | ICD-10-CM

## 2022-01-01 NOTE — Therapy (Signed)
[facility] @ [place] [place], [place], [address] Phone: [phone]   Fax:  [phone]  Physical Therapy Treatment  Patient Details  Name: Allison Reed MRN: [id number] Date of Birth: 1976/11/10 Referring Provider (PT): Dr. Donne Hazel   Encounter Date: 01/01/2022   PT End of Session - 01/01/22 1550     Visit Number 17    Number of Visits 23    Date for PT Re-Evaluation 02/12/22    PT Start Time 1502    PT Stop Time 1548    PT Time Calculation (min) 46 min    Activity Tolerance Patient tolerated treatment well    Behavior During Therapy [facility] for tasks assessed/performed             Past Medical History:  Diagnosis Date   Diabetes mellitus without complication ([place])    Type 2   Family history of brain tumor    Family history of skin cancer    Gestational diabetes    Headache    otc med prn   Heartburn in pregnancy    Incompetent cervix in pregnancy 04/2015   Missed abortion    no surgery required   Postpartum care following cesarean delivery (11/2) 10/12/2015   right breast ca 06/2021   UTI in pregnancy 03/2015   recent UTI, treatment completed    Past Surgical History:  Procedure Laterality Date   APPENDECTOMY  2006   BREAST RECONSTRUCTION WITH PLACEMENT OF TISSUE EXPANDER AND ALLODERM Right 07/25/2021   Procedure: RIGHT BREAST RECONSTRUCTION WITH PLACEMENT OF TISSUE EXPANDER AND ALLODERM;  Surgeon: Irene Limbo, MD;  Location: [facility];  Service: Plastics;  Laterality: Right;   CERCLAGE LAPAROSCOPIC ABDOMINAL N/A 04/15/2015   Procedure: LAPAROSCOPIC TRANSABDOMINAL CERVCOISTHMIC CERCLAGE;  Surgeon: Governor Specking, MD;  Location: [facility];  Service: Gynecology;  Laterality: N/A;   CERVICAL CERCLAGE  02/2011   vaginal   CESAREAN SECTION N/A 10/12/2015   Procedure: CESAREAN SECTION;  Surgeon: Princess Bruins, MD;  Location: [facility];  Service: Obstetrics;  Laterality: N/A;   CESAREAN  SECTION N/A 03/03/2018   Procedure: Repeat CESAREAN SECTION/Removal of Abdominal Cerclage;  Surgeon: Azucena Fallen, MD;  Location: [facility];  Service: Obstetrics;  Laterality: N/A;  EDD: 03/28/18   MASTECTOMY W/ SENTINEL NODE BIOPSY Right 07/25/2021   Procedure: RIGHT MASTECTOMY WITH AXILLARY SENTINEL LYMPH NODE BIOPSY;  Surgeon: [name], MD;  Location: [facility];  Service: General;  Laterality: Right;   PORTACATH PLACEMENT N/A 08/24/2021   Procedure: INSERTION PORT-A-CATH;  Surgeon: [name], MD;  Location: [facility];  Service: General;  Laterality: N/A;   RADIOACTIVE SEED GUIDED AXILLARY SENTINEL LYMPH NODE Right 07/25/2021   Procedure: RADIOACTIVE SEED GUIDED RIGHT AXILLARY NODE EXCISION;  Surgeon: [name], MD;  Location: [facility];  Service: General;  Laterality: Right;   TISSUE EXPANDER PLACEMENT Right 12/04/2021   Procedure: REMOVAL RIGHT CHEST TISSUE EXPANDER;  Surgeon: Irene Limbo, MD;  Location: [facility];  Service: Plastics;  Laterality: Right;   WISDOM TOOTH EXTRACTION  2001    There were no vitals filed for this visit.   Subjective Assessment - 01/01/22 1501     Subjective The cords are so much better even since last visit. The forearm is still tight from the cord around the elbow..  When I write my right scapular region trembles but I had written about a page then.  I don't feel it with the computer  work as much.  I have not gotten my compression sleeve yet, but the TG soft does help.    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 She noted a palpable right breast mass. This is followed by a mammogram with a mass with calcifications as really throughout the upper outer quadrant and in the central breast. This measures 9.4 x 8.7 x 6.7 cm. On ultrasound she had 3 separate masses at 10, 11, and 12:00. The sizes are 1.7, 2.1, and 2.2 cm. There is also a single abnormal  axillary lymph node that has undergone biopsy. 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 She is s/p right breast mastectomy with SLNB 0/7 LN on August 16 andl also had tissue expanders in place.  She is currently undergoing chemotherapy for 6 months every other week and radiation will follow; emergency sugery to remove expander 12/04/21    Patient Stated Goals Reassess after surgery    Currently in Pain? No/denies    Pain Score 0-No pain    Multiple Pain Sites No                [facility] PT Assessment - 01/01/22 0001       Assessment   Medical Diagnosis Right breast Cancer    Referring Provider (PT) Dr. Donne Hazel    Hand Dominance Right    Prior Therapy yes      Precautions   Precaution Comments lymphedema risk      AROM   Right Shoulder Extension 48 Degrees    Right Shoulder Flexion 162 Degrees   pain in forearm from cord   Right Shoulder ABduction 175 Degrees   end range tightness from cording in upper arm   Right Shoulder External Rotation 95 Degrees    Left Shoulder Extension 48 Degrees    Left Shoulder Flexion 160 Degrees    Left Shoulder ABduction 180 Degrees    Left Shoulder External Rotation 95 Degrees                           [facility] Adult PT Treatment/Exercise - 01/01/22 0001       Shoulder Exercises: Therapy Ball   Other Therapy Ball Exercises shoulder wall stabs x 15 up/down/ side to side x 15      Manual Therapy   Manual Therapy Myofascial release;Passive ROM    Myofascial Release MFR to right medial upper arm, axilla, and forearm areas of cording.    Passive ROM In supine into Rt shoulder flexion, abduction and D2 flexion; pt  with full end motions except still can feel and see pulling from cording                          PT Long Term Goals - 01/01/22 1515       PT LONG TERM GOAL #1   Title Pt will achieve full right shoulder ROM post surgery for full return to daily activities    Baseline nearly met    Time 6    Period Weeks    Status On-going    Target Date  02/12/22      PT LONG TERM GOAL #2   Title Pt will improve right shoulder flexion and scaption by atleast 15 degrees    Time 6    Period Weeks    Status Achieved    Target Date 01/01/22      PT LONG TERM GOAL #3   Title Pt will report decrease achiness and pulling from cording by 50% or more    Baseline 95% better    Time 6    Period Weeks    Status Achieved    Target Date 01/01/22      PT LONG TERM GOAL #4   Title Pt will be able to perform arm movements to continue with her indian dancing    Baseline shoulder ROM is likely good for [place] dancing, but she is still fatigued from chemo. 3 more left    Time 6    Period Weeks    Status Partially Met    Target Date 02/12/22      PT LONG TERM GOAL #5   Title Pt  will progress through ABC strength class or other scapular/shoulder strengthening    Period Weeks    Status New    Target Date 02/12/22                   Plan - 01/01/22 1551     Clinical Impression Statement Pt has made very good improvement with right shoulder ROM and she notes cording improved by 95%.  She does still have palpable and tight cording noted in the right forearm and axilla, and we had 1 big pop in the elbow  with release techniques today. Pt noted feeling very weak in the scapular region with trembling there with cording, and she was instructed in ball stabs on wall today that was very fatiguing for her.  She will benefit from additional visits to continue to work on cording, MLD prn and progressing to strength.  She was instructed to call her insurance company to see who is a provider for  compression sleeves. Will decrease to 1x/week    Personal Factors and Comorbidities Comorbidity 1    Comorbidities right breast cancer s/p mastectomy; presently having chemo and pending radiation; recent emergency surgery to remove expander    Stability/Clinical Decision Making Stable/Uncomplicated    Rehab Potential Excellent    PT Frequency 1x / week    PT Duration 6 weeks    PT Treatment/Interventions ADLs/Self Care Home Management;Therapeutic exercise;Manual techniques;Patient/family education;Manual lymph drainage;Scar mobilization;Passive range of motion;Moist Heat    PT Next Visit Plan decrease to 1x/week, MFR for cording, MLD prn, ABC strength, scapular strength    PT Home Exercise Plan 4 post op exs , supine wand flexion and scaption; modified downward dog on wall, supine scapular series    Consulted and Agree with Plan of Care Patient             Patient will benefit from skilled therapeutic intervention in order to improve the following deficits and impairments:  Postural dysfunction, Decreased knowledge of precautions, Decreased scar mobility, Increased fascial restricitons, Decreased range of motion  Visit Diagnosis: Malignant neoplasm of overlapping sites of right breast in female, estrogen receptor positive ([facility])  Stiffness of right shoulder, not elsewhere classified  Abnormal posture     Problem List Patient Active Problem List   Diagnosis Date Noted   Breast cancer, right ([facility]) 12/04/2021   Port-A-Cath in place 08/25/2021   S/P mastectomy, right 07/25/2021  Genetic testing 07/01/2021   Cancer ([place]) 06/26/2021   Family history of skin cancer 06/22/2021   Family history of brain tumor 06/22/2021   Malignant neoplasm of overlapping sites of right breast in female, estrogen receptor positive ([place]) 06/21/2021   Premature uterine contractions causing threatened premature labor in third trimester 03/03/2018   Previous cesarean delivery, delivered 03/03/2018    Postpartum care following cesarean delivery (3/25) 03/03/2018   Status post repeat low transverse cesarean section 03/03/2018   DM (diabetes mellitus) in pregnancy 03/02/2018   Incompetent cervix in pregnancy 03/02/2018   Premature uterine contractions in third trimester, antepartum 03/02/2018   Active labor 10/12/2015    Claris Pong, PT 01/01/2022, 3:57 PM  [facility] @ [address] [place], [place], [address] Phone: [phone]   Fax:  [phone]  Name: ADEJA SARRATT MRN: [id number] Date of Birth: October 01, 1976

## 2022-01-03 NOTE — Progress Notes (Signed)
Patient Care Team: [name], MD as PCP - General (Endocrinology) Mauro Kaufmann, RN as Oncology Nurse Navigator Rockwell Germany, RN as Oncology Nurse Navigator  DIAGNOSIS:    ICD-10-CM   1. Malignant neoplasm of overlapping sites of right breast in female, estrogen receptor positive ([facility])  C50.811    Z17.0       SUMMARY OF ONCOLOGIC HISTORY: Oncology History  Malignant neoplasm of overlapping sites of right breast in female, estrogen receptor positive ([facility])  06/16/2021 Initial Diagnosis   Palpable right breast mass: Breast MRI revealed extensive involvement of the right breast with mass and non-mass enhancement superior right breast mass measures 4.9 x 1.2 cm and together with non-mass enhancement measured 8.8 cm, enhancing mass LOQ 1.3 cm, left breast indeterminate 0.9 cm mass and a 0.8 cm mass UOQ, single abnormal lymph node Biopsy 10:00, 11:00 and 12:00: IDC with DCIS, biopsy right axillary lymph node: IDC, ER 75 to 95%, PR 90 to 95%, Ki-67 25%, HER2 negative on 1 biopsy and 2+ by IHC and FISH pending   06/21/2021 Cancer Staging   Staging form: Breast, AJCC 8th Edition - Clinical stage from 06/21/2021: Stage IIA (cT3, cN1, cM0, G2, ER+, PR+, HER2-) - Signed by [name], MD on 06/21/2021 Histologic grading system: 3 grade system    07/01/2021 Genetic Testing   Negative genetic testing:  No pathogenic variants detected on the [facility] BRCAplus panel (report date 07/01/2021) or the [facility] CancerNext-Expanded + RNAinsight panel (report date 07/01/2021).   The BRCAplus panel offered by [employer] and includes sequencing and deletion/duplication analysis for the following 8 genes: ATM, BRCA1, BRCA2, CDH1, CHEK2, PALB2, PTEN, and TP53. The CancerNext-Expanded + RNAinsight gene panel offered by [employer] and includes sequencing and rearrangement analysis for the following 77 genes: AIP, ALK, APC, ATM, AXIN2, BAP1, BARD1, BLM, BMPR1A, BRCA1, BRCA2, BRIP1, CDC73, CDH1, CDK4,  CDKN1B, CDKN2A, CHEK2, CTNNA1, DICER1, FANCC, FH, FLCN, GALNT12, KIF1B, LZTR1, MAX, MEN1, MET, MLH1, MSH2, MSH3, MSH6, MUTYH, NBN, NF1, NF2, NTHL1, PALB2, PHOX2B, PMS2, POT1, PRKAR1A, PTCH1, PTEN, RAD51C, RAD51D, RB1, RECQL, RET, SDHA, SDHAF2, SDHB, SDHC, SDHD, SMAD4, SMARCA4, SMARCB1, SMARCE1, STK11, SUFU, TMEM127, TP53, TSC1, TSC2, VHL and XRCC2 (sequencing and deletion/duplication); EGFR, EGLN1, HOXB13, KIT, MITF, PDGFRA, POLD1 and POLE (sequencing only); EPCAM and GREM1 (deletion/duplication only). RNA data is routinely analyzed for use in variant interpretation for all genes.   07/25/2021 Surgery   Right mastectomy: Foci of invasive ductal carcinoma measuring 1.1 cm grade 2 with extensive DCIS spanning 8.9 cm, margins negative, 4/7 lymph nodes, ER 75 to 95%, PR 9095%, HER2 negative, Ki-67 25%   08/25/2021 -  Chemotherapy   Patient is on Treatment Plan : BREAST ADJUVANT DOSE DENSE AC q14d / PACLitaxel q7d       CHIEF COMPLIANT: Cycle 10 Taxol  INTERVAL HISTORY: Allison Reed is a 46 y.o. with above-mentioned history of right breast cancer treated with mastectomies currently on adjuvant chemotherapy. She presents to the clinic today for treatment.  She is here for cycle 10 of chemotherapy with Taxol.  She continues to have fatigue.  Labs done last week revealed that she is iron deficient with a ferritin of 10.  She had 1 heavy menstrual cycle which is also causing her to be tired.  ALLERGIES:  is allergic to betadine [povidone iodine] and tape.  MEDICATIONS:  Current Outpatient Medications  Medication Sig Dispense Refill   Empagliflozin-metFORMIN HCl ER (SYNJARDY XR) 09-999 MG TB24 Take by mouth.  lidocaine-prilocaine (EMLA) cream Apply to affected area once 30 g 3   sitaGLIPtin (JANUVIA) 100 MG tablet Take 100 mg by mouth daily.     No current facility-administered medications for this visit.    PHYSICAL EXAMINATION: ECOG PERFORMANCE STATUS: 1 - Symptomatic but completely  ambulatory  Vitals:   01/04/22 0835  BP: 109/70  Pulse: (!) 105  Resp: 18  Temp: (!) 97.4 F (36.3 C)  SpO2: 100%   Filed Weights   01/04/22 0835  Weight: 153 lb 11.2 oz (69.7 kg)    LABORATORY DATA:  I have reviewed the data as listed CMP Latest Ref Rng & Units 12/29/2021 12/22/2021 12/15/2021  Glucose 70 - 99 mg/dL 153(H) 163(H) 199(H)  BUN 6 - 20 mg/dL _0 Creatinine 0.44 - 1.00 mg/dL 0.50 0.48 0.58  Sodium 135 - 145 mmol/L 137 137 137  Potassium 3.5 - 5.1 mmol/L 3.9 3.7 4.2  Chloride 98 - 111 mmol/L 105 103 104  CO2 22 - 32 mmol/L _1 Calcium 8.9 - 10.3 mg/dL 9.4 9.4 9.1  Total Protein 6.5 - 8.1 g/dL 6.5 6.7 6.9  Total Bilirubin 0.3 - 1.2 mg/dL 0.4 0.5 0.4  Alkaline Phos 38 - 126 U/L 53 55 62  AST 15 - 41 U/L _2 ALT 0 - 44 U/L _3 Lab Results  Component Value Date   WBC 3.5 (L) 01/04/2022   HGB 10.3 (L) 01/04/2022   HCT 32.5 (L) 01/04/2022   MCV 78.5 (L) 01/04/2022   PLT 292 01/04/2022   NEUTROABS 2.1 01/04/2022    ASSESSMENT & PLAN:  Malignant neoplasm of overlapping sites of right breast in female, estrogen receptor positive ([facility]) 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, margins negative, 4/7 lymph nodes, ER 75 to 95%, PR 9095%, HER2 negative, Ki-67 25% Genetics: Negative MammaPrint: Low risk (this is not a valid test for 4+ lymph nodes) Repeat prognostic panel on the lymph node in the final pathology also came back as ER/PR positive   Treatment plan: 1.  Adjuvant chemotherapy with dose dense Adriamycin and Cytoxan x4 followed by Taxol weekly x12 started 08/25/2021 2. adjuvant radiation therapy 3.  Followed by adjuvant antiestrogen therapy with complete estrogen blockade and abemaciclib (discussed oophorectomy versus Zoladex plus letrozole plus  abemaciclib) ------------------------------------------------------------------------------------------------------------------------ Current treatment: Completed 4 cycles of dose dense Adriamycin and Cytoxan, today cycle 10 Taxol 08/18/2021: Echocardiogram: EF 55 to 60%   Chemo toxicities: 1.  Mild fatigue 2. 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  She might require dose reduction if it continues to persist.   She denies any nausea or vomiting. She is using compression stockings as a form of prevention of neuropathy.     Diabetes: Appears to be under reasonable control..  Breast expander removal surgery: Patient might consider doing a DIEP flap in the future.   Iron deficiency anemia: Ferritin 10: Recommended 3 doses of Venofer to be given with her Taxol treatments. We will try to see if it can be administered today.   Antiestrogen therapy counseling: We discussed antiestrogen therapy plan with regards to Zoladex injections versus oophorectomy along with letrozole and abemaciclib  Return to clinic weekly for Taxol and in 2 weeks she will come for her 12th and final cycle of Taxol.    No orders of the defined types were placed in  this encounter.  The patient has a good understanding of the overall plan. she agrees with it. she will call with any problems that may develop before the next visit here.  Total time spent: 30 mins including face to face time and time spent for planning, charting and coordination of care  Rulon Eisenmenger, MD, MPH 01/04/2022  I, Thana Ates, am acting as scribe for Dr. [name].  I have reviewed the above documentation for accuracy and completeness, and I agree with the above.

## 2022-01-03 NOTE — Assessment & Plan Note (Signed)
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, margins negative, 4/7 lymph nodes, ER 75 to 95%, PR 9095%, HER2 negative, Ki-67 25% Genetics: Negative MammaPrint: Low risk (this is not a valid test for 4+ lymph nodes)  Treatment plan: 1.Adjuvant chemotherapy with dose dense Adriamycin and Cytoxan x4 followed by Taxol weekly x12started 08/25/2021 2.adjuvant radiation therapy 3.Followed by adjuvant antiestrogen therapy with complete estrogen blockade and abemaciclib ------------------------------------------------------------------------------------------------------------------------ Current treatment:Completed 4 cycles ofdose dense Adriamycin and Cytoxan, today cycle10Taxol 08/18/2021: Echocardiogram: EF 55 to 60%  Chemo toxicities: 1.Mild fatigue 2. 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  She might require dose reduction if it continues to persist.  She denies any nausea or vomiting. She is usingcompression stockings as a form of prevention of neuropathy.   Diabetes: Appears to be under reasonable control.. Repeat prognostic panel on the lymph node in the final pathology also came back as ER/PR positive Breast expander removal surgery: Patient might consider doing a DIEP flap in the future.  Return to clinic weekly for Taxol and in 2 weeks she will come for her 12th and final cycle of Taxol.

## 2022-01-04 ENCOUNTER — Inpatient Hospital Stay: Payer: 59

## 2022-01-04 ENCOUNTER — Other Ambulatory Visit: Payer: Self-pay | Admitting: Hematology and Oncology

## 2022-01-04 ENCOUNTER — Inpatient Hospital Stay: Payer: 59 | Admitting: Hematology and Oncology

## 2022-01-04 ENCOUNTER — Other Ambulatory Visit: Payer: Self-pay

## 2022-01-04 ENCOUNTER — Encounter: Payer: Self-pay | Admitting: *Deleted

## 2022-01-04 VITALS — BP 106/72 | HR 98 | Temp 98.3°F | Resp 18

## 2022-01-04 DIAGNOSIS — C50811 Malignant neoplasm of overlapping sites of right female breast: Secondary | ICD-10-CM

## 2022-01-04 DIAGNOSIS — Z170 Estrogen receptor positive status [ER+]: Secondary | ICD-10-CM | POA: Diagnosis not present

## 2022-01-04 DIAGNOSIS — Z95828 Presence of other vascular implants and grafts: Secondary | ICD-10-CM

## 2022-01-04 LAB — CBC WITH DIFFERENTIAL (CANCER CENTER ONLY)
Abs Immature Granulocytes: 0.01 10*3/uL (ref 0.00–0.07)
Basophils Absolute: 0 10*3/uL (ref 0.0–0.1)
Basophils Relative: 1 %
Eosinophils Absolute: 0.1 10*3/uL (ref 0.0–0.5)
Eosinophils Relative: 3 %
HCT: 32.5 % — ABNORMAL LOW (ref 36.0–46.0)
Hemoglobin: 10.3 g/dL — ABNORMAL LOW (ref 12.0–15.0)
Immature Granulocytes: 0 %
Lymphocytes Relative: 33 %
Lymphs Abs: 1.2 10*3/uL (ref 0.7–4.0)
MCH: 24.9 pg — ABNORMAL LOW (ref 26.0–34.0)
MCHC: 31.7 g/dL (ref 30.0–36.0)
MCV: 78.5 fL — ABNORMAL LOW (ref 80.0–100.0)
Monocytes Absolute: 0.2 10*3/uL (ref 0.1–1.0)
Monocytes Relative: 5 %
Neutro Abs: 2.1 10*3/uL (ref 1.7–7.7)
Neutrophils Relative %: 58 %
Platelet Count: 292 10*3/uL (ref 150–400)
RBC: 4.14 MIL/uL (ref 3.87–5.11)
RDW: 16.4 % — ABNORMAL HIGH (ref 11.5–15.5)
WBC Count: 3.5 10*3/uL — ABNORMAL LOW (ref 4.0–10.5)
nRBC: 0 % (ref 0.0–0.2)

## 2022-01-04 LAB — CMP (CANCER CENTER ONLY)
ALT: 23 U/L (ref 0–44)
AST: 22 U/L (ref 15–41)
Albumin: 4.3 g/dL (ref 3.5–5.0)
Alkaline Phosphatase: 56 U/L (ref 38–126)
Anion gap: 8 (ref 5–15)
BUN: 10 mg/dL (ref 6–20)
CO2: 25 mmol/L (ref 22–32)
Calcium: 9.1 mg/dL (ref 8.9–10.3)
Chloride: 104 mmol/L (ref 98–111)
Creatinine: 0.48 mg/dL (ref 0.44–1.00)
GFR, Estimated: >60 mL/min (ref >60)
Glucose, Bld: 153 mg/dL — ABNORMAL HIGH (ref 70–99)
Potassium: 3.9 mmol/L (ref 3.5–5.1)
Sodium: 137 mmol/L (ref 135–145)
Total Bilirubin: 0.5 mg/dL (ref 0.3–1.2)
Total Protein: 6.7 g/dL (ref 6.5–8.1)

## 2022-01-04 MED ORDER — DIPHENHYDRAMINE HCL 50 MG/ML IJ SOLN
25.0000 mg | Freq: Once | INTRAMUSCULAR | Status: AC
  Administered 2022-01-04: 25 mg via INTRAVENOUS
  Filled 2022-01-04: qty 1

## 2022-01-04 MED ORDER — SODIUM CHLORIDE 0.9 % IV SOLN: Freq: Once | INTRAVENOUS | Status: AC

## 2022-01-04 MED ORDER — HEPARIN SOD (PORK) LOCK FLUSH 100 UNIT/ML IV SOLN
500.0000 [IU] | Freq: Once | INTRAVENOUS | Status: AC | PRN
  Administered 2022-01-04: 500 [IU]

## 2022-01-04 MED ORDER — FAMOTIDINE 20 MG IN NS 100 ML IVPB
20.0000 mg | Freq: Once | INTRAVENOUS | Status: AC
  Administered 2022-01-04: 20 mg via INTRAVENOUS
  Filled 2022-01-04: qty 100

## 2022-01-04 MED ORDER — SODIUM CHLORIDE 0.9% FLUSH
10.0000 mL | Freq: Once | INTRAVENOUS | Status: AC
  Administered 2022-01-04: 10 mL

## 2022-01-04 MED ORDER — DEXAMETHASONE SODIUM PHOSPHATE 10 MG/ML IJ SOLN
4.0000 mg | Freq: Once | INTRAMUSCULAR | Status: AC
  Administered 2022-01-04: 4 mg via INTRAVENOUS
  Filled 2022-01-04: qty 1

## 2022-01-04 MED ORDER — SODIUM CHLORIDE 0.9 % IV SOLN: Freq: Once | INTRAVENOUS | Status: DC

## 2022-01-04 MED ORDER — SODIUM CHLORIDE 0.9 % IV SOLN
200.0000 mg | Freq: Once | INTRAVENOUS | Status: AC
  Administered 2022-01-04: 200 mg via INTRAVENOUS
  Filled 2022-01-04: qty 200

## 2022-01-04 MED ORDER — SODIUM CHLORIDE 0.9 % IV SOLN
80.0000 mg/m2 | Freq: Once | INTRAVENOUS | Status: AC
  Administered 2022-01-04: 144 mg via INTRAVENOUS
  Filled 2022-01-04: qty 24

## 2022-01-04 MED ORDER — SODIUM CHLORIDE 0.9% FLUSH
10.0000 mL | INTRAVENOUS | Status: DC | PRN
  Administered 2022-01-04: 10 mL

## 2022-01-04 NOTE — Progress Notes (Signed)
Orders for today:  Venofer 200 mg IVPB today with treatment, then starting next week Venofer 300 mg IVPB weekly x 2 doses.  T.O. Dr Jannifer Hick, PharmD

## 2022-01-04 NOTE — Progress Notes (Signed)
Pt monitored for 30 min post venofer 200mg  infusion. No complains. VSS.

## 2022-01-05 ENCOUNTER — Telehealth: Payer: Self-pay | Admitting: Radiation Oncology

## 2022-01-05 NOTE — Telephone Encounter (Signed)
Called patient to schedule consultation with Dr. Squire. No answer, LVM for return call. 

## 2022-01-09 ENCOUNTER — Ambulatory Visit: Payer: 59

## 2022-01-09 ENCOUNTER — Other Ambulatory Visit: Payer: Self-pay

## 2022-01-09 DIAGNOSIS — R293 Abnormal posture: Secondary | ICD-10-CM

## 2022-01-09 DIAGNOSIS — C50811 Malignant neoplasm of overlapping sites of right female breast: Secondary | ICD-10-CM | POA: Diagnosis not present

## 2022-01-09 DIAGNOSIS — M25611 Stiffness of right shoulder, not elsewhere classified: Secondary | ICD-10-CM

## 2022-01-09 DIAGNOSIS — Z170 Estrogen receptor positive status [ER+]: Secondary | ICD-10-CM

## 2022-01-09 NOTE — Therapy (Signed)
[facility] @ [address] [place], [place], [address] Phone: [phone]   Fax:  [phone]  Physical Therapy Treatment  Patient Details  Name: Allison Reed MRN: [id number] Date of Birth: 1976/10/20 Referring Provider (PT): Dr. Donne Hazel   Encounter Date: 01/09/2022   PT End of Session - 01/09/22 1600     Visit Number 18    Number of Visits 23    Date for PT Re-Evaluation 02/12/22    PT Start Time 4827   pt late   PT Stop Time 1558    PT Time Calculation (min) 46 min    Activity Tolerance Patient tolerated treatment well    Behavior During Therapy [facility] for tasks assessed/performed             Past Medical History:  Diagnosis Date   Diabetes mellitus without complication ([facility])    Type 2   Family history of brain tumor    Family history of skin cancer    Gestational diabetes    Headache    otc med prn   Heartburn in pregnancy    Incompetent cervix in pregnancy 04/2015   Missed abortion    no surgery required   Postpartum care following cesarean delivery (11/2) 10/12/2015   right breast ca 06/2021   UTI in pregnancy 03/2015   recent UTI, treatment completed    Past Surgical History:  Procedure Laterality Date   APPENDECTOMY  2006   BREAST RECONSTRUCTION WITH PLACEMENT OF TISSUE EXPANDER AND ALLODERM Right 07/25/2021   Procedure: RIGHT BREAST RECONSTRUCTION WITH PLACEMENT OF TISSUE EXPANDER AND ALLODERM;  Surgeon: Irene Limbo, MD;  Location: [facility];  Service: Plastics;  Laterality: Right;   CERCLAGE LAPAROSCOPIC ABDOMINAL N/A 04/15/2015   Procedure: LAPAROSCOPIC TRANSABDOMINAL CERVCOISTHMIC CERCLAGE;  Surgeon: Governor Specking, MD;  Location: [facility];  Service: Gynecology;  Laterality: N/A;   CERVICAL CERCLAGE  02/2011   vaginal   CESAREAN SECTION N/A 10/12/2015   Procedure: CESAREAN SECTION;  Surgeon: Princess Bruins, MD;  Location: [facility];  Service: Obstetrics;  Laterality: N/A;    CESAREAN SECTION N/A 03/03/2018   Procedure: Repeat CESAREAN SECTION/Removal of Abdominal Cerclage;  Surgeon: Azucena Fallen, MD;  Location: [facility];  Service: Obstetrics;  Laterality: N/A;  EDD: 03/28/18   MASTECTOMY W/ SENTINEL NODE BIOPSY Right 07/25/2021   Procedure: RIGHT MASTECTOMY WITH AXILLARY SENTINEL LYMPH NODE BIOPSY;  Surgeon: [name], MD;  Location: [facility];  Service: General;  Laterality: Right;   PORTACATH PLACEMENT N/A 08/24/2021   Procedure: INSERTION PORT-A-CATH;  Surgeon: [name], MD;  Location: [facility];  Service: General;  Laterality: N/A;   RADIOACTIVE SEED GUIDED AXILLARY SENTINEL LYMPH NODE Right 07/25/2021   Procedure: RADIOACTIVE SEED GUIDED RIGHT AXILLARY NODE EXCISION;  Surgeon: [name], MD;  Location: [facility];  Service: General;  Laterality: Right;   TISSUE EXPANDER PLACEMENT Right 12/04/2021   Procedure: REMOVAL RIGHT CHEST TISSUE EXPANDER;  Surgeon: Irene Limbo, MD;  Location: [facility];  Service: Plastics;  Laterality: Right;   WISDOM TOOTH EXTRACTION  2001    There were no vitals filed for this visit.   Subjective Assessment - 01/09/22 1512     Subjective The forearm is still tight but the upper arm is feeling better.  I got 2 compression bras and 2 sports bra and a prosthesis. I am going to [facility] on Saturday to get the sleeve and gauntlet. The back of my  arm is feeling weird.  Can we measure it.    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 She noted a palpable right breast mass. This is followed by a mammogram with a mass with calcifications as really throughout the upper outer quadrant and in the central breast. This measures 9.4 x 8.7 x 6.7 cm. On ultrasound she had 3 separate masses at 10, 11, and 12:00. The sizes are 1.7, 2.1, and 2.2 cm. There is also a single abnormal axillary lymph node that has undergone biopsy. 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 She is s/p right breast mastectomy with SLNB 0/7 LN on August 16 andl also had tissue expanders in place.  She is currently undergoing chemotherapy for 6 months every other week and radiation will follow; emergency sugery to remove expander 12/04/21    Patient Stated Goals Reassess after surgery    Pain Score 0-No pain    Multiple Pain Sites No                   LYMPHEDEMA/ONCOLOGY QUESTIONNAIRE - 01/09/22 0001       Right Upper Extremity Lymphedema   15 cm Proximal to Olecranon Process 27.4 cm    10 cm Proximal to Olecranon Process 25.4 cm    Olecranon Process 25.2 cm    15 cm Proximal to Ulnar Styloid Process 23.7 cm    10 cm Proximal to Ulnar Styloid Process 21 cm    Just Proximal to Ulnar Styloid Process 15.3 cm    At Base of 2nd Digit 5.7 cm      Left Upper Extremity Lymphedema   15 cm Proximal to Olecranon Process 27.7 cm    10 cm Proximal to Olecranon Process 25.2 cm    Olecranon Process 24.6 cm    15 cm Proximal to Ulnar Styloid Process 22.8 cm    10 cm Proximal to Ulnar Styloid Process 20 cm    Just Proximal to Ulnar Styloid Process 15.1 cm    At Base of 2nd Digit 5.9 cm                        [facility] Adult PT Treatment/Exercise - 01/09/22 0001       Shoulder Exercises: Supine   Other Supine Exercises supine alphabet x 1      Shoulder Exercises: Standing   Other Standing Exercises standing ball stabs x 20 up/down, side to side      Manual Therapy   Manual Therapy Myofascial release;Passive ROM    Myofascial Release MFR to right medial upper arm, axilla, and forearm areas of cording.    Passive ROM  In supine into Rt shoulder flexion, abduction and D2 flexion; pt with full end motions except still can feel and see pulling from cording                          PT Long Term Goals - 01/01/22 1515       PT LONG TERM GOAL #1   Title Pt will achieve full right shoulder ROM post surgery for full return  to daily activities    Baseline nearly met    Time 6    Period Weeks    Status On-going    Target Date 02/12/22      PT LONG TERM GOAL #2   Title Pt will improve right shoulder flexion and scaption by atleast 15 degrees    Time 6    Period Weeks    Status Achieved    Target Date 01/01/22      PT LONG TERM GOAL #3   Title Pt will report decrease achiness and pulling from cording by 50% or more    Baseline 95% better    Time 6    Period Weeks    Status Achieved    Target Date 01/01/22      PT LONG TERM GOAL #4   Title Pt will be able to perform arm movements to continue with her indian dancing    Baseline shoulder ROM is likely good for [place] dancing, but she is still fatigued from chemo. 3 more left    Time 6    Period Weeks    Status Partially Met    Target Date 02/12/22      PT LONG TERM GOAL #5   Title Pt  will progress through ABC strength class or other scapular/shoulder strengthening    Period Weeks    Status New    Target Date 02/12/22                   Plan - 01/09/22 1601     Clinical Impression Statement Pts arm remeasured secondary to odd sensation in posterior arm and pt concerned with swelling.  both arms slightly larger but does not appear to be lymphedema.  Continued MFR to cording in the right UE and PROM while performing MFR.  Cords are still present but not nearly as visible or palpable.  Pt performed scapular stab exs and continues to fatigue very quickly.    Personal Factors and Comorbidities Comorbidity 1    Comorbidities right breast cancer s/p mastectomy; presently having chemo and pending radiation; recent emergency  surgery to remove expander    Stability/Clinical Decision Making Stable/Uncomplicated    Rehab Potential Excellent    PT Frequency 1x / week    PT Duration 6 weeks    PT Treatment/Interventions ADLs/Self Care Home Management;Therapeutic exercise;Manual techniques;Patient/family education;Manual lymph drainage;Scar mobilization;Passive range of motion;Moist Heat    PT Next Visit Plan decrease to 1x/week, MFR for cording, MLD prn, ABC strength, scapular strength, getting measured for sleeve Feb 3, check sleeve when she gets    PT Home Exercise Plan 4 post op exs , supine wand flexion and scaption; modified downward dog on wall, supine scapular series    Consulted and Agree with Plan of Care Patient             Patient will benefit from skilled therapeutic intervention in order to improve the following deficits and impairments:  Postural dysfunction, Decreased knowledge of precautions, Decreased scar mobility, Increased fascial restricitons, Decreased range of motion  Visit Diagnosis: Malignant neoplasm of overlapping sites of right breast in female, estrogen receptor positive ([facility])  Stiffness of right shoulder, not elsewhere classified  Abnormal posture     Problem List Patient Active Problem List   Diagnosis Date Noted   Breast cancer, right ([facility]) 12/04/2021   Port-A-Cath in place 08/25/2021   S/P mastectomy, right 07/25/2021   Genetic testing 07/01/2021   Cancer ([facility]) 06/26/2021   Family history  of skin cancer 06/22/2021   Family history of brain tumor 06/22/2021   Malignant neoplasm of overlapping sites of right breast in female, estrogen receptor positive ([facility]) 06/21/2021   Premature uterine contractions causing threatened premature labor in third trimester 03/03/2018   Previous cesarean delivery, delivered 03/03/2018   Postpartum care following cesarean delivery (3/25) 03/03/2018   Status post repeat low transverse cesarean section 03/03/2018   DM (diabetes mellitus) in  pregnancy 03/02/2018   Incompetent cervix in pregnancy 03/02/2018   Premature uterine contractions in third trimester, antepartum 03/02/2018   Active labor 10/12/2015    Claris Pong, PT 01/09/2022, 5:18 PM  [facility] @ [address] [place], [place], [address] Phone: [phone]   Fax:  [phone]  Name: CHELSIA SERRES MRN: [id number] Date of Birth: June 17, 1976

## 2022-01-12 ENCOUNTER — Inpatient Hospital Stay: Payer: 59

## 2022-01-12 ENCOUNTER — Other Ambulatory Visit: Payer: Self-pay

## 2022-01-12 ENCOUNTER — Inpatient Hospital Stay: Payer: 59 | Attending: Hematology and Oncology

## 2022-01-12 VITALS — BP 106/78 | HR 95 | Temp 98.4°F | Resp 18 | Wt 155.2 lb

## 2022-01-12 DIAGNOSIS — Z9011 Acquired absence of right breast and nipple: Secondary | ICD-10-CM | POA: Diagnosis not present

## 2022-01-12 DIAGNOSIS — R5383 Other fatigue: Secondary | ICD-10-CM | POA: Diagnosis not present

## 2022-01-12 DIAGNOSIS — E119 Type 2 diabetes mellitus without complications: Secondary | ICD-10-CM | POA: Insufficient documentation

## 2022-01-12 DIAGNOSIS — M549 Dorsalgia, unspecified: Secondary | ICD-10-CM | POA: Insufficient documentation

## 2022-01-12 DIAGNOSIS — Z95828 Presence of other vascular implants and grafts: Secondary | ICD-10-CM

## 2022-01-12 DIAGNOSIS — C50811 Malignant neoplasm of overlapping sites of right female breast: Secondary | ICD-10-CM | POA: Insufficient documentation

## 2022-01-12 DIAGNOSIS — Z5111 Encounter for antineoplastic chemotherapy: Secondary | ICD-10-CM | POA: Insufficient documentation

## 2022-01-12 DIAGNOSIS — Z170 Estrogen receptor positive status [ER+]: Secondary | ICD-10-CM | POA: Insufficient documentation

## 2022-01-12 DIAGNOSIS — G620 Drug-induced polyneuropathy: Secondary | ICD-10-CM | POA: Insufficient documentation

## 2022-01-12 DIAGNOSIS — D649 Anemia, unspecified: Secondary | ICD-10-CM | POA: Insufficient documentation

## 2022-01-12 LAB — CBC WITH DIFFERENTIAL (CANCER CENTER ONLY)
Abs Immature Granulocytes: 0.02 10*3/uL (ref 0.00–0.07)
Basophils Absolute: 0 10*3/uL (ref 0.0–0.1)
Basophils Relative: 1 %
Eosinophils Absolute: 0.1 10*3/uL (ref 0.0–0.5)
Eosinophils Relative: 2 %
HCT: 34.1 % — ABNORMAL LOW (ref 36.0–46.0)
Hemoglobin: 10.9 g/dL — ABNORMAL LOW (ref 12.0–15.0)
Immature Granulocytes: 1 %
Lymphocytes Relative: 27 %
Lymphs Abs: 0.9 10*3/uL (ref 0.7–4.0)
MCH: 25.3 pg — ABNORMAL LOW (ref 26.0–34.0)
MCHC: 32 g/dL (ref 30.0–36.0)
MCV: 79.3 fL — ABNORMAL LOW (ref 80.0–100.0)
Monocytes Absolute: 0.2 10*3/uL (ref 0.1–1.0)
Monocytes Relative: 7 %
Neutro Abs: 2.1 10*3/uL (ref 1.7–7.7)
Neutrophils Relative %: 62 %
Platelet Count: 286 10*3/uL (ref 150–400)
RBC: 4.3 MIL/uL (ref 3.87–5.11)
RDW: 17.4 % — ABNORMAL HIGH (ref 11.5–15.5)
WBC Count: 3.3 10*3/uL — ABNORMAL LOW (ref 4.0–10.5)
nRBC: 0 % (ref 0.0–0.2)

## 2022-01-12 LAB — CMP (CANCER CENTER ONLY)
ALT: 24 U/L (ref 0–44)
AST: 19 U/L (ref 15–41)
Albumin: 4.3 g/dL (ref 3.5–5.0)
Alkaline Phosphatase: 59 U/L (ref 38–126)
Anion gap: 9 (ref 5–15)
BUN: 10 mg/dL (ref 6–20)
CO2: 25 mmol/L (ref 22–32)
Calcium: 9.4 mg/dL (ref 8.9–10.3)
Chloride: 105 mmol/L (ref 98–111)
Creatinine: 0.54 mg/dL (ref 0.44–1.00)
GFR, Estimated: >60 mL/min (ref >60)
Glucose, Bld: 196 mg/dL — ABNORMAL HIGH (ref 70–99)
Potassium: 4.1 mmol/L (ref 3.5–5.1)
Sodium: 139 mmol/L (ref 135–145)
Total Bilirubin: 0.5 mg/dL (ref 0.3–1.2)
Total Protein: 6.7 g/dL (ref 6.5–8.1)

## 2022-01-12 MED ORDER — SODIUM CHLORIDE 0.9% FLUSH
10.0000 mL | Freq: Once | INTRAVENOUS | Status: AC
  Administered 2022-01-12: 10 mL

## 2022-01-12 MED ORDER — SODIUM CHLORIDE 0.9 % IV SOLN
300.0000 mg | Freq: Once | INTRAVENOUS | Status: AC
  Administered 2022-01-12: 300 mg via INTRAVENOUS
  Filled 2022-01-12: qty 300

## 2022-01-12 MED ORDER — DEXAMETHASONE SODIUM PHOSPHATE 10 MG/ML IJ SOLN
4.0000 mg | Freq: Once | INTRAMUSCULAR | Status: AC
  Administered 2022-01-12: 4 mg via INTRAVENOUS
  Filled 2022-01-12: qty 1

## 2022-01-12 MED ORDER — SODIUM CHLORIDE 0.9% FLUSH
10.0000 mL | INTRAVENOUS | Status: DC | PRN
  Administered 2022-01-12: 10 mL

## 2022-01-12 MED ORDER — DIPHENHYDRAMINE HCL 50 MG/ML IJ SOLN
25.0000 mg | Freq: Once | INTRAMUSCULAR | Status: AC
  Administered 2022-01-12: 25 mg via INTRAVENOUS
  Filled 2022-01-12: qty 1

## 2022-01-12 MED ORDER — SODIUM CHLORIDE 0.9 % IV SOLN
80.0000 mg/m2 | Freq: Once | INTRAVENOUS | Status: AC
  Administered 2022-01-12: 144 mg via INTRAVENOUS
  Filled 2022-01-12: qty 24

## 2022-01-12 MED ORDER — FAMOTIDINE IN NACL 20-0.9 MG/50ML-% IV SOLN
20.0000 mg | Freq: Once | INTRAVENOUS | Status: AC
  Administered 2022-01-12: 20 mg via INTRAVENOUS
  Filled 2022-01-12: qty 50

## 2022-01-12 MED ORDER — HEPARIN SOD (PORK) LOCK FLUSH 100 UNIT/ML IV SOLN
500.0000 [IU] | Freq: Once | INTRAVENOUS | Status: AC | PRN
  Administered 2022-01-12: 500 [IU]

## 2022-01-12 MED ORDER — SODIUM CHLORIDE 0.9 % IV SOLN: Freq: Once | INTRAVENOUS | Status: AC

## 2022-01-12 MED ORDER — SODIUM CHLORIDE 0.9 % IV SOLN: Freq: Once | INTRAVENOUS | Status: DC

## 2022-01-12 NOTE — Patient Instructions (Signed)
[facility]  Discharge Instructions: Thank you for choosing [facility] to provide your oncology and hematology care.   If you have a lab appointment with the [facility], please go directly to the [facility] and check in at the registration area.   Wear comfortable clothing and clothing appropriate for easy access to any Portacath or PICC line.   We strive to give you quality time with your provider. You may need to reschedule your appointment if you arrive late (15 or more minutes).  Arriving late affects you and other patients whose appointments are after yours.  Also, if you miss three or more appointments without notifying the office, you may be dismissed from the clinic at the providers discretion.      For prescription refill requests, have your pharmacy contact our office and allow 72 hours for refills to be completed.    Today you received the following chemotherapy and/or immunotherapy agents: Taxol and Venofer   To help prevent nausea and vomiting after your treatment, we encourage you to take your nausea medication as directed.  BELOW ARE SYMPTOMS THAT SHOULD BE REPORTED IMMEDIATELY: *FEVER GREATER THAN 100.4 F (38 C) OR HIGHER *CHILLS OR SWEATING *NAUSEA AND VOMITING THAT IS NOT CONTROLLED WITH YOUR NAUSEA MEDICATION *UNUSUAL SHORTNESS OF BREATH *UNUSUAL BRUISING OR BLEEDING *URINARY PROBLEMS (pain or burning when urinating, or frequent urination) *BOWEL PROBLEMS (unusual diarrhea, constipation, pain near the anus) TENDERNESS IN MOUTH AND THROAT WITH OR WITHOUT PRESENCE OF ULCERS (sore throat, sores in mouth, or a toothache) UNUSUAL RASH, SWELLING OR PAIN  UNUSUAL VAGINAL DISCHARGE OR ITCHING   Items with * indicate a potential emergency and should be followed up as soon as possible or go to the Emergency Department if any problems should occur.  Please show the CHEMOTHERAPY ALERT CARD or IMMUNOTHERAPY ALERT CARD at  check-in to the Emergency Department and triage nurse.  Should you have questions after your visit or need to cancel or reschedule your appointment, please contact [facility]  Dept: [phone]  and follow the prompts.  Office hours are 8:00 a.m. to 4:30 p.m. Monday - Friday. Please note that voicemails left after 4:00 p.m. may not be returned until the following business day.  We are closed weekends and major holidays. You have access to a nurse at all times for urgent questions. Please call the main number to the clinic Dept: [phone] and follow the prompts.   For any non-urgent questions, you may also contact your provider using MyChart. We now offer e-Visits for anyone 56 and older to request care online for non-urgent symptoms. For details visit [email or web address].   Also download the MyChart app! Go to the app store, search "MyChart", open the app, select [facility], and log in with your MyChart username and password.  Due to Covid, a mask is required upon entering the hospital/clinic. If you do not have a mask, one will be given to you upon arrival. For doctor visits, patients may have 1 support person aged 1 or older with them. For treatment visits, patients cannot have anyone with them due to current Covid guidelines and our immunocompromised population.

## 2022-01-12 NOTE — Progress Notes (Signed)
Patient tolerated IV iron infusion well, declined to stay for 30 minute post observation period. VSS, ambulatory to lobby.

## 2022-01-17 ENCOUNTER — Ambulatory Visit: Payer: 59 | Attending: Plastic Surgery

## 2022-01-17 ENCOUNTER — Other Ambulatory Visit: Payer: Self-pay

## 2022-01-17 VITALS — Wt 155.1 lb

## 2022-01-17 DIAGNOSIS — M25611 Stiffness of right shoulder, not elsewhere classified: Secondary | ICD-10-CM | POA: Diagnosis present

## 2022-01-17 DIAGNOSIS — C50811 Malignant neoplasm of overlapping sites of right female breast: Secondary | ICD-10-CM | POA: Diagnosis not present

## 2022-01-17 DIAGNOSIS — Z170 Estrogen receptor positive status [ER+]: Secondary | ICD-10-CM | POA: Insufficient documentation

## 2022-01-17 DIAGNOSIS — R293 Abnormal posture: Secondary | ICD-10-CM | POA: Insufficient documentation

## 2022-01-17 NOTE — Therapy (Addendum)
[facility] @ [address] [place], [place], [address] Phone: [phone]   Fax:  [phone]  Physical Therapy Treatment  Patient Details  Name: Allison Reed MRN: [id number] Date of Birth: Aug 21, 1976 Referring Provider (PT): Dr. Donne Hazel   Encounter Date: 01/17/2022   PT End of Session - 01/17/22 0854     Visit Number 19    Number of Visits 23    Date for PT Re-Evaluation 02/12/22    PT Start Time 0804    PT Stop Time 5465   pt reports feeling good and not needing to stay for entire session   PT Time Calculation (min) 43 min    Activity Tolerance Patient tolerated treatment well    Behavior During Therapy [facility] for tasks assessed/performed             Past Medical History:  Diagnosis Date   Diabetes mellitus without complication ([place])    Type 2   Family history of brain tumor    Family history of skin cancer    Gestational diabetes    Headache    otc med prn   Heartburn in pregnancy    Incompetent cervix in pregnancy 04/2015   Missed abortion    no surgery required   Postpartum care following cesarean delivery (11/2) 10/12/2015   right breast ca 06/2021   UTI in pregnancy 03/2015   recent UTI, treatment completed    Past Surgical History:  Procedure Laterality Date   APPENDECTOMY  2006   BREAST RECONSTRUCTION WITH PLACEMENT OF TISSUE EXPANDER AND ALLODERM Right 07/25/2021   Procedure: RIGHT BREAST RECONSTRUCTION WITH PLACEMENT OF TISSUE EXPANDER AND ALLODERM;  Surgeon: Irene Limbo, MD;  Location: [facility];  Service: Plastics;  Laterality: Right;   CERCLAGE LAPAROSCOPIC ABDOMINAL N/A 04/15/2015   Procedure: LAPAROSCOPIC TRANSABDOMINAL CERVCOISTHMIC CERCLAGE;  Surgeon: Governor Specking, MD;  Location: [facility];  Service: Gynecology;  Laterality: N/A;   CERVICAL CERCLAGE  02/2011   vaginal   CESAREAN SECTION N/A 10/12/2015   Procedure: CESAREAN SECTION;  Surgeon: Princess Bruins, MD;   Location: [facility];  Service: Obstetrics;  Laterality: N/A;   CESAREAN SECTION N/A 03/03/2018   Procedure: Repeat CESAREAN SECTION/Removal of Abdominal Cerclage;  Surgeon: Azucena Fallen, MD;  Location: [facility];  Service: Obstetrics;  Laterality: N/A;  EDD: 03/28/18   MASTECTOMY W/ SENTINEL NODE BIOPSY Right 07/25/2021   Procedure: RIGHT MASTECTOMY WITH AXILLARY SENTINEL LYMPH NODE BIOPSY;  Surgeon: [name], MD;  Location: [facility];  Service: General;  Laterality: Right;   PORTACATH PLACEMENT N/A 08/24/2021   Procedure: INSERTION PORT-A-CATH;  Surgeon: [name], MD;  Location: [facility];  Service: General;  Laterality: N/A;   RADIOACTIVE SEED GUIDED AXILLARY SENTINEL LYMPH NODE Right 07/25/2021   Procedure: RADIOACTIVE SEED GUIDED RIGHT AXILLARY NODE EXCISION;  Surgeon: [name], MD;  Location: [facility];  Service: General;  Laterality: Right;   TISSUE EXPANDER PLACEMENT Right 12/04/2021   Procedure: REMOVAL RIGHT CHEST TISSUE EXPANDER;  Surgeon: Irene Limbo, MD;  Location: [facility];  Service: Plastics;  Laterality: Right;   WISDOM TOOTH EXTRACTION  2001    Vitals:   01/17/22 0903  Weight: 155 lb 2 oz (70.4 kg)     Subjective Assessment - 01/17/22 0817     Subjective I got my compression sleeves and gauntlets. I wore my sleeve for the first time yesterday for about 4 hours and it was comfortable but tight,  I'll need to get used to it. I couldn't wear the gauntlet as long because it felt like it was squeezing my hand a little. My cording feels much better and I don't need any more appts after today. Also my last chemo is this week and then I have my radiation simulation in about 2 weeks.    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 She noted a palpable right breast mass. This is followed by a mammogram with a mass with calcifications as really throughout the upper outer quadrant  and in the central breast. This measures 9.4 x 8.7 x 6.7 cm. On ultrasound she had 3 separate masses at 10, 11, and 12:00. The sizes are 1.7, 2.1, and 2.2 cm. There is also a single abnormal axillary lymph node that has undergone biopsy. 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 She is s/p right breast mastectomy with SLNB 0/7 LN on August 16 andl also had tissue expanders in place.  She is currently undergoing chemotherapy for 6 months every other week and radiation will follow; emergency sugery to remove expander 12/04/21    Patient Stated Goals Reassess after surgery    Currently in Pain? No/denies                    L-DEX FLOWSHEETS - 01/17/22 0800       L-DEX LYMPHEDEMA SCREENING   Measurement Type Unilateral    L-DEX MEASUREMENT EXTREMITY Upper Extremity    POSITION  Standing    DOMINANT SIDE Right    At Risk Side Right    BASELINE SCORE (UNILATERAL) 0    L-DEX SCORE (UNILATERAL) 3.7    VALUE CHANGE (UNILAT) 3.7                       [facility] Adult PT Treatment/Exercise - 01/17/22 0001       Self-Care   Self-Care Other Self-Care Comments    Other Self-Care Comments  Instructed pt verbally and using the packet handouts that were issued today in the Strength ABC Program. She was familiar with most of the exercises but did instruct her in the "low and slow" progression of weights and she was able to verbalize good understanding of this. Also educated her about daily compression wear and that it's ok if she needs to slowly increase wear time as she increases her tolerance. She should though, exchange her gauntlet if after a few days to a week of wear  this doesn't start to feel more comfortable as she reports she felt it was squeezing her hand and was not as comfortable as the sleeve.      Manual Therapy   Manual Therapy Myofascial release    Soft tissue mobilization In Supine to Rt upper trap and suboccipital release as she reports this area has been feeling tighter as she has been working to increase her hours at work.    Myofascial Release Briefly to Rt axilla in end P/ROM, also briefly assessed Rt forearm but pt reports no tightness felt with stretching  PT Long Term Goals - 01/01/22 1515       PT LONG TERM GOAL #1   Title Pt will achieve full right shoulder ROM post surgery for full return to daily activities    Baseline nearly met    Time 6    Period Weeks    Status On-going    Target Date 02/12/22      PT LONG TERM GOAL #2   Title Pt will improve right shoulder flexion and scaption by atleast 15 degrees    Time 6    Period Weeks    Status Achieved    Target Date 01/01/22      PT LONG TERM GOAL #3   Title Pt will report decrease achiness and pulling from cording by 50% or more    Baseline 95% better    Time 6    Period Weeks    Status Achieved    Target Date 01/01/22      PT LONG TERM GOAL #4   Title Pt will be able to perform arm movements to continue with her indian dancing    Baseline shoulder ROM is likely good for [place] dancing, but she is still fatigued from chemo. 3 more left    Time 6    Period Weeks    Status Partially Met    Target Date 02/12/22      PT LONG TERM GOAL #5   Title Pt  will progress through ABC strength class or other scapular/shoulder strengthening    Period Weeks    Status New    Target Date 02/12/22                   Plan - 01/17/22 0854     Clinical Impression Statement Pt comes in reporting feelingmuch improved and would like to cancel upcoming appts at this time. Educated her about compression sleeve/gauntlet that it is normal  that she may need to increase wear time slowly to increase her tolerance. However, also instructe dher that if the gauntlet doesn't start to feel more comfortable that she should exchange it for another size as this should feel tight but comfortable. She verbalized good understanding of this. Verbally, and by use of handouts, educated her on the Strength ABC program along with educating her about the "low and slow" progression with resistance which she was [place] able to verbalize a good understanding of. Briefly performed manual therapy but pt reported no longer feeling a stretch with this so ended session early after doing her 3 month SOZO screen. Her baseline change of 3.7 is WNLs so R/S her to return for this in 3 months. Pt did schedule one more session in late March to assess how her ROM and cording is doing at that time but pt knows she can cancel this if she conts to feel she is doing well at that time.    Personal Factors and Comorbidities Comorbidity 1    Comorbidities right breast cancer s/p mastectomy; presently having chemo and pending radiation; recent emergency surgery to remove expander    Stability/Clinical Decision Making Stable/Uncomplicated    Rehab Potential Excellent    PT Frequency 1x / week    PT Duration 6 weeks    PT Treatment/Interventions ADLs/Self Care Home Management;Therapeutic exercise;Manual techniques;Patient/family education;Manual lymph drainage;Scar mobilization;Passive range of motion;Moist Heat    PT Next Visit Plan Pt on hold until 02/26/22 for reassess during radiation, though if she is doing well she will cancel this. Pt will  cont every 3 month L-Dex screens for up to 2 years from her SLNB (~07/27/2023)    PT Home Exercise Plan 4 post op exs , supine wand flexion and scaption; modified downward dog on wall, supine scapular series; Strength ABC program    Consulted and Agree with Plan of Care Patient             Patient will benefit from skilled therapeutic  intervention in order to improve the following deficits and impairments:  Postural dysfunction, Decreased knowledge of precautions, Decreased scar mobility, Increased fascial restricitons, Decreased range of motion  Visit Diagnosis: Malignant neoplasm of overlapping sites of right breast in female, estrogen receptor positive ([place])  Stiffness of right shoulder, not elsewhere classified  Abnormal posture     Problem List Patient Active Problem List   Diagnosis Date Noted   Breast cancer, right ([place]) 12/04/2021   Port-A-Cath in place 08/25/2021   S/P mastectomy, right 07/25/2021   Genetic testing 07/01/2021   Cancer ([place]) 06/26/2021   Family history of skin cancer 06/22/2021   Family history of brain tumor 06/22/2021   Malignant neoplasm of overlapping sites of right breast in female, estrogen receptor positive ([place]) 06/21/2021   Premature uterine contractions causing threatened premature labor in third trimester 03/03/2018   Previous cesarean delivery, delivered 03/03/2018   Postpartum care following cesarean delivery (3/25) 03/03/2018   Status post repeat low transverse cesarean section 03/03/2018   DM (diabetes mellitus) in pregnancy 03/02/2018   Incompetent cervix in pregnancy 03/02/2018   Premature uterine contractions in third trimester, antepartum 03/02/2018   Active labor 10/12/2015    Otelia Limes, PTA 01/17/2022, 9:03 AM  [facility] @ [address] [place], [place], [address] Phone: [phone]   Fax:  [phone]  Name: Allison Reed MRN: [id number] Date of Birth: 08/22/1976

## 2022-01-19 ENCOUNTER — Inpatient Hospital Stay: Payer: 59 | Admitting: Adult Health

## 2022-01-19 ENCOUNTER — Encounter: Payer: Self-pay | Admitting: Adult Health

## 2022-01-19 ENCOUNTER — Other Ambulatory Visit: Payer: Self-pay

## 2022-01-19 ENCOUNTER — Inpatient Hospital Stay (HOSPITAL_BASED_OUTPATIENT_CLINIC_OR_DEPARTMENT_OTHER): Payer: 59

## 2022-01-19 ENCOUNTER — Encounter: Payer: Self-pay | Admitting: *Deleted

## 2022-01-19 ENCOUNTER — Inpatient Hospital Stay: Payer: 59

## 2022-01-19 VITALS — BP 112/84 | HR 99 | Resp 17

## 2022-01-19 VITALS — BP 106/70 | HR 100 | Temp 97.5°F | Resp 16 | Ht 67.0 in | Wt 157.3 lb

## 2022-01-19 DIAGNOSIS — Z170 Estrogen receptor positive status [ER+]: Secondary | ICD-10-CM

## 2022-01-19 DIAGNOSIS — Z95828 Presence of other vascular implants and grafts: Secondary | ICD-10-CM

## 2022-01-19 DIAGNOSIS — C50811 Malignant neoplasm of overlapping sites of right female breast: Secondary | ICD-10-CM

## 2022-01-19 LAB — CBC WITH DIFFERENTIAL (CANCER CENTER ONLY)
Abs Immature Granulocytes: 0.02 10*3/uL (ref 0.00–0.07)
Basophils Absolute: 0 10*3/uL (ref 0.0–0.1)
Basophils Relative: 1 %
Eosinophils Absolute: 0.1 10*3/uL (ref 0.0–0.5)
Eosinophils Relative: 3 %
HCT: 34.4 % — ABNORMAL LOW (ref 36.0–46.0)
Hemoglobin: 11.1 g/dL — ABNORMAL LOW (ref 12.0–15.0)
Immature Granulocytes: 1 %
Lymphocytes Relative: 36 %
Lymphs Abs: 1.2 10*3/uL (ref 0.7–4.0)
MCH: 25.5 pg — ABNORMAL LOW (ref 26.0–34.0)
MCHC: 32.3 g/dL (ref 30.0–36.0)
MCV: 78.9 fL — ABNORMAL LOW (ref 80.0–100.0)
Monocytes Absolute: 0.2 10*3/uL (ref 0.1–1.0)
Monocytes Relative: 6 %
Neutro Abs: 1.7 10*3/uL (ref 1.7–7.7)
Neutrophils Relative %: 53 %
Platelet Count: 275 10*3/uL (ref 150–400)
RBC: 4.36 MIL/uL (ref 3.87–5.11)
RDW: 18.4 % — ABNORMAL HIGH (ref 11.5–15.5)
WBC Count: 3.2 10*3/uL — ABNORMAL LOW (ref 4.0–10.5)
nRBC: 0 % (ref 0.0–0.2)

## 2022-01-19 LAB — CMP (CANCER CENTER ONLY)
ALT: 27 U/L (ref 0–44)
AST: 21 U/L (ref 15–41)
Albumin: 4.3 g/dL (ref 3.5–5.0)
Alkaline Phosphatase: 58 U/L (ref 38–126)
Anion gap: 9 (ref 5–15)
BUN: 10 mg/dL (ref 6–20)
CO2: 25 mmol/L (ref 22–32)
Calcium: 9.3 mg/dL (ref 8.9–10.3)
Chloride: 103 mmol/L (ref 98–111)
Creatinine: 0.47 mg/dL (ref 0.44–1.00)
GFR, Estimated: >60 mL/min (ref >60)
Glucose, Bld: 219 mg/dL — ABNORMAL HIGH (ref 70–99)
Potassium: 3.8 mmol/L (ref 3.5–5.1)
Sodium: 137 mmol/L (ref 135–145)
Total Bilirubin: 0.4 mg/dL (ref 0.3–1.2)
Total Protein: 6.6 g/dL (ref 6.5–8.1)

## 2022-01-19 MED ORDER — SODIUM CHLORIDE 0.9 % IV SOLN
80.0000 mg/m2 | Freq: Once | INTRAVENOUS | Status: AC
  Administered 2022-01-19: 144 mg via INTRAVENOUS
  Filled 2022-01-19: qty 24

## 2022-01-19 MED ORDER — HEPARIN SOD (PORK) LOCK FLUSH 100 UNIT/ML IV SOLN
500.0000 [IU] | Freq: Once | INTRAVENOUS | Status: AC | PRN
  Administered 2022-01-19: 500 [IU]

## 2022-01-19 MED ORDER — DIPHENHYDRAMINE HCL 50 MG/ML IJ SOLN
25.0000 mg | Freq: Once | INTRAMUSCULAR | Status: AC
  Administered 2022-01-19: 25 mg via INTRAVENOUS
  Filled 2022-01-19: qty 1

## 2022-01-19 MED ORDER — FAMOTIDINE IN NACL 20-0.9 MG/50ML-% IV SOLN
20.0000 mg | Freq: Once | INTRAVENOUS | Status: AC
  Administered 2022-01-19: 20 mg via INTRAVENOUS
  Filled 2022-01-19: qty 50

## 2022-01-19 MED ORDER — DEXAMETHASONE SODIUM PHOSPHATE 10 MG/ML IJ SOLN
4.0000 mg | Freq: Once | INTRAMUSCULAR | Status: AC
  Administered 2022-01-19: 4 mg via INTRAVENOUS
  Filled 2022-01-19: qty 1

## 2022-01-19 MED ORDER — SODIUM CHLORIDE 0.9 % IV SOLN: Freq: Once | INTRAVENOUS | Status: AC

## 2022-01-19 MED ORDER — SODIUM CHLORIDE 0.9% FLUSH
10.0000 mL | INTRAVENOUS | Status: DC | PRN
  Administered 2022-01-19: 10 mL

## 2022-01-19 MED ORDER — SODIUM CHLORIDE 0.9 % IV SOLN
300.0000 mg | Freq: Once | INTRAVENOUS | Status: AC
  Administered 2022-01-19: 300 mg via INTRAVENOUS
  Filled 2022-01-19: qty 300

## 2022-01-19 NOTE — Patient Instructions (Addendum)
[facility]  Discharge Instructions: Thank you for choosing [facility] to provide your oncology and hematology care.   If you have a lab appointment with the [facility], please go directly to the [facility] and check in at the registration area.   Wear comfortable clothing and clothing appropriate for easy access to any Portacath or PICC line.   We strive to give you quality time with your provider. You may need to reschedule your appointment if you arrive late (15 or more minutes).  Arriving late affects you and other patients whose appointments are after yours.  Also, if you miss three or more appointments without notifying the office, you may be dismissed from the clinic at the providers discretion.      For prescription refill requests, have your pharmacy contact our office and allow 72 hours for refills to be completed.    Today you received the following chemotherapy and/or immunotherapy agents: Paclitaxel      To help prevent nausea and vomiting after your treatment, we encourage you to take your nausea medication as directed.  BELOW ARE SYMPTOMS THAT SHOULD BE REPORTED IMMEDIATELY: *FEVER GREATER THAN 100.4 F (38 C) OR HIGHER *CHILLS OR SWEATING *NAUSEA AND VOMITING THAT IS NOT CONTROLLED WITH YOUR NAUSEA MEDICATION *UNUSUAL SHORTNESS OF BREATH *UNUSUAL BRUISING OR BLEEDING *URINARY PROBLEMS (pain or burning when urinating, or frequent urination) *BOWEL PROBLEMS (unusual diarrhea, constipation, pain near the anus) TENDERNESS IN MOUTH AND THROAT WITH OR WITHOUT PRESENCE OF ULCERS (sore throat, sores in mouth, or a toothache) UNUSUAL RASH, SWELLING OR PAIN  UNUSUAL VAGINAL DISCHARGE OR ITCHING   Items with * indicate a potential emergency and should be followed up as soon as possible or go to the Emergency Department if any problems should occur.  Please show the CHEMOTHERAPY ALERT CARD or IMMUNOTHERAPY ALERT CARD at check-in to  the Emergency Department and triage nurse.  Should you have questions after your visit or need to cancel or reschedule your appointment, please contact [facility]  Dept: [phone]  and follow the prompts.  Office hours are 8:00 a.m. to 4:30 p.m. Monday - Friday. Please note that voicemails left after 4:00 p.m. may not be returned until the following business day.  We are closed weekends and major holidays. You have access to a nurse at all times for urgent questions. Please call the main number to the clinic Dept: [phone] and follow the prompts.   For any non-urgent questions, you may also contact your provider using MyChart. We now offer e-Visits for anyone 34 and older to request care online for non-urgent symptoms. For details visit [email or web address].   Also download the MyChart app! Go to the app store, search "MyChart", open the app, select [facility], and log in with your MyChart username and password.  Due to Covid, a mask is required upon entering the hospital/clinic. If you do not have a mask, one will be given to you upon arrival. For doctor visits, patients may have 1 support person aged 59 or older with them. For treatment visits, patients cannot have anyone with them due to current Covid guidelines and our immunocompromised population.   Iron Sucrose Injection What is this medication? IRON SUCROSE (EYE ern SOO krose) treats low levels of iron (iron deficiency anemia) in people with kidney disease. Iron is a mineral that plays an important role in making red blood cells, which carry oxygen from your lungs to the rest  of your body. This medicine may be used for other purposes; ask your health care provider or pharmacist if you have questions. COMMON BRAND NAME(S): Venofer What should I tell my care team before I take this medication? They need to know if you have any of these conditions: Anemia not caused by low iron levels Heart  disease High levels of iron in the blood Kidney disease Liver disease An unusual or allergic reaction to iron, other medications, foods, dyes, or preservatives Pregnant or trying to get pregnant Breast-feeding How should I use this medication? This medication is for infusion into a vein. It is given in a hospital or clinic setting. Talk to your care team about the use of this medication in children. While this medication may be prescribed for children as young as 2 years for selected conditions, precautions do apply. Overdosage: If you think you have taken too much of this medicine contact a poison control center or emergency room at once. NOTE: This medicine is only for you. Do not share this medicine with others. What if I miss a dose? It is important not to miss your dose. Call your care team if you are unable to keep an appointment. What may interact with this medication? Do not take this medication with any of the following: Deferoxamine Dimercaprol Other iron products This medication may also interact with the following: Chloramphenicol Deferasirox This list may not describe all possible interactions. Give your health care provider a list of all the medicines, herbs, non-prescription drugs, or dietary supplements you use. Also tell them if you smoke, drink alcohol, or use illegal drugs. Some items may interact with your medicine. What should I watch for while using this medication? Visit your care team regularly. Tell your care team if your symptoms do not start to get better or if they get worse. You may need blood work done while you are taking this medication. You may need to follow a special diet. Talk to your care team. Foods that contain iron include: whole grains/cereals, dried fruits, beans, or peas, leafy green vegetables, and organ meats (liver, kidney). What side effects may I notice from receiving this medication? Side effects that you should report to your care team as  soon as possible: Allergic reactions--skin rash, itching, hives, swelling of the face, lips, tongue, or throat Low blood pressure--dizziness, feeling faint or lightheaded, blurry vision Shortness of breath Side effects that usually do not require medical attention (report to your care team if they continue or are bothersome): Flushing Headache Joint pain Muscle pain Nausea Pain, redness, or irritation at injection site This list may not describe all possible side effects. Call your doctor for medical advice about side effects. You may report side effects to FDA at 1-800-FDA-1088. Where should I keep my medication? This medication is given in a hospital or clinic and will not be stored at home. NOTE: This sheet is a summary. It may not cover all possible information. If you have questions about this medicine, talk to your doctor, pharmacist, or health care provider.  2022 Elsevier/Gold Standard (2021-04-21 00:00:00)

## 2022-01-19 NOTE — Assessment & Plan Note (Signed)
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, margins negative, 4/7 lymph nodes, ER 75 to 95%, PR 9095%, HER2 negative, Ki-67 25% °Genetics: Negative °MammaPrint: Low risk (this is not a valid test for 4+ lymph nodes) °  °Treatment plan: °1.  Adjuvant chemotherapy with dose dense Adriamycin and Cytoxan x4 followed by Taxol weekly x12 started 08/25/2021 °2. adjuvant radiation therapy °3.  Followed by adjuvant antiestrogen therapy with complete estrogen blockade and abemaciclib °------------------------------------------------------------------------------------------------------------------------ °Current treatment: Completed 4 cycles of dose dense Adriamycin and Cytoxan, today cycle 12 Taxol °08/18/2021: Echocardiogram: EF 55 to 60% °  °Chemo toxicities: °1.  Mild fatigue °2. mild peripheral neuropathy of the tips of her toes: Resolved today. °3.  Back pain: Likely secondary to Taxol and intermittent in nature will monitor.  Will image if it worsens. ° °Numeta is doing well with her treatment.  She will proceed with her final Taxol today.  She will see Dr. Squire on February 24.  I have placed a request for her to get scheduled in the next 6 to 8 weeks for follow-up with Dr. Gudena.  I reviewed with her that after Dr. Gudena's appointment she will receive a survivorship care plan visit which will be in approximately 4 months. °  ° °

## 2022-01-19 NOTE — Progress Notes (Signed)
Pt declined to stay for 30 min wait post iron. Discharged with [facility].

## 2022-01-19 NOTE — Progress Notes (Signed)
[facility] Follow up:    Allison Bowen, MD [place] [address]   DIAGNOSIS:  Cancer Staging  Malignant neoplasm of overlapping sites of right breast in female, estrogen receptor positive ([place]) Staging form: Breast, AJCC 8th Edition - Clinical stage from 06/21/2021: Stage IIA (cT3, cN1, cM0, G2, ER+, PR+, HER2-) - Signed by Allison Lose, MD on 06/21/2021 Histologic grading system: 3 grade system - Pathologic: Stage IB (pT1c, pN2a(sn), cM0, G2, ER+, PR+, HER2-) - Unsigned Method of lymph node assessment: Sentinel lymph node biopsy Histologic grading system: 3 grade system   SUMMARY OF ONCOLOGIC HISTORY: Oncology History  Malignant neoplasm of overlapping sites of right breast in female, estrogen receptor positive ([place])  06/16/2021 Initial Diagnosis   Palpable right breast mass: Breast MRI revealed extensive involvement of the right breast with mass and non-mass enhancement superior right breast mass measures 4.9 x 1.2 cm and together with non-mass enhancement measured 8.8 cm, enhancing mass LOQ 1.3 cm, left breast indeterminate 0.9 cm mass and a 0.8 cm mass UOQ, single abnormal lymph node Biopsy 10:00, 11:00 and 12:00: IDC with DCIS, biopsy right axillary lymph node: IDC, ER 75 to 95%, PR 90 to 95%, Ki-67 25%, HER2 negative on 1 biopsy and 2+ by IHC and FISH pending   06/21/2021 Cancer Staging   Staging form: Breast, AJCC 8th Edition - Clinical stage from 06/21/2021: Stage IIA (cT3, cN1, cM0, G2, ER+, PR+, HER2-) - Signed by Allison Lose, MD on 06/21/2021 Histologic grading system: 3 grade system    07/01/2021 Genetic Testing   Negative genetic testing:  No pathogenic variants detected on the [facility] BRCAplus panel (report date 07/01/2021) or the [facility] CancerNext-Expanded + RNAinsight panel (report date 07/01/2021).   The BRCAplus panel offered by [employer] and includes sequencing and deletion/duplication analysis for the following 8 genes: ATM,  BRCA1, BRCA2, CDH1, CHEK2, PALB2, PTEN, and TP53. The CancerNext-Expanded + RNAinsight gene panel offered by [employer] and includes sequencing and rearrangement analysis for the following 77 genes: AIP, ALK, APC, ATM, AXIN2, BAP1, BARD1, BLM, BMPR1A, BRCA1, BRCA2, BRIP1, CDC73, CDH1, CDK4, CDKN1B, CDKN2A, CHEK2, CTNNA1, DICER1, FANCC, FH, FLCN, GALNT12, KIF1B, LZTR1, MAX, MEN1, MET, MLH1, MSH2, MSH3, MSH6, MUTYH, NBN, NF1, NF2, NTHL1, PALB2, PHOX2B, PMS2, POT1, PRKAR1A, PTCH1, PTEN, RAD51C, RAD51D, RB1, RECQL, RET, SDHA, SDHAF2, SDHB, SDHC, SDHD, SMAD4, SMARCA4, SMARCB1, SMARCE1, STK11, SUFU, TMEM127, TP53, TSC1, TSC2, VHL and XRCC2 (sequencing and deletion/duplication); EGFR, EGLN1, HOXB13, KIT, MITF, PDGFRA, POLD1 and POLE (sequencing only); EPCAM and GREM1 (deletion/duplication only). RNA data is routinely analyzed for use in variant interpretation for all genes.   07/25/2021 Surgery   Right mastectomy: Foci of invasive ductal carcinoma measuring 1.1 cm grade 2 with extensive DCIS spanning 8.9 cm, margins negative, 4/7 lymph nodes, ER 75 to 95%, PR 9095%, HER2 negative, Ki-67 25%   08/25/2021 - 01/19/2022 Adjuvant Chemotherapy   Adjuvant chemotherapy: dose dense AC x 4 followed by weekly Taxol x 12.      CURRENT THERAPY: Weekly chemotherapy with Taxol  INTERVAL HISTORY: Allison Reed 46 y.o. female returns for follow-up prior to receiving the final cycle of her adjuvant Taxol chemotherapy.  She has tolerated this regimen quite well thus far.  She is mildly fatigued.  She has gone back to work.  She denies any peripheral neuropathy.  She notes that she does get some upper back pain a few days after receiving her treatment.  Please note that she also is receiving IV iron  for iron deficiency on the day of her Taxol treatment.  She has an appointment with Dr. Camelia Reed scheduled for February 24.  They will discuss her adjuvant radiation therapy plans.   Patient Active Problem List   Diagnosis Date  Noted   Port-A-Cath in place 08/25/2021   S/P mastectomy, right 07/25/2021   Genetic testing 07/01/2021   Family history of skin cancer 06/22/2021   Family history of brain tumor 06/22/2021   Malignant neoplasm of overlapping sites of right breast in female, estrogen receptor positive ([place]) 06/21/2021   Status post repeat low transverse cesarean section 03/03/2018   DM (diabetes mellitus) in pregnancy 03/02/2018   Incompetent cervix in pregnancy 03/02/2018    is allergic to betadine [povidone iodine] and tape.  MEDICAL HISTORY: Past Medical History:  Diagnosis Date   Diabetes mellitus without complication ([place])    Type 2   Family history of brain tumor    Family history of skin cancer    Gestational diabetes    Headache    otc med prn   Heartburn in pregnancy    Incompetent cervix in pregnancy 04/2015   Missed abortion    no surgery required   Postpartum care following cesarean delivery (11/2) 10/12/2015   right breast ca 06/2021   UTI in pregnancy 03/2015   recent UTI, treatment completed    SURGICAL HISTORY: Past Surgical History:  Procedure Laterality Date   APPENDECTOMY  2006   BREAST RECONSTRUCTION WITH PLACEMENT OF TISSUE EXPANDER AND ALLODERM Right 07/25/2021   Procedure: RIGHT BREAST RECONSTRUCTION WITH PLACEMENT OF TISSUE EXPANDER AND ALLODERM;  Surgeon: Allison Limbo, MD;  Location: [facility];  Service: Plastics;  Laterality: Right;   CERCLAGE LAPAROSCOPIC ABDOMINAL N/A 04/15/2015   Procedure: LAPAROSCOPIC TRANSABDOMINAL CERVCOISTHMIC CERCLAGE;  Surgeon: Allison Specking, MD;  Location: [facility];  Service: Gynecology;  Laterality: N/A;   CERVICAL CERCLAGE  02/2011   vaginal   CESAREAN SECTION N/A 10/12/2015   Procedure: CESAREAN SECTION;  Surgeon: Allison Bruins, MD;  Location: [facility];  Service: Obstetrics;  Laterality: N/A;   CESAREAN SECTION N/A 03/03/2018   Procedure: Repeat CESAREAN SECTION/Removal of Abdominal Cerclage;  Surgeon: Allison Fallen, MD;  Location: [facility];  Service: Obstetrics;  Laterality: N/A;  EDD: 03/28/18   MASTECTOMY W/ SENTINEL NODE BIOPSY Right 07/25/2021   Procedure: RIGHT MASTECTOMY WITH AXILLARY SENTINEL LYMPH NODE BIOPSY;  Surgeon: Allison Reed Bookbinder, MD;  Location: [facility];  Service: General;  Laterality: Right;   PORTACATH PLACEMENT N/A 08/24/2021   Procedure: INSERTION PORT-A-CATH;  Surgeon: Allison Reed Bookbinder, MD;  Location: [facility];  Service: General;  Laterality: N/A;   RADIOACTIVE SEED GUIDED AXILLARY SENTINEL LYMPH NODE Right 07/25/2021   Procedure: RADIOACTIVE SEED GUIDED RIGHT AXILLARY NODE EXCISION;  Surgeon: Allison Reed Bookbinder, MD;  Location: [place];  Service: General;  Laterality: Right;   TISSUE EXPANDER PLACEMENT Right 12/04/2021   Procedure: REMOVAL RIGHT CHEST TISSUE EXPANDER;  Surgeon: Allison Limbo, MD;  Location: [facility];  Service: Plastics;  Laterality: Right;   WISDOM TOOTH EXTRACTION  2001    SOCIAL HISTORY: Social History   Socioeconomic History   Marital status: Married    Spouse name: Dr [name]   Number of children: Not on file   Years of education: Not on file   Highest education level: Not on file  Occupational History   Not on file  Tobacco Use   Smoking status: Never   Smokeless tobacco: Never  Vaping  Use   Vaping Use: Never used  Substance and Sexual Activity   Alcohol use: Yes    Comment: social   Drug use: No   Sexual activity: Yes    Birth control/protection: None  Other Topics Concern   Not on file  Social History Narrative   Not on file   Social Determinants of Health   Financial Resource Strain: Not on file  Food Insecurity: Not on file  Transportation Needs: Not on file  Physical Activity: Not on file  Stress: Not on file  Social Connections: Not on file  Intimate Partner Violence: Not on file    FAMILY HISTORY: Family History  Problem Relation Age of Onset    Diabetes Father    HIV Maternal Uncle    Skin cancer Maternal Uncle 39   HIV Maternal Uncle    Diabetes Maternal Grandmother    Hypertension Maternal Grandmother    Alzheimer's disease Maternal Grandmother    Cirrhosis Maternal Grandfather    Alcoholism Maternal Grandfather    Other Paternal Grandfather        brain hemorrhage    Review of Systems  Constitutional:  Positive for fatigue (Mild). Negative for appetite change, chills, fever and unexpected weight change.  HENT:   Negative for hearing loss, lump/mass and trouble swallowing.   Eyes:  Negative for eye problems and icterus.  Respiratory:  Negative for chest tightness, cough and shortness of breath.   Cardiovascular:  Negative for chest pain, leg swelling and palpitations.  Gastrointestinal:  Negative for abdominal distention, abdominal pain, constipation, diarrhea, nausea and vomiting.  Endocrine: Negative for hot flashes.  Genitourinary:  Negative for difficulty urinating.   Musculoskeletal:  Positive for back pain (As per interval history). Negative for arthralgias.  Skin:  Negative for itching and rash.  Neurological:  Negative for dizziness, extremity weakness, headaches and numbness.  Hematological:  Negative for adenopathy. Does not bruise/bleed easily.  Psychiatric/Behavioral:  Negative for depression. The patient is not nervous/anxious.      PHYSICAL EXAMINATION  ECOG PERFORMANCE STATUS: 1 - Symptomatic but completely ambulatory  Vitals:   01/19/22 0834  BP: 106/70  Pulse: 100  Resp: 16  Temp: (!) 97.5 F (36.4 C)  SpO2: 99%    Physical Exam Constitutional:      General: She is not in acute distress.    Appearance: Normal appearance. She is not toxic-appearing.  HENT:     Head: Normocephalic and atraumatic.  Eyes:     General: No scleral icterus. Cardiovascular:     Rate and Rhythm: Normal rate and regular rhythm.     Pulses: Normal pulses.     Heart sounds: Normal heart sounds.  Pulmonary:      Effort: Pulmonary effort is normal.     Breath sounds: Normal breath sounds.  Abdominal:     General: Abdomen is flat. Bowel sounds are normal. There is no distension.     Palpations: Abdomen is soft.     Tenderness: There is no abdominal tenderness.  Musculoskeletal:        General: No swelling.     Cervical back: Neck supple.  Lymphadenopathy:     Cervical: No cervical adenopathy.  Skin:    General: Skin is warm and dry.     Findings: No rash.  Neurological:     General: No focal deficit present.     Mental Status: She is alert.  Psychiatric:        Mood and Affect: Mood normal.  Behavior: Behavior normal.    LABORATORY DATA:  CBC    Component Value Date/Time   WBC 3.2 (L) 01/19/2022 0826   WBC 8.5 03/04/2018 0526   RBC 4.36 01/19/2022 0826   HGB 11.1 (L) 01/19/2022 0826   HCT 34.4 (L) 01/19/2022 0826   PLT 275 01/19/2022 0826   MCV 78.9 (L) 01/19/2022 0826   MCH 25.5 (L) 01/19/2022 0826   MCHC 32.3 01/19/2022 0826   RDW 18.4 (H) 01/19/2022 0826   LYMPHSABS 1.2 01/19/2022 0826   MONOABS 0.2 01/19/2022 0826   EOSABS 0.1 01/19/2022 0826   BASOSABS 0.0 01/19/2022 0826    CMP     Component Value Date/Time   NA 137 01/19/2022 0826   K 3.8 01/19/2022 0826   CL 103 01/19/2022 0826   CO2 25 01/19/2022 0826   GLUCOSE 219 (H) 01/19/2022 0826   BUN 10 01/19/2022 0826   CREATININE 0.47 01/19/2022 0826   CALCIUM 9.3 01/19/2022 0826   PROT 6.6 01/19/2022 0826   ALBUMIN 4.3 01/19/2022 0826   AST 21 01/19/2022 0826   ALT 27 01/19/2022 0826   ALKPHOS 58 01/19/2022 0826   BILITOT 0.4 01/19/2022 0826   GFRNONAA >60 01/19/2022 0826   GFRAA >60 10/11/2017 1150       ASSESSMENT and THERAPY PLAN:   Malignant neoplasm of overlapping sites of right breast in female, estrogen receptor positive ([place]) 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, margins negative, 4/7 lymph nodes, ER 75 to  95%, PR 9095%, HER2 negative, Ki-67 25% Genetics: Negative MammaPrint: Low risk (this is not a valid test for 4+ lymph nodes)   Treatment plan: 1.  Adjuvant chemotherapy with dose dense Adriamycin and Cytoxan x4 followed by Taxol weekly x12 started 08/25/2021 2. adjuvant radiation therapy 3.  Followed by adjuvant antiestrogen therapy with complete estrogen blockade and abemaciclib ------------------------------------------------------------------------------------------------------------------------ Current treatment: Completed 4 cycles of dose dense Adriamycin and Cytoxan, today cycle 12 Taxol 08/18/2021: Echocardiogram: EF 55 to 60%   Chemo toxicities: 1.  Mild fatigue 2. mild peripheral neuropathy of the tips of her toes: Resolved today. 3.  Back pain: Likely secondary to Taxol and intermittent in nature will monitor.  Will image if it worsens.  Numeta is doing well with her treatment.  She will proceed with her final Taxol today.  She will see Dr. Isidore Moos on February 24.  I have placed a request for her to get scheduled in the next 6 to 8 weeks for follow-up with Dr. Lindi Adie.  I reviewed with her that after Dr. Geralyn Flash appointment she will receive a survivorship care plan visit which will be in approximately 4 months.     All questions were answered. The patient knows to call the clinic with any problems, questions or concerns. We can certainly see the patient much sooner if necessary.  Total encounter time: 30 minutes in face-to-face visit time, chart review, lab review, care coordination, order entry, and documentation of the encounter.  Wilber Bihari, NP 01/19/22 9:16 AM Medical Oncology and Hematology [facility] [place], [place] [address] Tel. [phone]    Fax. [phone]  *Total Encounter Time as defined by the Centers for Medicare and Medicaid Services includes, in addition to the face-to-face time of a patient visit (documented in the note  above) non-face-to-face time: obtaining and reviewing outside history, ordering and reviewing medications, tests or procedures, care coordination (communications with other health care professionals  or caregivers) and documentation in the medical record.

## 2022-01-22 ENCOUNTER — Ambulatory Visit: Payer: 59

## 2022-01-26 ENCOUNTER — Other Ambulatory Visit: Payer: 59

## 2022-01-26 ENCOUNTER — Ambulatory Visit: Payer: 59

## 2022-02-01 NOTE — Progress Notes (Signed)
Location of Breast Cancer:  Malignant neoplasm of overlapping sites of right breast in female, estrogen receptor positive  Histology per Pathology Report:  07/25/2021 FINAL MICROSCOPIC DIAGNOSIS:  A. LYMPH NODE, RIGHT AXILLARY, SENTINEL, EXCISION:  - Metastatic carcinoma to a lymph node (1/1)  - Metastatic carcinoma measures 1.4 cm in greatest linear dimension  - No evidence of extranodal extension  B. LYMPH NODE, RIGHT AXILLARY, SENTINEL, EXCISION:  - Lymph node, negative for carcinoma (0/1)  C. LYMPH NODE, RIGHT AXILLARY, SENTINEL, EXCISION:  - Lymph node, negative for carcinoma (0/1)  D. LYMPH NODE, RIGHT AXILLARY, SENTINEL, EXCISION:  - Metastatic carcinoma to a lymph node (1/1)  - Metastatic carcinoma measures 0.5 cm in greatest linear dimension  - No evidence of extranodal extension  E. LYMPH NODE, RIGHT AXILLARY, SENTINEL, EXCISION:  - Metastatic carcinoma to a lymph node (1/1)  - Metastatic carcinoma measures 0.8 cm in greatest linear dimension  - No evidence of extranodal extension  F. LYMPH NODE, RIGHT AXILLARY, SENTINEL, EXCISION:  - Metastatic carcinoma to a lymph node (1/1)  - Metastatic carcinoma measures 0.2 cm in greatest linear dimension  - No evidence of extranodal extension  G. LYMPH NODE, RIGHT AXILLARY, SENTINEL, EXCISION:  - Lymph node, negative for carcinoma (0/1)  H. BREAST, RIGHT, MASTECTOMY:  - Foci of invasive ductal carcinoma, grade 2, arising in a background of extensive ductal carcinoma in situ, intermediate to high-grade with calcifications, spanning an area of 8.9 cm.  See comment  - Resection margins negative for invasive carcinoma  - DCIS is less than 1 mm from the anterior margin over a broad area and  focally less than 1 mm from the posterior margin  - Biopsy site changes  - See oncology table  COMMENT:  H.   Foci of invasive carcinoma show somewhat different histomorphology in different areas, with some showing mucinous features.  The largest  contiguous focus of invasive carcinoma measures 1.1 cm in greatest linear dimension.  Receptor Status: ER(75-90%), PR (90-95%), Her2-neu (Negative via FISH), Ki-67(25%)  Did patient present with symptoms (if so, please note symptoms) or was this found on screening mammography?: Palpable right breast mass: Breast MRI revealed extensive involvement of the right breast with mass and non-mass enhancement superior right breast mass measures 4.9 x 1.2 cm and together with non-mass enhancement measured 8.8 cm, enhancing mass LOQ 1.3 cm, left breast indeterminate 0.9 cm mass and a 0.8 cm mass UOQ, single abnormal lymph node  Past/Anticipated interventions by surgeon, if any:  12/04/2022 --Dr. Irene Limbo (Plastic Surgeon) Removal right chest tissue expander  07/25/2021 --Dr. [name] 1.  Right skin sparing mastectomy 2.  Right axillary node radioactive seed guided excision 3.  Right axillary sentinel lymph node biopsy --Dr. Irene Limbo (Plastic Surgeon) Right breast reconstruction with tissue expander Acellular dermis ([employer]) to right chest 300 cm2  Past/Anticipated interventions by medical oncology, if any:  Under care of Dr. [name] 01/19/2022 (office note of Annabelle Harman) --Treatment plan: Adjuvant chemotherapy with dose dense Adriamycin and Cytoxan x4 followed by Taxol weekly x12 started 08/25/2021 Adjuvant radiation therapy Followed by adjuvant antiestrogen therapy with complete estrogen blockade and abemaciclib --Current treatment:  Completed 4 cycles of dose dense Adriamycin and Cytoxan, today cycle 12 Taxol 08/18/2021: Echocardiogram: EF 55 to 60%  She will proceed with her final Taxol today.   She will see Dr. Isidore Moos on February 24.   I have placed a request for her to get scheduled in the next 6 to 8  weeks for follow-up with Dr. Lindi Adie.   I reviewed with her that after Dr. Geralyn Flash appointment she will receive a survivorship care plan visit which will  be in approximately 4 months.  Lymphedema issues, if any:  Yes--has a compression sleeve; had final PT session on 01/17/2022    Pain issues, if any:  Patient denies   SAFETY ISSUES: Prior radiation? No Pacemaker/ICD? No Possible current pregnancy? No--LMP: ~12/24/2021 Is the patient on methotrexate? No  Current Complaints / other details:  Has a small area of swelling to her left posterior lower leg that she is concerned about (reports it was present before she started systemic therapy, but went away while she received chemotherapy, but has returned since she finished treatment)

## 2022-02-01 NOTE — Progress Notes (Signed)
Radiation Oncology         [phone] ________________________________  Initial Outpatient Consultation  Name: Allison Reed MRN: [id number]  Date: 02/02/2022  DOB: September 14, 1976  DX:AJOIN, Allison Main, MD  [name], MD   REFERRING PHYSICIAN: [name], MD  DIAGNOSIS:    ICD-10-CM   1. Malignant neoplasm of overlapping sites of right breast in female, estrogen receptor positive ([place])  C50.811 Pregnancy, urine   Z17.0     2. Lower leg mass, left  R22.42 DG Tibia/Fibula Left      Cancer Staging  Malignant neoplasm of overlapping sites of right breast in female, estrogen receptor positive ([place]) Staging form: Breast, AJCC 8th Edition - Clinical stage from 06/21/2021: Stage IIA (cT3, cN1, cM0, G2, ER+, PR+, HER2-) - Signed by [name], MD on 06/21/2021 Histologic grading system: 3 grade system - Pathologic: Stage IB (pT1c, pN2a(sn), cM0, G2, ER+, PR+, HER2-) - Unsigned Method of lymph node assessment: Sentinel lymph node biopsy Histologic grading system: 3 grade system   S/p right mastectomy: S/p right mastectomy and adjuvant chemotherapy   CHIEF COMPLAINT: Here to discuss management of right breast cancer  HISTORY OF PRESENT ILLNESS::Allison Reed is a 46 y.o. female initially presented with a palpable lump in her right breast. Subsequent diagnostic mammogram and ultrasound on 06/08/21 showed irregular hypoechoic lesions in the right breast at the 10 o'clock position, 8 cm from nipple, corresponding with the palpable area measuring 2.7 x 1.7 x 1.1 cm. Another mass at the 11 o'clock position, 6 cm from nipple, measuring 1.6 x 0.8 x 2.1 cm, and a 12 o'clock mass, 8 cm from nipple, measuring 1.9 x 0.4 x 2.2 cm. A single abnormal right axillary lymph node was also appreciated without normal cortex.   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 Right axillary biopsy also showed invasive ductal carcinoma. Prognostic indicators for 10-12 o'clock biopsies showed: ER status 75% positive with strong staining intensity; PR status 90% positive with moderate-strong staining intensity; Proliferation marker Ki67 at 25%; Her2 status negative; Grade 2. Prognostic indicators for right axillary biopsy showed: ER and PR status both 95% positive with strong staining intensity; Proliferation marker Ki67 at 25%; Her2 status negative.   MammaPrint testing collected on 06/16/21 showed findings consistent with Low-risk luminal type-A.  Bilateral breast MRI on 06/18/21 demonstrated extensive disease in the right breast, with both mass and non mass enhancement predominantly involving the majority of the superior and upper outer right breast, with relative sparing of the nlower inner quadrant. No evidence of definite involvement of the skin, nipple-areolar complex, or pectoralis muscle appreciated. MRI also showed a single abnormal right axillary lymph node, and indeterminate 0.8 and 0.9 cm enhancing masses in the outer left breast.  An additional biopsy of the central right breast collected on 06/19/21 revealed high- grade ductal carcinoma in-situ with necrosis and calcifications. ER status 95% positive, PR status 50% positive, both with strong staining intensity.    Genetic testing performed on 06/21/21 showed no clinically significant variants detected by BRCAplus or +RNAinsight testing.   Subsequently, the patient was referred to Dr. Lindi Adie on 06/21/21 for further evaluation and to discuss treatment options. Given her MammaPrint results, Dr. Lindi Adie recommended the patient to proceed with right mastectomy, following by XRT, and adjuvant antiestrogen therapy with CDK inhibitor abemaciclib. Given that the  patient had several weeks until surgery at the time, Dr. Lindi Adie also recommended that the patient begin tamoxifen  on the date of this visit.   CT of the chest abdomen and pelvis on 06/26/21 demonstrated: a sclerotic lesion of the medial left clavicle, at the time noted as likely from oligometastatic disease to the skeleton, and less likely from benign condensing osteitis of the clavicle; asymmetric soft tissue densities along the glandular tissues of the right breast; a right axillary lymph node measuring 0.9 cm in short axis; a left subpectoral lymph node measuring 0.8 cm in short axis; and a likely benign cyst in the right ovary measuring 3.1 cm in the long axis.  Left breast ultrasound on 06/29/21 demonstrated an indeterminate 1 cm hypoechoic mass in the left breast, 2 o'clock axis, 5 cm from nipple, corresponding to 1 of the masses seen on MRI.  Given findings on MRI and CT, a left breast 2 o'clock biopsy was performed on 06/29/21 which showed fibrocystic changes and no evidence of malignancy.  PET on 07/13/21 demonstrated mildly hypermetabolic right breast primary with right axillary nodal metastasis. No hypermetabolism was seen corresponding with the left medial clavicular sclerotic lesion seen on CT from 06/26/21 (lesion noted as likely degenerative in etiology).  The patient opted to proceed with right mastectomy and SNL excisions on 07/25/21 under the care of Dr. Donne Hazel. Pathology from the procedure revealed: tumor and calcifications spanning an area of 8.9 cm; histology of a foci of grade 2 invasive ducal carcinoma and intermediate to high-grade DCIS with calcifications (with the largest  contiguous focus of invasive carcinoma measuring 1.1 cm in greatest  linear dimension) ; all margins negative for carcinoma; margin status to invasive disease undetermined; margin status of in-situ disease of less than 1 mm from the anterior margin and focally less than 1 mm from the posterior margin; nodal status of 4/7 right axillary sentinel lymph nodes excisions positive for metastatic carcinoma (largest metastatic  deposit measuring 1.4 cm in the greatest linear dimension). All positive nodes were without evidence of nodal extension.  ER status >95% positive with moderate staining intensity; PR status 100% positive with strong staining intensity; Her2 status negative; Grade 2.  The patient also had a tissue expander placed during this procedure.  Given the 4 positive nodes, the patient proceeded with adjuvant chemotherapy consisting of dose dense Adriamycin, Cytoxan x4, followed by weekly Taxol on 08/25/21 through 01/19/22. Overall, the patient tolerated systemic treatment quite well other than mild fatigue, mild peripheral neuropathy (resolved), and back pain likely secondary to taxol. She also received IV iron for iron deficiency while undergoing Taxol treatments.  Other pertinent imaging thus far includes:  -- While body bone scan on 06/23/21 which revealed no evidence of osseous metastatic disease.  Of note: the patient developed an ulceration along her mastectomy scar and a small seroma. Subsequently, she underwent removal of the tissue expander on 12/04/21. Cultures obtained from seroma were negative and the patient has recovered well. Per Dr. Iran Planas, the patient will return to discuss breast reconstructive surgery about 3-6 months post RT.  Anticipates going to another center for other techniques.  Lymphedema issues, if any:  Yes--has a compression sleeve; had final PT session on 01/17/2022    Pain issues, if any:  Patient denies   SAFETY ISSUES: Prior radiation? No Pacemaker/ICD? No Possible current pregnancy? No--LMP: ~12/24/2021 Is the patient on methotrexate? No  Current Complaints / other details:  Has a small area of swelling to her left anterior  lower leg that she is concerned about (reports it was present before she started systemic therapy, but went away while she received chemotherapy, but has returned since she finished treatment)  She underwent in vitro fertilization for her two  children.  Her husband is present via speaker phone today.  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    PAST SURGICAL HISTORY: Past Surgical History:  Procedure Laterality Date   APPENDECTOMY  2006   BREAST RECONSTRUCTION WITH PLACEMENT OF TISSUE EXPANDER AND ALLODERM Right 07/25/2021   Procedure: RIGHT BREAST RECONSTRUCTION WITH PLACEMENT OF TISSUE EXPANDER AND ALLODERM;  Surgeon: Irene Limbo, MD;  Location: [facility];  Service: Plastics;  Laterality: Right;   CERCLAGE LAPAROSCOPIC ABDOMINAL N/A 04/15/2015   Procedure: LAPAROSCOPIC TRANSABDOMINAL CERVCOISTHMIC CERCLAGE;  Surgeon: Governor Specking, MD;  Location: [facility];  Service: Gynecology;  Laterality: N/A;   CERVICAL CERCLAGE  02/2011   vaginal   CESAREAN SECTION N/A 10/12/2015   Procedure: CESAREAN SECTION;  Surgeon: Princess Bruins, MD;  Location: [facility];  Service: Obstetrics;  Laterality: N/A;   CESAREAN SECTION N/A 03/03/2018   Procedure: Repeat CESAREAN SECTION/Removal of Abdominal Cerclage;  Surgeon: Azucena Fallen, MD;  Location: [facility];  Service: Obstetrics;  Laterality: N/A;  EDD: 03/28/18   MASTECTOMY W/ SENTINEL NODE BIOPSY Right 07/25/2021   Procedure: RIGHT MASTECTOMY WITH AXILLARY SENTINEL LYMPH NODE BIOPSY;  Surgeon: [name], MD;  Location: [facility];  Service: General;  Laterality: Right;   PORTACATH PLACEMENT N/A 08/24/2021   Procedure: INSERTION PORT-A-CATH;  Surgeon: [name], MD;  Location: [facility];  Service: General;  Laterality: N/A;   RADIOACTIVE  SEED GUIDED AXILLARY SENTINEL LYMPH NODE Right 07/25/2021   Procedure: RADIOACTIVE SEED GUIDED RIGHT AXILLARY NODE EXCISION;  Surgeon: [name], MD;  Location: [facility];  Service: General;  Laterality: Right;   TISSUE EXPANDER PLACEMENT Right 12/04/2021   Procedure: REMOVAL RIGHT CHEST TISSUE EXPANDER;  Surgeon: Irene Limbo, MD;  Location: [facility];  Service: Plastics;  Laterality: Right;   WISDOM TOOTH EXTRACTION  2001    FAMILY HISTORY: family history includes Alcoholism in her maternal grandfather; Alzheimer's disease in her maternal grandmother; Cirrhosis in her maternal grandfather; Diabetes in her father and maternal grandmother; HIV in her maternal uncle and maternal uncle; Hypertension in her maternal grandmother; Other in her paternal grandfather; Skin cancer (age of onset: 49) in her maternal uncle.  SOCIAL HISTORY:  reports that she has never smoked. She has never used smokeless tobacco. She reports current alcohol use. She reports that she does not use drugs.  ALLERGIES: Betadine [povidone iodine] and Tape  MEDICATIONS:  Current Outpatient Medications  Medication Sig Dispense Refill   Empagliflozin-metFORMIN HCl ER (SYNJARDY XR) 09-999 MG TB24 Take by mouth.     lidocaine-prilocaine (EMLA) cream Apply to affected area once 30 g 3   ONETOUCH VERIO test strip 1 each by Other route 2 (two) times daily.     TRADJENTA 5 MG TABS tablet Take 5 mg by mouth daily.     No current facility-administered medications for this encounter.    REVIEW OF SYSTEMS: As above in HPI.   PHYSICAL EXAM:  height is $RemoveB'5\' 7"'TaXPUYDt$  (1.702 m) and weight is 158 lb 8 oz (71.9 kg). Her temporal temperature is 96.7 F (35.9 C) (abnormal). Her blood pressure is 102/72 and her pulse is 95. Her respiration is 18 and oxygen saturation is 99%.   General: Alert and oriented, in no acute distress Heart: Regular in rate and rhythm with no murmurs, rubs, or gallops. Chest: Clear to auscultation  bilaterally, with no rhonchi, wheezes, or rales. Skin: No concerning lesions. Skin has healed over right chest. Musculoskeletal: symmetric strength and muscle tone throughout. Neurologic: Cranial nerves II through XII are grossly intact. No obvious focalities. Speech is fluent. Coordination is intact. Psychiatric: Judgment and insight are intact. Affect is appropriate. Breasts: s/p right mastectomy and tissue expander removal . No palpable masses appreciated in the breasts or axillae b/l.   ECOG = 0  0 - Asymptomatic (Fully active, able to carry on all predisease activities without restriction)  1 - Symptomatic but completely ambulatory (Restricted in physically strenuous activity but ambulatory and able to carry out work of a light or sedentary nature. For example, light housework, office work)  2 - Symptomatic, <50% in bed during the day (Ambulatory and capable of all self care but unable to carry out any work activities. Up and about more than 50% of waking hours)  3 - Symptomatic, >50% in bed, but not bedbound (Capable of only limited self-care, confined to bed or chair 50% or more of waking hours)  4 - Bedbound (Completely disabled. Cannot carry on any self-care. Totally confined to bed or chair)  5 - Death   Eustace Pen MM, Creech RH, Tormey DC, et al. [phone]). "Toxicity and response criteria of the [facility]". [place] Oncol. 5 (6): 649-55   LABORATORY DATA:  Lab Results  Component Value Date   WBC 3.2 (L) 01/19/2022   HGB 11.1 (L) 01/19/2022   HCT 34.4 (L) 01/19/2022   MCV 78.9 (L) 01/19/2022   PLT 275 01/19/2022   CMP     Component Value Date/Time   NA 137 01/19/2022 0826   K 3.8 01/19/2022 0826   CL 103 01/19/2022 0826   CO2 25 01/19/2022 0826   GLUCOSE 219 (H) 01/19/2022 0826   BUN 10 01/19/2022 0826   CREATININE 0.47 01/19/2022 0826   CALCIUM 9.3 01/19/2022 0826   PROT 6.6 01/19/2022 0826   ALBUMIN 4.3 01/19/2022 0826   AST 21 01/19/2022  0826   ALT 27 01/19/2022 0826   ALKPHOS 58 01/19/2022 0826   BILITOT 0.4 01/19/2022 0826   GFRNONAA >60 01/19/2022 0826   GFRAA >60 10/11/2017 1150       RADIOGRAPHY:  As above. I have personally reviewed her imaging     IMPRESSION/PLAN: This is a delightful 46 yo woman with history of right breast cancer.    It was a pleasure meeting the patient today while also speaking with her husband via speaker phone. We discussed the risks, benefits, and side effects of post mastectomy radiotherapy. I recommend radiotherapy to the right chest wall and regional nodes (IMN, SCV, axillary) to reduce her risk of locoregional recurrence by 2/3.  We discussed that radiation would take approximately 6 weeks to complete.  She is hoping to get her port a cath removed next week - if this is possible, we will schedule treatment planning soon after. Otherwise, if PAC cannot be removed soon, she would like to proceed with RT ASAP.  We spoke about acute effects including skin irritation, skin  peeling, skin pain, and fatigue as well as much less common late effects including internal organ injury or irritation. We discussed pneumonitis risk in detail and also risks of rib fracture and arm lymphedema. We spoke about the latest technology that is used to minimize the risk of late effects for patients undergoing radiotherapy to the breast or chest wall. We spoke about forgoing a scar boost treatment to minimize risks related to future tissue reconstruction. No guarantees of treatment were given. The patient is enthusiastic about proceeding with treatment. I look forward to participating in the patient's care.  Consent signed today.   We did discuss proton therapy as well and I explained how this would reduce lung dose. However, she is a nonsmoker and her risk of symptomatic pneumonitis is relatively low. She and her husband wish to receive treatment here and decline a referral to consider proton therapy. I think their  choice is very reasonable.  Lower leg mass: XRAY ordered today. Consider [place] in future.  On date of service, in total, I spent 65 minutes on this encounter. Patient was seen in person.   __________________________________________   Eppie Gibson, MD  This document serves as a record of services personally performed by Eppie Gibson, MD. It was created on her behalf by Roney Mans, a trained medical scribe. The creation of this record is based on the scribe's personal observations and the provider's statements to them. This document has been checked and approved by the attending provider.

## 2022-02-02 ENCOUNTER — Ambulatory Visit
Admission: RE | Admit: 2022-02-02 | Discharge: 2022-02-02 | Disposition: A | Discharge status: Home / Self Care | Payer: 59 | Source: Ambulatory Visit | Attending: Radiation Oncology | Admitting: Radiation Oncology

## 2022-02-02 ENCOUNTER — Ambulatory Visit (HOSPITAL_COMMUNITY)
Admission: RE | Admit: 2022-02-02 | Discharge: 2022-02-02 | Disposition: A | Discharge status: Home / Self Care | Payer: 59 | Source: Ambulatory Visit | Attending: Radiation Oncology | Admitting: Radiation Oncology

## 2022-02-02 ENCOUNTER — Ambulatory Visit: Payer: 59

## 2022-02-02 ENCOUNTER — Other Ambulatory Visit: Payer: 59

## 2022-02-02 ENCOUNTER — Other Ambulatory Visit: Payer: Self-pay

## 2022-02-02 ENCOUNTER — Encounter: Payer: Self-pay | Admitting: Radiation Oncology

## 2022-02-02 ENCOUNTER — Ambulatory Visit: Payer: 59 | Admitting: Hematology and Oncology

## 2022-02-02 VITALS — BP 102/72 | HR 95 | Temp 96.7°F | Resp 18 | Ht 67.0 in | Wt 158.5 lb

## 2022-02-02 DIAGNOSIS — C50811 Malignant neoplasm of overlapping sites of right female breast: Secondary | ICD-10-CM | POA: Diagnosis present

## 2022-02-02 DIAGNOSIS — R2242 Localized swelling, mass and lump, left lower limb: Secondary | ICD-10-CM

## 2022-02-02 DIAGNOSIS — Z9011 Acquired absence of right breast and nipple: Secondary | ICD-10-CM | POA: Diagnosis not present

## 2022-02-02 DIAGNOSIS — Z170 Estrogen receptor positive status [ER+]: Secondary | ICD-10-CM | POA: Diagnosis not present

## 2022-02-02 DIAGNOSIS — Z9221 Personal history of antineoplastic chemotherapy: Secondary | ICD-10-CM | POA: Diagnosis not present

## 2022-02-02 DIAGNOSIS — Z7984 Long term (current) use of oral hypoglycemic drugs: Secondary | ICD-10-CM | POA: Insufficient documentation

## 2022-02-02 DIAGNOSIS — E119 Type 2 diabetes mellitus without complications: Secondary | ICD-10-CM | POA: Diagnosis not present

## 2022-02-02 LAB — PREGNANCY, URINE: Preg Test, Ur: NEGATIVE

## 2022-02-05 ENCOUNTER — Ambulatory Visit: Payer: 59 | Admitting: Radiation Oncology

## 2022-02-05 ENCOUNTER — Telehealth: Payer: Self-pay

## 2022-02-05 ENCOUNTER — Other Ambulatory Visit: Payer: Self-pay | Admitting: Radiation Oncology

## 2022-02-05 ENCOUNTER — Telehealth: Payer: Self-pay | Admitting: *Deleted

## 2022-02-05 ENCOUNTER — Other Ambulatory Visit: Payer: Self-pay

## 2022-02-05 DIAGNOSIS — R2242 Localized swelling, mass and lump, left lower limb: Secondary | ICD-10-CM

## 2022-02-05 NOTE — Telephone Encounter (Signed)
Called and spoke with patient to relay negative left lower leg x-ray results. Inquired if patient would like [place] to order soft tissue ultrasound as discussed with Dr. Isidore Moos during her visit last week. Patient verbalized understanding/appreciation of call, and expressed interest in having US performed. Orders placed and informed her that our scheduler Enid Derry would be reaching out once [place] was able to be scheduled. No other needs identified at this time, but patient knows she can reach me directly

## 2022-02-05 NOTE — Telephone Encounter (Signed)
Called patient to inform of ultrasound for 02-06-22- arrival time- 8:30 am @ [facility], no restrictions to test, spoke with patient and she is aware of this test

## 2022-02-06 ENCOUNTER — Ambulatory Visit (HOSPITAL_BASED_OUTPATIENT_CLINIC_OR_DEPARTMENT_OTHER): Payer: 59

## 2022-02-06 ENCOUNTER — Ambulatory Visit (HOSPITAL_COMMUNITY): Payer: 59

## 2022-02-06 ENCOUNTER — Encounter: Payer: Self-pay | Admitting: Hematology and Oncology

## 2022-02-08 ENCOUNTER — Ambulatory Visit (HOSPITAL_COMMUNITY)
Admission: RE | Admit: 2022-02-08 | Discharge: 2022-02-08 | Disposition: A | Discharge status: Home / Self Care | Payer: 59 | Source: Ambulatory Visit | Attending: Radiation Oncology | Admitting: Radiation Oncology

## 2022-02-08 ENCOUNTER — Other Ambulatory Visit: Payer: Self-pay

## 2022-02-08 ENCOUNTER — Encounter: Payer: Self-pay | Admitting: *Deleted

## 2022-02-08 DIAGNOSIS — R2242 Localized swelling, mass and lump, left lower limb: Secondary | ICD-10-CM | POA: Insufficient documentation

## 2022-02-09 ENCOUNTER — Other Ambulatory Visit: Payer: 59

## 2022-02-09 ENCOUNTER — Ambulatory Visit: Payer: 59

## 2022-02-12 ENCOUNTER — Ambulatory Visit: Payer: 59 | Admitting: Radiation Oncology

## 2022-02-14 ENCOUNTER — Ambulatory Visit: Payer: 59 | Admitting: Radiation Oncology

## 2022-02-19 ENCOUNTER — Ambulatory Visit
Admission: RE | Admit: 2022-02-19 | Discharge: 2022-02-19 | Disposition: A | Discharge status: Home / Self Care | Payer: 59 | Source: Ambulatory Visit | Attending: Radiation Oncology | Admitting: Radiation Oncology

## 2022-02-19 ENCOUNTER — Encounter: Payer: Self-pay | Admitting: *Deleted

## 2022-02-19 ENCOUNTER — Other Ambulatory Visit: Payer: Self-pay

## 2022-02-19 ENCOUNTER — Ambulatory Visit: Payer: 59

## 2022-02-19 DIAGNOSIS — E119 Type 2 diabetes mellitus without complications: Secondary | ICD-10-CM | POA: Diagnosis not present

## 2022-02-19 DIAGNOSIS — Z510 Encounter for antineoplastic radiation therapy: Secondary | ICD-10-CM | POA: Diagnosis present

## 2022-02-19 DIAGNOSIS — C50811 Malignant neoplasm of overlapping sites of right female breast: Secondary | ICD-10-CM | POA: Insufficient documentation

## 2022-02-19 DIAGNOSIS — Z9221 Personal history of antineoplastic chemotherapy: Secondary | ICD-10-CM | POA: Diagnosis not present

## 2022-02-19 DIAGNOSIS — Z7984 Long term (current) use of oral hypoglycemic drugs: Secondary | ICD-10-CM | POA: Diagnosis not present

## 2022-02-19 DIAGNOSIS — Z9011 Acquired absence of right breast and nipple: Secondary | ICD-10-CM | POA: Diagnosis not present

## 2022-02-19 DIAGNOSIS — Z170 Estrogen receptor positive status [ER+]: Secondary | ICD-10-CM | POA: Insufficient documentation

## 2022-02-20 ENCOUNTER — Ambulatory Visit: Payer: 59

## 2022-02-21 ENCOUNTER — Ambulatory Visit: Payer: 59 | Admitting: Radiation Oncology

## 2022-02-21 ENCOUNTER — Ambulatory Visit: Payer: 59

## 2022-02-21 DIAGNOSIS — C50811 Malignant neoplasm of overlapping sites of right female breast: Secondary | ICD-10-CM | POA: Diagnosis not present

## 2022-02-22 ENCOUNTER — Ambulatory Visit: Payer: 59

## 2022-02-22 ENCOUNTER — Other Ambulatory Visit: Payer: Self-pay

## 2022-02-22 ENCOUNTER — Ambulatory Visit: Payer: 59 | Attending: Plastic Surgery

## 2022-02-22 DIAGNOSIS — R293 Abnormal posture: Secondary | ICD-10-CM | POA: Insufficient documentation

## 2022-02-22 DIAGNOSIS — C50811 Malignant neoplasm of overlapping sites of right female breast: Secondary | ICD-10-CM | POA: Insufficient documentation

## 2022-02-22 DIAGNOSIS — Z170 Estrogen receptor positive status [ER+]: Secondary | ICD-10-CM | POA: Diagnosis present

## 2022-02-22 DIAGNOSIS — M25611 Stiffness of right shoulder, not elsewhere classified: Secondary | ICD-10-CM | POA: Insufficient documentation

## 2022-02-22 NOTE — Therapy (Signed)
[facility] ?[facility] ?[place], [place], [address] ?Phone: [phone]   Fax:  [phone] ? ?Physical Therapy Treatment ? ?Patient Details  ?Name: Allison Reed ?MRN: [id number] ?Date of Birth: 1976-03-05 ?Referring Provider (PT): Dr. Donne Hazel ? ? ?Encounter Date: 02/22/2022 ? ? PT End of Session - 02/22/22 0804   ? ? Visit Number 20   ? Number of Visits 23   ? Date for PT Re-Evaluation 03/22/22   ? PT Start Time 0805   ? PT Stop Time 2248   ? PT Time Calculation (min) 41 min   ? Activity Tolerance Patient tolerated treatment well   ? Behavior During Therapy [facility] for tasks assessed/performed   ? ?  ?  ? ?  ? ? ?Past Medical History:  ?Diagnosis Date  ? Diabetes mellitus without complication ([place])   ? Type 2  ? Family history of brain tumor   ? Family history of skin cancer   ? Gestational diabetes   ? Headache   ? otc med prn  ? Heartburn in pregnancy   ? Incompetent cervix in pregnancy 04/2015  ? Missed abortion   ? no surgery required  ? Postpartum care following cesarean delivery (11/2) 10/12/2015  ? right breast ca 06/2021  ? UTI in pregnancy 03/2015  ? recent UTI, treatment completed  ? ? ?Past Surgical History:  ?Procedure Laterality Date  ? APPENDECTOMY  2006  ? BREAST RECONSTRUCTION WITH PLACEMENT OF TISSUE EXPANDER AND ALLODERM Right 07/25/2021  ? Procedure: RIGHT BREAST RECONSTRUCTION WITH PLACEMENT OF TISSUE EXPANDER AND ALLODERM;  Surgeon: Irene Limbo, MD;  Location: [facility];  Service: Plastics;  Laterality: Right;  ? CERCLAGE LAPAROSCOPIC ABDOMINAL N/A 04/15/2015  ? Procedure: LAPAROSCOPIC TRANSABDOMINAL CERVCOISTHMIC CERCLAGE;  Surgeon: Governor Specking, MD;  Location: [facility];  Service: Gynecology;  Laterality: N/A;  ? CERVICAL CERCLAGE  02/2011  ? vaginal  ? CESAREAN SECTION N/A 10/12/2015  ? Procedure: CESAREAN SECTION;  Surgeon: Princess Bruins, MD;  Location: [facility];  Service: Obstetrics;  Laterality: N/A;  ? CESAREAN  SECTION N/A 03/03/2018  ? Procedure: Repeat CESAREAN SECTION/Removal of Abdominal Cerclage;  Surgeon: Azucena Fallen, MD;  Location: [facility];  Service: Obstetrics;  Laterality: N/A;  EDD: 03/28/18  ? MASTECTOMY W/ SENTINEL NODE BIOPSY Right 07/25/2021  ? Procedure: RIGHT MASTECTOMY WITH AXILLARY SENTINEL LYMPH NODE BIOPSY;  Surgeon: [name], MD;  Location: [facility];  Service: General;  Laterality: Right;  ? PORTACATH PLACEMENT N/A 08/24/2021  ? Procedure: INSERTION PORT-A-CATH;  Surgeon: [name], MD;  Location: [facility];  Service: General;  Laterality: N/A;  ? RADIOACTIVE SEED GUIDED AXILLARY SENTINEL LYMPH NODE Right 07/25/2021  ? Procedure: RADIOACTIVE SEED GUIDED RIGHT AXILLARY NODE EXCISION;  Surgeon: [name], MD;  Location: [facility];  Service: General;  Laterality: Right;  ? TISSUE EXPANDER PLACEMENT Right 12/04/2021  ? Procedure: REMOVAL RIGHT CHEST TISSUE EXPANDER;  Surgeon: Irene Limbo, MD;  Location: [facility];  Service: Plastics;  Laterality: Right;  ? [place] EXTRACTION  2001  ? ? ?There were no vitals filed for this visit. ? ? Subjective Assessment - 02/22/22 0806   ? ? Subjective I have sosme pulling under my right arm that started about 2 days ago and it feels like a cord again. I have been wearing my sleeve religiously.  It limits my end ROM.  I start my radiation on Monday at 4 pm.   ?  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 She noted a palpable right breast mass. This is followed by a mammogram with a mass with calcifications as really throughout the upper outer quadrant and in the central breast. This measures 9.4 x 8.7 x 6.7 cm. On ultrasound she had 3 separate masses at 10, 11, and 12:00. The sizes are 1.7, 2.1, and 2.2 cm. There is also a single abnormal axillary lymph node that has undergone biopsy. 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 She is s/p right breast mastectomy with SLNB 0/7 LN on August 16 andl also had tissue expanders in place.  She is currently undergoing chemotherapy for 6 months every other week and radiation will follow; emergency sugery to remove expander 12/04/21   ? Patient Stated Goals Reassess after surgery   ? Currently in Pain? No/denies   ? Pain Score 0-No pain   ? Multiple Pain Sites No   ? ?  ?  ? ?  ? ? ? ? ? [facility] PT Assessment - 02/22/22 0001   ? ?  ? Assessment  ? Medical Diagnosis Right breast Cancer   ? Referring Provider (PT) Dr. Donne Hazel   ? Hand Dominance Right   ? Prior Therapy yes   ?  ? Precautions  ? Precaution Comments lymphedema risk   ?  ? Cognition  ? Overall Cognitive Status Within Functional Limits for tasks assessed   ?  ? Observation/Other Assessments  ? Observations incisions well healed.  no visible swelling.  Moderate cording evident axilla to mid/lower upper arm with tenderness   ? Skin Integrity Portacath removed   ?  ? AROM  ? Right Shoulder Extension 42 Degrees   ? Right Shoulder Flexion 152 Degrees   ? Right Shoulder ABduction 158 Degrees   ? Right Shoulder External Rotation 92 Degrees   ? ?  ?  ? ?  ? ? ? ? ? ? ? ? ? ? ? ? ? ? ? ? [facility] Adult PT Treatment/Exercise - 02/22/22 0001   ? ?  ? Shoulder Exercises: Stretch  ? Other Shoulder Stretches supine AAROM flexion and scaption x 5 with wand   ?  ? Manual Therapy  ? Myofascial Release MFR to right medial upper arm,  and axilla,  areas of cording with longitudinal and S techniques   ? Passive ROM In supine into Rt shoulder flexion, abduction and D2 flexion; pt with improved end motions except still can feel and see pulling from cording   ? ?  ?   ? ?  ? ? ? ? ? ? ? ? ? ? ? ? ? ? ? PT Long Term Goals - 02/22/22 0853   ? ?  ? PT LONG TERM GOAL #1  ? Title Pt will achieve full right shoulder ROM post surgery for full return to daily activities   ? Time 4   ? Period Weeks   ? Status On-going   ? Target Date 03/22/22   ?  ? PT LONG TERM GOAL #4  ? Title Pt will be able to perform arm movements to continue with her [place] dancing   ?  Time 4   ? Period Weeks   ? Status On-going   ? Target Date 03/22/22   ?  ? PT LONG TERM GOAL #5  ? Title Pt  will progress through ABC strength class or other scapular/shoulder strengthening   ? Period Weeks   ? Status Achieved   ? Target Date 02/22/22   ? ?  ?  ? ?  ? ? ? ? ? ? ? ? Plan - 02/22/22 0848   ? ? Clinical Impression Statement Pt had cancelled a number of appts last month because she was feeling so much better.  She was sick last week and missed doing her exercises for several days. About 2 days ago she noticed a new onset of cording in her Right axillary region.  There are several cords causing limitations in all of her AROM, and her ROM has regressed 10 degrees for flexion and 17 degrees for abduction.  Resumed myofascial techniques today and pt was feeling less tight after treatment.  We will resume therapy to address deficits and return pt to her PLOF.  She starts radiation next week. She was encouraged to continue stretching 2x's per day. We will carry over goals not yet achieved.   ? Personal Factors and Comorbidities Comorbidity 1   ? Comorbidities right breast cancer s/p mastectomy; presently having chemo and pending radiation; recent emergency surgery to remove expander   ? Stability/Clinical Decision Making Stable/Uncomplicated   ? Rehab Potential Excellent   ? PT Frequency 2x / week   ? PT Duration 4 weeks   ? PT Treatment/Interventions ADLs/Self Care Home Management;Therapeutic exercise;Manual techniques;Patient/family education;Manual lymph drainage;Scar mobilization;Passive range of motion;Moist Heat   ? PT  Next Visit Plan resume MFR techniques, PROM, and progress as tolerated. Continue SOZO every 3 months   ? PT Home Exercise Plan 4 post op exs , supine wand flexion and scaption; modified downward dog on wall

## 2022-02-23 ENCOUNTER — Ambulatory Visit: Payer: 59

## 2022-02-26 ENCOUNTER — Encounter (HOSPITAL_BASED_OUTPATIENT_CLINIC_OR_DEPARTMENT_OTHER): Payer: Self-pay

## 2022-02-26 ENCOUNTER — Ambulatory Visit (HOSPITAL_BASED_OUTPATIENT_CLINIC_OR_DEPARTMENT_OTHER): Admit: 2022-02-26 | Payer: 59 | Admitting: General Surgery

## 2022-02-26 ENCOUNTER — Ambulatory Visit
Admission: RE | Admit: 2022-02-26 | Discharge: 2022-02-26 | Disposition: A | Discharge status: Home / Self Care | Payer: 59 | Source: Ambulatory Visit | Attending: Radiation Oncology | Admitting: Radiation Oncology

## 2022-02-26 ENCOUNTER — Ambulatory Visit: Payer: 59

## 2022-02-26 ENCOUNTER — Other Ambulatory Visit: Payer: Self-pay

## 2022-02-26 DIAGNOSIS — C50811 Malignant neoplasm of overlapping sites of right female breast: Secondary | ICD-10-CM | POA: Diagnosis not present

## 2022-02-26 DIAGNOSIS — Z170 Estrogen receptor positive status [ER+]: Secondary | ICD-10-CM

## 2022-02-26 SURGERY — REMOVAL PORT-A-CATH
Anesthesia: General

## 2022-02-26 MED ORDER — RADIAPLEXRX EX GEL: Freq: Once | CUTANEOUS | Status: AC

## 2022-02-26 NOTE — Progress Notes (Signed)
Pt here for patient teaching.   ? ?Pt given Radiation and You booklet, skin care instructions, and Radiaplex gel.  Alra remains on backorder; patient advised to use OTC unscented and aluminum free deodorant  ? ?Reviewed areas of pertinence such as fatigue, hair loss, skin changes, breast tenderness, and breast swelling .  ? ?Pt able to give teach back of to pat skin, use unscented/gentle soap, and drink plenty of water,apply Radiaplex bid, avoid applying anything to skin within 4 hours of treatment, avoid wearing an under wire bra, and to use an electric razor if they must shave.  ? ?Pt demonstrated understanding and verbalizes understanding of information given and will contact nursing with any questions or concerns.   ? ?[email or web address]/[email or web address]/index ?  ? ? ? ? ? ? ?

## 2022-02-27 ENCOUNTER — Other Ambulatory Visit: Payer: Self-pay

## 2022-02-27 ENCOUNTER — Ambulatory Visit
Admission: RE | Admit: 2022-02-27 | Discharge: 2022-02-27 | Disposition: A | Discharge status: Home / Self Care | Payer: 59 | Source: Ambulatory Visit | Attending: Radiation Oncology | Admitting: Radiation Oncology

## 2022-02-27 ENCOUNTER — Ambulatory Visit: Payer: 59

## 2022-02-27 DIAGNOSIS — C50811 Malignant neoplasm of overlapping sites of right female breast: Secondary | ICD-10-CM | POA: Diagnosis not present

## 2022-02-28 ENCOUNTER — Ambulatory Visit
Admission: RE | Admit: 2022-02-28 | Discharge: 2022-02-28 | Disposition: A | Discharge status: Home / Self Care | Payer: 59 | Source: Ambulatory Visit | Attending: Radiation Oncology | Admitting: Radiation Oncology

## 2022-02-28 ENCOUNTER — Other Ambulatory Visit: Payer: Self-pay

## 2022-02-28 ENCOUNTER — Ambulatory Visit: Payer: 59

## 2022-02-28 DIAGNOSIS — C50811 Malignant neoplasm of overlapping sites of right female breast: Secondary | ICD-10-CM | POA: Diagnosis not present

## 2022-02-28 NOTE — Progress Notes (Signed)
? ?Patient Care Team: ?[name], MD as PCP - General (Endocrinology) ?Mauro Kaufmann, RN as Oncology Nurse Navigator ?Rockwell Germany, RN as Oncology Nurse Navigator ? ?DIAGNOSIS:  ?Encounter Diagnosis  ?Name Primary?  ? Malignant neoplasm of overlapping sites of right breast in female, estrogen receptor positive ([employer])   ? ? ?SUMMARY OF ONCOLOGIC HISTORY: ?Oncology History  ?Malignant neoplasm of overlapping sites of right breast in female, estrogen receptor positive ([place])  ?06/16/2021 Initial Diagnosis  ? Palpable right breast mass: Breast MRI revealed extensive involvement of the right breast with mass and non-mass enhancement superior right breast mass measures 4.9 x 1.2 cm and together with non-mass enhancement measured 8.8 cm, enhancing mass LOQ 1.3 cm, left breast indeterminate 0.9 cm mass and a 0.8 cm mass UOQ, single abnormal lymph node ?Biopsy 10:00, 11:00 and 12:00: IDC with DCIS, biopsy right axillary lymph node: IDC, ER 75 to 95%, PR 90 to 95%, Ki-67 25%, HER2 negative on 1 biopsy and 2+ by IHC and FISH pending ?  ?06/21/2021 Cancer Staging  ? Staging form: Breast, AJCC 8th Edition ?- Clinical stage from 06/21/2021: Stage IIA (cT3, cN1, cM0, G2, ER+, PR+, HER2-) - Signed by [name], MD on 06/21/2021 ?Histologic grading system: 3 grade system ? ?  ?07/01/2021 Genetic Testing  ? Negative genetic testing:  No pathogenic variants detected on the [employer] BRCAplus panel (report date 07/01/2021) or the [employer] CancerNext-Expanded + RNAinsight panel (report date 07/01/2021).  ? ?The BRCAplus panel offered by [employer] and includes sequencing and deletion/duplication analysis for the following 8 genes: ATM, BRCA1, BRCA2, CDH1, CHEK2, PALB2, PTEN, and TP53. The CancerNext-Expanded + RNAinsight gene panel offered by [employer] and includes sequencing and rearrangement analysis for the following 77 genes: AIP, ALK, APC, ATM, AXIN2, BAP1, BARD1, BLM, BMPR1A, BRCA1, BRCA2, BRIP1, CDC73, CDH1, CDK4, CDKN1B,  CDKN2A, CHEK2, CTNNA1, DICER1, FANCC, FH, FLCN, GALNT12, KIF1B, LZTR1, MAX, MEN1, MET, MLH1, MSH2, MSH3, MSH6, MUTYH, NBN, NF1, NF2, NTHL1, PALB2, PHOX2B, PMS2, POT1, PRKAR1A, PTCH1, PTEN, RAD51C, RAD51D, RB1, RECQL, RET, SDHA, SDHAF2, SDHB, SDHC, SDHD, SMAD4, SMARCA4, SMARCB1, SMARCE1, STK11, SUFU, TMEM127, TP53, TSC1, TSC2, VHL and XRCC2 (sequencing and deletion/duplication); EGFR, EGLN1, HOXB13, KIT, MITF, PDGFRA, POLD1 and POLE (sequencing only); EPCAM and GREM1 (deletion/duplication only). RNA data is routinely analyzed for use in variant interpretation for all genes. ?  ?07/25/2021 Surgery  ? Right mastectomy: Foci of invasive ductal carcinoma measuring 1.1 cm grade 2 with extensive DCIS spanning 8.9 cm, margins negative, 4/7 lymph nodes, ER 75 to 95%, PR 9095%, HER2 negative, Ki-67 25% ?  ?08/25/2021 - 01/19/2022 Adjuvant Chemotherapy  ? Adjuvant chemotherapy: dose dense AC x 4 followed by weekly Taxol x 12.  ?  ? ? ?CHIEF COMPLIANT: Follow-up on radiation ? ?INTERVAL HISTORY: Allison Reed is a 46 y.o. with above-mentioned history of right breast cancer treated with mastectomies followed by adjuvant chemotherapy.  She is currently undergoing radiation and appears to be tolerating it fairly well.  She has noticed some cording in the right axilla and came in to consider taking steroids.  However she does not want to receive any steroids and she wants to naturally get better. ? ?ALLERGIES:  is allergic to betadine [povidone iodine] and tape. ? ?MEDICATIONS:  ?Current Outpatient Medications  ?Medication Sig Dispense Refill  ? Empagliflozin-metFORMIN HCl ER (SYNJARDY XR) 09-999 MG TB24 Take by mouth.    ? lidocaine-prilocaine (EMLA) cream Apply to affected area once 30 g 3  ? ONETOUCH VERIO test strip  1 each by Other route 2 (two) times daily.    ? TRADJENTA 5 MG TABS tablet Take 5 mg by mouth daily.    ? ?No current facility-administered medications for this visit.  ? ? ?PHYSICAL EXAMINATION: ?ECOG PERFORMANCE  STATUS: 2 - Symptomatic, <50% confined to bed ? ?Vitals:  ? 03/01/22 1039  ?BP: 103/73  ?Pulse: 89  ?Resp: 18  ?Temp: (!) 97.5 ?F (36.4 ?C)  ?SpO2: 99%  ? ?Filed Weights  ? 03/01/22 1039  ?Weight: 158 lb 14.4 oz (72.1 kg)  ? ?   ? ?LABORATORY DATA:  ?I have reviewed the data as listed ? ?  Latest Ref Rng & Units 03/01/2022  ? 10:27 AM 01/19/2022  ?  8:26 AM 01/12/2022  ?  7:49 AM  ?CMP  ?Glucose 70 - 99 mg/dL 168   219   196    ?BUN 6 - 20 mg/dL _0 ?Creatinine 0.44 - 1.00 mg/dL 0.55   0.47   0.54    ?Sodium 135 - 145 mmol/L 138   137   139    ?Potassium 3.5 - 5.1 mmol/L 4.1   3.8   4.1    ?Chloride 98 - 111 mmol/L 101   103   105    ?CO2 22 - 32 mmol/L _1 ?Calcium 8.9 - 10.3 mg/dL 9.7   9.3   9.4    ?Total Protein 6.5 - 8.1 g/dL 6.9   6.6   6.7    ?Total Bilirubin 0.3 - 1.2 mg/dL 0.5   0.4   0.5    ?Alkaline Phos 38 - 126 U/L 63   58   59    ?AST 15 - 41 U/L _2 ?ALT 0 - 44 U/L _3 ? ? ?Lab Results  ?Component Value Date  ? WBC 5.4 03/01/2022  ? HGB 13.4 03/01/2022  ? HCT 40.5 03/01/2022  ? MCV 79.3 (L) 03/01/2022  ? PLT 250 03/01/2022  ? NEUTROABS 3.7 03/01/2022  ? ? ?ASSESSMENT & PLAN:  ?Malignant neoplasm of overlapping sites of right breast in female, estrogen receptor positive ([place]) ?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, margins negative, 4/7 lymph nodes, ER 75 to 95%, PR 9095%, HER2 negative, Ki-67 25% ?Genetics: Negative ?MammaPrint: Low risk (this is not a valid test for 4+ lymph nodes) ?Repeat prognostic panel on the lymph node in the final pathology also came back as ER/PR positive ?  ?Treatment plan: ?1.  Adjuvant chemotherapy with dose dense Adriamycin and Cytoxan x4 followed by Taxol weekly x12 started 08/25/2021-01/19/2022 ?2. adjuvant radiation therapy 02/27/2022-04/04/2022 ?3.  Followed by adjuvant antiestrogen therapy with complete estrogen blockade and abemaciclib  (discussed oophorectomy versus Zoladex plus letrozole plus abemaciclib) ?------------------------------------------------------------------------------------------------------------------------ ? ?Counseling: We discussed the residual adverse effects from chemotherapy and fortunately she has not had any peripheral neuropathy.  She continues to have some fatigue which is slowly getting better. ?Her hair is growing fairly well. ? ?Recommendation: Return to clinic at the end of radiation to start Zoladex with letrozole. ?She has plans to go to [facility] in July and might consider starting abemaciclib after she returns back from the trip. ?Talked about risks and benefits of Zoladex and the other option would be oophorectomy. ? ?Return to clinic  at the end of radiation ? ? ? ?No orders of the defined types were placed in this encounter. ? ?The patient has a good understanding of the overall plan. she agrees with it. she will call with any problems that may develop before the next visit here. ?Total time spent: 30 mins including face to face time and time spent for planning, charting and co-ordination of care ? ? Harriette Ohara, MD ?03/01/22 ? ? ? I Gardiner Coins am scribing for Dr. Lindi Adie ? ?I have reviewed the above documentation for accuracy and completeness, and I agree with the above. ?  ?

## 2022-03-01 ENCOUNTER — Inpatient Hospital Stay: Payer: 59 | Admitting: Hematology and Oncology

## 2022-03-01 ENCOUNTER — Ambulatory Visit: Payer: 59

## 2022-03-01 ENCOUNTER — Inpatient Hospital Stay: Payer: 59 | Attending: Hematology and Oncology

## 2022-03-01 ENCOUNTER — Ambulatory Visit
Admission: RE | Admit: 2022-03-01 | Discharge: 2022-03-01 | Disposition: A | Discharge status: Home / Self Care | Payer: 59 | Source: Ambulatory Visit | Attending: Radiation Oncology | Admitting: Radiation Oncology

## 2022-03-01 DIAGNOSIS — Z510 Encounter for antineoplastic radiation therapy: Secondary | ICD-10-CM | POA: Insufficient documentation

## 2022-03-01 DIAGNOSIS — C50811 Malignant neoplasm of overlapping sites of right female breast: Secondary | ICD-10-CM | POA: Insufficient documentation

## 2022-03-01 DIAGNOSIS — Z170 Estrogen receptor positive status [ER+]: Secondary | ICD-10-CM | POA: Diagnosis not present

## 2022-03-01 DIAGNOSIS — Z9221 Personal history of antineoplastic chemotherapy: Secondary | ICD-10-CM | POA: Insufficient documentation

## 2022-03-01 DIAGNOSIS — Z7984 Long term (current) use of oral hypoglycemic drugs: Secondary | ICD-10-CM | POA: Insufficient documentation

## 2022-03-01 DIAGNOSIS — E119 Type 2 diabetes mellitus without complications: Secondary | ICD-10-CM | POA: Insufficient documentation

## 2022-03-01 DIAGNOSIS — Z9011 Acquired absence of right breast and nipple: Secondary | ICD-10-CM | POA: Insufficient documentation

## 2022-03-01 LAB — CBC WITH DIFFERENTIAL (CANCER CENTER ONLY)
Abs Immature Granulocytes: 0.01 10*3/uL (ref 0.00–0.07)
Basophils Absolute: 0 10*3/uL (ref 0.0–0.1)
Basophils Relative: 0 %
Eosinophils Absolute: 0.1 10*3/uL (ref 0.0–0.5)
Eosinophils Relative: 3 %
HCT: 40.5 % (ref 36.0–46.0)
Hemoglobin: 13.4 g/dL (ref 12.0–15.0)
Immature Granulocytes: 0 %
Lymphocytes Relative: 24 %
Lymphs Abs: 1.3 10*3/uL (ref 0.7–4.0)
MCH: 26.2 pg (ref 26.0–34.0)
MCHC: 33.1 g/dL (ref 30.0–36.0)
MCV: 79.3 fL — ABNORMAL LOW (ref 80.0–100.0)
Monocytes Absolute: 0.3 10*3/uL (ref 0.1–1.0)
Monocytes Relative: 5 %
Neutro Abs: 3.7 10*3/uL (ref 1.7–7.7)
Neutrophils Relative %: 68 %
Platelet Count: 250 10*3/uL (ref 150–400)
RBC: 5.11 MIL/uL (ref 3.87–5.11)
RDW: 17.4 % — ABNORMAL HIGH (ref 11.5–15.5)
WBC Count: 5.4 10*3/uL (ref 4.0–10.5)
nRBC: 0 % (ref 0.0–0.2)

## 2022-03-01 LAB — CMP (CANCER CENTER ONLY)
ALT: 23 U/L (ref 0–44)
AST: 18 U/L (ref 15–41)
Albumin: 4.5 g/dL (ref 3.5–5.0)
Alkaline Phosphatase: 63 U/L (ref 38–126)
Anion gap: 10 (ref 5–15)
BUN: 10 mg/dL (ref 6–20)
CO2: 27 mmol/L (ref 22–32)
Calcium: 9.7 mg/dL (ref 8.9–10.3)
Chloride: 101 mmol/L (ref 98–111)
Creatinine: 0.55 mg/dL (ref 0.44–1.00)
GFR, Estimated: >60 mL/min (ref >60)
Glucose, Bld: 168 mg/dL — ABNORMAL HIGH (ref 70–99)
Potassium: 4.1 mmol/L (ref 3.5–5.1)
Sodium: 138 mmol/L (ref 135–145)
Total Bilirubin: 0.5 mg/dL (ref 0.3–1.2)
Total Protein: 6.9 g/dL (ref 6.5–8.1)

## 2022-03-01 NOTE — Assessment & Plan Note (Addendum)
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, margins negative, 4/7 lymph nodes, ER 75 to 95%, PR 9095%, HER2 negative, Ki-67 25% ?Genetics: Negative ?MammaPrint: Low risk (this is not a valid test for 4+ lymph nodes) ?Repeat prognostic panel on the lymph node in the final pathology also came back as ER/PR positive ?? ?Treatment plan: ?1.??Adjuvant chemotherapy with dose dense Adriamycin and Cytoxan x4 followed by Taxol weekly x12?started 08/25/2021-01/19/2022 ?2.?adjuvant radiation therapy 02/27/2022-04/04/2022 ?3.??Followed by adjuvant antiestrogen therapy with complete estrogen blockade and abemaciclib (discussed oophorectomy versus Zoladex plus letrozole plus abemaciclib) ?------------------------------------------------------------------------------------------------------------------------ ? ?Counseling: We discussed the residual adverse effects from chemotherapy and fortunately she has not had any peripheral neuropathy.  She continues to have some fatigue which is slowly getting better. ?Her hair is growing fairly well. ? ?Recommendation: Return to clinic at the end of radiation to start Zoladex with letrozole. ?She has plans to go to [facility] in July and might consider starting abemaciclib after she returns back from the trip. ?Talked about risks and benefits of Zoladex and the other option would be oophorectomy. ? ?Return to clinic at the end of radiation ?

## 2022-03-02 ENCOUNTER — Telehealth: Payer: Self-pay | Admitting: Hematology and Oncology

## 2022-03-02 ENCOUNTER — Other Ambulatory Visit: Payer: Self-pay

## 2022-03-02 ENCOUNTER — Ambulatory Visit: Payer: 59

## 2022-03-02 ENCOUNTER — Ambulatory Visit
Admission: RE | Admit: 2022-03-02 | Discharge: 2022-03-02 | Disposition: A | Discharge status: Home / Self Care | Payer: 59 | Source: Ambulatory Visit | Attending: Radiation Oncology | Admitting: Radiation Oncology

## 2022-03-02 DIAGNOSIS — C50811 Malignant neoplasm of overlapping sites of right female breast: Secondary | ICD-10-CM | POA: Diagnosis not present

## 2022-03-02 NOTE — Telephone Encounter (Signed)
Scheduled appointment per 3/23 los. Left message. Patient will be mailed an updated calendar. ?

## 2022-03-05 ENCOUNTER — Ambulatory Visit
Admission: RE | Admit: 2022-03-05 | Discharge: 2022-03-05 | Disposition: A | Discharge status: Home / Self Care | Payer: 59 | Source: Ambulatory Visit | Attending: Radiation Oncology | Admitting: Radiation Oncology

## 2022-03-05 ENCOUNTER — Ambulatory Visit: Payer: 59

## 2022-03-05 ENCOUNTER — Other Ambulatory Visit: Payer: Self-pay

## 2022-03-05 DIAGNOSIS — C50811 Malignant neoplasm of overlapping sites of right female breast: Secondary | ICD-10-CM | POA: Diagnosis not present

## 2022-03-06 ENCOUNTER — Ambulatory Visit: Payer: 59

## 2022-03-06 ENCOUNTER — Ambulatory Visit
Admission: RE | Admit: 2022-03-06 | Discharge: 2022-03-06 | Disposition: A | Discharge status: Home / Self Care | Payer: 59 | Source: Ambulatory Visit | Attending: Radiation Oncology | Admitting: Radiation Oncology

## 2022-03-06 VITALS — Wt 159.0 lb

## 2022-03-06 DIAGNOSIS — M25611 Stiffness of right shoulder, not elsewhere classified: Secondary | ICD-10-CM

## 2022-03-06 DIAGNOSIS — C50811 Malignant neoplasm of overlapping sites of right female breast: Secondary | ICD-10-CM | POA: Diagnosis not present

## 2022-03-06 DIAGNOSIS — R293 Abnormal posture: Secondary | ICD-10-CM

## 2022-03-06 DIAGNOSIS — Z170 Estrogen receptor positive status [ER+]: Secondary | ICD-10-CM

## 2022-03-06 NOTE — Therapy (Signed)
[facility] ?[facility] ?[place], [place], [address] ?Phone: [phone]   Fax:  [phone] ? ?Physical Therapy Treatment ? ?Patient Details  ?Name: Allison Reed ?MRN: [id number] ?Date of Birth: March 21, 1976 ?Referring Provider (PT): Dr. Donne Hazel ? ? ?Encounter Date: 03/06/2022 ? ? PT End of Session - 03/06/22 1000   ? ? Visit Number 21   ? Number of Visits 23   ? Date for PT Re-Evaluation 03/22/22   ? PT Start Time [phone]   ? PT Stop Time 0954   pt requested ending session early  ? PT Time Calculation (min) 48 min   ? Activity Tolerance Patient tolerated treatment well   ? Behavior During Therapy [facility] for tasks assessed/performed   ? ?  ?  ? ?  ? ? ?Past Medical History:  ?Diagnosis Date  ? Diabetes mellitus without complication ([place])   ? Type 2  ? Family history of brain tumor   ? Family history of skin cancer   ? Gestational diabetes   ? Headache   ? otc med prn  ? Heartburn in pregnancy   ? Incompetent cervix in pregnancy 04/2015  ? Missed abortion   ? no surgery required  ? Postpartum care following cesarean delivery (11/2) 10/12/2015  ? right breast ca 06/2021  ? UTI in pregnancy 03/2015  ? recent UTI, treatment completed  ? ? ?Past Surgical History:  ?Procedure Laterality Date  ? APPENDECTOMY  2006  ? BREAST RECONSTRUCTION WITH PLACEMENT OF TISSUE EXPANDER AND ALLODERM Right 07/25/2021  ? Procedure: RIGHT BREAST RECONSTRUCTION WITH PLACEMENT OF TISSUE EXPANDER AND ALLODERM;  Surgeon: Irene Limbo, MD;  Location: [facility];  Service: Plastics;  Laterality: Right;  ? CERCLAGE LAPAROSCOPIC ABDOMINAL N/A 04/15/2015  ? Procedure: LAPAROSCOPIC TRANSABDOMINAL CERVCOISTHMIC CERCLAGE;  Surgeon: Governor Specking, MD;  Location: [facility];  Service: Gynecology;  Laterality: N/A;  ? CERVICAL CERCLAGE  02/2011  ? vaginal  ? CESAREAN SECTION N/A 10/12/2015  ? Procedure: CESAREAN SECTION;  Surgeon: Princess Bruins, MD;  Location: [facility];  Service:  Obstetrics;  Laterality: N/A;  ? CESAREAN SECTION N/A 03/03/2018  ? Procedure: Repeat CESAREAN SECTION/Removal of Abdominal Cerclage;  Surgeon: Azucena Fallen, MD;  Location: [facility];  Service: Obstetrics;  Laterality: N/A;  EDD: 03/28/18  ? MASTECTOMY W/ SENTINEL NODE BIOPSY Right 07/25/2021  ? Procedure: RIGHT MASTECTOMY WITH AXILLARY SENTINEL LYMPH NODE BIOPSY;  Surgeon: [name], MD;  Location: [facility];  Service: General;  Laterality: Right;  ? PORTACATH PLACEMENT N/A 08/24/2021  ? Procedure: INSERTION PORT-A-CATH;  Surgeon: [name], MD;  Location: [facility];  Service: General;  Laterality: N/A;  ? RADIOACTIVE SEED GUIDED AXILLARY SENTINEL LYMPH NODE Right 07/25/2021  ? Procedure: RADIOACTIVE SEED GUIDED RIGHT AXILLARY NODE EXCISION;  Surgeon: [name], MD;  Location: [facility];  Service: General;  Laterality: Right;  ? TISSUE EXPANDER PLACEMENT Right 12/04/2021  ? Procedure: REMOVAL RIGHT CHEST TISSUE EXPANDER;  Surgeon: Irene Limbo, MD;  Location: [facility];  Service: Plastics;  Laterality: Right;  ? [place] EXTRACTION  2001  ? ? ?Vitals:  ? 03/06/22 0907  ?Weight: 159 lb (72.1 kg)  ? ? ? Subjective Assessment - 03/06/22 0909   ? ? Subjective I lost my compression sleeve. I have no idea where it ended up. Since I haven't been wearing it I've noticed increased achiness in my upper arm and I'm nervous about lymphedema. Can we do  the SOZO again?   ? 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 She noted a palpable right breast mass. This is followed by a mammogram with a mass with calcifications as really throughout the upper outer quadrant and in the central breast. This measures 9.4 x 8.7 x 6.7 cm. On ultrasound she had 3 separate masses at 10, 11, and 12:00. The sizes are 1.7, 2.1, and 2.2 cm. There is also a single abnormal axillary lymph node that has undergone biopsy. 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 She is s/p right breast mastectomy with SLNB 0/7 LN on August 16 andl also had tissue expanders in place.  She is currently undergoing chemotherapy for 6 months every other week and radiation will follow; emergency sugery to remove expander 12/04/21   ? Patient Stated Goals Reassess after surgery   ? Currently in Pain? No/denies   just achiness in upper arm  ? ?  ?  ? ?  ? ? ? ? ? ? ? ? ? L-DEX FLOWSHEETS - 03/06/22 0900   ? ?  ? L-DEX LYMPHEDEMA SCREENING  ? Measurement Type Unilateral   ? L-DEX MEASUREMENT EXTREMITY Upper Extremity   ? POSITION  Standing   ? DOMINANT SIDE Right   ? At Risk Side Right   ? BASELINE SCORE (UNILATERAL) 0   ? L-DEX SCORE (UNILATERAL) 1.6   ? VALUE CHANGE (UNILAT) 1.6   ? ?  ?  ? ?  ? ? ? ? ? ? ? ? ? ? ? ? [facility] Adult PT Treatment/Exercise - 03/06/22 0001   ? ?  ? Manual Therapy  ? Myofascial Release MFR to right medial upper arm,  and axilla,  areas of cording.   ? Manual Lymphatic Drainage (MLD) In Supine: Short neck, 5 diaphragmatic breaths, Rt inguinal and Lt axillary nodes, Rt axillo-inguinal and anterior inter-axillary anastomosis and then Rt UE and focuse don lateral trunk redirecting towards anastomosis.   ? Passive ROM In supine into Rt shoulder flexion, abduction and D2 flexion; pt with improved end motions except still can feel and see pulling from cording   ? ?  ?  ? ?  ? ? ? ? ? ? ? ? ? ? ? ? ? ? ? PT Long Term Goals - 02/22/22 0853   ? ?  ? PT LONG TERM GOAL #1  ? Title Pt will achieve full right shoulder ROM post surgery for full return to daily activities   ? Time 4   ? Period Weeks   ? Status On-going   ? Target Date  03/22/22   ?  ? PT LONG TERM GOAL #4  ? Title Pt will be able to perform arm movements to continue with her [place] dancing   ? Time 4   ? Period Weeks   ? Status On-going   ? Target Date 03/22/22   ?  ? PT LONG TERM GOAL #5  ? Title Pt  will progress through ABC strength class or other scapular/shoulder strengthening   ? Period Weeks   ? Status Achieved   ? Target Date 02/22/22   ? ?  ?  ? ?  ? ? ? ? ? ? ? ?  Plan - 03/06/22 1001   ? ? Clinical Impression Statement Pt comes in reporting her Rt shoulder A/ROM is doing good but can feel the cording pulling at end motions. Focused on manual therapy with MFR and end P/ROM of Rt axilla and shoulder. Also inlcuded MLD to Rt upper arm as pt reports she has been feeling achiness here from the cording. Redid SOZO per her request and her change of baseline was well WNLs at 1.6. She reports feeling much looser by end of session but needed to stop a little early due to increased skin sensitivity from where [facility] had been focused by end of session. Encouraged pt to call [facility] to see if they will reorder her same compression sleeve. If they will not, we will help her to order online.   ? Personal Factors and Comorbidities Comorbidity 1   ? Comorbidities right breast cancer s/p mastectomy; presently having chemo and pending radiation; recent emergency surgery to remove expander   ? Stability/Clinical Decision Making Stable/Uncomplicated   ? Rehab Potential Excellent   ? PT Frequency 2x / week   ? PT Duration 4 weeks   ? PT Treatment/Interventions ADLs/Self Care Home Management;Therapeutic exercise;Manual techniques;Patient/family education;Manual lymph drainage;Scar mobilization;Passive range of motion;Moist Heat   ? PT Next Visit Plan Cont MFR techniques, PROM, and progress as tolerated. Continue SOZO every 3 months. Did she get a new sleeve? Need help with this?   ? Consulted and Agree with Plan of Care Patient   ? ?  ?  ? ?  ? ? ?Patient will benefit from skilled  therapeutic intervention in order to improve the following deficits and impairments:  Postural dysfunction, Decreased knowledge of precautions, Decreased scar mobility, Increased fascial restricitons, Decreased range of motio

## 2022-03-07 ENCOUNTER — Other Ambulatory Visit: Payer: Self-pay | Admitting: Hematology and Oncology

## 2022-03-07 ENCOUNTER — Ambulatory Visit
Admission: RE | Admit: 2022-03-07 | Discharge: 2022-03-07 | Disposition: A | Discharge status: Home / Self Care | Payer: 59 | Source: Ambulatory Visit | Attending: Radiation Oncology | Admitting: Radiation Oncology

## 2022-03-07 ENCOUNTER — Other Ambulatory Visit: Payer: Self-pay

## 2022-03-07 ENCOUNTER — Ambulatory Visit: Payer: 59

## 2022-03-07 DIAGNOSIS — C50811 Malignant neoplasm of overlapping sites of right female breast: Secondary | ICD-10-CM | POA: Diagnosis not present

## 2022-03-07 NOTE — Progress Notes (Signed)
We will plan to start her on Zoladex injections since her menstrual cycles have come back. ? ?

## 2022-03-08 ENCOUNTER — Telehealth: Payer: Self-pay | Admitting: Hematology and Oncology

## 2022-03-08 ENCOUNTER — Ambulatory Visit: Payer: 59

## 2022-03-08 ENCOUNTER — Ambulatory Visit
Admission: RE | Admit: 2022-03-08 | Discharge: 2022-03-08 | Disposition: A | Discharge status: Home / Self Care | Payer: 59 | Source: Ambulatory Visit | Attending: Radiation Oncology | Admitting: Radiation Oncology

## 2022-03-08 DIAGNOSIS — C50811 Malignant neoplasm of overlapping sites of right female breast: Secondary | ICD-10-CM | POA: Diagnosis not present

## 2022-03-08 NOTE — Telephone Encounter (Signed)
.  Called patient to schedule appointment per 3/30 inbasket, patient is aware of date and time.   ?

## 2022-03-09 ENCOUNTER — Other Ambulatory Visit: Payer: Self-pay

## 2022-03-09 ENCOUNTER — Ambulatory Visit: Payer: 59

## 2022-03-09 ENCOUNTER — Ambulatory Visit
Admission: RE | Admit: 2022-03-09 | Discharge: 2022-03-09 | Disposition: A | Discharge status: Home / Self Care | Payer: 59 | Source: Ambulatory Visit | Attending: Radiation Oncology | Admitting: Radiation Oncology

## 2022-03-09 DIAGNOSIS — C50811 Malignant neoplasm of overlapping sites of right female breast: Secondary | ICD-10-CM | POA: Diagnosis not present

## 2022-03-12 ENCOUNTER — Ambulatory Visit: Payer: 59

## 2022-03-12 ENCOUNTER — Ambulatory Visit
Admission: RE | Admit: 2022-03-12 | Discharge: 2022-03-12 | Disposition: A | Discharge status: Home / Self Care | Payer: 59 | Source: Ambulatory Visit | Attending: Radiation Oncology | Admitting: Radiation Oncology

## 2022-03-12 ENCOUNTER — Other Ambulatory Visit: Payer: Self-pay

## 2022-03-12 DIAGNOSIS — Z5111 Encounter for antineoplastic chemotherapy: Secondary | ICD-10-CM | POA: Diagnosis present

## 2022-03-12 DIAGNOSIS — Z9221 Personal history of antineoplastic chemotherapy: Secondary | ICD-10-CM | POA: Diagnosis not present

## 2022-03-12 DIAGNOSIS — Z9011 Acquired absence of right breast and nipple: Secondary | ICD-10-CM | POA: Diagnosis not present

## 2022-03-12 DIAGNOSIS — C50811 Malignant neoplasm of overlapping sites of right female breast: Secondary | ICD-10-CM | POA: Diagnosis present

## 2022-03-12 DIAGNOSIS — Z510 Encounter for antineoplastic radiation therapy: Secondary | ICD-10-CM | POA: Insufficient documentation

## 2022-03-12 DIAGNOSIS — Z170 Estrogen receptor positive status [ER+]: Secondary | ICD-10-CM | POA: Diagnosis not present

## 2022-03-12 DIAGNOSIS — Z923 Personal history of irradiation: Secondary | ICD-10-CM | POA: Diagnosis not present

## 2022-03-13 ENCOUNTER — Ambulatory Visit: Payer: 59

## 2022-03-13 ENCOUNTER — Ambulatory Visit
Admission: RE | Admit: 2022-03-13 | Discharge: 2022-03-13 | Disposition: A | Discharge status: Home / Self Care | Payer: 59 | Source: Ambulatory Visit | Attending: Radiation Oncology | Admitting: Radiation Oncology

## 2022-03-13 ENCOUNTER — Ambulatory Visit: Payer: 59 | Attending: Plastic Surgery

## 2022-03-13 DIAGNOSIS — M25611 Stiffness of right shoulder, not elsewhere classified: Secondary | ICD-10-CM

## 2022-03-13 DIAGNOSIS — Z170 Estrogen receptor positive status [ER+]: Secondary | ICD-10-CM | POA: Diagnosis present

## 2022-03-13 DIAGNOSIS — R293 Abnormal posture: Secondary | ICD-10-CM | POA: Diagnosis present

## 2022-03-13 DIAGNOSIS — C50811 Malignant neoplasm of overlapping sites of right female breast: Secondary | ICD-10-CM | POA: Insufficient documentation

## 2022-03-13 NOTE — Therapy (Signed)
[facility] ?[facility] ?[place], [place], [address] ?Phone: [phone]   Fax:  [phone] ? ?Physical Therapy Treatment ? ?Patient Details  ?Name: Allison Reed ?MRN: [id number] ?Date of Birth: 27-Jul-1976 ?Referring Provider (PT): Dr. Donne Hazel ? ? ?Encounter Date: 03/13/2022 ? ? PT End of Session - 03/13/22 1002   ? ? Visit Number 22   ? Number of Visits 23   ? Date for PT Re-Evaluation 03/22/22   ? PT Start Time 0908   ? PT Stop Time [phone]   ended session early due to feeling incr skin sensitivity where being radiated  ? PT Time Calculation (min) 44 min   ? Activity Tolerance Patient tolerated treatment well   ? Behavior During Therapy [facility] for tasks assessed/performed   ? ?  ?  ? ?  ? ? ?Past Medical History:  ?Diagnosis Date  ? Diabetes mellitus without complication ([place])   ? Type 2  ? Family history of brain tumor   ? Family history of skin cancer   ? Gestational diabetes   ? Headache   ? otc med prn  ? Heartburn in pregnancy   ? Incompetent cervix in pregnancy 04/2015  ? Missed abortion   ? no surgery required  ? Postpartum care following cesarean delivery (11/2) 10/12/2015  ? right breast ca 06/2021  ? UTI in pregnancy 03/2015  ? recent UTI, treatment completed  ? ? ?Past Surgical History:  ?Procedure Laterality Date  ? APPENDECTOMY  2006  ? BREAST RECONSTRUCTION WITH PLACEMENT OF TISSUE EXPANDER AND ALLODERM Right 07/25/2021  ? Procedure: RIGHT BREAST RECONSTRUCTION WITH PLACEMENT OF TISSUE EXPANDER AND ALLODERM;  Surgeon: Irene Limbo, MD;  Location: [facility];  Service: Plastics;  Laterality: Right;  ? CERCLAGE LAPAROSCOPIC ABDOMINAL N/A 04/15/2015  ? Procedure: LAPAROSCOPIC TRANSABDOMINAL CERVCOISTHMIC CERCLAGE;  Surgeon: Governor Specking, MD;  Location: [facility];  Service: Gynecology;  Laterality: N/A;  ? CERVICAL CERCLAGE  02/2011  ? vaginal  ? CESAREAN SECTION N/A 10/12/2015  ? Procedure: CESAREAN SECTION;  Surgeon: Princess Bruins, MD;  Location: [facility];  Service: Obstetrics;  Laterality: N/A;  ? CESAREAN SECTION N/A 03/03/2018  ? Procedure: Repeat CESAREAN SECTION/Removal of Abdominal Cerclage;  Surgeon: Azucena Fallen, MD;  Location: [facility];  Service: Obstetrics;  Laterality: N/A;  EDD: 03/28/18  ? MASTECTOMY W/ SENTINEL NODE BIOPSY Right 07/25/2021  ? Procedure: RIGHT MASTECTOMY WITH AXILLARY SENTINEL LYMPH NODE BIOPSY;  Surgeon: [name], MD;  Location: [facility];  Service: General;  Laterality: Right;  ? PORTACATH PLACEMENT N/A 08/24/2021  ? Procedure: INSERTION PORT-A-CATH;  Surgeon: [name], MD;  Location: [facility];  Service: General;  Laterality: N/A;  ? RADIOACTIVE SEED GUIDED AXILLARY SENTINEL LYMPH NODE Right 07/25/2021  ? Procedure: RADIOACTIVE SEED GUIDED RIGHT AXILLARY NODE EXCISION;  Surgeon: [name], MD;  Location: [facility];  Service: General;  Laterality: Right;  ? TISSUE EXPANDER PLACEMENT Right 12/04/2021  ? Procedure: REMOVAL RIGHT CHEST TISSUE EXPANDER;  Surgeon: Irene Limbo, MD;  Location: [facility];  Service: Plastics;  Laterality: Right;  ? [place] EXTRACTION  2001  ? ? ?There were no vitals filed for this visit. ? ? Subjective Assessment - 03/13/22 0954   ? ? Subjective I orderded a new compression sleeve so that should arrive soon. I also called my insurance to see why they denied my claim and they said it was an ICD issue so I  got that fixed and resubmitted. I can feel a small new cord in my forearm when I stretch today and my upper traps are feeling really tight. I called to ask if I can get one of the free massages the cancer center offers and I'm waiting to hear back from my oncologist about that.   ? 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 She noted a palpable right breast mass. This is followed by a mammogram with a mass with calcifications as really throughout the upper outer  quadrant and in the central breast. This measures 9.4 x 8.7 x 6.7 cm. On ultrasound she had 3 separate masses at 10, 11, and 12:00. The sizes are 1.7, 2.1, and 2.2 cm. There is also a single abnormal axillary lymph node that has undergone biopsy. 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 She is s/p right breast mastectomy with SLNB 0/7 LN on August 16 andl also had tissue expanders in place.  She is currently undergoing chemotherapy for 6 months every other week and radiation will follow; emergency sugery to remove expander 12/04/21   ? Patient Stated Goals Reassess after surgery   ? Currently in Pain? No/denies   ? ?  ?  ? ?  ? ? ? ? ? ? ? ? ? ? ? ? ? ? ? ? ? ? ? ? [facility] Adult PT Treatment/Exercise - 03/13/22 0001   ? ?  ? Manual Therapy  ? Soft tissue mobilization In Supine to bil upper traps and suboccipital release where pt palpably tight today   ? Myofascial Release MFR to right medial upper arm, axilla and forearm where pt reports feelig tightness   ? Passive ROM In supine into Rt shoulder flexion, abduction and D2 flexion; pt with full P/ROM   ? ?  ?  ? ?  ? ? ? ? ? ? ? ? ? ? ? ? ? ? ? PT Long Term Goals - 02/22/22 0853   ? ?  ? PT LONG TERM GOAL #1  ? Title Pt will achieve full right shoulder ROM post surgery for full return to daily activities   ? Time 4   ? Period Weeks   ? Status On-going   ? Target Date 03/22/22   ?  ? PT LONG TERM GOAL #4  ? Title Pt will be able to perform arm movements to continue with her [place] dancing   ? Time 4   ? Period Weeks   ? Status On-going   ? Target Date 03/22/22   ?  ? PT LONG TERM GOAL #5  ? Title Pt  will progress through ABC strength class or other  scapular/shoulder strengthening   ? Period Weeks   ? Status Achieved   ? Target Date 02/22/22   ? ?  ?  ? ?  ? ? ? ? ? ? ? ? Plan - 03/13/22 1000   ? ? Clinical Impression Statement Focused on area of new cording at forearm and also briefly to bil upper traps with suboccipital release as pt reports feeling tight here, especially with end Rt shoulder ROM stretching she can feel the  pulling. Overall she reports feeling much looser after session and no longer feels pull from cording. Will monitor pts skin fragility as she conts thru radiation and place on hold prn. Explained this to pt and she verbalized good understanding of this. Ended session early due to increased redness noted at area of radiation and pt repors tightness feeling much improved as well.   ? Personal Factors and Comorbidities Comorbidity 1   ? Comorbidities right breast cancer s/p mastectomy; presently having chemo and pending radiation; recent emergency surgery to remove expander   ? Stability/Clinical Decision Making Stable/Uncomplicated   ? Rehab Potential Excellent   ? PT Frequency 2x / week   ? PT Duration 4 weeks   ? PT Treatment/Interventions ADLs/Self Care Home Management;Therapeutic exercise;Manual techniques;Patient/family education;Manual lymph drainage;Scar mobilization;Passive range of motion;Moist Heat   ? PT Next Visit Plan Cont MFR techniques, PROM, and progress as tolerated. Continue SOZO every 3 months. Did she get a new sleeve? Need help with this?   ? Consulted and Agree with Plan of Care Patient   ? ?  ?  ? ?  ? ? ?Patient will benefit from skilled therapeutic intervention in order to improve the following deficits and impairments:  Postural dysfunction, Decreased knowledge of precautions, Decreased scar mobility, Increased fascial restricitons, Decreased range of motion ? ?Visit Diagnosis: ?Malignant neoplasm of overlapping sites of right breast in female, estrogen receptor positive ([place]) ? ?Stiffness of right shoulder, not  elsewhere classified ? ?Abnormal posture ? ? ? ? ?Problem List ?Patient Active Problem List  ? Diagnosis Date Noted  ? Port-A-Cath in place 08/25/2021  ? S/P mastectomy, right 07/25/2021  ? Genetic testing 07/2

## 2022-03-14 ENCOUNTER — Ambulatory Visit: Payer: 59

## 2022-03-14 ENCOUNTER — Inpatient Hospital Stay: Payer: 59 | Attending: Hematology and Oncology

## 2022-03-14 ENCOUNTER — Ambulatory Visit
Admission: RE | Admit: 2022-03-14 | Discharge: 2022-03-14 | Disposition: A | Discharge status: Home / Self Care | Payer: 59 | Source: Ambulatory Visit | Attending: Radiation Oncology | Admitting: Radiation Oncology

## 2022-03-14 VITALS — BP 108/76 | HR 87 | Temp 98.7°F | Resp 16

## 2022-03-14 DIAGNOSIS — Z5111 Encounter for antineoplastic chemotherapy: Secondary | ICD-10-CM | POA: Insufficient documentation

## 2022-03-14 DIAGNOSIS — C50811 Malignant neoplasm of overlapping sites of right female breast: Secondary | ICD-10-CM | POA: Insufficient documentation

## 2022-03-14 DIAGNOSIS — Z9011 Acquired absence of right breast and nipple: Secondary | ICD-10-CM | POA: Insufficient documentation

## 2022-03-14 DIAGNOSIS — Z923 Personal history of irradiation: Secondary | ICD-10-CM | POA: Insufficient documentation

## 2022-03-14 DIAGNOSIS — Z9221 Personal history of antineoplastic chemotherapy: Secondary | ICD-10-CM | POA: Insufficient documentation

## 2022-03-14 DIAGNOSIS — Z510 Encounter for antineoplastic radiation therapy: Secondary | ICD-10-CM | POA: Insufficient documentation

## 2022-03-14 DIAGNOSIS — Z170 Estrogen receptor positive status [ER+]: Secondary | ICD-10-CM | POA: Insufficient documentation

## 2022-03-14 MED ORDER — GOSERELIN ACETATE 3.6 MG ~~LOC~~ IMPL
3.6000 mg | DRUG_IMPLANT | Freq: Once | SUBCUTANEOUS | Status: AC
  Administered 2022-03-14: 3.6 mg via SUBCUTANEOUS
  Filled 2022-03-14: qty 3.6

## 2022-03-14 NOTE — Patient Instructions (Signed)
Goserelin injection °What is this medication? °GOSERELIN (GOE se rel in) is similar to a hormone found in the body. It lowers the amount of sex hormones that the body makes. Men will have lower testosterone levels and women will have lower estrogen levels while taking this medicine. In men, this medicine is used to treat prostate cancer; the injection is either given once per month or once every 12 weeks. A once per month injection (only) is used to treat women with endometriosis, dysfunctional uterine bleeding, or advanced breast cancer. °This medicine may be used for other purposes; ask your health care provider or pharmacist if you have questions. °COMMON BRAND NAME(S): Zoladex, Zoladex 3-Month °What should I tell my care team before I take this medication? °They need to know if you have any of these conditions: °bone problems °diabetes °heart disease °history of irregular heartbeat °an unusual or allergic reaction to goserelin, other medicines, foods, dyes, or preservatives °pregnant or trying to get pregnant °breast-feeding °How should I use this medication? °This medicine is for injection under the skin. It is given by a health care professional in a hospital or clinic setting. °Talk to your pediatrician regarding the use of this medicine in children. Special care may be needed. °Overdosage: If you think you have taken too much of this medicine contact a poison control center or emergency room at once. °NOTE: This medicine is only for you. Do not share this medicine with others. °What if I miss a dose? °It is important not to miss your dose. Call your doctor or health care professional if you are unable to keep an appointment. °What may interact with this medication? °Do not take this medicine with any of the following medications: °cisapride °dronedarone °pimozide °thioridazine °This medicine may also interact with the following medications: °other medicines that prolong the QT interval (an abnormal heart  rhythm) °This list may not describe all possible interactions. Give your health care provider a list of all the medicines, herbs, non-prescription drugs, or dietary supplements you use. Also tell them if you smoke, drink alcohol, or use illegal drugs. Some items may interact with your medicine. °What should I watch for while using this medication? °Visit your doctor or health care provider for regular checks on your progress. Your symptoms may appear to get worse during the first weeks of this therapy. Tell your doctor or healthcare provider if your symptoms do not start to get better or if they get worse after this time. °Your bones may get weaker if you take this medicine for a long time. If you smoke or frequently drink alcohol you may increase your risk of bone loss. A family history of osteoporosis, chronic use of drugs for seizures (convulsions), or corticosteroids can also increase your risk of bone loss. Talk to your doctor about how to keep your bones strong. °This medicine should stop regular monthly menstruation in women. Tell your doctor if you continue to menstruate. °Women should not become pregnant while taking this medicine or for 12 weeks after stopping this medicine. Women should inform their doctor if they wish to become pregnant or think they might be pregnant. There is a potential for serious side effects to an unborn child. Talk to your health care professional or pharmacist for more information. Do not breast-feed an infant while taking this medicine. °Men should inform their doctors if they wish to father a child. This medicine may lower sperm counts. Talk to your health care professional or pharmacist for more information. °This   medicine may increase blood sugar. Ask your healthcare provider if changes in diet or medicines are needed if you have diabetes. °What side effects may I notice from receiving this medication? °Side effects that you should report to your doctor or health care  professional as soon as possible: °allergic reactions like skin rash, itching or hives, swelling of the face, lips, or tongue °bone pain °breathing problems °changes in vision °chest pain °feeling faint or lightheaded, falls °fever, chills °pain, swelling, warmth in the leg °pain, tingling, numbness in the hands or feet °signs and symptoms of high blood sugar such as being more thirsty or hungry or having to urinate more than normal. You may also feel very tired or have blurry vision °signs and symptoms of low blood pressure like dizziness; feeling faint or lightheaded, falls; unusually weak or tired °stomach pain °swelling of the ankles, feet, hands °trouble passing urine or change in the amount of urine °unusually high or low blood pressure °unusually weak or tired °Side effects that usually do not require medical attention (report to your doctor or health care professional if they continue or are bothersome): °change in sex drive or performance °changes in breast size in both males and females °changes in emotions or moods °headache °hot flashes °irritation at site where injected °loss of appetite °skin problems like acne, dry skin °vaginal dryness °This list may not describe all possible side effects. Call your doctor for medical advice about side effects. You may report side effects to FDA at 1-800-FDA-1088. °Where should I keep my medication? °This drug is given in a hospital or clinic and will not be stored at home. °NOTE: This sheet is a summary. It may not cover all possible information. If you have questions about this medicine, talk to your doctor, pharmacist, or health care provider. °© 2022 [employer] (2019-03-27 00:00:00) ° °

## 2022-03-14 NOTE — Progress Notes (Signed)
Ok to start Zoladex today per MD. ? ?Allison Reed, [place], BCPS, BCOP ?03/14/2022 ?3:49 PM ? ?

## 2022-03-15 ENCOUNTER — Ambulatory Visit: Payer: 59

## 2022-03-15 ENCOUNTER — Ambulatory Visit
Admission: RE | Admit: 2022-03-15 | Discharge: 2022-03-15 | Disposition: A | Discharge status: Home / Self Care | Payer: 59 | Source: Ambulatory Visit | Attending: Radiation Oncology | Admitting: Radiation Oncology

## 2022-03-15 ENCOUNTER — Other Ambulatory Visit: Payer: Self-pay

## 2022-03-15 DIAGNOSIS — C50811 Malignant neoplasm of overlapping sites of right female breast: Secondary | ICD-10-CM | POA: Diagnosis not present

## 2022-03-16 ENCOUNTER — Ambulatory Visit
Admission: RE | Admit: 2022-03-16 | Discharge: 2022-03-16 | Disposition: A | Discharge status: Home / Self Care | Payer: 59 | Source: Ambulatory Visit | Attending: Radiation Oncology | Admitting: Radiation Oncology

## 2022-03-16 ENCOUNTER — Ambulatory Visit: Payer: 59

## 2022-03-16 DIAGNOSIS — C50811 Malignant neoplasm of overlapping sites of right female breast: Secondary | ICD-10-CM | POA: Diagnosis not present

## 2022-03-19 ENCOUNTER — Ambulatory Visit: Payer: 59

## 2022-03-19 ENCOUNTER — Other Ambulatory Visit: Payer: Self-pay

## 2022-03-19 ENCOUNTER — Ambulatory Visit
Admission: RE | Admit: 2022-03-19 | Discharge: 2022-03-19 | Disposition: A | Discharge status: Home / Self Care | Payer: 59 | Source: Ambulatory Visit | Attending: Radiation Oncology | Admitting: Radiation Oncology

## 2022-03-19 DIAGNOSIS — C50811 Malignant neoplasm of overlapping sites of right female breast: Secondary | ICD-10-CM | POA: Diagnosis not present

## 2022-03-19 DIAGNOSIS — Z170 Estrogen receptor positive status [ER+]: Secondary | ICD-10-CM

## 2022-03-19 MED ORDER — RADIAPLEXRX EX GEL: Freq: Once | CUTANEOUS | Status: AC

## 2022-03-20 ENCOUNTER — Ambulatory Visit: Payer: 59

## 2022-03-20 ENCOUNTER — Ambulatory Visit
Admission: RE | Admit: 2022-03-20 | Discharge: 2022-03-20 | Disposition: A | Discharge status: Home / Self Care | Payer: 59 | Source: Ambulatory Visit | Attending: Radiation Oncology | Admitting: Radiation Oncology

## 2022-03-20 DIAGNOSIS — C50811 Malignant neoplasm of overlapping sites of right female breast: Secondary | ICD-10-CM | POA: Diagnosis not present

## 2022-03-20 NOTE — Progress Notes (Signed)
?Patient Care Team: ?South, Stephen, MD as PCP - General (Endocrinology) ?Stuart, Dawn C, RN as Oncology Nurse Navigator ?Martini, Keisha N, RN as Oncology Nurse Navigator ? ?DIAGNOSIS:  ?Encounter Diagnosis  ?Name Primary?  ? Malignant neoplasm of overlapping sites of right breast in female, estrogen receptor positive (HCC)   ? ? ?SUMMARY OF ONCOLOGIC HISTORY: ?Oncology History  ?Malignant neoplasm of overlapping sites of right breast in female, estrogen receptor positive (HCC)  ?06/16/2021 Initial Diagnosis  ? Palpable right breast mass: Breast MRI revealed extensive involvement of the right breast with mass and non-mass enhancement superior right breast mass measures 4.9 x 1.2 cm and together with non-mass enhancement measured 8.8 cm, enhancing mass LOQ 1.3 cm, left breast indeterminate 0.9 cm mass and a 0.8 cm mass UOQ, single abnormal lymph node ?Biopsy 10:00, 11:00 and 12:00: IDC with DCIS, biopsy right axillary lymph node: IDC, ER 75 to 95%, PR 90 to 95%, Ki-67 25%, HER2 negative on 1 biopsy and 2+ by IHC and FISH pending ?  ?06/21/2021 Cancer Staging  ? Staging form: Breast, AJCC 8th Edition ?- Clinical stage from 06/21/2021: Stage IIA (cT3, cN1, cM0, G2, ER+, PR+, HER2-) - Signed by Gudena, Vinay, MD on 06/21/2021 ?Histologic grading system: 3 grade system ? ?  ?07/01/2021 Genetic Testing  ? Negative genetic testing:  No pathogenic variants detected on the [facility] BRCAplus panel (report date 07/01/2021) or the [facility] CancerNext-Expanded + RNAinsight panel (report date 07/01/2021).  ? ?The BRCAplus panel offered by [facility] and includes sequencing and deletion/duplication analysis for the following 8 genes: ATM, BRCA1, BRCA2, CDH1, CHEK2, PALB2, PTEN, and TP53. The CancerNext-Expanded + RNAinsight gene panel offered by [facility] and includes sequencing and rearrangement analysis for the following 77 genes: AIP, ALK, APC, ATM, AXIN2, BAP1, BARD1, BLM, BMPR1A, BRCA1, BRCA2, BRIP1, CDC73, CDH1, CDK4, CDKN1B,  CDKN2A, CHEK2, CTNNA1, DICER1, FANCC, FH, FLCN, GALNT12, KIF1B, LZTR1, MAX, MEN1, MET, MLH1, MSH2, MSH3, MSH6, MUTYH, NBN, NF1, NF2, NTHL1, PALB2, PHOX2B, PMS2, POT1, PRKAR1A, PTCH1, PTEN, RAD51C, RAD51D, RB1, RECQL, RET, SDHA, SDHAF2, SDHB, SDHC, SDHD, SMAD4, SMARCA4, SMARCB1, SMARCE1, STK11, SUFU, TMEM127, TP53, TSC1, TSC2, VHL and XRCC2 (sequencing and deletion/duplication); EGFR, EGLN1, HOXB13, KIT, MITF, PDGFRA, POLD1 and POLE (sequencing only); EPCAM and GREM1 (deletion/duplication only). RNA data is routinely analyzed for use in variant interpretation for all genes. ?  ?07/25/2021 Surgery  ? Right mastectomy: Foci of invasive ductal carcinoma measuring 1.1 cm grade 2 with extensive DCIS spanning 8.9 cm, margins negative, 4/7 lymph nodes, ER 75 to 95%, PR 9095%, HER2 negative, Ki-67 25% ?  ?08/25/2021 - 01/19/2022 Adjuvant Reed  ? Adjuvant Reed: dose dense AC x 4 followed by weekly Taxol x 12.  ?  ? ? ?CHIEF COMPLIANT: Follow-up after radiation. ? ?INTERVAL HISTORY: Allison Reed. She presents to the clinic today for a follow-up. She state that she have fatigue. She state that the redness is tolerable. She state that she has hot flashes from the injection but its tolerable. She states her mouth gets dry but its tolerable. ? ? ?ALLERGIES:  is allergic to betadine [povidone iodine] and tape. ? ?MEDICATIONS:  ?Current Outpatient Medications  ?Medication Sig Dispense Refill  ? Empagliflozin-metFORMIN HCl ER (SYNJARDY XR) 09-999 MG TB24 Take by mouth.    ? ONETOUCH VERIO test strip 1 each by Other route 2 (two) times daily.    ? TRADJENTA 5 MG   TABS tablet Take 5 mg by mouth daily.    ? ?No current facility-administered medications for this visit.  ? ? ?PHYSICAL EXAMINATION: ?ECOG PERFORMANCE STATUS: 1 - Symptomatic but completely ambulatory ? ?Vitals:  ? 04/03/22 1429  ?BP: 112/80  ?Pulse:  (!) 120  ?Resp: 18  ?Temp: 97.9 ?F (36.6 ?C)  ?SpO2: 99%  ? ?Filed Weights  ? 04/03/22 1429  ?Weight: 158 lb 3.2 oz (71.8 kg)  ? ?  ? ?LABORATORY DATA:  ?I have reviewed the data as listed ? ?  Latest Ref Rng & Units 03/01/2022  ? 10:27 AM 01/19/2022  ?  8:26 AM 01/12/2022  ?  7:49 AM  ?CMP  ?Glucose 70 - 99 mg/dL 168   219   196    ?BUN 6 - 20 mg/dL 10   10   10    ?Creatinine 0.44 - 1.00 mg/dL 0.55   0.47   0.54    ?Sodium 135 - 145 mmol/L 138   137   139    ?Potassium 3.5 - 5.1 mmol/L 4.1   3.8   4.1    ?Chloride 98 - 111 mmol/L 101   103   105    ?CO2 22 - 32 mmol/L 27   25   25    ?Calcium 8.9 - 10.3 mg/dL 9.7   9.3   9.4    ?Total Protein 6.5 - 8.1 g/dL 6.9   6.6   6.7    ?Total Bilirubin 0.3 - 1.2 mg/dL 0.5   0.4   0.5    ?Alkaline Phos 38 - 126 U/L 63   58   59    ?AST 15 - 41 U/L 18   21   19    ?ALT 0 - 44 U/L 23   27   24    ? ? ?Lab Results  ?Component Value Date  ? WBC 5.4 03/01/2022  ? HGB 13.4 03/01/2022  ? HCT 40.5 03/01/2022  ? MCV 79.3 (L) 03/01/2022  ? PLT 250 03/01/2022  ? NEUTROABS 3.7 03/01/2022  ? ? ?ASSESSMENT & PLAN:  ?Malignant neoplasm of overlapping sites of right breast in female, estrogen receptor positive (HCC) ?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, margins negative, 4/7 lymph nodes, ER 75 to 95%, PR 9095%, HER2 negative, Ki-67 25% ?Genetics: Negative ?MammaPrint: Low risk (this is not a valid test for 4+ lymph nodes) ?Repeat prognostic panel on the lymph node in the final pathology also came back as ER/PR positive ?  ?Treatment plan: ?1.  Adjuvant Reed with dose dense Adriamycin and Cytoxan x4 followed by Taxol weekly x12 started 08/25/2021-01/19/2022 ?2. adjuvant radiation therapy 02/27/2022-04/04/2022 ?3.  Followed by adjuvant antiestrogen therapy with complete estrogen blockade and abemaciclib (discussed oophorectomy versus Zoladex plus letrozole plus  abemaciclib) ?------------------------------------------------------------------------------------------------------------------------ ?Recommendation:  Zoladex injections (letrozole to start in June after she returns back from [place]) ?She has plans to go to [place] in July and might consider starting abemaciclib after she returns back from the trip. ?Monthly for Zoladex injections ?Continue with radiation to be completed 04/04/2022 ?Significant radiation dermatitis. ?Complains of fatigue. ? ?Return to clinic monthly for Zoladex injections and every 3 months for follow-up with me. ? ? ? ?No orders of the defined types were placed in this encounter. ? ?The patient has a good understanding of the overall plan. she agrees with it. she will call with any problems that may develop before the   next visit here. ?Total time spent: 30 mins including face to face time and time spent for planning, charting and co-ordination of care ? ? Viinay K Gudena, MD ?04/03/22 ? ? ? I Deritra, Mcnairy am scribing for Dr. Gudena ? ?I have reviewed the above documentation for accuracy and completeness, and I agree with the above. ?Michelle social worker note that is interested in the next feeding program and also the case recently she has 2 little kids and she wants to. ?

## 2022-03-21 ENCOUNTER — Ambulatory Visit: Payer: 59

## 2022-03-21 ENCOUNTER — Ambulatory Visit
Admission: RE | Admit: 2022-03-21 | Discharge: 2022-03-21 | Disposition: A | Discharge status: Home / Self Care | Payer: 59 | Source: Ambulatory Visit | Attending: Radiation Oncology | Admitting: Radiation Oncology

## 2022-03-21 ENCOUNTER — Other Ambulatory Visit: Payer: Self-pay

## 2022-03-21 DIAGNOSIS — C50811 Malignant neoplasm of overlapping sites of right female breast: Secondary | ICD-10-CM | POA: Diagnosis not present

## 2022-03-22 ENCOUNTER — Ambulatory Visit: Payer: 59

## 2022-03-22 ENCOUNTER — Ambulatory Visit
Admission: RE | Admit: 2022-03-22 | Discharge: 2022-03-22 | Disposition: A | Discharge status: Home / Self Care | Payer: 59 | Source: Ambulatory Visit | Attending: Radiation Oncology | Admitting: Radiation Oncology

## 2022-03-22 DIAGNOSIS — C50811 Malignant neoplasm of overlapping sites of right female breast: Secondary | ICD-10-CM | POA: Diagnosis not present

## 2022-03-23 ENCOUNTER — Ambulatory Visit
Admission: RE | Admit: 2022-03-23 | Discharge: 2022-03-23 | Disposition: A | Discharge status: Home / Self Care | Payer: 59 | Source: Ambulatory Visit | Attending: Radiation Oncology | Admitting: Radiation Oncology

## 2022-03-23 ENCOUNTER — Other Ambulatory Visit: Payer: Self-pay

## 2022-03-23 ENCOUNTER — Ambulatory Visit: Payer: 59

## 2022-03-23 DIAGNOSIS — C50811 Malignant neoplasm of overlapping sites of right female breast: Secondary | ICD-10-CM | POA: Diagnosis not present

## 2022-03-26 ENCOUNTER — Ambulatory Visit
Admission: RE | Admit: 2022-03-26 | Discharge: 2022-03-26 | Disposition: A | Discharge status: Home / Self Care | Payer: 59 | Source: Ambulatory Visit | Attending: Radiation Oncology | Admitting: Radiation Oncology

## 2022-03-26 ENCOUNTER — Ambulatory Visit: Payer: 59

## 2022-03-26 DIAGNOSIS — C50811 Malignant neoplasm of overlapping sites of right female breast: Secondary | ICD-10-CM | POA: Diagnosis not present

## 2022-03-27 ENCOUNTER — Ambulatory Visit: Payer: 59

## 2022-03-27 ENCOUNTER — Other Ambulatory Visit: Payer: Self-pay

## 2022-03-27 ENCOUNTER — Ambulatory Visit
Admission: RE | Admit: 2022-03-27 | Discharge: 2022-03-27 | Disposition: A | Discharge status: Home / Self Care | Payer: 59 | Source: Ambulatory Visit | Attending: Radiation Oncology | Admitting: Radiation Oncology

## 2022-03-27 DIAGNOSIS — C50811 Malignant neoplasm of overlapping sites of right female breast: Secondary | ICD-10-CM | POA: Diagnosis not present

## 2022-03-27 LAB — RAD ONC ARIA SESSION SUMMARY
Course Elapsed Days: 29
Plan Fractions Treated to Date: 11
Plan Fractions Treated to Date: 22
Plan Prescribed Dose Per Fraction: 1.8 Gy
Plan Prescribed Dose Per Fraction: 1.8 Gy
Plan Total Fractions Prescribed: 14
Plan Total Fractions Prescribed: 28
Plan Total Prescribed Dose: 25.2 Gy
Plan Total Prescribed Dose: 50.4 Gy
Reference Point Dosage Given to Date: 39.6 Gy
Reference Point Dosage Given to Date: 39.6 Gy
Reference Point Session Dosage Given: 1.8 Gy
Reference Point Session Dosage Given: 1.8 Gy
Session Number: 22

## 2022-03-28 ENCOUNTER — Ambulatory Visit
Admission: RE | Admit: 2022-03-28 | Discharge: 2022-03-28 | Disposition: A | Discharge status: Home / Self Care | Payer: 59 | Source: Ambulatory Visit | Attending: Radiation Oncology | Admitting: Radiation Oncology

## 2022-03-28 ENCOUNTER — Encounter (HOSPITAL_COMMUNITY): Payer: Self-pay

## 2022-03-28 ENCOUNTER — Ambulatory Visit: Payer: 59

## 2022-03-28 ENCOUNTER — Other Ambulatory Visit: Payer: Self-pay

## 2022-03-28 DIAGNOSIS — C50811 Malignant neoplasm of overlapping sites of right female breast: Secondary | ICD-10-CM | POA: Diagnosis not present

## 2022-03-28 LAB — RAD ONC ARIA SESSION SUMMARY
Course Elapsed Days: 30
Plan Fractions Treated to Date: 12
Plan Fractions Treated to Date: 23
Plan Prescribed Dose Per Fraction: 1.8 Gy
Plan Prescribed Dose Per Fraction: 1.8 Gy
Plan Total Fractions Prescribed: 14
Plan Total Fractions Prescribed: 28
Plan Total Prescribed Dose: 25.2 Gy
Plan Total Prescribed Dose: 50.4 Gy
Reference Point Dosage Given to Date: 41.4 Gy
Reference Point Dosage Given to Date: 41.4 Gy
Reference Point Session Dosage Given: 1.8 Gy
Reference Point Session Dosage Given: 1.8 Gy
Session Number: 23

## 2022-03-29 ENCOUNTER — Other Ambulatory Visit: Payer: Self-pay

## 2022-03-29 ENCOUNTER — Ambulatory Visit: Payer: 59

## 2022-03-29 ENCOUNTER — Ambulatory Visit
Admission: RE | Admit: 2022-03-29 | Discharge: 2022-03-29 | Disposition: A | Discharge status: Home / Self Care | Payer: 59 | Source: Ambulatory Visit | Attending: Radiation Oncology | Admitting: Radiation Oncology

## 2022-03-29 ENCOUNTER — Encounter: Payer: Self-pay | Admitting: *Deleted

## 2022-03-29 DIAGNOSIS — C50811 Malignant neoplasm of overlapping sites of right female breast: Secondary | ICD-10-CM | POA: Diagnosis not present

## 2022-03-29 LAB — RAD ONC ARIA SESSION SUMMARY
Course Elapsed Days: 31
Plan Fractions Treated to Date: 12
Plan Fractions Treated to Date: 24
Plan Prescribed Dose Per Fraction: 1.8 Gy
Plan Prescribed Dose Per Fraction: 1.8 Gy
Plan Total Fractions Prescribed: 14
Plan Total Fractions Prescribed: 28
Plan Total Prescribed Dose: 25.2 Gy
Plan Total Prescribed Dose: 50.4 Gy
Reference Point Dosage Given to Date: 43.2 Gy
Reference Point Dosage Given to Date: 43.2 Gy
Reference Point Session Dosage Given: 1.8 Gy
Reference Point Session Dosage Given: 1.8 Gy
Session Number: 24

## 2022-03-30 ENCOUNTER — Other Ambulatory Visit: Payer: Self-pay

## 2022-03-30 ENCOUNTER — Ambulatory Visit: Payer: 59

## 2022-03-30 ENCOUNTER — Ambulatory Visit
Admission: RE | Admit: 2022-03-30 | Discharge: 2022-03-30 | Disposition: A | Discharge status: Home / Self Care | Payer: 59 | Source: Ambulatory Visit | Attending: Radiation Oncology | Admitting: Radiation Oncology

## 2022-03-30 DIAGNOSIS — C50811 Malignant neoplasm of overlapping sites of right female breast: Secondary | ICD-10-CM | POA: Diagnosis not present

## 2022-03-30 LAB — RAD ONC ARIA SESSION SUMMARY
Course Elapsed Days: 32
Plan Fractions Treated to Date: 13
Plan Fractions Treated to Date: 25
Plan Prescribed Dose Per Fraction: 1.8 Gy
Plan Prescribed Dose Per Fraction: 1.8 Gy
Plan Total Fractions Prescribed: 14
Plan Total Fractions Prescribed: 28
Plan Total Prescribed Dose: 25.2 Gy
Plan Total Prescribed Dose: 50.4 Gy
Reference Point Dosage Given to Date: 45 Gy
Reference Point Dosage Given to Date: 45 Gy
Reference Point Session Dosage Given: 1.8 Gy
Reference Point Session Dosage Given: 1.8 Gy
Session Number: 25

## 2022-04-02 ENCOUNTER — Other Ambulatory Visit: Payer: Self-pay

## 2022-04-02 ENCOUNTER — Ambulatory Visit
Admission: RE | Admit: 2022-04-02 | Discharge: 2022-04-02 | Disposition: A | Discharge status: Home / Self Care | Payer: 59 | Source: Ambulatory Visit | Attending: Radiation Oncology | Admitting: Radiation Oncology

## 2022-04-02 DIAGNOSIS — C50811 Malignant neoplasm of overlapping sites of right female breast: Secondary | ICD-10-CM | POA: Diagnosis not present

## 2022-04-02 DIAGNOSIS — Z170 Estrogen receptor positive status [ER+]: Secondary | ICD-10-CM

## 2022-04-02 LAB — RAD ONC ARIA SESSION SUMMARY
Course Elapsed Days: 35
Plan Fractions Treated to Date: 13
Plan Fractions Treated to Date: 26
Plan Prescribed Dose Per Fraction: 1.8 Gy
Plan Prescribed Dose Per Fraction: 1.8 Gy
Plan Total Fractions Prescribed: 14
Plan Total Fractions Prescribed: 28
Plan Total Prescribed Dose: 25.2 Gy
Plan Total Prescribed Dose: 50.4 Gy
Reference Point Dosage Given to Date: 46.8 Gy
Reference Point Dosage Given to Date: 46.8 Gy
Reference Point Session Dosage Given: 1.8 Gy
Reference Point Session Dosage Given: 1.8 Gy
Session Number: 26

## 2022-04-02 MED ORDER — ALRA NON-METALLIC DEODORANT (RAD-ONC): 1.0000 "application " | Freq: Once | TOPICAL | Status: DC

## 2022-04-02 MED ORDER — RADIAPLEXRX EX GEL: Freq: Once | CUTANEOUS | Status: DC

## 2022-04-03 ENCOUNTER — Inpatient Hospital Stay (HOSPITAL_BASED_OUTPATIENT_CLINIC_OR_DEPARTMENT_OTHER): Payer: 59 | Admitting: Hematology and Oncology

## 2022-04-03 ENCOUNTER — Inpatient Hospital Stay: Payer: 59

## 2022-04-03 ENCOUNTER — Ambulatory Visit: Payer: 59

## 2022-04-03 ENCOUNTER — Ambulatory Visit
Admission: RE | Admit: 2022-04-03 | Discharge: 2022-04-03 | Disposition: A | Discharge status: Home / Self Care | Payer: 59 | Source: Ambulatory Visit | Attending: Radiation Oncology | Admitting: Radiation Oncology

## 2022-04-03 ENCOUNTER — Other Ambulatory Visit: Payer: Self-pay

## 2022-04-03 DIAGNOSIS — C50811 Malignant neoplasm of overlapping sites of right female breast: Secondary | ICD-10-CM

## 2022-04-03 DIAGNOSIS — Z170 Estrogen receptor positive status [ER+]: Secondary | ICD-10-CM

## 2022-04-03 LAB — RAD ONC ARIA SESSION SUMMARY
Course Elapsed Days: 36
Plan Fractions Treated to Date: 14
Plan Fractions Treated to Date: 27
Plan Prescribed Dose Per Fraction: 1.8 Gy
Plan Prescribed Dose Per Fraction: 1.8 Gy
Plan Total Fractions Prescribed: 14
Plan Total Fractions Prescribed: 28
Plan Total Prescribed Dose: 25.2 Gy
Plan Total Prescribed Dose: 50.4 Gy
Reference Point Dosage Given to Date: 48.6 Gy
Reference Point Dosage Given to Date: 48.6 Gy
Reference Point Session Dosage Given: 1.8 Gy
Reference Point Session Dosage Given: 1.8 Gy
Session Number: 27

## 2022-04-03 NOTE — Assessment & Plan Note (Addendum)
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, margins negative, 4/7 lymph nodes, ER 75 to 95%, PR 9095%, HER2 negative, Ki-67 25% ?Genetics: Negative ?MammaPrint: Low risk (this is not a valid test for 4+ lymph nodes) ?Repeat prognostic panel on the lymph node in the final pathology also came back as ER/PR positive ?? ?Treatment plan: ?1.??Adjuvant chemotherapy with dose dense Adriamycin and Cytoxan x4 followed by Taxol weekly x12?started 08/25/2021-01/19/2022 ?2.?adjuvant radiation therapy 02/27/2022-04/04/2022 ?3.??Followed by adjuvant antiestrogen therapy with complete estrogen blockade and abemaciclib?(discussed oophorectomy versus Zoladex plus letrozole plus abemaciclib) ?------------------------------------------------------------------------------------------------------------------------ ?Recommendation:  Zoladex injections (letrozole to start in 2 weeks) ?She has plans to go to [facility] in July and might consider starting abemaciclib after she returns back from the trip. ?Monthly for Zoladex injections ?Continue with radiation to be completed 04/04/2022 ? ?Return to clinic monthly for Zoladex injections and every 3 months for follow-up with me. ?

## 2022-04-04 ENCOUNTER — Encounter: Payer: Self-pay | Admitting: Radiation Oncology

## 2022-04-04 ENCOUNTER — Telehealth: Payer: Self-pay | Admitting: Hematology and Oncology

## 2022-04-04 ENCOUNTER — Other Ambulatory Visit: Payer: Self-pay

## 2022-04-04 ENCOUNTER — Ambulatory Visit: Payer: 59

## 2022-04-04 ENCOUNTER — Ambulatory Visit
Admission: RE | Admit: 2022-04-04 | Discharge: 2022-04-04 | Disposition: A | Discharge status: Home / Self Care | Payer: 59 | Source: Ambulatory Visit | Attending: Radiation Oncology | Admitting: Radiation Oncology

## 2022-04-04 DIAGNOSIS — C50811 Malignant neoplasm of overlapping sites of right female breast: Secondary | ICD-10-CM | POA: Diagnosis not present

## 2022-04-04 LAB — RAD ONC ARIA SESSION SUMMARY
Course Elapsed Days: 37
Plan Fractions Treated to Date: 14
Plan Fractions Treated to Date: 28
Plan Prescribed Dose Per Fraction: 1.8 Gy
Plan Prescribed Dose Per Fraction: 1.8 Gy
Plan Total Fractions Prescribed: 14
Plan Total Fractions Prescribed: 28
Plan Total Prescribed Dose: 25.2 Gy
Plan Total Prescribed Dose: 50.4 Gy
Reference Point Dosage Given to Date: 50.4 Gy
Reference Point Dosage Given to Date: 50.4 Gy
Reference Point Session Dosage Given: 1.8 Gy
Reference Point Session Dosage Given: 1.8 Gy
Session Number: 28

## 2022-04-04 NOTE — Telephone Encounter (Signed)
Scheduled appointment per 4/25 los. Left message. ?

## 2022-04-05 ENCOUNTER — Telehealth: Payer: Self-pay | Admitting: *Deleted

## 2022-04-05 ENCOUNTER — Ambulatory Visit: Payer: 59

## 2022-04-05 NOTE — Telephone Encounter (Signed)
CALLED PATIENT TO INFORM THAT FU ON 05-09-22 HAS BEEN MOVED TO 05-16-22 @ 4 PM, SPOKE WITH PATIENT AND SHE IS AWARE OF THIS CHANGE AND IS GOOD WITH IT ?

## 2022-04-11 ENCOUNTER — Other Ambulatory Visit: Payer: Self-pay

## 2022-04-11 ENCOUNTER — Inpatient Hospital Stay: Payer: 59 | Attending: Hematology and Oncology

## 2022-04-11 VITALS — BP 115/79 | HR 93 | Temp 97.7°F | Resp 18

## 2022-04-11 DIAGNOSIS — Z79818 Long term (current) use of other agents affecting estrogen receptors and estrogen levels: Secondary | ICD-10-CM | POA: Insufficient documentation

## 2022-04-11 DIAGNOSIS — C50811 Malignant neoplasm of overlapping sites of right female breast: Secondary | ICD-10-CM | POA: Diagnosis present

## 2022-04-11 DIAGNOSIS — Z170 Estrogen receptor positive status [ER+]: Secondary | ICD-10-CM

## 2022-04-11 MED ORDER — GOSERELIN ACETATE 3.6 MG ~~LOC~~ IMPL
3.6000 mg | DRUG_IMPLANT | Freq: Once | SUBCUTANEOUS | Status: AC
  Administered 2022-04-11: 3.6 mg via SUBCUTANEOUS
  Filled 2022-04-11: qty 3.6

## 2022-04-16 ENCOUNTER — Ambulatory Visit: Payer: Self-pay

## 2022-04-17 ENCOUNTER — Ambulatory Visit: Payer: Self-pay | Admitting: Radiation Oncology

## 2022-04-23 ENCOUNTER — Ambulatory Visit: Payer: 59 | Attending: Plastic Surgery

## 2022-04-23 VITALS — Wt 155.5 lb

## 2022-04-23 DIAGNOSIS — C50811 Malignant neoplasm of overlapping sites of right female breast: Secondary | ICD-10-CM | POA: Insufficient documentation

## 2022-04-23 DIAGNOSIS — R293 Abnormal posture: Secondary | ICD-10-CM | POA: Insufficient documentation

## 2022-04-23 DIAGNOSIS — M25611 Stiffness of right shoulder, not elsewhere classified: Secondary | ICD-10-CM | POA: Diagnosis present

## 2022-04-23 DIAGNOSIS — Z170 Estrogen receptor positive status [ER+]: Secondary | ICD-10-CM | POA: Diagnosis present

## 2022-04-23 NOTE — Therapy (Addendum)
[facility] @ [facility] [address] [place], [place], [address] Phone: [phone]   Fax:  [phone]  Physical Therapy Treatment  Patient Details  Name: Allison Reed MRN: [id number] Date of Birth: [birth year] Referring Provider (PT): Dr. Dwain Sarna   Encounter Date: 04/23/2022   PT End of Session - 04/23/22 0843     Visit Number 23    Number of Visits 23    Date for PT Re-Evaluation 03/22/22   D/C this visit   PT Start Time 0801    PT Stop Time 0840    PT Time Calculation (min) 39 min    Activity Tolerance Patient tolerated treatment well    Behavior During Therapy [facility] for tasks assessed/performed             Past Medical History:  Diagnosis Date   Diabetes mellitus without complication (HCC)    Type 2   Family history of brain tumor    Family history of skin cancer    Gestational diabetes    Headache    otc med prn   Heartburn in pregnancy    Incompetent cervix in pregnancy 04/2015   Missed abortion    no surgery required   Postpartum care following cesarean delivery (11/2) 10/12/2015   right breast ca 06/2021   UTI in pregnancy 03/2015   recent UTI, treatment completed    Past Surgical History:  Procedure Laterality Date   APPENDECTOMY  2006   BREAST RECONSTRUCTION WITH PLACEMENT OF TISSUE EXPANDER AND ALLODERM Right 07/25/2021   Procedure: RIGHT BREAST RECONSTRUCTION WITH PLACEMENT OF TISSUE EXPANDER AND ALLODERM;  Surgeon: Glenna Fellows, MD;  Location: [facility];  Service: Plastics;  Laterality: Right;   CERCLAGE LAPAROSCOPIC ABDOMINAL N/A 04/15/2015   Procedure: LAPAROSCOPIC TRANSABDOMINAL CERVCOISTHMIC CERCLAGE;  Surgeon: Fermin Schwab, MD;  Location: [facility];  Service: Gynecology;  Laterality: N/A;   CERVICAL CERCLAGE  02/2011   vaginal   CESAREAN SECTION N/A 10/12/2015   Procedure: CESAREAN SECTION;  Surgeon: Genia Del, MD;  Location: [facility];  Service: Obstetrics;  Laterality:  N/A;   CESAREAN SECTION N/A 03/03/2018   Procedure: Repeat CESAREAN SECTION/Removal of Abdominal Cerclage;  Surgeon: Shea Evans, MD;  Location: [facility];  Service: Obstetrics;  Laterality: N/A;  EDD: 03/28/18   MASTECTOMY W/ SENTINEL NODE BIOPSY Right 07/25/2021   Procedure: RIGHT MASTECTOMY WITH AXILLARY SENTINEL LYMPH NODE BIOPSY;  Surgeon: Emelia Loron, MD;  Location: [facility];  Service: General;  Laterality: Right;   PORTACATH PLACEMENT N/A 08/24/2021   Procedure: INSERTION PORT-A-CATH;  Surgeon: Emelia Loron, MD;  Location: [facility];  Service: General;  Laterality: N/A;   RADIOACTIVE SEED GUIDED AXILLARY SENTINEL LYMPH NODE Right 07/25/2021   Procedure: RADIOACTIVE SEED GUIDED RIGHT AXILLARY NODE EXCISION;  Surgeon: Emelia Loron, MD;  Location: [facility];  Service: General;  Laterality: Right;   TISSUE EXPANDER PLACEMENT Right 12/04/2021   Procedure: REMOVAL RIGHT CHEST TISSUE EXPANDER;  Surgeon: Glenna Fellows, MD;  Location: [facility];  Service: Plastics;  Laterality: Right;   WISDOM TOOTH EXTRACTION  2001    Vitals:   04/23/22 0807  Weight: 155 lb 8 oz (70.5 kg)     Subjective Assessment - 04/23/22 0844     Subjective I had to cancel my last few appts because my skin was so badly burnt from radiation. But my cording is much improved and I just have some tightness now at my Rt upper trap  area.    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 She noted a palpable right breast mass. This is followed by a mammogram with a mass with calcifications as really throughout the upper outer quadrant and in the central breast. This measures 9.4 x 8.7 x 6.7 cm. On ultrasound she had 3 separate masses at 10, 11, and 12:00. The sizes are 1.7, 2.1, and 2.2 cm. There is also a single abnormal axillary lymph node that has undergone biopsy. 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 She is s/p right breast mastectomy with SLNB 0/7 LN on August 16 andl also had tissue expanders in place.  She is currently undergoing chemotherapy for 6 months every other week and radiation will follow; emergency sugery to remove expander 12/04/21; completed radiation 04/04/22    Patient Stated Goals Reassess after surgery    Currently in Pain? No/denies                    L-DEX FLOWSHEETS - 04/23/22 0800       L-DEX LYMPHEDEMA SCREENING   Measurement Type Unilateral    L-DEX MEASUREMENT EXTREMITY Upper Extremity    DOMINANT SIDE Right    At Risk Side Right    BASELINE SCORE (UNILATERAL) 0    L-DEX SCORE (UNILATERAL) 2.4    VALUE CHANGE (UNILAT) 2.4                       [facility] Adult PT Treatment/Exercise - 04/23/22 0001       Manual Therapy   Soft tissue mobilization Initially in supine with cocoa butter to Rt axilla assessing for any tightness/cording. Pt reports no tightness here so then into Lt S/L for STM with cocoa butter to Rt upper trap where tightness palpable but avoided area where skin still healing from radiation. then finished in supine again with suboccipital release and cervical Lt side bend and rotation. Pt reports feeling much looser by end of session.                          PT Long Term Goals - 04/23/22 0857       PT LONG TERM GOAL #1   Title Pt will achieve full right shoulder ROM post surgery for full return to daily activities    Baseline All Rt shoulder A/ROM WNLs - 04/23/22    Status Achieved      PT LONG TERM GOAL #2   Title Pt will improve right shoulder flexion and scaption by atleast 15 degrees    Baseline regressed  today secondary to cording; Rt shoulder A/ROM all WNLs and pt repors no pull from cording noted - 04/23/22    Status Achieved      PT LONG TERM GOAL #3   Title Pt will report decrease achiness and pulling from cording by 50% or more    Baseline 95% better; pt reports feeling no ncording at this time - 04/23/22    Status Achieved      PT LONG TERM GOAL #4   Title Pt will be able to perform arm  movements to continue with her [place] dancing    Baseline shoulder ROM is likely good for [place] dancing, but she is still fatigued from chemo. 3 more left; pt reports feeling no limitations with this activity now - 04/23/22    Status Achieved      PT LONG TERM GOAL #5   Title Pt  will progress through ABC strength class or other scapular/shoulder strengthening    Baseline pt is independent with HEP for scapular and Rt UE strength - 04/23/22    Status Achieved                   Plan - 04/23/22 0853     Clinical Impression Statement Pt returns after having completed radiation almost 3 weeks ago. She missed last few appts during that time to allow skin more time to heal. Her SOZO reassessed today and her change from baseline of 2.4 is WNLs and she has met all goals at this time. Pt is ready for D/C from treatment but will cont every 3 month SOZO screens and her next one was scheduled before leaving today.    Personal Factors and Comorbidities Comorbidity 1    Comorbidities right breast cancer s/p mastectomy; presently having chemo and pending radiation; recent emergency surgery to remove expander    Stability/Clinical Decision Making Stable/Uncomplicated    Rehab Potential Excellent    PT Frequency 2x / week    PT Duration 4 weeks    PT Treatment/Interventions ADLs/Self Care Home Management;Therapeutic exercise;Manual techniques;Patient/family education;Manual lymph drainage;Scar mobilization;Passive range of motion;Moist Heat    PT Next Visit Plan D/C this visit from treatment but cont every 3  month SOZO screens for up to 2 years from her SLNB.    PT Home Exercise Plan 4 post op exs , supine wand flexion and scaption; modified downward dog on wall, supine scapular series; Strength ABC program    Consulted and Agree with Plan of Care Patient             Patient will benefit from skilled therapeutic intervention in order to improve the following deficits and impairments:  Postural dysfunction, Decreased knowledge of precautions, Decreased scar mobility, Increased fascial restricitons, Decreased range of motion  Visit Diagnosis: Malignant neoplasm of overlapping sites of right breast in female, estrogen receptor positive (HCC)  Stiffness of right shoulder, not elsewhere classified  Abnormal posture     Problem List Patient Active Problem List   Diagnosis Date Noted   Port-A-Cath in place 08/25/2021   S/P mastectomy, right 07/25/2021   Genetic testing 07/01/2021   Family history of skin cancer 06/22/2021   Family history of brain tumor 06/22/2021   Malignant neoplasm of overlapping sites of right breast in female, estrogen receptor positive (HCC) 06/21/2021   Status post repeat low transverse cesarean section 03/03/2018   DM (diabetes mellitus) in pregnancy 03/02/2018   Incompetent cervix in pregnancy 03/02/2018  PHYSICAL THERAPY DISCHARGE SUMMARY  Visits from Start of Care: 23  Current functional level related to goals / functional outcomes: Pt has achieved all goals established   Remaining deficits: Tightness Right UT. Pt will continue every 3 month SOZO screens until 2 years from surgery date.   Education / Equipment: HEP, lymphedema    Patient agrees to discharge. Patient goals were met. Patient is being discharged due to meeting the stated rehab goals.   Hermenia Bers, PTA 04/23/2022, 9:00 AM  [facility] [address]  [place], [place], [address] Phone: [phone]   Fax:   [phone]  Name: Allison Reed MRN: [id number] Date of Birth: 04-28-1976

## 2022-04-30 NOTE — Progress Notes (Unsigned)
[name]: [phone] Subjective:   [name], am serving as a scribe for Allison. Hulan Saas.   I'm seeing this patient by the request  of:  Allison Bowen, MD  CC: Right leg pain and swelling  XIP:[id number]  Allison Reed is a 46 y.o. female coming in with complaint of L shin pain. Patient is a runner. Patient states that she has been having swelling over localized area in distal 1/3 of L medial tibia for past year. Pain develops when swelling occurs.     Zoladex every 3 months - one side effect is peripheral edema last infusion 5/3 has only had 2 doses of this.  Patient has overall doing relatively well with laboratory work-up as well.  Reviewed patient's bone densities that were unremarkable CT scans of the abdomen and pelvis that were also unremarkable for any type of metastasis.  X-rays of the tib-fib were independently visualized by me showing no cortical irregularity     Past Medical History:  Diagnosis Date   Diabetes mellitus without complication ([place])    Type 2   Family history of brain tumor    Family history of skin cancer    Gestational diabetes    Headache    otc med prn   Heartburn in pregnancy    Incompetent cervix in pregnancy 04/2015   Missed abortion    no surgery required   Postpartum care following cesarean delivery (11/2) 10/12/2015   right breast ca 06/2021   UTI in pregnancy 03/2015   recent UTI, treatment completed   Past Surgical History:  Procedure Laterality Date   APPENDECTOMY  2006   BREAST RECONSTRUCTION WITH PLACEMENT OF TISSUE EXPANDER AND ALLODERM Right 07/25/2021   Procedure: RIGHT BREAST RECONSTRUCTION WITH PLACEMENT OF TISSUE EXPANDER AND ALLODERM;  Surgeon: Irene Limbo, MD;  Location: [facility];  Service: Plastics;  Laterality: Right;   CERCLAGE LAPAROSCOPIC ABDOMINAL N/A 04/15/2015   Procedure: LAPAROSCOPIC TRANSABDOMINAL  CERVCOISTHMIC CERCLAGE;  Surgeon: Governor Specking, MD;  Location: [facility];  Service: Gynecology;  Laterality: N/A;   CERVICAL CERCLAGE  02/2011   vaginal   CESAREAN SECTION N/A 10/12/2015   Procedure: CESAREAN SECTION;  Surgeon: Princess Bruins, MD;  Location: [facility];  Service: Obstetrics;  Laterality: N/A;   CESAREAN SECTION N/A 03/03/2018   Procedure: Repeat CESAREAN SECTION/Removal of Abdominal Cerclage;  Surgeon: Azucena Fallen, MD;  Location: [facility];  Service: Obstetrics;  Laterality: N/A;  EDD: 03/28/18   MASTECTOMY W/ SENTINEL NODE BIOPSY Right 07/25/2021   Procedure: RIGHT MASTECTOMY WITH AXILLARY SENTINEL LYMPH NODE BIOPSY;  Surgeon: [name], MD;  Location: [facility];  Service: General;  Laterality: Right;   PORTACATH PLACEMENT N/A 08/24/2021   Procedure: INSERTION PORT-A-CATH;  Surgeon: [name], MD;  Location: [facility];  Service: General;  Laterality: N/A;   RADIOACTIVE SEED GUIDED AXILLARY SENTINEL LYMPH NODE Right 07/25/2021   Procedure: RADIOACTIVE SEED GUIDED RIGHT AXILLARY NODE EXCISION;  Surgeon: [name], MD;  Location: [facility];  Service: General;  Laterality: Right;   TISSUE EXPANDER PLACEMENT Right 12/04/2021   Procedure: REMOVAL RIGHT CHEST TISSUE EXPANDER;  Surgeon: Irene Limbo, MD;  Location: [facility];  Service: Plastics;  Laterality: Right;   WISDOM TOOTH EXTRACTION  2001   Social History   Socioeconomic History   Marital status: Married    Spouse name: Allison Reed   Number of children:  Not on file   Years of education: Not on file   Highest education level: Not on file  Occupational History   Not on file  Tobacco Use   Smoking status: Never   Smokeless tobacco: Never  Vaping Use   Vaping Use: Never used  Substance and Sexual Activity   Alcohol use: Yes    Comment: social   Drug use: No   Sexual activity: Yes    Birth control/protection: None  Other Topics  Concern   Not on file  Social History Narrative   Not on file   Social Determinants of Health   Financial Resource Strain: Not on file  Food Insecurity: Not on file  Transportation Needs: Not on file  Physical Activity: Not on file  Stress: Not on file  Social Connections: Not on file   Allergies  Allergen Reactions   Betadine [Povidone Iodine] Rash   Tape Rash   Family History  Problem Relation Age of Onset   Diabetes Father    HIV Maternal Uncle    Skin cancer Maternal Uncle 39   HIV Maternal Uncle    Diabetes Maternal Grandmother    Hypertension Maternal Grandmother    Alzheimer's disease Maternal Grandmother    Cirrhosis Maternal Grandfather    Alcoholism Maternal Grandfather    Other Paternal Grandfather        brain hemorrhage    Current Outpatient Medications (Endocrine & Metabolic):    Empagliflozin-metFORMIN HCl ER (SYNJARDY XR) 09-999 MG TB24, Take by mouth.   TRADJENTA 5 MG TABS tablet, Take 5 mg by mouth daily.      Current Outpatient Medications (Other):    ONETOUCH VERIO test strip, 1 each by Other route 2 (two) times daily.   Reviewed prior external information including notes and imaging from  primary care provider As well as notes that were available from care everywhere and other healthcare systems.  Past medical history, social, surgical and family history all reviewed in electronic medical record.  No pertanent information unless stated regarding to the chief complaint.   Review of Systems:  No headache, visual changes, nausea, vomiting, diarrhea, constipation, dizziness, abdominal pain, skin rash, fevers, chills, night sweats, weight loss, swollen lymph nodes, body aches,chest pain, shortness of breath, mood changes. POSITIVE muscle aches and joint swelling and leg swelling  Objective  Blood pressure 110/82, pulse (!) 104, height '5\' 7"'$  (1.702 m), weight 158 lb (71.7 kg), SpO2 98 %.   General: No apparent distress alert and oriented x3  mood and affect normal, dressed appropriately.  HEENT: Pupils equal, extraocular movements intact  Respiratory: Patient's speak in full sentences and does not appear short of breath  Cardiovascular: No lower extremity edema, non tender, no erythema  Gait normal with good balance and coordination.  MSK: Right leg exam does not have any significant changes in the skin color on exam.  Patient has good range of motion of the knee noted.  Neurovascular intact at the ankle and the foot.  Good capillary refill.  Patient does have good pulses.  On the medial aspect of the most proximal aspect of the distal third of the tibia on the medial spine patient does have a small palpable mass noted that is tender.  Minorly fluctuant around the area but no warmness.  No breakdown of the skin in the area.  Approximately 1 cm in diameter no true edema noted of the lower extremities no calf pain.  Limited muscular skeletal ultrasound was performed and interpreted by  Hulan Saas, M  Limited ultrasound shows the patient does have some mild soft tissue changes in the area that seem to be calcific.  Patient does have increasing neovascularization in Doppler flow going to the cortex of the bone and there is a cortex irregularity noted. Impression: Cortical irregularity of the proximal distal third of the tibia with increasing neovascularization and abnormal calcific changes of the soft tissue    Impression and Recommendations:     The above documentation has been reviewed and is accurate and complete Lyndal Pulley, DO

## 2022-05-01 ENCOUNTER — Encounter: Payer: Self-pay | Admitting: Hematology and Oncology

## 2022-05-01 NOTE — Progress Notes (Signed)
                                                                                                                                                             Patient Name: Allison Reed MRN: [id number] DOB: 1976/05/01 Referring Physician: [name] (Profile Not Attached) Date of Service: 04/04/2022 [facility], [place]                                                        End Of Treatment Note  Diagnoses: C50.811-Malignant neoplasm of overlapping sites of right female breast  Cancer Staging:  Cancer Staging  Malignant neoplasm of overlapping sites of right breast in female, estrogen receptor positive ([place]) Staging form: Breast, AJCC 8th Edition - Clinical stage from 06/21/2021: Stage IIA (cT3, cN1, cM0, G2, ER+, PR+, HER2-) - Signed by [name], MD on 06/21/2021 Histologic grading system: 3 grade system - Pathologic: Stage IB (pT1c, pN2a(sn), cM0, G2, ER+, PR+, HER2-) - Unsigned Method of lymph node assessment: Sentinel lymph node biopsy Histologic grading system: 3 grade system   Intent: Curative  Radiation Treatment Dates: 02/26/2022 through 04/04/2022 Site Technique Total Dose (Gy) Dose per Fx (Gy) Completed Fx Beam Energies  Chest Wall, Right: CW_R_IMN 3D 50.4/50.4 1.8 28/28 6X  Chest Wall, Right: CW_R_PAB_SCV 3D 50.4/50.4 1.8 28/28 6X, 10X   Narrative: The patient tolerated radiation therapy relatively well.   Plan: The patient will follow-up with radiation oncology in 47mo .  -----------------------------------  Eppie Gibson, MD

## 2022-05-02 ENCOUNTER — Ambulatory Visit: Payer: Self-pay

## 2022-05-02 ENCOUNTER — Encounter: Payer: Self-pay | Admitting: Family Medicine

## 2022-05-02 ENCOUNTER — Ambulatory Visit (INDEPENDENT_AMBULATORY_CARE_PROVIDER_SITE_OTHER): Payer: 59 | Admitting: Family Medicine

## 2022-05-02 VITALS — BP 110/82 | HR 104 | Ht 67.0 in | Wt 158.0 lb

## 2022-05-02 DIAGNOSIS — M25562 Pain in left knee: Secondary | ICD-10-CM | POA: Diagnosis not present

## 2022-05-02 DIAGNOSIS — R2242 Localized swelling, mass and lump, left lower limb: Secondary | ICD-10-CM | POA: Diagnosis not present

## 2022-05-02 DIAGNOSIS — Z853 Personal history of malignant neoplasm of breast: Secondary | ICD-10-CM | POA: Diagnosis not present

## 2022-05-02 DIAGNOSIS — M25561 Pain in right knee: Secondary | ICD-10-CM | POA: Diagnosis not present

## 2022-05-02 NOTE — Assessment & Plan Note (Signed)
Past medical history significant for breast cancer but bone scan previously was unremarkable.  Patient was not having another.  What is somewhat concerning as patient states during chemotherapy the bump went away but patient was not running during that time.  During radiation unfortunately it seemed to come back.  Since then patient has been doing walking and has noticed more discomfort and pain somewhat or more frequently of the swelling.  Still has not been running as much as she would like to.  We discussed with patient that there is a possibility for a nonhealing stress reaction based on the ultrasound.  Due to the past medical history of noted of the breast cancer I do feel strongly that a MRI is necessary to further evaluate.  The bone scan was last nearly 1 year ago at this point.  We will do the MRI with contrast to further evaluate depending on findings we will discuss medical management thereafter

## 2022-05-02 NOTE — Patient Instructions (Addendum)
HOKA or OOFOS recovery sandals in the house Compression sleeve or socks with workouts Tib fib MRI with contrast [phone] We will be in touch with results If stress fx consider bone density and bone stimulator

## 2022-05-04 NOTE — Addendum Note (Signed)
Addended by: Pollyann Glen on: 05/04/2022 10:53 AM   Modules accepted: Orders

## 2022-05-09 ENCOUNTER — Ambulatory Visit: Payer: Self-pay | Admitting: Radiation Oncology

## 2022-05-16 ENCOUNTER — Inpatient Hospital Stay: Payer: 59

## 2022-05-16 ENCOUNTER — Ambulatory Visit
Admission: RE | Admit: 2022-05-16 | Discharge: 2022-05-16 | Disposition: A | Discharge status: Home / Self Care | Payer: 59 | Source: Ambulatory Visit | Attending: Radiation Oncology | Admitting: Radiation Oncology

## 2022-05-16 ENCOUNTER — Encounter: Payer: Self-pay | Admitting: Radiation Oncology

## 2022-05-16 ENCOUNTER — Other Ambulatory Visit: Payer: Self-pay

## 2022-05-16 VITALS — BP 106/79 | HR 92 | Temp 98.0°F | Resp 18

## 2022-05-16 DIAGNOSIS — Z170 Estrogen receptor positive status [ER+]: Secondary | ICD-10-CM

## 2022-05-16 DIAGNOSIS — C50811 Malignant neoplasm of overlapping sites of right female breast: Secondary | ICD-10-CM | POA: Insufficient documentation

## 2022-05-16 DIAGNOSIS — Z79818 Long term (current) use of other agents affecting estrogen receptors and estrogen levels: Secondary | ICD-10-CM | POA: Insufficient documentation

## 2022-05-16 MED ORDER — GOSERELIN ACETATE 3.6 MG ~~LOC~~ IMPL
3.6000 mg | DRUG_IMPLANT | Freq: Once | SUBCUTANEOUS | Status: AC
  Administered 2022-05-16: 3.6 mg via SUBCUTANEOUS
  Filled 2022-05-16: qty 3.6

## 2022-05-16 NOTE — Progress Notes (Signed)
Allison Reed presents today for follow-up after completing radiation to her right breast on 04/04/2022  Pain: Reports occasional twinges, and tightness to chest, but  Skin: Lingering hyperpigmentation (particularly in axilla area), but otherwise skin appears intact and well healed ROM: Reports some tightness to her chest and back of right arm, but states her range of motion is back to before her treatment Lymphedema: Denies MedOnc F/U: Scheduled for Survivorship Care Plan visit with Mendel Ryder Causey-NP on 06/18/2022 Other issues of note: Still has some lingering fatigue, but feels she's able to manage. Overall reports she's doing well and feels good  Pt reports Yes No Comments  Tamoxifen '[]'$  '[x]'$    Letrozole '[x]'$  '[]'$  Waiting to start until her Survivorship appointment in July  Anastrazole '[]'$  '[x]'$    Mammogram '[]'$  Date:  '[]'$ 

## 2022-05-16 NOTE — Progress Notes (Signed)
Radiation Oncology         [phone] ________________________________  Name: Allison Reed MRN: [id number]  Date: 05/16/2022  DOB: July 02, 1976  Follow-Up Visit Note  Outpatient  CC: [name], MD  [name], MD  Diagnosis and Prior Radiotherapy:    ICD-10-CM   1. Malignant neoplasm of overlapping sites of right breast in female, estrogen receptor positive ([place])  C50.811    Z17.0       CHIEF COMPLAINT: Here for follow-up and surveillance of breast cancer  Narrative:  The patient returns today for routine follow-up.  Allison Reed presents today for follow-up after completing radiation to her right breast on 04/04/2022  Pain: Reports occasional twinges, and tightness to chest, but  Skin: Lingering hyperpigmentation (particularly in axilla area), but otherwise skin appears intact and well healed ROM: Reports some tightness to her chest and back of right arm, but states her range of motion is back to before her treatment Lymphedema: Denies MedOnc F/U: Scheduled for Survivorship Care Plan visit with Mendel Ryder Causey-NP on 06/18/2022 Other issues of note: Still has some lingering fatigue, but feels she's able to manage. Overall reports she's doing well and feels good  Pt reports Yes No Comments  Tamoxifen '[]'$  '[x]'$    Letrozole '[x]'$  '[]'$  Waiting to start until her Survivorship appointment in July  Anastrazole '[]'$  '[x]'$    Mammogram '[]'$  Date:  '[]'$                                   ALLERGIES:  is allergic to betadine [povidone iodine] and tape.  Meds: Current Outpatient Medications  Medication Sig Dispense Refill   Empagliflozin-metFORMIN HCl ER (SYNJARDY XR) 09-999 MG TB24 Take by mouth.     ONETOUCH VERIO test strip 1 each by Other route 2 (two) times daily.     TRADJENTA 5 MG TABS tablet Take 5 mg by mouth daily.     No current facility-administered medications for this encounter.    Physical Findings: The patient is in no acute distress. Patient is alert and oriented.   vitals were not taken for this visit. .    Satisfactory skin healing in radiotherapy fields. There is still hyperpigmentation. Skin is smooth, no peeling.   Lab Findings: Lab Results  Component Value Date   WBC 5.4 03/01/2022   HGB 13.4 03/01/2022   HCT 40.5 03/01/2022   MCV 79.3 (L) 03/01/2022   PLT 250 03/01/2022    Radiographic Findings: [place] LIMITED JOINT SPACE STRUCTURES LOW BILAT(NO LINKED CHARGES)  Result Date: 05/07/2022 Table formatting from the original result was not included. Limited muscular skeletal ultrasound was performed and interpreted by Hulan Saas, M Limited ultrasound shows the patient does have some mild soft tissue changes in the area that seem to be calcific.  Patient does have increasing neovascularization in Doppler flow going to the cortex of the bone and there is a cortex irregularity noted. Impression: Cortical irregularity of the proximal distal third of the tibia with increasing neovascularization and abnormal calcific changes of the soft tissue   Impression and Recommendations:     Impression/Plan: Healing well from radiotherapy to the breast tissue.  Continue skin care with topical Vitamin E Oil  for at least 2 more months for further healing. If pigment does not improve satisfactorily within 4 mo, a dermatology referral is an option.  I encouraged her to continue followup with medical oncology. I will see her back  on an as-needed basis. I have encouraged her to call if she has any issues or concerns in the future. I wished her the very best.  I agree that an MRI for the soft tissue swelling in her leg (ordered by Ortho) is a good idea for closure on the mass of unknown etiology despite previous studies.  On date of service, in total, I spent 25 minutes on this encounter. Patient was seen in person.  _____________________________________   Eppie Gibson, MD

## 2022-05-25 ENCOUNTER — Ambulatory Visit
Admission: RE | Admit: 2022-05-25 | Discharge: 2022-05-25 | Disposition: A | Discharge status: Home / Self Care | Payer: 59 | Source: Ambulatory Visit | Attending: Family Medicine | Admitting: Family Medicine

## 2022-05-25 DIAGNOSIS — M25562 Pain in left knee: Secondary | ICD-10-CM

## 2022-05-25 DIAGNOSIS — R2242 Localized swelling, mass and lump, left lower limb: Secondary | ICD-10-CM

## 2022-05-25 MED ORDER — GADOBENATE DIMEGLUMINE 529 MG/ML IV SOLN
14.0000 mL | Freq: Once | INTRAVENOUS | Status: AC | PRN
Start: 2022-05-25 — End: 2022-05-25
  Administered 2022-05-25: 14 mL via INTRAVENOUS

## 2022-06-07 ENCOUNTER — Other Ambulatory Visit: Payer: Self-pay | Admitting: *Deleted

## 2022-06-07 DIAGNOSIS — Z170 Estrogen receptor positive status [ER+]: Secondary | ICD-10-CM

## 2022-06-07 NOTE — Therapy (Signed)
Dr OUTPATIENT PHYSICAL THERAPY ONCOLOGY EVALUATION  Patient Name: Allison Reed MRN: [id number] DOB:07/25/1976, 46 y.o., female Today's Date: 06/08/2022   PT End of Session - 06/08/22 0756     Visit Number 1    Number of Visits 12    Date for PT Re-Evaluation 07/20/22    PT Start Time 0756    PT Stop Time 0859    PT Time Calculation (min) 63 min    Activity Tolerance Patient tolerated treatment well    Behavior During Therapy [facility] for tasks assessed/performed             Past Medical History:  Diagnosis Date   Diabetes mellitus without complication ([place])    Type 2   Family history of brain tumor    Family history of skin cancer    Gestational diabetes    Headache    otc med prn   Heartburn in pregnancy    Incompetent cervix in pregnancy 04/2015   Missed abortion    no surgery required   Postpartum care following cesarean delivery (11/2) 10/12/2015   right breast ca 06/2021   UTI in pregnancy 03/2015   recent UTI, treatment completed   Past Surgical History:  Procedure Laterality Date   APPENDECTOMY  2006   BREAST RECONSTRUCTION WITH PLACEMENT OF TISSUE EXPANDER AND ALLODERM Right 07/25/2021   Procedure: RIGHT BREAST RECONSTRUCTION WITH PLACEMENT OF TISSUE EXPANDER AND ALLODERM;  Surgeon: Irene Limbo, MD;  Location: [place];  Service: Plastics;  Laterality: Right;   CERCLAGE LAPAROSCOPIC ABDOMINAL N/A 04/15/2015   Procedure: LAPAROSCOPIC TRANSABDOMINAL CERVCOISTHMIC CERCLAGE;  Surgeon: Governor Specking, MD;  Location: [facility];  Service: Gynecology;  Laterality: N/A;   CERVICAL CERCLAGE  02/2011   vaginal   CESAREAN SECTION N/A 10/12/2015   Procedure: CESAREAN SECTION;  Surgeon: Princess Bruins, MD;  Location: [facility];  Service: Obstetrics;  Laterality: N/A;   CESAREAN SECTION N/A 03/03/2018   Procedure: Repeat CESAREAN SECTION/Removal of Abdominal Cerclage;  Surgeon: Azucena Fallen, MD;  Location: [place];  Service: Obstetrics;   Laterality: N/A;  EDD: 03/28/18   MASTECTOMY W/ SENTINEL NODE BIOPSY Right 07/25/2021   Procedure: RIGHT MASTECTOMY WITH AXILLARY SENTINEL LYMPH NODE BIOPSY;  Surgeon: [name], MD;  Location: [facility];  Service: General;  Laterality: Right;   PORTACATH PLACEMENT N/A 08/24/2021   Procedure: INSERTION PORT-A-CATH;  Surgeon: [name], MD;  Location: [facility];  Service: General;  Laterality: N/A;   RADIOACTIVE SEED GUIDED AXILLARY SENTINEL LYMPH NODE Right 07/25/2021   Procedure: RADIOACTIVE SEED GUIDED RIGHT AXILLARY NODE EXCISION;  Surgeon: [name], MD;  Location: [place];  Service: General;  Laterality: Right;   TISSUE EXPANDER PLACEMENT Right 12/04/2021   Procedure: REMOVAL RIGHT CHEST TISSUE EXPANDER;  Surgeon: Irene Limbo, MD;  Location: [place];  Service: Plastics;  Laterality: Right;   WISDOM TOOTH EXTRACTION  2001   Patient Active Problem List   Diagnosis Date Noted   Lower leg mass, left 05/02/2022   Port-A-Cath in place 08/25/2021   S/P mastectomy, right 07/25/2021   Genetic testing 07/01/2021   Family history of skin cancer 06/22/2021   Family history of brain tumor 06/22/2021   Malignant neoplasm of overlapping sites of right breast in female, estrogen receptor positive ([place]) 06/21/2021   Status post repeat low transverse cesarean section 03/03/2018   DM (diabetes mellitus) in pregnancy 03/02/2018   Incompetent cervix in pregnancy 03/02/2018    PCP: [name] MD  REFERRING PROVIDER: [name], MD  REFERRING DIAG: Right Breast Cancer  THERAPY DIAG:  Malignant neoplasm of overlapping sites of right breast in female, estrogen receptor positive ([place])  Stiffness of right shoulder, not elsewhere classified  ONSET DATE: 06/02/2022  Rationale for Evaluation and Treatment Rehabilitation  SUBJECTIVE                                                                                                                                                                                            SUBJECTIVE STATEMENT:Pt is concerned that her cording has come back. She started noticing pulling/tightness in the back of her arm and has had a lot of achiness in her arm.   Pt flew to [place] 2 weeks ago and wore her compression sleeve. About a week after she returned she started to notice limitations in her ROM and the achiness. She has been wearing her compression sleeve and that helps while it is on. She wears her TG soft at night.  She leaves for [facility] on Sunday this week. She has done some stretching in the morning, and some dancing with stretches.    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 She noted a palpable right breast mass. This is followed by a mammogram with a mass with calcifications as really throughout the upper outer quadrant and in the central breast. This measures 9.4 x 8.7 x 6.7 cm. On ultrasound she had 3 separate masses at 10, 11, and 12:00. The sizes are 1.7, 2.1, and 2.2 cm. There is also a single abnormal axillary lymph node that has undergone biopsy. 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 She is s/p right breast mastectomy with SLNB 0/7 LN on August 16 andl also had tissue expanders in place.  She is s/p chemotherapy for 6 months every other week and had radiation following ; She had emergency sugery to remove expander 12/04/21; completed radiation 04/04/22     PAIN:  Are you having pain? No, tired ache NPRS scale: 4/10 Pain location: Right UE entire arm Pain orientation: Right and Upper  PAIN TYPE:  aching Pain description: constant unless she wears compression sleeve Aggravating factors: end of the day, Relieving factors: compression sleeve  PRECAUTIONS: Other: lymphedema risk  Right UE  WEIGHT BEARING RESTRICTIONS No  FALLS:  Has patient fallen in last 6 months? No  LIVING ENVIRONMENT: Lives with: lives with their familyhusband and 2 childres Lives in: House/apartment   OCCUPATION: Business Analyst full time  LEISURE: dancing, running  HAND DOMINANCE : right   PRIOR LEVEL OF FUNCTION: Independent  PATIENT GOALS Decrease achiness, improve ROM   OBJECTIVE  COGNITION:  Overall cognitive status: Within functional limits for tasks assessed   PALPATION: Cording noted posterior arm, not superficial, but can be palpated   OBSERVATIONS / OTHER ASSESSMENTS: no visible cording, right UE is visually larger but she is right handed. No pitting edema  SENSATION:  Light touch: Deficits lateral chest  POSTURE: rounded shoulders  UPPER EXTREMITY AROM/PROM:  A/PROM RIGHT   eval 06/08/2022  Shoulder extension 45  Shoulder flexion 145  Shoulder abduction 129  Shoulder internal rotation   Shoulder external rotation 95    (Blank rows = not tested)  A/PROM LEFT   eval  Shoulder extension 45  Shoulder flexion 155  Shoulder abduction 176  Shoulder internal rotation   Shoulder external rotation 95    (Blank rows = not tested)   CERVICAL AROM: All within normal limits:      UPPER EXTREMITY STRENGTH: WNL  LYMPHEDEMA ASSESSMENTS:   SURGERY TYPE/DATE: Right breast mastectomy with SLNB 07/25/2021 with reconstruction, Emergency removal of expander 12/04/2021  NUMBER OF LYMPH NODES REMOVED: 4/7  CHEMOTHERAPY: yes  RADIATION:yes  HORMONE TREATMENT: yes  INFECTIONS: yes  LYMPHEDEMA ASSESSMENTS:   LANDMARK RIGHT  eval  10 cm proximal to olecranon process 26.4  Olecranon process 25.2  10 cm proximal to ulnar styloid process 21.0  Just proximal to ulnar  styloid process 15.5  Across hand at thumb web space 19.0  At base of 2nd digit 5.8  (Blank rows = not tested)  LANDMARK LEFT  eval  10 cm proximal to olecranon process 24.8  Olecranon process 24.0  10 cm proximal to ulnar styloid process 19.8  Just proximal to ulnar styloid process 15.3  Across hand at thumb web space 18.6  At base of 2nd digit 5.9  (Blank rows = not tested)      L-DEX LYMPHEDEMA SCREENING:  The patient was assessed using the L-Dex machine today to produce a lymphedema index baseline score. The patient will be reassessed on a regular basis (typically every 3 months) to obtain new L-Dex scores. If the score is > 6.5 points away from his/her baseline score indicating onset of subclinical lymphedema, it will be recommended to wear a compression garment for 4 weeks, 12 hours per day and then be reassessed. If the score continues to be > 6.5 points from baseline at reassessment, we will initiate lymphedema treatment. Assessing in this manner has a 95% rate of preventing clinically significant lymphedema.   L-DEX FLOWSHEETS - 06/08/22 1100       L-DEX LYMPHEDEMA SCREENING   Measurement Type Unilateral    L-DEX MEASUREMENT EXTREMITY Upper Extremity    POSITION  Standing    DOMINANT SIDE Right    At Risk Side Right    BASELINE SCORE (UNILATERAL) 0    L-DEX SCORE (UNILATERAL) 8.9    VALUE CHANGE (UNILAT) 8.9              QUICK DASH SURVEY: not completed   TODAY'S TREATMENT  Patient was educated in self MLD to the right UE with PT performing first and then pt demonstrating. She required VC's and TC's initially.  She had a good understanding of sequence, but had difficulty with lighter pressure. Improved by end of session. Performed PROM with MFR techniques to right shoulder flexion, scaption, D2 flexion with release techniques to cord in posterior upper arm  PATIENT EDUCATION:  Education details: MLD to right UE Person educated: Patient Education method:  Consulting civil engineer, Demonstration, and Handouts Education comprehension: verbalized understanding, returned demonstration, verbal cues required, tactile cues required, and needs further education   HOME EXERCISE PROGRAM: Advised pt to resume stretches atleast 2x's per day with clasped hands flexion, wall slides, stargazer and to perform MLD daily  ASSESSMENT:  CLINICAL IMPRESSION: Patient is a 46 y.o. female who was seen today for physical therapy evaluation and treatment s/p Right mastectomy on 07/25/2021 with immediate reconstruction and later removal of expander on 12/04/2021. Pt . Continues to have exacerbations of cording in the right UE which limits her AROM. A SOZO screen was also performed which puts her in the category of subclinical lymphedema at 8.9 points above baseline. She was instructed in self MLD to the right UE and given written instructions. Her arm felt much better after performing. PROM with MFR technqiues were also performed to decrease cording and increase ROM. She will benefit from skilled PT to address deficits and return to PLOF.   OBJECTIVE IMPAIRMENTS decreased activity tolerance, decreased ROM, increased edema, impaired flexibility, impaired UE functional use, postural dysfunction, and pain.   ACTIVITY LIMITATIONS    avoids heavier lifting, difficulty reaching to extremes on right  PARTICIPATION LIMITATIONS:  none just avoids doing certain things  PERSONAL FACTORS  Right breast CA s/p mastectomy, chemotherapy and radiation , diabetes are also affecting patient's functional outcome.   REHAB POTENTIAL: Excellent  CLINICAL DECISION MAKING: Stable/uncomplicated  EVALUATION COMPLEXITY: Low  GOALS: Goals reviewed with patient? Yes     SHORT TERM GOALS =LONG TERM GOALS: Target date: 07/20/2022    Pt will improve right shoulder flexion to WNL Baseline: see measurements at eval Goal status: INITIAL  2.  Pt will be independent with self MLD to prevent/treat  lymphedema Baseline:  Goal status: INITIAL  3.  Pt will report decreased achiness in right UE by 50% or greater Baseline: 4/10 constant Goal status: INITIAL  4.  Pts SOZO screen will return to normal range  Baseline: 0, last measured 8.9 Goal status: INITIAL   PLAN: PT FREQUENCY: 2x/week  PT DURATION: 6 weeks  PLANNED INTERVENTIONS: Therapeutic exercises, Therapeutic activity, Neuromuscular re-education, Patient/Family education, Joint mobilization, Orthotic/Fit training, Manual lymph drainage, Vasopneumatic device, and Manual therapy  PLAN FOR NEXT SESSION:AA/PROM right shoulder, MFR techniques to cording, review self right UE MLD, update HEP prn   Claris Pong, PT 06/08/2022, 11:11 AM

## 2022-06-08 ENCOUNTER — Other Ambulatory Visit: Payer: Self-pay

## 2022-06-08 ENCOUNTER — Ambulatory Visit: Payer: 59 | Attending: Plastic Surgery

## 2022-06-08 DIAGNOSIS — M25611 Stiffness of right shoulder, not elsewhere classified: Secondary | ICD-10-CM | POA: Diagnosis present

## 2022-06-08 DIAGNOSIS — Z170 Estrogen receptor positive status [ER+]: Secondary | ICD-10-CM | POA: Diagnosis present

## 2022-06-08 DIAGNOSIS — C50811 Malignant neoplasm of overlapping sites of right female breast: Secondary | ICD-10-CM | POA: Diagnosis present

## 2022-06-11 ENCOUNTER — Telehealth: Payer: Self-pay | Admitting: Adult Health

## 2022-06-11 NOTE — Telephone Encounter (Signed)
Rescheduled appointments per provider PAL. Left voicemail.

## 2022-06-18 ENCOUNTER — Inpatient Hospital Stay: Payer: 59 | Admitting: Adult Health

## 2022-06-18 ENCOUNTER — Inpatient Hospital Stay: Payer: 59 | Admitting: Hematology and Oncology

## 2022-06-18 ENCOUNTER — Inpatient Hospital Stay: Payer: 59 | Attending: Hematology and Oncology

## 2022-06-18 VITALS — BP 103/82 | HR 84 | Temp 98.0°F | Resp 18

## 2022-06-18 DIAGNOSIS — Z5112 Encounter for antineoplastic immunotherapy: Secondary | ICD-10-CM | POA: Insufficient documentation

## 2022-06-18 DIAGNOSIS — Z170 Estrogen receptor positive status [ER+]: Secondary | ICD-10-CM | POA: Diagnosis not present

## 2022-06-18 DIAGNOSIS — C50811 Malignant neoplasm of overlapping sites of right female breast: Secondary | ICD-10-CM | POA: Diagnosis present

## 2022-06-18 MED ORDER — GOSERELIN ACETATE 3.6 MG ~~LOC~~ IMPL
3.6000 mg | DRUG_IMPLANT | Freq: Once | SUBCUTANEOUS | Status: AC
  Administered 2022-06-18: 3.6 mg via SUBCUTANEOUS
  Filled 2022-06-18: qty 3.6

## 2022-06-18 NOTE — Patient Instructions (Signed)
Goserelin injection What is this medication? GOSERELIN (GOE se rel in) is similar to a hormone found in the body. It lowers the amount of sex hormones that the body makes. Men will have lower testosterone levels and women will have lower estrogen levels while taking this medicine. In men, this medicine is used to treat prostate cancer; the injection is either given once per month or once every 12 weeks. A once per month injection (only) is used to treat women with endometriosis, dysfunctional uterine bleeding, or advanced breast cancer. This medicine may be used for other purposes; ask your health care provider or pharmacist if you have questions. COMMON BRAND NAME(S): Zoladex, Zoladex 37-Month What should I tell my care team before I take this medication? They need to know if you have any of these conditions: bone problems diabetes heart disease history of irregular heartbeat an unusual or allergic reaction to goserelin, other medicines, foods, dyes, or preservatives pregnant or trying to get pregnant breast-feeding How should I use this medication? This medicine is for injection under the skin. It is given by a health care professional in a hospital or clinic setting. Talk to your pediatrician regarding the use of this medicine in children. Special care may be needed. Overdosage: If you think you have taken too much of this medicine contact a poison control center or emergency room at once. NOTE: This medicine is only for you. Do not share this medicine with others. What if I miss a dose? It is important not to miss your dose. Call your doctor or health care professional if you are unable to keep an appointment. What may interact with this medication? Do not take this medicine with any of the following medications: cisapride dronedarone pimozide thioridazine This medicine may also interact with the following medications: other medicines that prolong the QT interval (an abnormal heart  rhythm) This list may not describe all possible interactions. Give your health care provider a list of all the medicines, herbs, non-prescription drugs, or dietary supplements you use. Also tell them if you smoke, drink alcohol, or use illegal drugs. Some items may interact with your medicine. What should I watch for while using this medication? Visit your doctor or health care provider for regular checks on your progress. Your symptoms may appear to get worse during the first weeks of this therapy. Tell your doctor or healthcare provider if your symptoms do not start to get better or if they get worse after this time. Your bones may get weaker if you take this medicine for a long time. If you smoke or frequently drink alcohol you may increase your risk of bone loss. A family history of osteoporosis, chronic use of drugs for seizures (convulsions), or corticosteroids can also increase your risk of bone loss. Talk to your doctor about how to keep your bones strong. This medicine should stop regular monthly menstruation in women. Tell your doctor if you continue to menstruate. Women should not become pregnant while taking this medicine or for 12 weeks after stopping this medicine. Women should inform their doctor if they wish to become pregnant or think they might be pregnant. There is a potential for serious side effects to an unborn child. Talk to your health care professional or pharmacist for more information. Do not breast-feed an infant while taking this medicine. Men should inform their doctors if they wish to father a child. This medicine may lower sperm counts. Talk to your health care professional or pharmacist for more information. This  medicine may increase blood sugar. Ask your healthcare provider if changes in diet or medicines are needed if you have diabetes. What side effects may I notice from receiving this medication? Side effects that you should report to your doctor or health care  professional as soon as possible: allergic reactions like skin rash, itching or hives, swelling of the face, lips, or tongue bone pain breathing problems changes in vision chest pain feeling faint or lightheaded, falls fever, chills pain, swelling, warmth in the leg pain, tingling, numbness in the hands or feet signs and symptoms of high blood sugar such as being more thirsty or hungry or having to urinate more than normal. You may also feel very tired or have blurry vision signs and symptoms of low blood pressure like dizziness; feeling faint or lightheaded, falls; unusually weak or tired stomach pain swelling of the ankles, feet, hands trouble passing urine or change in the amount of urine unusually high or low blood pressure unusually weak or tired Side effects that usually do not require medical attention (report to your doctor or health care professional if they continue or are bothersome): change in sex drive or performance changes in breast size in both males and females changes in emotions or moods headache hot flashes irritation at site where injected loss of appetite skin problems like acne, dry skin vaginal dryness This list may not describe all possible side effects. Call your doctor for medical advice about side effects. You may report side effects to FDA at 1-800-FDA-1088. Where should I keep my medication? This drug is given in a hospital or clinic and will not be stored at home. NOTE: This sheet is a summary. It may not cover all possible information. If you have questions about this medicine, talk to your doctor, pharmacist, or health care provider.  2023 Elsevier/Gold Standard (2019-03-27 00:00:00)

## 2022-06-27 ENCOUNTER — Ambulatory Visit: Payer: 59 | Attending: Plastic Surgery

## 2022-06-27 DIAGNOSIS — R293 Abnormal posture: Secondary | ICD-10-CM | POA: Insufficient documentation

## 2022-06-27 DIAGNOSIS — M25611 Stiffness of right shoulder, not elsewhere classified: Secondary | ICD-10-CM | POA: Insufficient documentation

## 2022-06-27 DIAGNOSIS — C50811 Malignant neoplasm of overlapping sites of right female breast: Secondary | ICD-10-CM | POA: Diagnosis not present

## 2022-06-27 DIAGNOSIS — Z170 Estrogen receptor positive status [ER+]: Secondary | ICD-10-CM | POA: Insufficient documentation

## 2022-06-27 NOTE — Therapy (Signed)
OUTPATIENT PHYSICAL THERAPY TREATMENT NOTE   Patient Name: Allison Reed MRN: [id number] DOB:January 15, 1976, 46 y.o., female Today's Date: 06/27/2022  PCP: [name] MD REFERRING PROVIDER: [name], MD  END OF SESSION:   PT End of Session - 06/27/22 1004     Visit Number 2    Number of Visits 12    Date for PT Re-Evaluation 07/20/22    PT Start Time 1000    PT Stop Time 1040   pt had to leave at this time   PT Time Calculation (min) 40 min    Activity Tolerance Patient tolerated treatment well    Behavior During Therapy [facility] for tasks assessed/performed             Past Medical History:  Diagnosis Date   Diabetes mellitus without complication ([place])    Type 2   Family history of brain tumor    Family history of skin cancer    Gestational diabetes    Headache    otc med prn   Heartburn in pregnancy    Incompetent cervix in pregnancy 04/2015   Missed abortion    no surgery required   Postpartum care following cesarean delivery (11/2) 10/12/2015   right breast ca 06/2021   UTI in pregnancy 03/2015   recent UTI, treatment completed   Past Surgical History:  Procedure Laterality Date   APPENDECTOMY  2006   BREAST RECONSTRUCTION WITH PLACEMENT OF TISSUE EXPANDER AND ALLODERM Right 07/25/2021   Procedure: RIGHT BREAST RECONSTRUCTION WITH PLACEMENT OF TISSUE EXPANDER AND ALLODERM;  Surgeon: Irene Limbo, MD;  Location: [place];  Service: Plastics;  Laterality: Right;   CERCLAGE LAPAROSCOPIC ABDOMINAL N/A 04/15/2015   Procedure: LAPAROSCOPIC TRANSABDOMINAL CERVCOISTHMIC CERCLAGE;  Surgeon: Governor Specking, MD;  Location: [place] [facility];  Service: Gynecology;  Laterality: N/A;   CERVICAL CERCLAGE  02/2011   vaginal   CESAREAN SECTION N/A 10/12/2015   Procedure: CESAREAN SECTION;  Surgeon: Princess Bruins, MD;  Location: [place] [place] [facility];  Service: Obstetrics;  Laterality: N/A;   CESAREAN SECTION N/A 03/03/2018   Procedure: Repeat CESAREAN SECTION/Removal of  Abdominal Cerclage;  Surgeon: Azucena Fallen, MD;  Location: [place];  Service: Obstetrics;  Laterality: N/A;  EDD: 03/28/18   MASTECTOMY W/ SENTINEL NODE BIOPSY Right 07/25/2021   Procedure: RIGHT MASTECTOMY WITH AXILLARY SENTINEL LYMPH NODE BIOPSY;  Surgeon: [name], MD;  Location: [place];  Service: General;  Laterality: Right;   PORTACATH PLACEMENT N/A 08/24/2021   Procedure: INSERTION PORT-A-CATH;  Surgeon: [name], MD;  Location: [place];  Service: General;  Laterality: N/A;   RADIOACTIVE SEED GUIDED AXILLARY SENTINEL LYMPH NODE Right 07/25/2021   Procedure: RADIOACTIVE SEED GUIDED RIGHT AXILLARY NODE EXCISION;  Surgeon: [name], MD;  Location: [place];  Service: General;  Laterality: Right;   TISSUE EXPANDER PLACEMENT Right 12/04/2021   Procedure: REMOVAL RIGHT CHEST TISSUE EXPANDER;  Surgeon: Irene Limbo, MD;  Location: [place];  Service: Plastics;  Laterality: Right;   WISDOM TOOTH EXTRACTION  2001   Patient Active Problem List   Diagnosis Date Noted   Lower leg mass, left 05/02/2022   Port-A-Cath in place 08/25/2021   S/P mastectomy, right 07/25/2021   Genetic testing 07/01/2021   Family history of skin cancer 06/22/2021   Family history of brain tumor 06/22/2021   Malignant neoplasm of overlapping sites of right breast in female, estrogen receptor positive ([place]) 06/21/2021   Status post repeat low transverse cesarean section 03/03/2018  DM (diabetes mellitus) in pregnancy 03/02/2018   Incompetent cervix in pregnancy 03/02/2018    REFERRING DIAG: Right Breast Cancer  THERAPY DIAG:  Malignant neoplasm of overlapping sites of right breast in female, estrogen receptor positive ([place])  Stiffness of right shoulder, not elsewhere classified  Abnormal posture  Rationale for Evaluation and Treatment Rehabilitation  PERTINENT HISTORY: 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 She noted a palpable right breast mass. This is followed by a mammogram with a mass with calcifications as really throughout the upper outer quadrant and in the central breast. This measures 9.4 x 8.7 x 6.7 cm. On ultrasound she had 3 separate masses at 10, 11, and 12:00. The sizes are 1.7, 2.1, and 2.2 cm. There is also a single abnormal axillary lymph node that has undergone biopsy. 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 She is s/p right breast mastectomy with SLNB 0/7 LN on August 16 andl also had tissue expanders in place.  She is s/p chemotherapy for 6 months every other week and had radiation following ; She had emergency sugery to remove expander 12/04/21; completed radiation 04/04/22   PRECAUTIONS: Other: lymphedema risk Right UE  SUBJECTIVE: I need to leave at 1040 today. I've been wearing my compression sleeve religiously for 8-10 hrs/day. The self MLD I haven't been doing as much. I've tried it a few times since I was here last but probably not doing it as well as I should. Also, I'm still fighting with the insurance company to get my compression sleeves covered and they said they need notes from you guys. I can get you the fax number and you can send that to them.   PAIN:  Are you having pain? No Just reports achiness at posterior Rt upper arm   OBJECTIVE: (objective measures completed at initial evaluation unless otherwise dated)   PALPATION: Cording noted posterior arm, not superficial, but can be palpated    OBSERVATIONS / OTHER ASSESSMENTS: no visible cording, right UE is visually larger but she is right handed. No pitting edema   SENSATION:             Light touch: Deficits lateral chest            POSTURE: rounded shoulders   UPPER EXTREMITY AROM/PROM:   A/PROM RIGHT   eval 06/08/2022  Shoulder extension 45  Shoulder flexion 145  Shoulder abduction 129  Shoulder internal rotation    Shoulder external rotation 95                          (Blank rows = not tested)   A/PROM LEFT   eval  Shoulder extension 45  Shoulder flexion 155  Shoulder abduction 176  Shoulder internal rotation    Shoulder external rotation 95                          (Blank rows = not tested)     CERVICAL AROM: All within normal limits:          UPPER EXTREMITY STRENGTH: WNL   LYMPHEDEMA ASSESSMENTS:  SURGERY TYPE/DATE: Right breast mastectomy with SLNB 07/25/2021 with reconstruction, Emergency removal of expander 12/04/2021   NUMBER OF LYMPH NODES REMOVED: 4/7   CHEMOTHERAPY: yes   RADIATION:yes   HORMONE TREATMENT: yes   INFECTIONS: yes   LYMPHEDEMA ASSESSMENTS:    LANDMARK RIGHT  eval  10 cm proximal to olecranon process 26.4  Olecranon process 25.2  10 cm proximal to ulnar styloid process 21.0  Just proximal to ulnar styloid process 15.5  Across hand at thumb web space 19.0  At base of 2nd digit 5.8  (Blank rows = not tested)   LANDMARK LEFT  eval  10 cm proximal to olecranon process 24.8  Olecranon process 24.0  10 cm proximal to ulnar styloid process 19.8  Just proximal to ulnar styloid process 15.3  Across hand at thumb web space 18.6  At base of 2nd digit 5.9  (Blank rows = not tested)           L-DEX LYMPHEDEMA SCREENING:   The patient was assessed using the L-Dex machine today to produce a lymphedema index baseline score. The patient will be reassessed on a regular basis (typically every 3 months) to obtain new L-Dex scores. If the score is > 6.5 points away from his/her baseline score indicating onset of subclinical lymphedema, it will be recommended to wear a compression garment for 4 weeks, 12 hours per day  and then be reassessed. If the score continues to be > 6.5 points from baseline at reassessment, we will initiate lymphedema treatment. Assessing in this manner has a 95% rate of preventing clinically significant lymphedema.     L-DEX FLOWSHEETS - 06/08/22 1100                L-DEX LYMPHEDEMA SCREENING    Measurement Type Unilateral     L-DEX MEASUREMENT EXTREMITY Upper Extremity     POSITION  Standing     DOMINANT SIDE Right     At Risk Side Right     BASELINE SCORE (UNILATERAL) 0     L-DEX SCORE (UNILATERAL) 8.9     VALUE CHANGE (UNILAT) 8.9                     QUICK DASH SURVEY: not completed     TODAY'S TREATMENT  7/19/023: Manual Therapy MLD: In Supine to Rt UE as follows: Short neck, 5 diaphragmatic breaths, Lt axillary and Rt inguinal nodes, anterior inter-axillary and Rt axillo-inguinal anastomosis, then focused on Rt UE working from proximal to distal then retracing all steps (lateral upper arm, medial to latera, then lateral again, cubital fossa, ant/post forearm and dorsum of hand and phalanges then retraced same steps) finishing with anastomosis and stationary circles over node beds. Reviewed importance of all steps of sequence while performing in answer to pts questions.  MFR with end P/ROM of Rt shoulder to Rt axilla over area of palpable cording.   06/08/22: Patient was educated in self MLD to the right UE with PT performing first and then pt demonstrating. She required VC's and TC's initially. She had a good understanding of sequence, but had difficulty with lighter pressure. Improved by end of session. Performed PROM with MFR techniques to right shoulder flexion, scaption, D2 flexion with release techniques to cord in posterior upper arm   PATIENT EDUCATION:  Education details: MLD to right UE Person educated: Patient Education method: Explanation, Demonstration, and Handouts Education comprehension: verbalized understanding, returned demonstration, verbal cues  required, tactile cues required, and needs further  education     HOME EXERCISE PROGRAM: Advised pt to resume stretches atleast 2x's per day with clasped hands flexion, wall slides, stargazer and to perform MLD daily   ASSESSMENT:   CLINICAL IMPRESSION: Pt returns reporting she has been wearing her compression sleeve every day for 8-10 hrs/day but hasn't been as consistent with sequence of self MLD. Reviewed sequence and importance of each step with pt while performing today. Also continued with MFR at Rt axilla at end P/ROMs of Rt shoulder. She was able to report feeling very good stretches here as her cording has returned. Some softening noted here by end of session of the palpable tightness. Pt also requested physical therapy note to be sent to her insurance company for coverage of her compression sleeves. She will email fax number to this therapist and then eval will be sent to her insurance at her request.      OBJECTIVE IMPAIRMENTS decreased activity tolerance, decreased ROM, increased edema, impaired flexibility, impaired UE functional use, postural dysfunction, and pain.    ACTIVITY LIMITATIONS    avoids heavier lifting, difficulty reaching to extremes on right   PARTICIPATION LIMITATIONS:  none just avoids doing certain things   PERSONAL FACTORS  Right breast CA s/p mastectomy, chemotherapy and radiation , diabetes are also affecting patient's functional outcome.    REHAB POTENTIAL: Excellent   CLINICAL DECISION MAKING: Stable/uncomplicated   EVALUATION COMPLEXITY: Low   GOALS: Goals reviewed with patient? Yes         SHORT TERM GOALS =LONG TERM GOALS: Target date: 07/20/2022     Pt will improve right shoulder flexion to WNL Baseline: see measurements at eval Goal status: INITIAL   2.  Pt will be independent with self MLD to prevent/treat lymphedema Baseline:  Goal status: INITIAL   3.  Pt will report decreased achiness in right UE by 50% or greater Baseline: 4/10  constant Goal status: INITIAL   4.  Pts SOZO screen will return to normal range  Baseline: 0, last measured 8.9 Goal status: INITIAL     PLAN: PT FREQUENCY: 2x/week   PT DURATION: 6 weeks   PLANNED INTERVENTIONS: Therapeutic exercises, Therapeutic activity, Neuromuscular re-education, Patient/Family education, Joint mobilization, Orthotic/Fit training, Manual lymph drainage, Vasopneumatic device, and Manual therapy   PLAN FOR NEXT SESSION: AA/PROM right shoulder, MFR techniques to cording, review self right UE MLD, update HEP prn  Collie Siad, PTA 06/27/22 10:47 AM

## 2022-06-29 ENCOUNTER — Encounter: Payer: Self-pay | Admitting: Rehabilitation

## 2022-07-03 ENCOUNTER — Encounter: Payer: Self-pay | Admitting: Physical Therapy

## 2022-07-03 ENCOUNTER — Ambulatory Visit: Payer: 59 | Admitting: Physical Therapy

## 2022-07-03 DIAGNOSIS — C50811 Malignant neoplasm of overlapping sites of right female breast: Secondary | ICD-10-CM | POA: Diagnosis not present

## 2022-07-03 DIAGNOSIS — R293 Abnormal posture: Secondary | ICD-10-CM

## 2022-07-03 DIAGNOSIS — Z170 Estrogen receptor positive status [ER+]: Secondary | ICD-10-CM

## 2022-07-03 DIAGNOSIS — M25611 Stiffness of right shoulder, not elsewhere classified: Secondary | ICD-10-CM

## 2022-07-03 NOTE — Therapy (Signed)
OUTPATIENT PHYSICAL THERAPY TREATMENT NOTE   Patient Name: Allison Reed MRN: [id number] DOB:[birth year], 46 y.o., female Today's Date: 07/03/2022  PCP: [name] MD REFERRING PROVIDER: [name], MD  END OF SESSION:   PT End of Session - 07/03/22 1150     Visit Number 3    Number of Visits 12    Date for PT Re-Evaluation 07/20/22    PT Start Time 1105    PT Stop Time 1151    PT Time Calculation (min) 46 min    Activity Tolerance Patient tolerated treatment well    Behavior During Therapy [facility] for tasks assessed/performed             Past Medical History:  Diagnosis Date   Diabetes mellitus without complication ([place])    Type 2   Family history of brain tumor    Family history of skin cancer    Gestational diabetes    Headache    otc med prn   Heartburn in pregnancy    Incompetent cervix in pregnancy 04/2015   Missed abortion    no surgery required   Postpartum care following cesarean delivery (11/2) 10/12/2015   right breast ca 06/2021   UTI in pregnancy 03/2015   recent UTI, treatment completed   Past Surgical History:  Procedure Laterality Date   APPENDECTOMY  2006   BREAST RECONSTRUCTION WITH PLACEMENT OF TISSUE EXPANDER AND ALLODERM Right 07/25/2021   Procedure: RIGHT BREAST RECONSTRUCTION WITH PLACEMENT OF TISSUE EXPANDER AND ALLODERM;  Surgeon: Irene Limbo, MD;  Location: [place];  Service: Plastics;  Laterality: Right;   CERCLAGE LAPAROSCOPIC ABDOMINAL N/A 04/15/2015   Procedure: LAPAROSCOPIC TRANSABDOMINAL CERVCOISTHMIC CERCLAGE;  Surgeon: Governor Specking, MD;  Location: [place] [facility];  Service: Gynecology;  Laterality: N/A;   CERVICAL CERCLAGE  02/2011   vaginal   CESAREAN SECTION N/A 10/12/2015   Procedure: CESAREAN SECTION;  Surgeon: Princess Bruins, MD;  Location: [place] [facility];  Service: Obstetrics;  Laterality: N/A;   CESAREAN SECTION N/A 03/03/2018   Procedure: Repeat CESAREAN SECTION/Removal of Abdominal Cerclage;  Surgeon: Azucena Fallen, MD;  Location: [place];  Service: Obstetrics;  Laterality: N/A;  EDD: 03/28/18   MASTECTOMY W/ SENTINEL NODE BIOPSY Right 07/25/2021   Procedure: RIGHT MASTECTOMY WITH AXILLARY SENTINEL LYMPH NODE BIOPSY;  Surgeon: [name], MD;  Location: [place];  Service: General;  Laterality: Right;   PORTACATH PLACEMENT N/A 08/24/2021   Procedure: INSERTION PORT-A-CATH;  Surgeon: [name], MD;  Location: [place];  Service: General;  Laterality: N/A;   RADIOACTIVE SEED GUIDED AXILLARY SENTINEL LYMPH NODE Right 07/25/2021   Procedure: RADIOACTIVE SEED GUIDED RIGHT AXILLARY NODE EXCISION;  Surgeon: [name], MD;  Location: [place];  Service: General;  Laterality: Right;   TISSUE EXPANDER PLACEMENT Right 12/04/2021   Procedure: REMOVAL RIGHT CHEST TISSUE EXPANDER;  Surgeon: Irene Limbo, MD;  Location: [place];  Service: Plastics;  Laterality: Right;   WISDOM TOOTH EXTRACTION  2001   Patient Active Problem List   Diagnosis Date Noted   Lower leg mass, left 05/02/2022   Port-A-Cath in place 08/25/2021   S/P mastectomy, right 07/25/2021   Genetic testing 07/01/2021   Family history of skin cancer 06/22/2021   Family history of brain tumor 06/22/2021   Malignant neoplasm of overlapping sites of right breast in female, estrogen receptor positive ([place]) 06/21/2021   Status post repeat low transverse cesarean section 03/03/2018   DM (diabetes mellitus) in pregnancy 03/02/2018  Incompetent cervix in pregnancy 03/02/2018    REFERRING DIAG: Right Breast Cancer  THERAPY DIAG:  Malignant neoplasm of overlapping sites of right breast in female, estrogen receptor positive ([place])  Stiffness of right shoulder, not elsewhere classified  Abnormal posture  Rationale for Evaluation and Treatment Rehabilitation  PERTINENT HISTORY: 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 She noted a palpable right  breast mass. This is followed by a mammogram with a mass with calcifications as really throughout the upper outer quadrant and in the central breast. This measures 9.4 x 8.7 x 6.7 cm. On ultrasound she had 3 separate masses at 10, 11, and 12:00. The sizes are 1.7, 2.1, and 2.2 cm. There is also a single abnormal axillary lymph node that has undergone biopsy. 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 She is s/p right breast mastectomy with SLNB 0/7 LN on August 16 andl also had tissue expanders in place.  She is s/p chemotherapy for 6 months every other week and had radiation following ; She had emergency sugery to remove expander 12/04/21; completed radiation 04/04/22   PRECAUTIONS: Other: lymphedema risk Right UE  SUBJECTIVE: Pt says that she feels "tight" She has started her classic [place] dancing and has a hard time with a move where she has to bring her right arm behind her  PAIN:  Are you having pain? No Just reports tightness  at posterior Rt upper arm   OBJECTIVE: (objective measures completed at initial evaluation unless otherwise dated)   PALPATION: Tightness in right axilla and posterior shoulder along scapular muscles    OBSERVATIONS / OTHER ASSESSMENTS: no visible cording, right UE is visually larger but she is right handed. No pitting edema   SENSATION:            Light touch: Deficits lateral chest            POSTURE: rounded shoulders   UPPER EXTREMITY AROM/PROM:   A/PROM RIGHT   eval 06/08/2022  Shoulder extension 45  Shoulder flexion 145  Shoulder abduction 129  Shoulder internal rotation    Shoulder external rotation 95                          (Blank  rows = not tested)   A/PROM LEFT   eval  Shoulder extension 45  Shoulder flexion 155  Shoulder abduction 176  Shoulder internal rotation    Shoulder external rotation 95                          (Blank rows = not tested)     CERVICAL AROM: All within normal limits:          UPPER EXTREMITY STRENGTH: WNL   LYMPHEDEMA ASSESSMENTS:    SURGERY TYPE/DATE: Right breast mastectomy with SLNB 07/25/2021 with reconstruction, Emergency removal of expander 12/04/2021   NUMBER OF LYMPH NODES REMOVED: 4/7   CHEMOTHERAPY: yes   RADIATION:yes   HORMONE TREATMENT: yes   INFECTIONS: yes   LYMPHEDEMA ASSESSMENTS:    LANDMARK RIGHT  eval  10 cm proximal to olecranon process 26.4  Olecranon  process 25.2  10 cm proximal to ulnar styloid process 21.0  Just proximal to ulnar styloid process 15.5  Across hand at thumb web space 19.0  At base of 2nd digit 5.8  (Blank rows = not tested)   LANDMARK LEFT  eval  10 cm proximal to olecranon process 24.8  Olecranon process 24.0  10 cm proximal to ulnar styloid process 19.8  Just proximal to ulnar styloid process 15.3  Across hand at thumb web space 18.6  At base of 2nd digit 5.9  (Blank rows = not tested)           L-DEX LYMPHEDEMA SCREENING:   The patient was assessed using the L-Dex machine today to produce a lymphedema index baseline score. The patient will be reassessed on a regular basis (typically every 3 months) to obtain new L-Dex scores. If the score is > 6.5 points away from his/her baseline score indicating onset of subclinical lymphedema, it will be recommended to wear a compression garment for 4 weeks, 12 hours per day and then be reassessed. If the score continues to be > 6.5 points from baseline at reassessment, we will initiate lymphedema treatment. Assessing in this manner has a 95% rate of preventing clinically significant lymphedema.     L-DEX FLOWSHEETS - 06/08/22 1100                L-DEX LYMPHEDEMA SCREENING     Measurement Type Unilateral     L-DEX MEASUREMENT EXTREMITY Upper Extremity     POSITION  Standing     DOMINANT SIDE Right     At Risk Side Right     BASELINE SCORE (UNILATERAL) 0     L-DEX SCORE (UNILATERAL) 8.9     VALUE CHANGE (UNILAT) 8.9                     QUICK DASH SURVEY: not completed     TODAY'S TREATMENT:   07/03/2022: Manual Therapy and therapeutic exercise   with coco butter in supine and sidelying, soft tissue work with stroking, prolonged pressure on trigger points and myofascial release to tight muscles especially around the scapula.  Instructed in deep breathing into right side body in side lying for better active rib excursion,  upper and lower trunk rotation stretches in sidelying and supine and trunk rotation and side bending in sitting.  7/19/023: Manual Therapy MLD: In Supine to Rt UE as follows: Short neck, 5 diaphragmatic breaths, Lt axillary and Rt inguinal nodes, anterior inter-axillary and Rt axillo-inguinal anastomosis, then focused on Rt UE working from proximal to distal then retracing all steps (lateral upper arm, medial to latera, then lateral again, cubital fossa, ant/post forearm and dorsum of hand and phalanges then retraced same steps) finishing with anastomosis and stationary circles over node beds. Reviewed importance of all steps of sequence while performing in answer to pts questions.  MFR with end P/ROM of Rt shoulder to Rt axilla over area of palpable cording.   06/08/22: Patient was educated in self MLD to the right UE with PT performing first and then pt demonstrating. She required VC's and TC's initially. She had a good understanding of sequence, but had difficulty with lighter pressure. Improved by end of session. Performed PROM with MFR techniques to right shoulder flexion, scaption, D2 flexion with release techniques to cord in posterior upper arm   PATIENT EDUCATION:  Education details: MLD to right UE Person educated:  Patient Education method: Explanation, Demonstration, and Handouts Education comprehension:  verbalized understanding, returned demonstration, verbal cues required, tactile cues required, and needs further education     HOME EXERCISE PROGRAM: Advised pt to resume stretches atleast 2x's per day with clasped hands flexion, wall slides, stargazer and to perform MLD daily   ASSESSMENT:   CLINICAL IMPRESSION: Pt with some relief from tightness with soft tissue work and will work on adding rib and scapular movement to her exercise program     OBJECTIVE IMPAIRMENTS decreased activity tolerance, decreased ROM, increased edema, impaired flexibility, impaired UE functional use, postural dysfunction, and pain.    ACTIVITY LIMITATIONS    avoids heavier lifting, difficulty reaching to extremes on right   PARTICIPATION LIMITATIONS:  none just avoids doing certain things   PERSONAL FACTORS  Right breast CA s/p mastectomy, chemotherapy and radiation , diabetes are also affecting patient's functional outcome.    REHAB POTENTIAL: Excellent   CLINICAL DECISION MAKING: Stable/uncomplicated   EVALUATION COMPLEXITY: Low   GOALS: Goals reviewed with patient? Yes         SHORT TERM GOALS =LONG TERM GOALS: Target date: 07/20/2022     Pt will improve right shoulder flexion to WNL Baseline: see measurements at eval Goal status: INITIAL   2.  Pt will be independent with self MLD to prevent/treat lymphedema Baseline:  Goal status: INITIAL   3.  Pt will report decreased achiness in right UE by 50% or greater Baseline: 4/10 constant Goal status: INITIAL   4.  Pts SOZO screen will return to normal range  Baseline: 0, last measured 8.9 Goal status: INITIAL     PLAN: PT FREQUENCY: 2x/week   PT DURATION: 6 weeks   PLANNED INTERVENTIONS: Therapeutic exercises, Therapeutic activity, Neuromuscular re-education, Patient/Family education, Joint mobilization, Orthotic/Fit training, Manual lymph  drainage, Vasopneumatic device, and Manual therapy   PLAN FOR NEXT SESSION: AA/PROM right shoulder, MFR techniques to cording, review self right UE MLD, update HEP prn Helene Kelp K. Owens Shark, PT   07/03/22 11:59 AM

## 2022-07-04 ENCOUNTER — Inpatient Hospital Stay: Payer: 59

## 2022-07-05 ENCOUNTER — Ambulatory Visit: Payer: 59 | Admitting: Rehabilitation

## 2022-07-05 ENCOUNTER — Encounter: Payer: 59 | Admitting: Adult Health

## 2022-07-09 ENCOUNTER — Ambulatory Visit: Payer: 59

## 2022-07-09 VITALS — Wt 154.5 lb

## 2022-07-09 DIAGNOSIS — R293 Abnormal posture: Secondary | ICD-10-CM

## 2022-07-09 DIAGNOSIS — Z170 Estrogen receptor positive status [ER+]: Secondary | ICD-10-CM

## 2022-07-09 DIAGNOSIS — M25611 Stiffness of right shoulder, not elsewhere classified: Secondary | ICD-10-CM

## 2022-07-09 DIAGNOSIS — C50811 Malignant neoplasm of overlapping sites of right female breast: Secondary | ICD-10-CM | POA: Diagnosis not present

## 2022-07-09 NOTE — Therapy (Signed)
OUTPATIENT PHYSICAL THERAPY TREATMENT NOTE   Patient Name: Allison Reed MRN: [id number] DOB:03-06-1976, 46 y.o., female Today's Date: 07/09/2022  PCP: [name] MD REFERRING PROVIDER: [name], MD  END OF SESSION:   PT End of Session - 07/09/22 1307     Visit Number 4    Number of Visits 12    Date for PT Re-Evaluation 07/20/22    PT Start Time 1301    PT Stop Time 1356    PT Time Calculation (min) 55 min    Activity Tolerance Patient tolerated treatment well    Behavior During Therapy [facility] for tasks assessed/performed             Past Medical History:  Diagnosis Date   Diabetes mellitus without complication ([place])    Type 2   Family history of brain tumor    Family history of skin cancer    Gestational diabetes    Headache    otc med prn   Heartburn in pregnancy    Incompetent cervix in pregnancy 04/2015   Missed abortion    no surgery required   Postpartum care following cesarean delivery (11/2) 10/12/2015   right breast ca 06/2021   UTI in pregnancy 03/2015   recent UTI, treatment completed   Past Surgical History:  Procedure Laterality Date   APPENDECTOMY  2006   BREAST RECONSTRUCTION WITH PLACEMENT OF TISSUE EXPANDER AND ALLODERM Right 07/25/2021   Procedure: RIGHT BREAST RECONSTRUCTION WITH PLACEMENT OF TISSUE EXPANDER AND ALLODERM;  Surgeon: Irene Limbo, MD;  Location: [place];  Service: Plastics;  Laterality: Right;   CERCLAGE LAPAROSCOPIC ABDOMINAL N/A 04/15/2015   Procedure: LAPAROSCOPIC TRANSABDOMINAL CERVCOISTHMIC CERCLAGE;  Surgeon: Governor Specking, MD;  Location: [place] [facility];  Service: Gynecology;  Laterality: N/A;   CERVICAL CERCLAGE  02/2011   vaginal   CESAREAN SECTION N/A 10/12/2015   Procedure: CESAREAN SECTION;  Surgeon: Princess Bruins, MD;  Location: [facility];  Service: Obstetrics;  Laterality: N/A;   CESAREAN SECTION N/A 03/03/2018   Procedure: Repeat CESAREAN SECTION/Removal of Abdominal Cerclage;  Surgeon: Azucena Fallen, MD;  Location: [place];  Service: Obstetrics;  Laterality: N/A;  EDD: 03/28/18   MASTECTOMY W/ SENTINEL NODE BIOPSY Right 07/25/2021   Procedure: RIGHT MASTECTOMY WITH AXILLARY SENTINEL LYMPH NODE BIOPSY;  Surgeon: [name], MD;  Location: [place];  Service: General;  Laterality: Right;   PORTACATH PLACEMENT N/A 08/24/2021   Procedure: INSERTION PORT-A-CATH;  Surgeon: [name], MD;  Location: [place];  Service: General;  Laterality: N/A;   RADIOACTIVE SEED GUIDED AXILLARY SENTINEL LYMPH NODE Right 07/25/2021   Procedure: RADIOACTIVE SEED GUIDED RIGHT AXILLARY NODE EXCISION;  Surgeon: [name], MD;  Location: [place];  Service: General;  Laterality: Right;   TISSUE EXPANDER PLACEMENT Right 12/04/2021   Procedure: REMOVAL RIGHT CHEST TISSUE EXPANDER;  Surgeon: Irene Limbo, MD;  Location: [place];  Service: Plastics;  Laterality: Right;   WISDOM TOOTH EXTRACTION  2001   Patient Active Problem List   Diagnosis Date Noted   Lower leg mass, left 05/02/2022   Port-A-Cath in place 08/25/2021   S/P mastectomy, right 07/25/2021   Genetic testing 07/01/2021   Family history of skin cancer 06/22/2021   Family history of brain tumor 06/22/2021   Malignant neoplasm of overlapping sites of right breast in female, estrogen receptor positive ([place]) 06/21/2021   Status post repeat low transverse cesarean section 03/03/2018   DM (diabetes mellitus) in pregnancy 03/02/2018  Incompetent cervix in pregnancy 03/02/2018    REFERRING DIAG: Right Breast Cancer  THERAPY DIAG:  Malignant neoplasm of overlapping sites of right breast in female, estrogen receptor positive ([place])  Stiffness of right shoulder, not elsewhere classified  Abnormal posture  Rationale for Evaluation and Treatment Rehabilitation  PERTINENT HISTORY: 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 She noted a palpable right  breast mass. This is followed by a mammogram with a mass with calcifications as really throughout the upper outer quadrant and in the central breast. This measures 9.4 x 8.7 x 6.7 cm. On ultrasound she had 3 separate masses at 10, 11, and 12:00. The sizes are 1.7, 2.1, and 2.2 cm. There is also a single abnormal axillary lymph node that has undergone biopsy. 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 She is s/p right breast mastectomy with SLNB 0/7 LN on August 16 andl also had tissue expanders in place.  She is s/p chemotherapy for 6 months every other week and had radiation following ; She had emergency sugery to remove expander 12/04/21; completed radiation 04/04/22   PRECAUTIONS: Other: lymphedema risk Right UE  SUBJECTIVE:  The tightness is a little better since I was here last but it's still tight.   PAIN:  Are you having pain? No Just reports tightness  at posterior Rt upper arm   OBJECTIVE: (objective measures completed at initial evaluation unless otherwise dated)   PALPATION: Tightness in right axilla and posterior shoulder along scapular muscles    OBSERVATIONS / OTHER ASSESSMENTS: no visible cording, right UE is visually larger but she is right handed. No pitting edema   SENSATION:            Light touch: Deficits lateral chest            POSTURE: rounded shoulders   UPPER EXTREMITY AROM/PROM:   A/PROM RIGHT   eval 06/08/2022  Shoulder extension 45  Shoulder flexion 145  Shoulder abduction 129  Shoulder internal rotation    Shoulder external rotation 95                          (Blank rows = not tested)   A/PROM LEFT   eval  Shoulder extension 45   Shoulder flexion 155  Shoulder abduction 176  Shoulder internal rotation    Shoulder external rotation 95                          (Blank rows = not tested)     CERVICAL AROM: All within normal limits:          UPPER EXTREMITY STRENGTH: WNL   LYMPHEDEMA ASSESSMENTS:    SURGERY TYPE/DATE: Right breast mastectomy with SLNB 07/25/2021 with reconstruction, Emergency removal of expander 12/04/2021   NUMBER OF LYMPH NODES REMOVED: 4/7   CHEMOTHERAPY: yes   RADIATION:yes   HORMONE TREATMENT: yes   INFECTIONS: yes   LYMPHEDEMA ASSESSMENTS:    LANDMARK RIGHT  eval  10 cm proximal to olecranon process 26.4  Olecranon process 25.2  10 cm proximal to ulnar styloid process 21.0  Just proximal  to ulnar styloid process 15.5  Across hand at thumb web space 19.0  At base of 2nd digit 5.8  (Blank rows = not tested)   LANDMARK LEFT  eval  10 cm proximal to olecranon process 24.8  Olecranon process 24.0  10 cm proximal to ulnar styloid process 19.8  Just proximal to ulnar styloid process 15.3  Across hand at thumb web space 18.6  At base of 2nd digit 5.9  (Blank rows = not tested)           L-DEX LYMPHEDEMA SCREENING:   The patient was assessed using the L-Dex machine today to produce a lymphedema index baseline score. The patient will be reassessed on a regular basis (typically every 3 months) to obtain new L-Dex scores. If the score is > 6.5 points away from his/her baseline score indicating onset of subclinical lymphedema, it will be recommended to wear a compression garment for 4 weeks, 12 hours per day and then be reassessed. If the score continues to be > 6.5 points from baseline at reassessment, we will initiate lymphedema treatment. Assessing in this manner has a 95% rate of preventing clinically significant lymphedema.     L-DEX FLOWSHEETS - 07/09/22 1300       L-DEX LYMPHEDEMA SCREENING   Measurement Type Unilateral    L-DEX MEASUREMENT EXTREMITY Upper  Extremity    POSITION  Standing    DOMINANT SIDE Right    At Risk Side Right    BASELINE SCORE (UNILATERAL) 0    L-DEX SCORE (UNILATERAL) 10.2   taken after manual therapy, will try beginning of session next   VALUE CHANGE (UNILAT) 10.2                QUICK DASH SURVEY: not completed     TODAY'S TREATMENT:   07/09/22: Manual Therapy STM: In supine and then Lt S/L with cocoa butter, soft tissue work with stroking, prolonged pressure on trigger points and myofascial release to tight muscles in posterior upper arm, lateral scapula and Rt upper trap.  SOZO redone, her change from baseline os still testing higher than 6.5 indicating subclinical lymphedema still present however this was done after manual therapy so will reassess this at beginning of session next to see if this makes a difference.   07/03/2022: Manual Therapy and therapeutic exercise   with coco butter in supine and sidelying, soft tissue work with stroking, prolonged pressure on trigger points and myofascial release to tight muscles especially around the scapula.  Instructed in deep breathing into right side body in side lying for better active rib excursion,  upper and lower trunk rotation stretches in sidelying and supine and trunk rotation and side bending in sitting.  7/19/023: Manual Therapy MLD: In Supine to Rt UE as follows: Short neck, 5 diaphragmatic breaths, Lt axillary and Rt inguinal nodes, anterior inter-axillary and Rt axillo-inguinal anastomosis, then focused on Rt UE working from proximal to distal then retracing all steps (lateral upper arm, medial to latera, then lateral again, cubital fossa, ant/post forearm and dorsum of hand and phalanges then retraced same steps) finishing with anastomosis and stationary circles over node beds. Reviewed importance of all steps of sequence while performing in answer to pts questions.  MFR with end P/ROM of Rt shoulder to Rt axilla over area of palpable cording.    06/08/22: Patient was educated in self MLD to the right UE with PT performing first and then pt demonstrating. She required VC's and TC's initially. She had a good  understanding of sequence, but had difficulty with lighter pressure. Improved by end of session. Performed PROM with MFR techniques to right shoulder flexion, scaption, D2 flexion with release techniques to cord in posterior upper arm   PATIENT EDUCATION:  Education details: MLD to right UE Person educated: Patient Education method: Consulting civil engineer, Demonstration, and Handouts Education comprehension: verbalized understanding, returned demonstration, verbal cues required, tactile cues required, and needs further education     HOME EXERCISE PROGRAM: Advised pt to resume stretches atleast 2x's per day with clasped hands flexion, wall slides, stargazer and to perform MLD daily   ASSESSMENT:   CLINICAL IMPRESSION: Pt reports feeling some lessening of tightness since last visit so continued with manual therapy focusing on STM to Rt upper quadrant. Also reassessed SOZO but will do this again at beginning of session next to see if there's a difference in doing this before manual therapy which can increase lymphatic fluid.    OBJECTIVE IMPAIRMENTS decreased activity tolerance, decreased ROM, increased edema, impaired flexibility, impaired UE functional use, postural dysfunction, and pain.    ACTIVITY LIMITATIONS    avoids heavier lifting, difficulty reaching to extremes on right   PARTICIPATION LIMITATIONS:  none just avoids doing certain things   PERSONAL FACTORS  Right breast CA s/p mastectomy, chemotherapy and radiation , diabetes are also affecting patient's functional outcome.    REHAB POTENTIAL: Excellent   CLINICAL DECISION MAKING: Stable/uncomplicated   EVALUATION COMPLEXITY: Low   GOALS: Goals reviewed with patient? Yes         SHORT TERM GOALS =LONG TERM GOALS: Target date: 07/20/2022     Pt will improve right  shoulder flexion to WNL Baseline: see measurements at eval Goal status: INITIAL   2.  Pt will be independent with self MLD to prevent/treat lymphedema Baseline:  Goal status: INITIAL   3.  Pt will report decreased achiness in right UE by 50% or greater Baseline: 4/10 constant Goal status: INITIAL   4.  Pts SOZO screen will return to normal range  Baseline: 0, last measured 8.9 Goal status: INITIAL     PLAN: PT FREQUENCY: 2x/week   PT DURATION: 6 weeks   PLANNED INTERVENTIONS: Therapeutic exercises, Therapeutic activity, Neuromuscular re-education, Patient/Family education, Joint mobilization, Orthotic/Fit training, Manual lymph drainage, Vasopneumatic device, and Manual therapy   PLAN FOR NEXT SESSION: AA/PROM right shoulder, MFR techniques to cording, review self right UE MLD, update HEP prn   Collie Siad, PTA 07/09/22 1:57 PM

## 2022-07-11 ENCOUNTER — Ambulatory Visit: Payer: 59 | Attending: Plastic Surgery

## 2022-07-11 DIAGNOSIS — C50811 Malignant neoplasm of overlapping sites of right female breast: Secondary | ICD-10-CM | POA: Insufficient documentation

## 2022-07-11 DIAGNOSIS — M25611 Stiffness of right shoulder, not elsewhere classified: Secondary | ICD-10-CM | POA: Insufficient documentation

## 2022-07-11 DIAGNOSIS — Z170 Estrogen receptor positive status [ER+]: Secondary | ICD-10-CM | POA: Insufficient documentation

## 2022-07-11 DIAGNOSIS — R293 Abnormal posture: Secondary | ICD-10-CM | POA: Diagnosis present

## 2022-07-11 NOTE — Therapy (Signed)
OUTPATIENT PHYSICAL THERAPY TREATMENT NOTE   Patient Name: Allison Reed MRN: [id number] DOB:May 12, 1976, 46 y.o., female Today's Date: 07/11/2022  PCP: [name] MD REFERRING PROVIDER: [name], MD  END OF SESSION:   PT End of Session - 07/11/22 0834     Visit Number 5    Number of Visits 12    Date for PT Re-Evaluation 07/20/22    PT Start Time 0829   pt got appt times mixed up   PT Stop Time 0908    PT Time Calculation (min) 39 min    Activity Tolerance Patient tolerated treatment well    Behavior During Therapy [facility] for tasks assessed/performed             Past Medical History:  Diagnosis Date   Diabetes mellitus without complication ([place])    Type 2   Family history of brain tumor    Family history of skin cancer    Gestational diabetes    Headache    otc med prn   Heartburn in pregnancy    Incompetent cervix in pregnancy 04/2015   Missed abortion    no surgery required   Postpartum care following cesarean delivery (11/2) 10/12/2015   right breast ca 06/2021   UTI in pregnancy 03/2015   recent UTI, treatment completed   Past Surgical History:  Procedure Laterality Date   APPENDECTOMY  2006   BREAST RECONSTRUCTION WITH PLACEMENT OF TISSUE EXPANDER AND ALLODERM Right 07/25/2021   Procedure: RIGHT BREAST RECONSTRUCTION WITH PLACEMENT OF TISSUE EXPANDER AND ALLODERM;  Surgeon: Irene Limbo, MD;  Location: [place];  Service: Plastics;  Laterality: Right;   CERCLAGE LAPAROSCOPIC ABDOMINAL N/A 04/15/2015   Procedure: LAPAROSCOPIC TRANSABDOMINAL CERVCOISTHMIC CERCLAGE;  Surgeon: Governor Specking, MD;  Location: [facility];  Service: Gynecology;  Laterality: N/A;   CERVICAL CERCLAGE  02/2011   vaginal   CESAREAN SECTION N/A 10/12/2015   Procedure: CESAREAN SECTION;  Surgeon: Princess Bruins, MD;  Location: [facility];  Service: Obstetrics;  Laterality: N/A;   CESAREAN SECTION N/A 03/03/2018   Procedure: Repeat CESAREAN SECTION/Removal of  Abdominal Cerclage;  Surgeon: Azucena Fallen, MD;  Location: [place];  Service: Obstetrics;  Laterality: N/A;  EDD: 03/28/18   MASTECTOMY W/ SENTINEL NODE BIOPSY Right 07/25/2021   Procedure: RIGHT MASTECTOMY WITH AXILLARY SENTINEL LYMPH NODE BIOPSY;  Surgeon: [name], MD;  Location: [place];  Service: General;  Laterality: Right;   PORTACATH PLACEMENT N/A 08/24/2021   Procedure: INSERTION PORT-A-CATH;  Surgeon: [name], MD;  Location: [place];  Service: General;  Laterality: N/A;   RADIOACTIVE SEED GUIDED AXILLARY SENTINEL LYMPH NODE Right 07/25/2021   Procedure: RADIOACTIVE SEED GUIDED RIGHT AXILLARY NODE EXCISION;  Surgeon: [name], MD;  Location: [place];  Service: General;  Laterality: Right;   TISSUE EXPANDER PLACEMENT Right 12/04/2021   Procedure: REMOVAL RIGHT CHEST TISSUE EXPANDER;  Surgeon: Irene Limbo, MD;  Location: [place];  Service: Plastics;  Laterality: Right;   WISDOM TOOTH EXTRACTION  2001   Patient Active Problem List   Diagnosis Date Noted   Lower leg mass, left 05/02/2022   Port-A-Cath in place 08/25/2021   S/P mastectomy, right 07/25/2021   Genetic testing 07/01/2021   Family history of skin cancer 06/22/2021   Family history of brain tumor 06/22/2021   Malignant neoplasm of overlapping sites of right breast in female, estrogen receptor positive ([place]) 06/21/2021   Status post repeat low transverse cesarean section 03/03/2018  DM (diabetes mellitus) in pregnancy 03/02/2018   Incompetent cervix in pregnancy 03/02/2018    REFERRING DIAG: Right Breast Cancer  THERAPY DIAG:  Malignant neoplasm of overlapping sites of right breast in female, estrogen receptor positive ([place])  Stiffness of right shoulder, not elsewhere classified  Abnormal posture  Rationale for Evaluation and Treatment Rehabilitation  PERTINENT HISTORY: 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 She noted a palpable right breast mass. This is followed by a mammogram with a mass with calcifications as really throughout the upper outer quadrant and in the central breast. This measures 9.4 x 8.7 x 6.7 cm. On ultrasound she had 3 separate masses at 10, 11, and 12:00. The sizes are 1.7, 2.1, and 2.2 cm. There is also a single abnormal axillary lymph node that has undergone biopsy. 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 She is s/p right breast mastectomy with SLNB 0/7 LN on August 16 andl also had tissue expanders in place.  She is s/p chemotherapy for 6 months every other week and had radiation following ; She had emergency sugery to remove expander 12/04/21; completed radiation 04/04/22   PRECAUTIONS: Other: lymphedema risk Right UE  SUBJECTIVE:  "I'm sorry I'm so late! I thought my appt was tomorrow. I've been noticing some fullness that comes and goes at my lateral trunk but it goes away after I massage it a little."  PAIN:  Are you having pain? No Just reports tightness  at posterior Rt upper arm   OBJECTIVE: (objective measures completed at initial evaluation unless otherwise dated)   PALPATION: Tightness in right axilla and posterior shoulder along scapular muscles    OBSERVATIONS / OTHER ASSESSMENTS: no visible cording, right UE is visually larger but she is right handed. No pitting edema   SENSATION:            Light touch: Deficits lateral chest            POSTURE: rounded shoulders   UPPER EXTREMITY AROM/PROM:   A/PROM RIGHT   eval 06/08/2022  Shoulder extension 45  Shoulder flexion 145  Shoulder abduction 129  Shoulder internal rotation     Shoulder external rotation 95                          (Blank rows = not tested)   A/PROM LEFT   eval  Shoulder extension 45  Shoulder flexion 155  Shoulder abduction 176  Shoulder internal rotation    Shoulder external rotation 95                          (Blank rows = not tested)     CERVICAL AROM: All within normal limits:          UPPER EXTREMITY STRENGTH: WNL   LYMPHEDEMA ASSESSMENTS:    SURGERY TYPE/DATE: Right breast mastectomy with SLNB 07/25/2021 with reconstruction, Emergency removal of expander 12/04/2021   NUMBER OF LYMPH NODES REMOVED: 4/7   CHEMOTHERAPY: yes   RADIATION:yes   HORMONE TREATMENT: yes   INFECTIONS: yes   LYMPHEDEMA ASSESSMENTS:    LANDMARK RIGHT  eval  10 cm proximal to olecranon process 26.4  Olecranon process 25.2  10 cm proximal to ulnar styloid process 21.0  Just proximal to ulnar styloid process 15.5  Across hand at thumb web space 19.0  At base of 2nd digit 5.8  (Blank rows = not tested)   LANDMARK LEFT  eval  10 cm proximal to olecranon process 24.8  Olecranon process 24.0  10 cm proximal to ulnar styloid process 19.8  Just proximal to ulnar styloid process 15.3  Across hand at thumb web space 18.6  At base of 2nd digit 5.9  (Blank rows = not tested)           L-DEX LYMPHEDEMA SCREENING:   The patient was assessed using the L-Dex machine today to produce a lymphedema index baseline score. The patient will be reassessed on a regular basis (typically every 3 months) to obtain new L-Dex scores. If the score is > 6.5 points away from his/her baseline score indicating onset of subclinical lymphedema, it will be recommended to wear a compression garment for 4 weeks, 12 hours per day and then be reassessed. If the score continues to be > 6.5 points from baseline at reassessment, we will initiate lymphedema treatment. Assessing in this manner has a 95% rate of preventing clinically significant lymphedema.     L-DEX  FLOWSHEETS - 07/11/22 0800       L-DEX LYMPHEDEMA SCREENING   Measurement Type Unilateral    L-DEX MEASUREMENT EXTREMITY Upper Extremity    POSITION  Standing    DOMINANT SIDE Right    At Risk Side Right    BASELINE SCORE (UNILATERAL) 0    L-DEX SCORE (UNILATERAL) 1.1    VALUE CHANGE (UNILAT) 1.1                QUICK DASH SURVEY: not completed     TODAY'S TREATMENT:   07/11/22: Redid SOZO at beginning of session and great reductions noted today with a change of baseline being 1.1 as compared to >10 at last session. So educated pt while performing manual therapy that she does not have to wear compression sleeve all day now, but suggested she did still wear it during times of increased activity and especially when flying to [place] when she goes in Nov, which she verbalized a  good understanding of.  Manual Therapy STM: In supine and then Lt S/L with cocoa butter to Rt lateral trunk where pt reports tightness; also in Lt S/L to medial scapular border and upper trap where trigger points palpable but improving MLD: Rt inguinal nodes, Rt axillo-inguinal anastomosis and focused on fullness noted at lateral trunk. Scap Mobs: Lt S/L into protraction and retraction of Rt scapula  07/09/22: Manual Therapy STM: In supine and then Lt S/L with cocoa butter, soft tissue work with stroking, prolonged pressure on trigger points and myofascial release to tight muscles in posterior upper arm, lateral scapula and Rt upper trap.  SOZO redone, her change from baseline os still testing higher than 6.5 indicating subclinical lymphedema still present however this was done after manual therapy so will reassess this at beginning of session next to see if this makes a difference.   07/03/2022: Manual Therapy and therapeutic exercise   with coco butter in supine and sidelying, soft tissue work with stroking, prolonged pressure on trigger points and myofascial release to tight muscles especially around the scapula.   Instructed in deep breathing into right side body in side lying for better active rib excursion,  upper and lower trunk rotation stretches in sidelying and supine and trunk rotation and side bending in sitting.  7/19/023: Manual Therapy MLD: In Supine to Rt UE as follows: Short neck, 5 diaphragmatic breaths, Lt axillary and Rt inguinal nodes, anterior inter-axillary and Rt axillo-inguinal anastomosis, then focused on Rt UE working from proximal to distal then retracing all steps (lateral upper arm, medial to latera, then lateral again, cubital fossa, ant/post forearm and dorsum of hand and phalanges then retraced same steps) finishing with anastomosis and stationary circles over node beds. Reviewed importance of all steps of sequence while performing in answer to pts questions.  MFR with end P/ROM of Rt shoulder to Rt axilla over area of palpable cording.    PATIENT EDUCATION:  Education details: MLD to right UE Person educated: Patient Education method: Consulting civil engineer, Demonstration, and Handouts Education comprehension: verbalized understanding, returned demonstration, verbal cues required, tactile cues required, and needs further education     HOME EXERCISE PROGRAM: Advised pt to resume stretches atleast 2x's per day with clasped hands flexion, wall slides, stargazer and to perform MLD daily   ASSESSMENT:   CLINICAL IMPRESSION: Pts SOZO change from baseline score is now WNLs so discussed her not having to wear her compression sleeve 10 hrs/day now. She reports liking to wear it when she works so we will cont that, times of increased activity like exercising and when she flies. Continued with manual therapy focusing on areas of tightness that limit her end Rt shoulder A/ROM, especially when she performs some of her [place] dance techniques. Pt reports overall tightness is resolving.    OBJECTIVE IMPAIRMENTS decreased activity tolerance, decreased ROM, increased edema, impaired flexibility,  impaired UE functional use, postural dysfunction, and pain.    ACTIVITY LIMITATIONS    avoids heavier lifting, difficulty reaching to extremes on right   PARTICIPATION LIMITATIONS:  none just avoids doing certain things   PERSONAL FACTORS  Right breast CA s/p mastectomy, chemotherapy and radiation , diabetes are also affecting patient's functional outcome.    REHAB POTENTIAL: Excellent   CLINICAL DECISION MAKING: Stable/uncomplicated   EVALUATION COMPLEXITY: Low   GOALS: Goals reviewed with patient? Yes         SHORT TERM GOALS =LONG TERM GOALS: Target date: 07/20/2022     Pt will improve right shoulder flexion to WNL Baseline: see measurements at eval Goal status: INITIAL   2.  Pt will be independent with self MLD to prevent/treat lymphedema Baseline:  Goal status: INITIAL   3.  Pt will report decreased achiness in right UE by 50% or greater Baseline: 4/10 constant Goal status: INITIAL   4.  Pts SOZO screen will return to normal range  Baseline: 0, last measured 8.9 Goal status: INITIAL     PLAN: PT FREQUENCY: 2x/week   PT DURATION: 6 weeks   PLANNED INTERVENTIONS: Therapeutic exercises, Therapeutic activity, Neuromuscular re-education, Patient/Family education, Joint mobilization, Orthotic/Fit training, Manual lymph drainage, Vasopneumatic device, and Manual therapy   PLAN FOR NEXT SESSION: AA/PROM right shoulder, MFR techniques to cording, review self right UE MLD, update HEP prn   Collie Siad, PTA 07/11/22 10:07 AM

## 2022-07-16 ENCOUNTER — Inpatient Hospital Stay: Payer: 59

## 2022-07-16 ENCOUNTER — Encounter: Payer: Self-pay | Admitting: Adult Health

## 2022-07-16 ENCOUNTER — Inpatient Hospital Stay: Payer: 59 | Attending: Hematology and Oncology | Admitting: Adult Health

## 2022-07-16 ENCOUNTER — Other Ambulatory Visit: Payer: Self-pay

## 2022-07-16 VITALS — BP 106/73 | HR 88 | Temp 97.9°F | Resp 16 | Ht 67.0 in | Wt 158.1 lb

## 2022-07-16 DIAGNOSIS — Z79899 Other long term (current) drug therapy: Secondary | ICD-10-CM | POA: Insufficient documentation

## 2022-07-16 DIAGNOSIS — C50811 Malignant neoplasm of overlapping sites of right female breast: Secondary | ICD-10-CM | POA: Diagnosis present

## 2022-07-16 DIAGNOSIS — Z830 Family history of human immunodeficiency virus [HIV] disease: Secondary | ICD-10-CM | POA: Diagnosis not present

## 2022-07-16 DIAGNOSIS — Z13820 Encounter for screening for osteoporosis: Secondary | ICD-10-CM | POA: Insufficient documentation

## 2022-07-16 DIAGNOSIS — Z8249 Family history of ischemic heart disease and other diseases of the circulatory system: Secondary | ICD-10-CM | POA: Diagnosis not present

## 2022-07-16 DIAGNOSIS — Z833 Family history of diabetes mellitus: Secondary | ICD-10-CM | POA: Insufficient documentation

## 2022-07-16 DIAGNOSIS — R5383 Other fatigue: Secondary | ICD-10-CM | POA: Insufficient documentation

## 2022-07-16 DIAGNOSIS — R232 Flushing: Secondary | ICD-10-CM | POA: Insufficient documentation

## 2022-07-16 DIAGNOSIS — Z9049 Acquired absence of other specified parts of digestive tract: Secondary | ICD-10-CM | POA: Insufficient documentation

## 2022-07-16 DIAGNOSIS — Z170 Estrogen receptor positive status [ER+]: Secondary | ICD-10-CM | POA: Diagnosis not present

## 2022-07-16 DIAGNOSIS — Z818 Family history of other mental and behavioral disorders: Secondary | ICD-10-CM | POA: Insufficient documentation

## 2022-07-16 DIAGNOSIS — E2839 Other primary ovarian failure: Secondary | ICD-10-CM

## 2022-07-16 DIAGNOSIS — Z8379 Family history of other diseases of the digestive system: Secondary | ICD-10-CM | POA: Diagnosis not present

## 2022-07-16 DIAGNOSIS — Z85828 Personal history of other malignant neoplasm of skin: Secondary | ICD-10-CM | POA: Insufficient documentation

## 2022-07-16 DIAGNOSIS — Z888 Allergy status to other drugs, medicaments and biological substances status: Secondary | ICD-10-CM | POA: Diagnosis not present

## 2022-07-16 DIAGNOSIS — Z5112 Encounter for antineoplastic immunotherapy: Secondary | ICD-10-CM | POA: Diagnosis present

## 2022-07-16 DIAGNOSIS — Z811 Family history of alcohol abuse and dependence: Secondary | ICD-10-CM | POA: Insufficient documentation

## 2022-07-16 DIAGNOSIS — Z808 Family history of malignant neoplasm of other organs or systems: Secondary | ICD-10-CM | POA: Diagnosis not present

## 2022-07-16 DIAGNOSIS — Z98891 History of uterine scar from previous surgery: Secondary | ICD-10-CM | POA: Diagnosis not present

## 2022-07-16 LAB — VITAMIN D 25 HYDROXY (VIT D DEFICIENCY, FRACTURES): Vit D, 25-Hydroxy: 22.19 ng/mL — ABNORMAL LOW (ref 30–100)

## 2022-07-16 LAB — CMP (CANCER CENTER ONLY)
ALT: 26 U/L (ref 0–44)
AST: 19 U/L (ref 15–41)
Albumin: 4.7 g/dL (ref 3.5–5.0)
Alkaline Phosphatase: 73 U/L (ref 38–126)
Anion gap: 10 (ref 5–15)
BUN: 11 mg/dL (ref 6–20)
CO2: 27 mmol/L (ref 22–32)
Calcium: 10.2 mg/dL (ref 8.9–10.3)
Chloride: 100 mmol/L (ref 98–111)
Creatinine: 0.57 mg/dL (ref 0.44–1.00)
GFR, Estimated: >60 mL/min (ref >60)
Glucose, Bld: 255 mg/dL — ABNORMAL HIGH (ref 70–99)
Potassium: 4.1 mmol/L (ref 3.5–5.1)
Sodium: 137 mmol/L (ref 135–145)
Total Bilirubin: 0.4 mg/dL (ref 0.3–1.2)
Total Protein: 7.5 g/dL (ref 6.5–8.1)

## 2022-07-16 LAB — CBC WITH DIFFERENTIAL (CANCER CENTER ONLY)
Abs Immature Granulocytes: 0.01 10*3/uL (ref 0.00–0.07)
Basophils Absolute: 0 10*3/uL (ref 0.0–0.1)
Basophils Relative: 1 %
Eosinophils Absolute: 0.1 10*3/uL (ref 0.0–0.5)
Eosinophils Relative: 3 %
HCT: 44 % (ref 36.0–46.0)
Hemoglobin: 15 g/dL (ref 12.0–15.0)
Immature Granulocytes: 0 %
Lymphocytes Relative: 36 %
Lymphs Abs: 1.5 10*3/uL (ref 0.7–4.0)
MCH: 26.5 pg (ref 26.0–34.0)
MCHC: 34.1 g/dL (ref 30.0–36.0)
MCV: 77.7 fL — ABNORMAL LOW (ref 80.0–100.0)
Monocytes Absolute: 0.3 10*3/uL (ref 0.1–1.0)
Monocytes Relative: 7 %
Neutro Abs: 2.2 10*3/uL (ref 1.7–7.7)
Neutrophils Relative %: 53 %
Platelet Count: 213 10*3/uL (ref 150–400)
RBC: 5.66 MIL/uL — ABNORMAL HIGH (ref 3.87–5.11)
RDW: 14.4 % (ref 11.5–15.5)
WBC Count: 4.1 10*3/uL (ref 4.0–10.5)
nRBC: 0 % (ref 0.0–0.2)

## 2022-07-16 LAB — VITAMIN B12: Vitamin B-12: 198 pg/mL (ref 180–914)

## 2022-07-16 LAB — HEMOGLOBIN A1C
Hgb A1c MFr Bld: 7.4 % — ABNORMAL HIGH (ref 4.8–5.6)
Mean Plasma Glucose: 165.68 mg/dL

## 2022-07-16 MED ORDER — GOSERELIN ACETATE 3.6 MG ~~LOC~~ IMPL
3.6000 mg | DRUG_IMPLANT | SUBCUTANEOUS | Status: DC
  Administered 2022-07-16: 3.6 mg via SUBCUTANEOUS
  Filled 2022-07-16: qty 3.6

## 2022-07-16 MED ORDER — LETROZOLE 2.5 MG PO TABS: 2.5000 mg | ORAL_TABLET | Freq: Every day | ORAL | 3 refills | Status: DC

## 2022-07-16 NOTE — Progress Notes (Signed)
[employer] requisition faxed as well as progress note, pathology report, demographics and insurance information. Fax confirmation received.

## 2022-07-16 NOTE — Progress Notes (Signed)
SURVIVORSHIP VISIT:  BRIEF ONCOLOGIC HISTORY:  Oncology History  Malignant neoplasm of overlapping sites of right breast in female, estrogen receptor positive ([place])  06/16/2021 Initial Diagnosis   Palpable right breast mass: Breast MRI revealed extensive involvement of the right breast with mass and non-mass enhancement superior right breast mass measures 4.9 x 1.2 cm and together with non-mass enhancement measured 8.8 cm, enhancing mass LOQ 1.3 cm, left breast indeterminate 0.9 cm mass and a 0.8 cm mass UOQ, single abnormal lymph node Biopsy 10:00, 11:00 and 12:00: IDC with DCIS, biopsy right axillary lymph node: IDC, ER 75 to 95%, PR 90 to 95%, Ki-67 25%, HER2 negative on 1 biopsy and 2+ by IHC and FISH pending   06/21/2021 Cancer Staging   Staging form: Breast, AJCC 8th Edition - Clinical stage from 06/21/2021: Stage IIA (cT3, cN1, cM0, G2, ER+, PR+, HER2-) - Signed by [name], MD on 06/21/2021 Histologic grading system: 3 grade system   07/01/2021 Genetic Testing   Negative genetic testing:  No pathogenic variants detected on the [facility] BRCAplus panel (report date 07/01/2021) or the [facility] CancerNext-Expanded + RNAinsight panel (report date 07/01/2021).   The BRCAplus panel offered by [employer] and includes sequencing and deletion/duplication analysis for the following 8 genes: ATM, BRCA1, BRCA2, CDH1, CHEK2, PALB2, PTEN, and TP53. The CancerNext-Expanded + RNAinsight gene panel offered by [employer] and includes sequencing and rearrangement analysis for the following 77 genes: AIP, ALK, APC, ATM, AXIN2, BAP1, BARD1, BLM, BMPR1A, BRCA1, BRCA2, BRIP1, CDC73, CDH1, CDK4, CDKN1B, CDKN2A, CHEK2, CTNNA1, DICER1, FANCC, FH, FLCN, GALNT12, KIF1B, LZTR1, MAX, MEN1, MET, MLH1, MSH2, MSH3, MSH6, MUTYH, NBN, NF1, NF2, NTHL1, PALB2, PHOX2B, PMS2, POT1, PRKAR1A, PTCH1, PTEN, RAD51C, RAD51D, RB1, RECQL, RET, SDHA, SDHAF2, SDHB, SDHC, SDHD, SMAD4, SMARCA4, SMARCB1, SMARCE1, STK11, SUFU, TMEM127, TP53,  TSC1, TSC2, VHL and XRCC2 (sequencing and deletion/duplication); EGFR, EGLN1, HOXB13, KIT, MITF, PDGFRA, POLD1 and POLE (sequencing only); EPCAM and GREM1 (deletion/duplication only). RNA data is routinely analyzed for use in variant interpretation for all genes.   07/25/2021 Surgery   Right mastectomy: Foci of invasive ductal carcinoma measuring 1.1 cm grade 2 with extensive DCIS spanning 8.9 cm, margins negative, 4/7 lymph nodes, ER 75 to 95%, PR 9095%, HER2 negative, Ki-67 25%   08/25/2021 - 01/19/2022 Adjuvant Chemotherapy   Adjuvant chemotherapy: dose dense AC x 4 followed by weekly Taxol x 12.    02/26/2022 - 04/04/2022 Radiation Therapy   Site Technique Total Dose (Gy) Dose per Fx (Gy) Completed Fx Beam Energies  Chest Wall, Right: CW_R_IMN 3D 50.4/50.4 1.8 28/28 6X  Chest Wall, Right: CW_R_PAB_SCV 3D 50.4/50.4 1.8 28/28 6X, 10X     05/2022 -  Anti-estrogen oral therapy   Letrozole     INTERVAL HISTORY:  Allison Reed to review her survivorship care plan detailing her treatment course for breast cancer, as well as monitoring long-term side effects of that treatment, education regarding health maintenance, screening, and overall wellness and health promotion.     Overall, Allison Reed reports feeling quite well.  She is receiving the Zoladex every 4 weeks and tolerates it moderately well.  She does experience some mild hot flashes and notes dry skin.  She is ready for her hair to start growing a little bit faster and wants to know if she can take biotin.  Her biggest issue is tiredness and brain fogginess.  She has not been able to resume multitasking like she did prior to starting chemotherapy.  REVIEW OF SYSTEMS:  Review of  Systems  Constitutional:  Positive for fatigue. Negative for appetite change, chills, fever and unexpected weight change.  HENT:   Negative for hearing loss, lump/mass and trouble swallowing.   Eyes:  Negative for eye problems and icterus.  Respiratory:  Negative for chest  tightness, cough and shortness of breath.   Cardiovascular:  Negative for chest pain, leg swelling and palpitations.  Gastrointestinal:  Negative for abdominal distention, abdominal pain, constipation, diarrhea, nausea and vomiting.  Genitourinary:  Negative for difficulty urinating.   Musculoskeletal:  Negative for arthralgias.  Skin:  Negative for itching and rash.  Neurological:  Negative for dizziness, extremity weakness, headaches and numbness.  Hematological:  Negative for adenopathy. Does not bruise/bleed easily.  Psychiatric/Behavioral:  Negative for depression. The patient is not nervous/anxious.   Breast: Denies any new nodularity, masses, tenderness, nipple changes, or nipple discharge.      ONCOLOGY TREATMENT TEAM:  1. Surgeon:  Dr. Donne Hazel at [facility] 2. Medical Oncologist: Dr. Lindi Adie   3. Radiation Oncologist: Dr. Isidore Moos    PAST MEDICAL/SURGICAL HISTORY:  Past Medical History:  Diagnosis Date   Diabetes mellitus without complication ([place])    Type 2   Family history of brain tumor    Family history of skin cancer    Gestational diabetes    Headache    otc med prn   Heartburn in pregnancy    Incompetent cervix in pregnancy 04/2015   Missed abortion    no surgery required   Postpartum care following cesarean delivery (11/2) 10/12/2015   right breast ca 06/2021   UTI in pregnancy 03/2015   recent UTI, treatment completed   Past Surgical History:  Procedure Laterality Date   APPENDECTOMY  2006   BREAST RECONSTRUCTION WITH PLACEMENT OF TISSUE EXPANDER AND ALLODERM Right 07/25/2021   Procedure: RIGHT BREAST RECONSTRUCTION WITH PLACEMENT OF TISSUE EXPANDER AND ALLODERM;  Surgeon: Irene Limbo, MD;  Location: [facility];  Service: Plastics;  Laterality: Right;   CERCLAGE LAPAROSCOPIC ABDOMINAL N/A 04/15/2015   Procedure: LAPAROSCOPIC TRANSABDOMINAL CERVCOISTHMIC CERCLAGE;  Surgeon: Governor Specking, MD;  Location: [facility];  Service:  Gynecology;  Laterality: N/A;   CERVICAL CERCLAGE  02/2011   vaginal   CESAREAN SECTION N/A 10/12/2015   Procedure: CESAREAN SECTION;  Surgeon: Princess Bruins, MD;  Location: [facility];  Service: Obstetrics;  Laterality: N/A;   CESAREAN SECTION N/A 03/03/2018   Procedure: Repeat CESAREAN SECTION/Removal of Abdominal Cerclage;  Surgeon: Azucena Fallen, MD;  Location: [facility];  Service: Obstetrics;  Laterality: N/A;  EDD: 03/28/18   MASTECTOMY W/ SENTINEL NODE BIOPSY Right 07/25/2021   Procedure: RIGHT MASTECTOMY WITH AXILLARY SENTINEL LYMPH NODE BIOPSY;  Surgeon: [name], MD;  Location: [facility];  Service: General;  Laterality: Right;   PORTACATH PLACEMENT N/A 08/24/2021   Procedure: INSERTION PORT-A-CATH;  Surgeon: [name], MD;  Location: [facility];  Service: General;  Laterality: N/A;   RADIOACTIVE SEED GUIDED AXILLARY SENTINEL LYMPH NODE Right 07/25/2021   Procedure: RADIOACTIVE SEED GUIDED RIGHT AXILLARY NODE EXCISION;  Surgeon: [name], MD;  Location: [facility];  Service: General;  Laterality: Right;   TISSUE EXPANDER PLACEMENT Right 12/04/2021   Procedure: REMOVAL RIGHT CHEST TISSUE EXPANDER;  Surgeon: Irene Limbo, MD;  Location: [facility];  Service: Plastics;  Laterality: Right;   WISDOM TOOTH EXTRACTION  2001     ALLERGIES:  Allergies  Allergen Reactions   Betadine [Povidone Iodine] Rash   Tape Rash  CURRENT MEDICATIONS:  Outpatient Encounter Medications as of 07/16/2022  Medication Sig   Empagliflozin-metFORMIN HCl ER (SYNJARDY XR) 09-999 MG TB24 Take by mouth.   Multiple Vitamins-Minerals (WOMENS DAILY FORMULA PO) Take by mouth daily.   ONETOUCH VERIO test strip 1 each by Other route 2 (two) times daily.   TRADJENTA 5 MG TABS tablet Take 5 mg by mouth daily.   No facility-administered encounter medications on file as of 07/16/2022.     ONCOLOGIC FAMILY HISTORY:  Family History   Problem Relation Age of Onset   Diabetes Father    HIV Maternal Uncle    Skin cancer Maternal Uncle 39   HIV Maternal Uncle    Diabetes Maternal Grandmother    Hypertension Maternal Grandmother    Alzheimer's disease Maternal Grandmother    Cirrhosis Maternal Grandfather    Alcoholism Maternal Grandfather    Other Paternal Grandfather        brain hemorrhage     SOCIAL HISTORY:  Social History   Socioeconomic History   Marital status: Married    Spouse name: Dr [name]   Number of children: Not on file   Years of education: Not on file   Highest education level: Not on file  Occupational History   Not on file  Tobacco Use   Smoking status: Never   Smokeless tobacco: Never  Vaping Use   Vaping Use: Never used  Substance and Sexual Activity   Alcohol use: Yes    Comment: social   Drug use: No   Sexual activity: Yes    Birth control/protection: None  Other Topics Concern   Not on file  Social History Narrative   Not on file   Social Determinants of Health   Financial Resource Strain: Not on file  Food Insecurity: Not on file  Transportation Needs: Not on file  Physical Activity: Not on file  Stress: Not on file  Social Connections: Not on file  Intimate Partner Violence: Not on file     OBSERVATIONS/OBJECTIVE:  BP 106/73 (BP Location: Left Arm, Patient Position: Sitting)   Pulse 88   Temp 97.9 F (36.6 C) (Temporal)   Resp 16   Ht $R'5\' 7"'DH$  (1.702 m)   Wt 158 lb 1.6 oz (71.7 kg)   SpO2 99%   BMI 24.76 kg/m  GENERAL: Patient is a well appearing female in no acute distress HEENT:  Sclerae anicteric.  Oropharynx clear and moist. No ulcerations or evidence of oropharyngeal candidiasis. Neck is supple.  NODES:  No cervical, supraclavicular, or axillary lymphadenopathy palpated.  BREAST EXAM:  Deferred. LUNGS:  Clear to auscultation bilaterally.  No wheezes or rhonchi. HEART:  Regular rate and rhythm. No murmur appreciated. ABDOMEN:  Soft, nontender.   Positive, normoactive bowel sounds. No organomegaly palpated. MSK:  No focal spinal tenderness to palpation. Full range of motion bilaterally in the upper extremities. EXTREMITIES:  No peripheral edema.   SKIN:  Clear with no obvious rashes or skin changes. No nail dyscrasia. NEURO:  Nonfocal. Well oriented.  Appropriate affect.  LABORATORY DATA:  None for this visit.  DIAGNOSTIC IMAGING:  None for this visit.      ASSESSMENT AND PLAN:  Ms.. Reed is a pleasant 46 y.o. female with Stage 1B rightbreast invasive ductal carcinoma, ER+/PR+/HER2-, diagnosed in July 2022, treated with mastectomy, adjuvant chemotherapy, adjuvant radiation therapy, goserelin every 4 weeks and anti-estrogen therapy with letrozole beginning in August 2023.  She presents to the Survivorship Clinic for our initial meeting and routine  follow-up post-completion of treatment for breast cancer.    1. Stage 1B right breast cancer:  Allison Reed is continuing to recover from definitive treatment for breast cancer. She will follow-up with her medical oncologist, Dr. Lindi Adie in 2 months with history and physical exam per surveillance protocol.  She will continue to receive Zoladex every 4 weeks.  She may be a candidate to receive adjuvant Verzenio and will follow-up with Dr. Lindi Adie about this in 2 months.  We also discussed Signatera testing which she is interested in receiving and I ordered this as well today.  Her mammogram is due; orders placed today. Today, a comprehensive survivorship care plan and treatment summary was reviewed with the patient today detailing her breast cancer diagnosis, treatment course, potential late/long-term effects of treatment, appropriate follow-up care with recommendations for the future, and patient education resources.  A copy of this summary, along with a letter will be sent to the patient's primary care provider via mail/fax/In Basket message after today's visit.    2. Bone health: Considering her  treatment with Zoladex and letrozole she should undergo bone density testing.  I placed orders for this to be completed at the breast center when she undergoes her mammogram however if they are unable to accommodate her I can always change the order for the bone density to be completed at [facility].Marland Kitchen  She was given education on specific activities to promote bone health.  3. Cancer screening:  Due to Allison Reed's history and her age, she should receive screening for skin cancers, colon cancer (at age 10), and gynecologic cancers.  The information and recommendations are listed on the patient's comprehensive care plan/treatment summary and were reviewed in detail with the patient.    4. Health maintenance and wellness promotion: Allison Reed was encouraged to consume 5-7 servings of fruits and vegetables per day. We reviewed the "Nutrition Rainbow" handout.  She was also encouraged to engage in moderate to vigorous exercise for 30 minutes per day most days of the week. We discussed the [employer] fitness program, which is designed for cancer survivors to help them become more physically fit after cancer treatments.  She was instructed to limit her alcohol consumption and continue to abstain from tobacco use.     5. Support services/counseling: It is not uncommon for this period of the patient's cancer care trajectory to be one of many emotions and stressors.  She was given information regarding our available services and encouraged to contact me with any questions or for help enrolling in any of our support group/programs.   6.  Fatigue: I have added on labs to help evaluate the etiology including CBC TSH B12 vitamin D level kidney function liver function at her request due to her history of diabetes I ordered a hemoglobin A1c as well.   Follow up instructions:    -Return to cancer center in 2 months -Mammogram due  -Bone density due -She is welcome to return back to the Survivorship  Clinic at any time; no additional follow-up needed at this time.  -Consider referral back to survivorship as a long-term survivor for continued surveillance  The patient was provided an opportunity to ask questions and all were answered. The patient agreed with the plan and demonstrated an understanding of the instructions.   Total encounter time:45 minutes*in face-to-face visit time, chart review, lab review, care coordination, order entry, and documentation of the encounter time.  Wilber Bihari, NP 07/16/22 2:29 PM Medical Oncology and  Hematology [facility] [place], [place] [address] Tel. [phone]    Fax. [phone]  *Total Encounter Time as defined by the Centers for Medicare and Medicaid Services includes, in addition to the face-to-face time of a patient visit (documented in the note above) non-face-to-face time: obtaining and reviewing outside history, ordering and reviewing medications, tests or procedures, care coordination (communications with other health care professionals or caregivers) and documentation in the medical record.

## 2022-07-17 ENCOUNTER — Other Ambulatory Visit (HOSPITAL_COMMUNITY): Payer: Self-pay

## 2022-07-17 ENCOUNTER — Other Ambulatory Visit: Payer: Self-pay | Admitting: Adult Health

## 2022-07-17 ENCOUNTER — Ambulatory Visit: Payer: 59

## 2022-07-17 ENCOUNTER — Encounter: Payer: Self-pay | Admitting: Hematology and Oncology

## 2022-07-17 DIAGNOSIS — C50811 Malignant neoplasm of overlapping sites of right female breast: Secondary | ICD-10-CM | POA: Diagnosis not present

## 2022-07-17 DIAGNOSIS — Z170 Estrogen receptor positive status [ER+]: Secondary | ICD-10-CM

## 2022-07-17 DIAGNOSIS — M25611 Stiffness of right shoulder, not elsewhere classified: Secondary | ICD-10-CM

## 2022-07-17 DIAGNOSIS — R293 Abnormal posture: Secondary | ICD-10-CM

## 2022-07-17 LAB — TSH: TSH: 2.089 u[IU]/mL (ref 0.350–4.500)

## 2022-07-17 MED ORDER — ERGOCALCIFEROL 1.25 MG (50000 UT) PO CAPS
50000.0000 [IU] | ORAL_CAPSULE | ORAL | 0 refills | Status: AC
  Filled 2022-07-17: qty 12, 84d supply, fill #0

## 2022-07-17 NOTE — Therapy (Signed)
OUTPATIENT PHYSICAL THERAPY TREATMENT NOTE   Patient Name: Allison Reed MRN: [id number] DOB:03-Sep-1976, 46 y.o., female Today's Date: 07/17/2022  PCP: [name] MD REFERRING PROVIDER: [name], MD  END OF SESSION:   PT End of Session - 07/17/22 0911     Visit Number 6    Number of Visits 12    Date for PT Re-Evaluation 07/20/22    PT Start Time 0908   pt arrived late   PT Stop Time 0953    PT Time Calculation (min) 45 min    Activity Tolerance Patient tolerated treatment well    Behavior During Therapy [facility] for tasks assessed/performed             Past Medical History:  Diagnosis Date   Diabetes mellitus without complication ([place])    Type 2   Family history of brain tumor    Family history of skin cancer    Gestational diabetes    Headache    otc med prn   Heartburn in pregnancy    Incompetent cervix in pregnancy 04/2015   Missed abortion    no surgery required   Postpartum care following cesarean delivery (11/2) 10/12/2015   right breast ca 06/2021   UTI in pregnancy 03/2015   recent UTI, treatment completed   Past Surgical History:  Procedure Laterality Date   APPENDECTOMY  2006   BREAST RECONSTRUCTION WITH PLACEMENT OF TISSUE EXPANDER AND ALLODERM Right 07/25/2021   Procedure: RIGHT BREAST RECONSTRUCTION WITH PLACEMENT OF TISSUE EXPANDER AND ALLODERM;  Surgeon: Irene Limbo, MD;  Location: [place];  Service: Plastics;  Laterality: Right;   CERCLAGE LAPAROSCOPIC ABDOMINAL N/A 04/15/2015   Procedure: LAPAROSCOPIC TRANSABDOMINAL CERVCOISTHMIC CERCLAGE;  Surgeon: Governor Specking, MD;  Location: [facility];  Service: Gynecology;  Laterality: N/A;   CERVICAL CERCLAGE  02/2011   vaginal   CESAREAN SECTION N/A 10/12/2015   Procedure: CESAREAN SECTION;  Surgeon: Princess Bruins, MD;  Location: [facility];  Service: Obstetrics;  Laterality: N/A;   CESAREAN SECTION N/A 03/03/2018   Procedure: Repeat CESAREAN SECTION/Removal of Abdominal Cerclage;   Surgeon: Azucena Fallen, MD;  Location: [place];  Service: Obstetrics;  Laterality: N/A;  EDD: 03/28/18   MASTECTOMY W/ SENTINEL NODE BIOPSY Right 07/25/2021   Procedure: RIGHT MASTECTOMY WITH AXILLARY SENTINEL LYMPH NODE BIOPSY;  Surgeon: [name], MD;  Location: [place];  Service: General;  Laterality: Right;   PORTACATH PLACEMENT N/A 08/24/2021   Procedure: INSERTION PORT-A-CATH;  Surgeon: [name], MD;  Location: [place];  Service: General;  Laterality: N/A;   RADIOACTIVE SEED GUIDED AXILLARY SENTINEL LYMPH NODE Right 07/25/2021   Procedure: RADIOACTIVE SEED GUIDED RIGHT AXILLARY NODE EXCISION;  Surgeon: [name], MD;  Location: [place];  Service: General;  Laterality: Right;   TISSUE EXPANDER PLACEMENT Right 12/04/2021   Procedure: REMOVAL RIGHT CHEST TISSUE EXPANDER;  Surgeon: Irene Limbo, MD;  Location: [place];  Service: Plastics;  Laterality: Right;   WISDOM TOOTH EXTRACTION  2001   Patient Active Problem List   Diagnosis Date Noted   Lower leg mass, left 05/02/2022   Port-A-Cath in place 08/25/2021   S/P mastectomy, right 07/25/2021   Genetic testing 07/01/2021   Family history of skin cancer 06/22/2021   Family history of brain tumor 06/22/2021   Malignant neoplasm of overlapping sites of right breast in female, estrogen receptor positive ([place]) 06/21/2021   Status post repeat low transverse cesarean section 03/03/2018   DM (diabetes  mellitus) in pregnancy 03/02/2018   Incompetent cervix in pregnancy 03/02/2018    REFERRING DIAG: Right Breast Cancer  THERAPY DIAG:  Malignant neoplasm of overlapping sites of right breast in female, estrogen receptor positive ([facility])  Stiffness of right shoulder, not elsewhere classified  Abnormal posture  Rationale for Evaluation and Treatment Rehabilitation  PERTINENT HISTORY: 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 She noted a  palpable right breast mass. This is followed by a mammogram with a mass with calcifications as really throughout the upper outer quadrant and in the central breast. This measures 9.4 x 8.7 x 6.7 cm. On ultrasound she had 3 separate masses at 10, 11, and 12:00. The sizes are 1.7, 2.1, and 2.2 cm. There is also a single abnormal axillary lymph node that has undergone biopsy. 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 She is s/p right breast mastectomy with SLNB 0/7 LN on August 16 andl also had tissue expanders in place.  She is s/p chemotherapy for 6 months every other week and had radiation following ; She had emergency sugery to remove expander 12/04/21; completed radiation 04/04/22   PRECAUTIONS: Other: lymphedema risk Right UE  SUBJECTIVE:  I saw the nurse in my survivorship program. She told me I am overall doing well but I told her I still want to get rid of the last little bit of tightness I have that limits some of my [place] dance moves. I do believe that the cording is finally gone though.   PAIN:  Are you having pain? No Just reports tightness at Rt lateral trunk inferior to axilla   OBJECTIVE: (objective measures completed at initial evaluation unless otherwise dated)   PALPATION: Tightness in right axilla and posterior shoulder along scapular muscles    OBSERVATIONS / OTHER ASSESSMENTS: no visible cording, right UE is visually larger but she is right handed. No pitting edema   SENSATION:            Light touch: Deficits lateral chest            POSTURE: rounded shoulders   UPPER EXTREMITY AROM/PROM:   A/PROM RIGHT   eval 06/08/2022  Shoulder extension 45   Shoulder flexion 145  Shoulder abduction 129  Shoulder internal rotation    Shoulder external rotation 95                          (Blank rows = not tested)   A/PROM LEFT   eval  Shoulder extension 45  Shoulder flexion 155  Shoulder abduction 176  Shoulder internal rotation    Shoulder external rotation 95                          (Blank rows = not tested)     CERVICAL AROM: All within normal limits:          UPPER EXTREMITY STRENGTH: WNL   LYMPHEDEMA ASSESSMENTS:    SURGERY TYPE/DATE: Right breast mastectomy with SLNB 07/25/2021 with reconstruction, Emergency removal of expander 12/04/2021   NUMBER OF LYMPH NODES REMOVED: 4/7   CHEMOTHERAPY: yes  RADIATION:yes   HORMONE TREATMENT: yes   INFECTIONS: yes   LYMPHEDEMA ASSESSMENTS:    LANDMARK RIGHT  eval  10 cm proximal to olecranon process 26.4  Olecranon process 25.2  10 cm proximal to ulnar styloid process 21.0  Just proximal to ulnar styloid process 15.5  Across hand at thumb web space 19.0  At base of 2nd digit 5.8  (Blank rows = not tested)   LANDMARK LEFT  eval  10 cm proximal to olecranon process 24.8  Olecranon process 24.0  10 cm proximal to ulnar styloid process 19.8  Just proximal to ulnar styloid process 15.3  Across hand at thumb web space 18.6  At base of 2nd digit 5.9  (Blank rows = not tested)           L-DEX LYMPHEDEMA SCREENING:   The patient was assessed using the L-Dex machine today to produce a lymphedema index baseline score. The patient will be reassessed on a regular basis (typically every 3 months) to obtain new L-Dex scores. If the score is > 6.5 points away from his/her baseline score indicating onset of subclinical lymphedema, it will be recommended to wear a compression garment for 4 weeks, 12 hours per day and then be reassessed. If the score continues to be > 6.5 points from baseline at reassessment, we will initiate lymphedema treatment. Assessing in this manner has a  95% rate of preventing clinically significant lymphedema.          QUICK DASH SURVEY: not completed     TODAY'S TREATMENT:   07/17/22: Manual Therapy STM: In supine and then Lt S/L with cocoa butter to Rt lateral trunk/lateral scapula border where deep trigger points palpable today that were tender for pt; also in Lt S/L to medial scapular border and upper trap where trigger points palpable MLD: SCF, 5 diaphragmatic breaths, Rt  inguinal nodes, Rt axillo-inguinal anastomosis and focused on fullness noted at lateral trunk. Scap Mobs: Lt S/L into protraction and retraction of Rt scapula  07/11/22: Redid SOZO at beginning of session and great reductions noted today with a change of baseline being 1.1 as compared to >10 at last session. So educated pt while performing manual therapy that she does not have to wear compression sleeve all day now, but suggested she did still wear it during times of increased activity and especially when flying to [place] when she goes in Nov, which she verbalized a  good understanding of.  Manual Therapy STM: In supine and then Lt S/L with cocoa butter to Rt lateral trunk where pt reports tightness; also in Lt S/L to medial scapular border and upper trap where trigger points palpable but improving MLD: Rt inguinal nodes, Rt axillo-inguinal anastomosis and focused on fullness noted at lateral trunk. Scap Mobs: Lt S/L into protraction and retraction of Rt scapula  07/09/22: Manual Therapy STM: In supine and then Lt S/L with cocoa butter, soft tissue work with stroking, prolonged pressure on trigger points and myofascial release to tight muscles in posterior upper arm, lateral scapula and Rt upper trap.  SOZO redone, her change from baseline os still testing higher than 6.5 indicating subclinical lymphedema still present however this was done after manual therapy so will reassess this at beginning of session next to see if this makes a difference.     PATIENT  EDUCATION:  Education details: MLD to right UE Person educated: Patient Education method: Explanation, Demonstration, and Handouts Education comprehension: verbalized understanding, returned demonstration, verbal cues required, tactile cues required,  and needs further education     HOME EXERCISE PROGRAM: Advised pt to resume stretches atleast 2x's per day with clasped hands flexion, wall slides, stargazer and to perform MLD daily   ASSESSMENT:   CLINICAL IMPRESSION: As pts superficial muscular tightness has improved was able to palpate deeper trigger points on anterior surface of lateral aspect of scapula today. These were very tender to touch and worked on trigger point release here with Rt shoulder P/ROM into D2 to facilitate release of trigger points. She reports feeling some improved by end of session as we were able to find new trigger points today. Pt has one more session before going out of town.    OBJECTIVE IMPAIRMENTS decreased activity tolerance, decreased ROM, increased edema, impaired flexibility, impaired UE functional use, postural dysfunction, and pain.    ACTIVITY LIMITATIONS    avoids heavier lifting, difficulty reaching to extremes on right   PARTICIPATION LIMITATIONS:  none just avoids doing certain things   PERSONAL FACTORS  Right breast CA s/p mastectomy, chemotherapy and radiation , diabetes are also affecting patient's functional outcome.    REHAB POTENTIAL: Excellent   CLINICAL DECISION MAKING: Stable/uncomplicated   EVALUATION COMPLEXITY: Low   GOALS: Goals reviewed with patient? Yes         SHORT TERM GOALS =LONG TERM GOALS: Target date: 07/20/2022     Pt will improve right shoulder flexion to WNL Baseline: see measurements at eval Goal status: INITIAL   2.  Pt will be independent with self MLD to prevent/treat lymphedema Baseline:  Goal status: INITIAL   3.  Pt will report decreased achiness in right UE by 50% or greater Baseline: 4/10  constant Goal status: INITIAL   4.  Pts SOZO screen will return to normal range  Baseline: 0, last measured 8.9 Goal status: INITIAL     PLAN: PT FREQUENCY: 2x/week   PT DURATION: 6 weeks   PLANNED INTERVENTIONS: Therapeutic exercises, Therapeutic activity, Neuromuscular re-education, Patient/Family education, Joint mobilization, Orthotic/Fit training, Manual lymph drainage, Vasopneumatic device, and Manual therapy   PLAN FOR NEXT SESSION: AA/PROM right shoulder, STM to Rt upper quadrant as done today, review self right UE MLD, update HEP prn   Collie Siad, PTA 07/17/22 10:04 AM

## 2022-07-17 NOTE — Progress Notes (Signed)
Reviewed Allison Reed's labs with her.  I recommend Vitamin B12 sublingual 1000 mcg daily which can be obtained otc, vitamin d2 50000 IU weekly.  I recommended that we check her vitamin d level in 12 weeks and check intrinsic factor and antiparietal antibody.  She would like to check these in a few months, will order to be checked with vitamin d level.    Wilber Bihari, NP 07/17/22 3:07 PM Medical Oncology and Hematology [facility] [place], [place] [address] Tel. [phone]    Fax. [phone]

## 2022-07-19 ENCOUNTER — Ambulatory Visit: Payer: 59

## 2022-07-19 DIAGNOSIS — R293 Abnormal posture: Secondary | ICD-10-CM

## 2022-07-19 DIAGNOSIS — Z170 Estrogen receptor positive status [ER+]: Secondary | ICD-10-CM

## 2022-07-19 DIAGNOSIS — M25611 Stiffness of right shoulder, not elsewhere classified: Secondary | ICD-10-CM

## 2022-07-19 DIAGNOSIS — C50811 Malignant neoplasm of overlapping sites of right female breast: Secondary | ICD-10-CM | POA: Diagnosis not present

## 2022-07-19 NOTE — Therapy (Signed)
OUTPATIENT PHYSICAL THERAPY TREATMENT NOTE   Patient Name: Allison Reed MRN: [id number] DOB:1976/06/14, 46 y.o., female Today's Date: 07/19/2022  PCP: [name] MD REFERRING PROVIDER: [name], MD  END OF SESSION:   PT End of Session - 07/19/22 1152     Visit Number 7    Number of Visits 12    Date for PT Re-Evaluation 07/20/22    PT Start Time 3532    PT Stop Time 9924    PT Time Calculation (min) 56 min    Activity Tolerance Patient tolerated treatment well    Behavior During Therapy [facility] for tasks assessed/performed             Past Medical History:  Diagnosis Date   Diabetes mellitus without complication ([place])    Type 2   Family history of brain tumor    Family history of skin cancer    Gestational diabetes    Headache    otc med prn   Heartburn in pregnancy    Incompetent cervix in pregnancy 04/2015   Missed abortion    no surgery required   Postpartum care following cesarean delivery (11/2) 10/12/2015   right breast ca 06/2021   UTI in pregnancy 03/2015   recent UTI, treatment completed   Past Surgical History:  Procedure Laterality Date   APPENDECTOMY  2006   BREAST RECONSTRUCTION WITH PLACEMENT OF TISSUE EXPANDER AND ALLODERM Right 07/25/2021   Procedure: RIGHT BREAST RECONSTRUCTION WITH PLACEMENT OF TISSUE EXPANDER AND ALLODERM;  Surgeon: Irene Limbo, MD;  Location: [place];  Service: Plastics;  Laterality: Right;   CERCLAGE LAPAROSCOPIC ABDOMINAL N/A 04/15/2015   Procedure: LAPAROSCOPIC TRANSABDOMINAL CERVCOISTHMIC CERCLAGE;  Surgeon: Governor Specking, MD;  Location: [facility];  Service: Gynecology;  Laterality: N/A;   CERVICAL CERCLAGE  02/2011   vaginal   CESAREAN SECTION N/A 10/12/2015   Procedure: CESAREAN SECTION;  Surgeon: Princess Bruins, MD;  Location: [facility] [facility];  Service: Obstetrics;  Laterality: N/A;   CESAREAN SECTION N/A 03/03/2018   Procedure: Repeat CESAREAN SECTION/Removal of Abdominal Cerclage;  Surgeon: Azucena Fallen, MD;  Location: [place];  Service: Obstetrics;  Laterality: N/A;  EDD: 03/28/18   MASTECTOMY W/ SENTINEL NODE BIOPSY Right 07/25/2021   Procedure: RIGHT MASTECTOMY WITH AXILLARY SENTINEL LYMPH NODE BIOPSY;  Surgeon: [name], MD;  Location: [place];  Service: General;  Laterality: Right;   PORTACATH PLACEMENT N/A 08/24/2021   Procedure: INSERTION PORT-A-CATH;  Surgeon: [name], MD;  Location: [place];  Service: General;  Laterality: N/A;   RADIOACTIVE SEED GUIDED AXILLARY SENTINEL LYMPH NODE Right 07/25/2021   Procedure: RADIOACTIVE SEED GUIDED RIGHT AXILLARY NODE EXCISION;  Surgeon: [name], MD;  Location: [place];  Service: General;  Laterality: Right;   TISSUE EXPANDER PLACEMENT Right 12/04/2021   Procedure: REMOVAL RIGHT CHEST TISSUE EXPANDER;  Surgeon: Irene Limbo, MD;  Location: [place];  Service: Plastics;  Laterality: Right;   WISDOM TOOTH EXTRACTION  2001   Patient Active Problem List   Diagnosis Date Noted   Lower leg mass, left 05/02/2022   Port-A-Cath in place 08/25/2021   S/P mastectomy, right 07/25/2021   Genetic testing 07/01/2021   Family history of skin cancer 06/22/2021   Family history of brain tumor 06/22/2021   Malignant neoplasm of overlapping sites of right breast in female, estrogen receptor positive ([place]) 06/21/2021   Status post repeat low transverse cesarean section 03/03/2018   DM (diabetes mellitus) in pregnancy 03/02/2018  Incompetent cervix in pregnancy 03/02/2018    REFERRING DIAG: Right Breast Cancer  THERAPY DIAG:  Malignant neoplasm of overlapping sites of right breast in female, estrogen receptor positive ([place])  Stiffness of right shoulder, not elsewhere classified  Abnormal posture  Rationale for Evaluation and Treatment Rehabilitation  PERTINENT HISTORY: 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 She noted a palpable right  breast mass. This is followed by a mammogram with a mass with calcifications as really throughout the upper outer quadrant and in the central breast. This measures 9.4 x 8.7 x 6.7 cm. On ultrasound she had 3 separate masses at 10, 11, and 12:00. The sizes are 1.7, 2.1, and 2.2 cm. There is also a single abnormal axillary lymph node that has undergone biopsy. 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 She is s/p right breast mastectomy with SLNB 0/7 LN on August 16 andl also had tissue expanders in place.  She is s/p chemotherapy for 6 months every other week and had radiation following ; She had emergency sugery to remove expander 12/04/21; completed radiation 04/04/22   PRECAUTIONS: Other: lymphedema risk Right UE  SUBJECTIVE:  I can tell we worked on some new trigger points last time. When I woke up yesterday my Rt hand and arm felt looser than they had in awhile. I also had some new soreness in my Rt upper arm so I know we are making new progress. I leave for [place] today with my family and my next appt with you is in 2 weeks.   PAIN:  Are you having pain? No Just reports tightness at Rt lateral trunk inferior to axilla   OBJECTIVE: (objective measures completed at initial evaluation unless otherwise dated)   PALPATION: Tightness in right axilla and posterior shoulder along scapular muscles    OBSERVATIONS / OTHER ASSESSMENTS: no visible cording, right UE is visually larger but she is right handed. No pitting edema   SENSATION:            Light touch: Deficits lateral chest            POSTURE: rounded shoulders   UPPER EXTREMITY AROM/PROM:   A/PROM RIGHT   eval 06/08/2022   Shoulder extension 45  Shoulder flexion 145  Shoulder abduction 129  Shoulder internal rotation    Shoulder external rotation 95                          (Blank rows = not tested)   A/PROM LEFT   eval  Shoulder extension 45  Shoulder flexion 155  Shoulder abduction 176  Shoulder internal rotation    Shoulder external rotation 95                          (Blank rows = not tested)     CERVICAL AROM: All within normal limits:          UPPER EXTREMITY STRENGTH: WNL   LYMPHEDEMA ASSESSMENTS:    SURGERY TYPE/DATE: Right breast mastectomy with SLNB 07/25/2021 with reconstruction, Emergency removal of expander 12/04/2021   NUMBER OF LYMPH NODES REMOVED:  4/7   CHEMOTHERAPY: yes   RADIATION:yes   HORMONE TREATMENT: yes   INFECTIONS: yes   LYMPHEDEMA ASSESSMENTS:    LANDMARK RIGHT  eval  10 cm proximal to olecranon process 26.4  Olecranon process 25.2  10 cm proximal to ulnar styloid process 21.0  Just proximal to ulnar styloid process 15.5  Across hand at thumb web space 19.0  At base of 2nd digit 5.8  (Blank rows = not tested)   LANDMARK LEFT  eval  10 cm proximal to olecranon process 24.8  Olecranon process 24.0  10 cm proximal to ulnar styloid process 19.8  Just proximal to ulnar styloid process 15.3  Across hand at thumb web space 18.6  At base of 2nd digit 5.9  (Blank rows = not tested)           L-DEX LYMPHEDEMA SCREENING:   The patient was assessed using the L-Dex machine today to produce a lymphedema index baseline score. The patient will be reassessed on a regular basis (typically every 3 months) to obtain new L-Dex scores. If the score is > 6.5 points away from his/her baseline score indicating onset of subclinical lymphedema, it will be recommended to wear a compression garment for 4 weeks, 12 hours per day and then be reassessed. If the score continues to be > 6.5 points from baseline at reassessment, we will initiate lymphedema treatment.  Assessing in this manner has a 95% rate of preventing clinically significant lymphedema.          QUICK DASH SURVEY: not completed     TODAY'S TREATMENT:    07/19/22: Manual Therapy STM: In supine and then Lt S/L with cocoa butter to Rt lateral trunk/lateral scapula border where deep trigger points palpable today that were still tender for pt but some improved from last session with less "pinching" feeling reported; also in Lt S/L to medial scapular border and upper trap where trigger points palpable Scap Mobs: Lt S/L into protraction and retraction of Rt scapula  07/17/22: Manual Therapy STM: In supine and then Lt S/L with cocoa butter to Rt lateral trunk/lateral scapula border where deep trigger points palpable today that were tender for pt; also in Lt S/L to medial scapular border and upper trap where trigger points palpable MLD: SCF, 5 diaphragmatic breaths, Rt  inguinal nodes, Rt axillo-inguinal anastomosis and focused on fullness noted at lateral trunk. Scap Mobs: Lt S/L into protraction and retraction of Rt scapula  07/11/22: Redid SOZO at beginning of session and great reductions noted today with a change of baseline being 1.1 as compared to >10 at last session. So educated pt while performing manual therapy that she does not have to wear compression sleeve all day now, but suggested she did still wear it during times of increased activity and especially when flying to [place] when she goes in Nov, which she verbalized a  good understanding of.  Manual Therapy STM: In supine and then Lt S/L with cocoa butter to Rt lateral trunk where pt reports tightness; also in Lt S/L to medial scapular border and upper trap where trigger points palpable but improving MLD: Rt inguinal nodes, Rt axillo-inguinal anastomosis and focused on fullness noted at lateral trunk. Scap Mobs: Lt S/L into protraction and retraction of Rt scapula     PATIENT EDUCATION:  Education details: MLD to right  UE Person educated: Patient Education method: Explanation, Demonstration, and Handouts Education comprehension: verbalized understanding, returned demonstration, verbal cues required, tactile cues required, and needs further education  HOME EXERCISE PROGRAM: Advised pt to resume stretches atleast 2x's per day with clasped hands flexion, wall slides, stargazer and to perform MLD daily   ASSESSMENT:   CLINICAL IMPRESSION: Continued with trigger point release along Rt lateral border of scapula in supine with Rt shoulder in varying end P/ROMs. Also cont in Lt S/L with M=STM to medial scap border and UT where trigger points also palpable. This did seem some improved today and pt reports upon waking yesterday morning she could tell her Rt upper quadrant felt looser than it had been feeling. Will reassess her when she returns from her trip in 2 weeks.    OBJECTIVE IMPAIRMENTS decreased activity tolerance, decreased ROM, increased edema, impaired flexibility, impaired UE functional use, postural dysfunction, and pain.    ACTIVITY LIMITATIONS    avoids heavier lifting, difficulty reaching to extremes on right   PARTICIPATION LIMITATIONS:  none just avoids doing certain things   PERSONAL FACTORS  Right breast CA s/p mastectomy, chemotherapy and radiation , diabetes are also affecting patient's functional outcome.    REHAB POTENTIAL: Excellent   CLINICAL DECISION MAKING: Stable/uncomplicated   EVALUATION COMPLEXITY: Low   GOALS: Goals reviewed with patient? Yes         SHORT TERM GOALS =LONG TERM GOALS: Target date: 07/20/2022     Pt will improve right shoulder flexion to WNL Baseline: see measurements at eval Goal status: INITIAL   2.  Pt will be independent with self MLD to prevent/treat lymphedema Baseline:  Goal status: INITIAL   3.  Pt will report decreased achiness in right UE by 50% or greater Baseline: 4/10 constant Goal status: INITIAL   4.  Pts SOZO screen will return  to normal range  Baseline: 0, last measured 8.9 Goal status: INITIAL     PLAN: PT FREQUENCY: 2x/week   PT DURATION: 6 weeks   PLANNED INTERVENTIONS: Therapeutic exercises, Therapeutic activity, Neuromuscular re-education, Patient/Family education, Joint mobilization, Orthotic/Fit training, Manual lymph drainage, Vasopneumatic device, and Manual therapy   PLAN FOR NEXT SESSION: Pt on hold x2 weeks due to vacation; when she returns will need a renewal; reassess tightness at Rt lateral trunk and reassess goals; cont PROM right shoulder, STM to Rt upper quadrant as done today, review self right UE MLD prn, update HEP prn   Collie Siad, PTA 07/19/22 11:55 AM

## 2022-07-25 ENCOUNTER — Telehealth: Payer: Self-pay | Admitting: *Deleted

## 2022-07-25 ENCOUNTER — Other Ambulatory Visit (HOSPITAL_COMMUNITY): Payer: Self-pay

## 2022-07-25 NOTE — Telephone Encounter (Signed)
RN attempt x1 to contact pt regarding [employer] testing and PA to inform pt that our office does not complete the PA process and that [facility] handles all of that.  [employer] has to run testing with pt insurance company first for payment and if denied, Allison Reed will cover the full cost.  LVM for pt to return call to the office.

## 2022-07-26 ENCOUNTER — Encounter: Payer: Self-pay | Admitting: Hematology and Oncology

## 2022-07-31 ENCOUNTER — Ambulatory Visit: Payer: 59

## 2022-07-31 DIAGNOSIS — C50811 Malignant neoplasm of overlapping sites of right female breast: Secondary | ICD-10-CM

## 2022-07-31 DIAGNOSIS — R293 Abnormal posture: Secondary | ICD-10-CM

## 2022-07-31 DIAGNOSIS — M25611 Stiffness of right shoulder, not elsewhere classified: Secondary | ICD-10-CM

## 2022-07-31 NOTE — Addendum Note (Signed)
Addended by: Claris Pong on: 07/31/2022 12:07 PM   Modules accepted: Orders

## 2022-07-31 NOTE — Therapy (Addendum)
OUTPATIENT PHYSICAL THERAPY TREATMENT NOTE   Patient Name: Allison Reed MRN: [id number] DOB:06/23/1976, 46 y.o., female, female Today's Date: 07/31/2022  PCP: Stephen South MD REFERRING PROVIDER: Gudena, Vinay, MD  END OF SESSION:   PT End of Session - 07/31/22 0804     Visit Number 8    Number of Visits 12    Date for PT Re-Evaluation 08/28/22    PT Start Time 0801    PT Stop Time 0849   Pt requested ending early due to increased soreness   PT Time Calculation (min) 48 min    Activity Tolerance Patient tolerated treatment well    Behavior During Therapy WFL for tasks assessed/performed             Past Medical History:  Diagnosis Date   Diabetes mellitus without complication (HCC)    Type 2   Family history of brain tumor    Family history of skin cancer    Gestational diabetes    Headache    otc med prn   Heartburn in pregnancy    Incompetent cervix in pregnancy 04/2015   Missed abortion    no surgery required   Postpartum care following cesarean delivery (11/2) 10/12/2015   right breast ca 06/2021   UTI in pregnancy 03/2015   recent UTI, treatment completed   Past Surgical History:  Procedure Laterality Date   APPENDECTOMY  2006   BREAST RECONSTRUCTION WITH PLACEMENT OF TISSUE EXPANDER AND ALLODERM Right 07/25/2021   Procedure: RIGHT BREAST RECONSTRUCTION WITH PLACEMENT OF TISSUE EXPANDER AND ALLODERM;  Surgeon: Thimmappa, Brinda, MD;  Location: [facility];  Service: Plastics;  Laterality: Right;   CERCLAGE LAPAROSCOPIC ABDOMINAL N/A 04/15/2015   Procedure: LAPAROSCOPIC TRANSABDOMINAL CERVCOISTHMIC CERCLAGE;  Surgeon: Tamer Yalcinkaya, MD;  Location: [facility];  Service: Gynecology;  Laterality: N/A;   CERVICAL CERCLAGE  02/2011   vaginal   CESAREAN SECTION N/A 10/12/2015   Procedure: CESAREAN SECTION;  Surgeon: Marie-Lyne Lavoie, MD;  Location: [facility];  Service: Obstetrics;  Laterality: N/A;   CESAREAN SECTION N/A 03/03/2018   Procedure: Repeat CESAREAN  SECTION/Removal of Abdominal Cerclage;  Surgeon: Mody, Vaishali, MD;  Location: [facility];  Service: Obstetrics;  Laterality: N/A;  EDD: 03/28/18   MASTECTOMY W/ SENTINEL NODE BIOPSY Right 07/25/2021   Procedure: RIGHT MASTECTOMY WITH AXILLARY SENTINEL LYMPH NODE BIOPSY;  Surgeon: Wakefield, Matthew, MD;  Location: [facility];  Service: General;  Laterality: Right;   PORTACATH PLACEMENT N/A 08/24/2021   Procedure: INSERTION PORT-A-CATH;  Surgeon: Wakefield, Matthew, MD;  Location:  [facility];  Service: General;  Laterality: N/A;   RADIOACTIVE SEED GUIDED AXILLARY SENTINEL LYMPH NODE Right 07/25/2021   Procedure: RADIOACTIVE SEED GUIDED RIGHT AXILLARY NODE EXCISION;  Surgeon: Wakefield, Matthew, MD;  Location:  SUR[facility]ERY C[facility];  Service: General;  Laterality: Right;   TISSUE EXPANDER PLACEMENT Right 12/04/2021   Procedure: REMOVAL RIGHT CHEST TISSUE EXPANDER;  Surgeon: Thimmappa, Brinda, MD;  Location: [facility];  Service: Plastics;  Laterality: Right;   WISDOM TOOTH EXTRACTION  2001   Patient Active Problem List   Diagnosis Date Noted   Lower leg mass, left 05/02/2022   Port-A-Cath in place 08/25/2021   S/P mastectomy, right 07/25/2021   Genetic testing 07/01/2021   Family history of skin cancer 06/22/2021   Family history of brain tumor 06/22/2021   Malignant neoplasm of overlapping sites of right breast in female, estrogen receptor positive (HCC) 06/21/2021   Status post repeat low transverse cesarean section   03/03/2018   DM (diabetes mellitus) in pregnancy 03/02/2018   Incompetent cervix in pregnancy 03/02/2018    REFERRING DIAG: Right Breast Cancer  THERAPY DIAG:  Malignant neoplasm of overlapping sites of right breast in female, estrogen receptor positive (HCC)  Stiffness of right shoulder, not elsewhere classified  Abnormal posture  Rationale for Evaluation and Treatment Rehabilitation  PERTINENT HISTORY: 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 She noted a palpable right breast mass. This is followed by a mammogram with a mass with calcifications as really throughout the upper outer quadrant and in the central breast. This measures 9.4 x 8.7 x 6.7 cm. On ultrasound she had 3 separate masses at 10, 11, and 12:00. The sizes are 1.7, 2.1, and 2.2 cm. There is also a single abnormal axillary lymph node that has undergone biopsy. 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 She is s/p right breast mastectomy with SLNB 0/7 LN on August 16 andl also had tissue expanders in place.  She is s/p chemotherapy for 6 months every other week and had radiation following ; She had emergency sugery to remove expander 12/04/21; completed radiation 04/04/22   PRECAUTIONS: Other: lymphedema risk Right UE  SUBJECTIVE:  I can tell the upper trap tightness is much better. I found one cord yesterday and my Rt lateral trunk feels more tight since I got back. I'd like to come a few more times to get rid of the last bit of tightness in my end Rt shoulder A/ROM.  PAIN:  Are you having pain? No Just reports tightness at Rt lateral trunk inferior to axilla   OBJECTIVE: (objective measures completed at initial evaluation unless otherwise dated)   PALPATION: Tightness in right axilla and posterior shoulder along scapular muscles    OBSERVATIONS / OTHER ASSESSMENTS: no visible cording, right UE is visually larger but she is right handed. No pitting edema   SENSATION:            Light touch: Deficits lateral chest            POSTURE: rounded shoulders   UPPER EXTREMITY AROM/PROM:   A/PROM RIGHT   eval 06/08/2022  07/31/22  Shoulder extension 45 50  Shoulder flexion 145 148  Shoulder abduction 129 164  Shoulder internal rotation     Shoulder external rotation 95                           (Blank rows = not tested)   A/PROM LEFT   eval  Shoulder extension 45  Shoulder flexion 155  Shoulder abduction 176  Shoulder internal rotation    Shoulder external rotation 95                          (Blank rows = not tested)     CERVICAL AROM: All within normal limits:          UPPER EXTREMITY STRENGTH: WNL   LYMPHEDEMA ASSESSMENTS:    SURGERY TYPE/DATE: Right breast mastectomy with SLNB 07/25/2021 with reconstruction, Emergency removal of expander 12/04/2021   NUMBER OF LYMPH NODES REMOVED:   4/7   CHEMOTHERAPY: yes   RADIATION:yes   HORMONE TREATMENT: yes   INFECTIONS: yes   LYMPHEDEMA ASSESSMENTS:    LANDMARK RIGHT  eval  10 cm proximal to olecranon process 26.4  Olecranon process 25.2  10 cm proximal to ulnar styloid process 21.0  Just proximal to ulnar styloid process 15.5  Across hand at thumb web space 19.0  At base of 2nd digit 5.8  (Blank rows = not tested)   LANDMARK LEFT  eval  10 cm proximal to olecranon process 24.8  Olecranon process 24.0  10 cm proximal to ulnar styloid process 19.8  Just proximal to ulnar styloid process 15.3  Across hand at thumb web space 18.6  At base of 2nd digit 5.9  (Blank rows = not tested)           L-DEX LYMPHEDEMA SCREENING:   The patient was assessed using the L-Dex machine today to produce a lymphedema index baseline score. The patient will be reassessed on a regular basis (typically every 3 months) to obtain new L-Dex scores. If the score is > 6.5 points away from his/her baseline score indicating onset of subclinical lymphedema, it will be recommended to wear a compression garment for 4 weeks, 12 hours per day and then be reassessed. If the score continues to be > 6.5 points from baseline at reassessment, we will initiate  lymphedema treatment. Assessing in this manner has a 95% rate of preventing clinically significant lymphedema.          QUICK DASH SURVEY: not completed     TODAY'S TREATMENT:   07/31/22: Manual Therapy STM: In supine and then Lt S/L to Rt lateral trunk/lateral scapula border where tightness that is still tender, was palpable but no trigger points today; also in Lt S/L to medial scapular border and upper trap with cocoa butter where trigger points palpable but much less so today Scap Mobs: Lt S/L into protraction and retraction of Rt scapula  07/19/22: Manual Therapy STM: In supine and then Lt S/L with cocoa butter to Rt lateral trunk/lateral scapula border where deep trigger points palpable today that were still tender for pt but some improved from last session with less "pinching" feeling reported; also in Lt S/L to medial scapular border and upper trap where trigger points palpable Scap Mobs: Lt S/L into protraction and retraction of Rt scapula  07/17/22: Manual Therapy STM: In supine and then Lt S/L with cocoa butter to Rt lateral trunk/lateral scapula border where deep trigger points palpable today that were tender for pt; also in Lt S/L to medial scapular border and upper trap where trigger points palpable MLD: SCF, 5 diaphragmatic breaths, Rt  inguinal nodes, Rt axillo-inguinal anastomosis and focused on fullness noted at lateral trunk. Scap Mobs: Lt S/L into protraction and retraction of Rt scapula     PATIENT EDUCATION:  Education details: MLD to right UE Person educated: Patient Education method: Consulting civil engineer, Demonstration, and Handouts Education comprehension: verbalized understanding, returned demonstration, verbal cues required, tactile cues required, and needs further education     HOME EXERCISE PROGRAM: Advised pt to resume stretches atleast 2x's per day with clasped hands flexion, wall slides, stargazer and to perform MLD daily   ASSESSMENT:   CLINICAL  IMPRESSION: Pt returns after flight to [facility] reporting increased Rt lateral trunk tightness. Her Rt shoulder flexion is also essentially the same as when last measured on 6/30. Her abduction, however, has greatly improved as her reports of decreased achiness in Rt upper arm.  Pt would like to return for 1-3 more visits to decrease residual tightness limiting her end Rt shoulder flexion that had improved since before trip. Ended session a little early today due to pt reporting increased achiness with the prolonged stretching.    OBJECTIVE IMPAIRMENTS decreased activity tolerance, decreased ROM, increased edema, impaired flexibility, impaired UE functional use, postural dysfunction, and pain.    ACTIVITY LIMITATIONS    avoids heavier lifting, difficulty reaching to extremes on right   PARTICIPATION LIMITATIONS:  none just avoids doing certain things   PERSONAL FACTORS  Right breast CA s/p mastectomy, chemotherapy and radiation , diabetes are also affecting patient's functional outcome.    REHAB POTENTIAL: Excellent   CLINICAL DECISION MAKING: Stable/uncomplicated   EVALUATION COMPLEXITY: Low   GOALS: Goals reviewed with patient? Yes         SHORT TERM GOALS =LONG TERM GOALS: Target date: 07/20/2022     Pt will improve right shoulder flexion to WNL Baseline: 145 degrees - 6/30; 148 degrees (increased tightness reported since flight)- 07/31/22 Goal status: ONGOING   2.  Pt will be independent with self MLD to prevent/treat lymphedema Baseline:  Goal status: MET   3.  Pt will report decreased achiness in right UE by 50% or greater Baseline: 4/10 constant; 80-90% improved at this time - 07/31/22 Goal status: MET   4.  Pts SOZO screen will return to normal range  Baseline: 0, last measured 8.9; 1.1 - 07/11/22 Goal status: MET     PLAN: PT FREQUENCY: 2x/week   PT DURATION: 6 weeks   PLANNED INTERVENTIONS: Therapeutic exercises, Therapeutic activity, Neuromuscular re-education,  Patient/Family education, Joint mobilization, Orthotic/Fit training, Manual lymph drainage, Vasopneumatic device, and Manual therapy   PLAN FOR NEXT SESSION: Renewal done this session for 1-3 more visits. Cont manual therapy as done today   Collie Siad, PTA 07/31/22 9:06 AM

## 2022-08-02 ENCOUNTER — Ambulatory Visit: Payer: 59

## 2022-08-02 DIAGNOSIS — M25611 Stiffness of right shoulder, not elsewhere classified: Secondary | ICD-10-CM

## 2022-08-02 DIAGNOSIS — C50811 Malignant neoplasm of overlapping sites of right female breast: Secondary | ICD-10-CM

## 2022-08-02 DIAGNOSIS — R293 Abnormal posture: Secondary | ICD-10-CM

## 2022-08-02 NOTE — Therapy (Signed)
OUTPATIENT PHYSICAL THERAPY TREATMENT NOTE   Patient Name: Allison Reed MRN: [id number] DOB:October 24, 1976, 46 y.o., female Today's Date: 08/02/2022  PCP: [name] MD REFERRING PROVIDER: [name], MD  END OF SESSION:   PT End of Session - 08/02/22 0811     Visit Number 9    Number of Visits 12    Date for PT Re-Evaluation 08/28/22    PT Start Time 0805    PT Stop Time 9983    PT Time Calculation (min) 39 min    Activity Tolerance Patient tolerated treatment well    Behavior During Therapy [facility] for tasks assessed/performed             Past Medical History:  Diagnosis Date   Diabetes mellitus without complication ([place])    Type 2   Family history of brain tumor    Family history of skin cancer    Gestational diabetes    Headache    otc med prn   Heartburn in pregnancy    Incompetent cervix in pregnancy 04/2015   Missed abortion    no surgery required   Postpartum care following cesarean delivery (11/2) 10/12/2015   right breast ca 06/2021   UTI in pregnancy 03/2015   recent UTI, treatment completed   Past Surgical History:  Procedure Laterality Date   APPENDECTOMY  2006   BREAST RECONSTRUCTION WITH PLACEMENT OF TISSUE EXPANDER AND ALLODERM Right 07/25/2021   Procedure: RIGHT BREAST RECONSTRUCTION WITH PLACEMENT OF TISSUE EXPANDER AND ALLODERM;  Surgeon: Irene Limbo, MD;  Location: [facility];  Service: Plastics;  Laterality: Right;   CERCLAGE LAPAROSCOPIC ABDOMINAL N/A 04/15/2015   Procedure: LAPAROSCOPIC TRANSABDOMINAL CERVCOISTHMIC CERCLAGE;  Surgeon: Governor Specking, MD;  Location: [facility];  Service: Gynecology;  Laterality: N/A;   CERVICAL CERCLAGE  02/2011   vaginal   CESAREAN SECTION N/A 10/12/2015   Procedure: CESAREAN SECTION;  Surgeon: Princess Bruins, MD;  Location: [facility];  Service: Obstetrics;  Laterality: N/A;   CESAREAN SECTION N/A 03/03/2018   Procedure: Repeat CESAREAN SECTION/Removal of Abdominal Cerclage;  Surgeon: Azucena Fallen, MD;  Location: [place];  Service: Obstetrics;  Laterality: N/A;  EDD: 03/28/18   MASTECTOMY W/ SENTINEL NODE BIOPSY Right 07/25/2021   Procedure: RIGHT MASTECTOMY WITH AXILLARY SENTINEL LYMPH NODE BIOPSY;  Surgeon: [name], MD;  Location: [place];  Service: General;  Laterality: Right;   PORTACATH PLACEMENT N/A 08/24/2021   Procedure: INSERTION PORT-A-CATH;  Surgeon: [name], MD;  Location: [facility];  Service: General;  Laterality: N/A;   RADIOACTIVE SEED GUIDED AXILLARY SENTINEL LYMPH NODE Right 07/25/2021   Procedure: RADIOACTIVE SEED GUIDED RIGHT AXILLARY NODE EXCISION;  Surgeon: [name], MD;  Location: [facility];  Service: General;  Laterality: Right;   TISSUE EXPANDER PLACEMENT Right 12/04/2021   Procedure: REMOVAL RIGHT CHEST TISSUE EXPANDER;  Surgeon: Irene Limbo, MD;  Location: [place];  Service: Plastics;  Laterality: Right;   WISDOM TOOTH EXTRACTION  2001   Patient Active Problem List   Diagnosis Date Noted   Lower leg mass, left 05/02/2022   Port-A-Cath in place 08/25/2021   S/P mastectomy, right 07/25/2021   Genetic testing 07/01/2021   Family history of skin cancer 06/22/2021   Family history of brain tumor 06/22/2021   Malignant neoplasm of overlapping sites of right breast in female, estrogen receptor positive ([place]) 06/21/2021   Status post repeat low transverse cesarean section 03/03/2018   DM (diabetes mellitus) in pregnancy 03/02/2018  Incompetent cervix in pregnancy 03/02/2018    REFERRING DIAG: Right Breast Cancer  THERAPY DIAG:  Malignant neoplasm of overlapping sites of right breast in female, estrogen receptor positive ([place])  Stiffness of right shoulder, not elsewhere classified  Abnormal posture  Rationale for Evaluation and Treatment Rehabilitation  PERTINENT HISTORY: 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 She noted a palpable right  breast mass. This is followed by a mammogram with a mass with calcifications as really throughout the upper outer quadrant and in the central breast. This measures 9.4 x 8.7 x 6.7 cm. On ultrasound she had 3 separate masses at 10, 11, and 12:00. The sizes are 1.7, 2.1, and 2.2 cm. There is also a single abnormal axillary lymph node that has undergone biopsy. 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 She is s/p right breast mastectomy with SLNB 0/7 LN on August 16 andl also had tissue expanders in place.  She is s/p chemotherapy for 6 months every other week and had radiation following ; She had emergency sugery to remove expander 12/04/21; completed radiation 04/04/22   PRECAUTIONS: Other: lymphedema risk Right UE  SUBJECTIVE:  Overall my cording symptoms are much better. I can still feel the tightness at my Rt latera trunk but that is getting better as well. I was able to sleep with my arms over my head last night. Can you show me a few other stretches for that area?  PAIN:  Are you having pain? No Just reports tightness at Rt lateral trunk inferior to axilla   OBJECTIVE: (objective measures completed at initial evaluation unless otherwise dated)   PALPATION: Tightness in right axilla and posterior shoulder along scapular muscles    OBSERVATIONS / OTHER ASSESSMENTS: no visible cording, right UE is visually larger but she is right handed. No pitting edema   SENSATION:            Light touch: Deficits lateral chest            POSTURE: rounded shoulders   UPPER EXTREMITY AROM/PROM:   A/PROM RIGHT   eval 06/08/2022 07/31/22  Shoulder extension 45 50  Shoulder flexion 145 148   Shoulder abduction 129 164  Shoulder internal rotation     Shoulder external rotation 95                           (Blank rows = not tested)   A/PROM LEFT   eval  Shoulder extension 45  Shoulder flexion 155  Shoulder abduction 176  Shoulder internal rotation    Shoulder external rotation 95                          (Blank rows = not tested)     CERVICAL AROM: All within normal limits:          UPPER EXTREMITY STRENGTH: WNL   LYMPHEDEMA ASSESSMENTS:    SURGERY TYPE/DATE: Right breast mastectomy with SLNB 07/25/2021 with reconstruction, Emergency removal of expander 12/04/2021   NUMBER OF LYMPH NODES REMOVED: 4/7   CHEMOTHERAPY: yes   RADIATION:yes   HORMONE TREATMENT:  yes   INFECTIONS: yes   LYMPHEDEMA ASSESSMENTS:    LANDMARK RIGHT  eval  10 cm proximal to olecranon process 26.4  Olecranon process 25.2  10 cm proximal to ulnar styloid process 21.0  Just proximal to ulnar styloid process 15.5  Across hand at thumb web space 19.0  At base of 2nd digit 5.8  (Blank rows = not tested)   LANDMARK LEFT  eval  10 cm proximal to olecranon process 24.8  Olecranon process 24.0  10 cm proximal to ulnar styloid process 19.8  Just proximal to ulnar styloid process 15.3  Across hand at thumb web space 18.6  At base of 2nd digit 5.9  (Blank rows = not tested)           L-DEX LYMPHEDEMA SCREENING:   The patient was assessed using the L-Dex machine today to produce a lymphedema index baseline score. The patient will be reassessed on a regular basis (typically every 3 months) to obtain new L-Dex scores. If the score is > 6.5 points away from his/her baseline score indicating onset of subclinical lymphedema, it will be recommended to wear a compression garment for 4 weeks, 12 hours per day and then be reassessed. If the score continues to be > 6.5 points from baseline at reassessment, we will initiate lymphedema treatment. Assessing in this manner has a 95% rate of  preventing clinically significant lymphedema.          QUICK DASH SURVEY: not completed     TODAY'S TREATMENT:   08/02/22: Therapeutic Exercises Demonstrated Rt lateral trunk stretches for pt to return demo as follows: Lt S/L over bolster for Rt UE OH reach trying varying positions on bolster, bil OH reach with fingers clasped and adding Lt lateral trunk lean with thoracic rotation and then demonstrated to pt doorway end motion stretch with side trunk lean. Pt reported feeling good stretch at last remaining area of tightness at her Rt lateral trunk with each stretch.  Manual Therapy STM: In supine and then Lt S/L to Rt lateral trunk/lateral scapula border where tightness that is still tender but improving, tightness was some softened by end of session.  P/ROM: End Rt shoulder abd and flex stretches for STM. Pt with near full motions Scap Mobs: Lt S/L into protraction and retraction of Rt scapula briefly  07/31/22: Manual Therapy STM: In supine and then Lt S/L to Rt lateral trunk/lateral scapula border where tightness that is still tender, was palpable but no trigger points today; also in Lt S/L to medial scapular border and upper trap with cocoa butter where trigger points palpable but much less so today Scap Mobs: Lt S/L into protraction and retraction of Rt scapula  07/19/22: Manual Therapy STM: In supine and then Lt S/L with cocoa butter to Rt lateral trunk/lateral scapula border where deep trigger points palpable today that were still tender for pt but some improved from last session with less "pinching" feeling reported; also in Lt S/L to medial scapular border and upper trap where trigger points palpable Scap Mobs: Lt S/L into protraction and retraction of Rt scapula      PATIENT EDUCATION:  Education details: MLD to right UE Person educated: Patient Education method: Consulting civil engineer, Demonstration, and Handouts Education comprehension: verbalized understanding, returned  demonstration, verbal cues required, tactile cues required, and needs further education     HOME EXERCISE PROGRAM: Advised pt to resume stretches atleast 2x's per day with clasped hands flexion, wall slides, stargazer and to perform MLD daily  ASSESSMENT:   CLINICAL IMPRESSION: Pt overall is doing very well with her progress reporting not feeling cording symptoms any longer down into her Rt UE. Only main complaint now is residual tightness at Rt latera trunk. New stretches were instructed to pt and then continued manual therapy to Rt lateral trunk area. Pt would like to return one more time in 2-3 weeks for probable final assess.    OBJECTIVE IMPAIRMENTS decreased activity tolerance, decreased ROM, increased edema, impaired flexibility, impaired UE functional use, postural dysfunction, and pain.    ACTIVITY LIMITATIONS    avoids heavier lifting, difficulty reaching to extremes on right   PARTICIPATION LIMITATIONS:  none just avoids doing certain things   PERSONAL FACTORS  Right breast CA s/p mastectomy, chemotherapy and radiation , diabetes are also affecting patient's functional outcome.    REHAB POTENTIAL: Excellent   CLINICAL DECISION MAKING: Stable/uncomplicated   EVALUATION COMPLEXITY: Low   GOALS: Goals reviewed with patient? Yes         SHORT TERM GOALS =LONG TERM GOALS: Target date: 07/20/2022     Pt will improve right shoulder flexion to WNL Baseline: 145 degrees - 6/30; 148 degrees (increased tightness reported since flight)- 07/31/22 Goal status: ONGOING   2.  Pt will be independent with self MLD to prevent/treat lymphedema Baseline:  Goal status: MET   3.  Pt will report decreased achiness in right UE by 50% or greater Baseline: 4/10 constant; 80-90% improved at this time - 07/31/22 Goal status: MET   4.  Pts SOZO screen will return to normal range  Baseline: 0, last measured 8.9; 1.1 - 07/11/22 Goal status: MET     PLAN: PT FREQUENCY: 2x/week   PT  DURATION: 6 weeks   PLANNED INTERVENTIONS: Therapeutic exercises, Therapeutic activity, Neuromuscular re-education, Patient/Family education, Joint mobilization, Orthotic/Fit training, Manual lymph drainage, Vasopneumatic device, and Manual therapy   PLAN FOR NEXT SESSION: Renewal done this session for 1-3 more visits. Cont manual therapy as done today; pt to return in 2-3 weeks for probable final assess   Collie Siad, PTA 08/02/22 8:59 AM

## 2022-08-06 ENCOUNTER — Ambulatory Visit: Payer: 59

## 2022-08-14 ENCOUNTER — Inpatient Hospital Stay: Payer: 59 | Attending: Hematology and Oncology

## 2022-08-14 VITALS — BP 97/76 | HR 88 | Temp 98.7°F | Resp 18

## 2022-08-14 DIAGNOSIS — Z170 Estrogen receptor positive status [ER+]: Secondary | ICD-10-CM | POA: Insufficient documentation

## 2022-08-14 DIAGNOSIS — Z79811 Long term (current) use of aromatase inhibitors: Secondary | ICD-10-CM | POA: Diagnosis not present

## 2022-08-14 DIAGNOSIS — Z9011 Acquired absence of right breast and nipple: Secondary | ICD-10-CM | POA: Diagnosis not present

## 2022-08-14 DIAGNOSIS — C50811 Malignant neoplasm of overlapping sites of right female breast: Secondary | ICD-10-CM | POA: Insufficient documentation

## 2022-08-14 DIAGNOSIS — Z5111 Encounter for antineoplastic chemotherapy: Secondary | ICD-10-CM | POA: Diagnosis not present

## 2022-08-14 MED ORDER — GOSERELIN ACETATE 3.6 MG ~~LOC~~ IMPL
3.6000 mg | DRUG_IMPLANT | SUBCUTANEOUS | Status: DC
  Administered 2022-08-14: 3.6 mg via SUBCUTANEOUS

## 2022-08-23 ENCOUNTER — Ambulatory Visit: Payer: 59

## 2022-09-05 ENCOUNTER — Ambulatory Visit: Payer: 59

## 2022-09-09 NOTE — Progress Notes (Signed)
Patient Care Team: [name], MD as PCP - General (Endocrinology) Eppie Gibson, MD as Attending Physician (Radiation Oncology) Irene Limbo, MD as Consulting Physician (Plastic Surgery) [name], MD as Consulting Physician (Hematology and Oncology) [name], MD as Consulting Physician (General Surgery)  DIAGNOSIS: No diagnosis found.  SUMMARY OF ONCOLOGIC HISTORY: Oncology History  Malignant neoplasm of overlapping sites of right breast in female, estrogen receptor positive ([place])  06/16/2021 Initial Diagnosis   Palpable right breast mass: Breast MRI revealed extensive involvement of the right breast with mass and non-mass enhancement superior right breast mass measures 4.9 x 1.2 cm and together with non-mass enhancement measured 8.8 cm, enhancing mass LOQ 1.3 cm, left breast indeterminate 0.9 cm mass and a 0.8 cm mass UOQ, single abnormal lymph node Biopsy 10:00, 11:00 and 12:00: IDC with DCIS, biopsy right axillary lymph node: IDC, ER 75 to 95%, PR 90 to 95%, Ki-67 25%, HER2 negative on 1 biopsy and 2+ by IHC and FISH pending   06/21/2021 Cancer Staging   Staging form: Breast, AJCC 8th Edition - Clinical stage from 06/21/2021: Stage IIA (cT3, cN1, cM0, G2, ER+, PR+, HER2-) - Signed by [name], MD on 06/21/2021 Histologic grading system: 3 grade system   07/01/2021 Genetic Testing   Negative genetic testing:  No pathogenic variants detected on the [facility] BRCAplus panel (report date 07/01/2021) or the [employer] CancerNext-Expanded + RNAinsight panel (report date 07/01/2021).   The BRCAplus panel offered by [employer] and includes sequencing and deletion/duplication analysis for the following 8 genes: ATM, BRCA1, BRCA2, CDH1, CHEK2, PALB2, PTEN, and TP53. The CancerNext-Expanded + RNAinsight gene panel offered by [employer] and includes sequencing and rearrangement analysis for the following 77 genes: AIP, ALK, APC, ATM, AXIN2, BAP1, BARD1, BLM, BMPR1A, BRCA1,  BRCA2, BRIP1, CDC73, CDH1, CDK4, CDKN1B, CDKN2A, CHEK2, CTNNA1, DICER1, FANCC, FH, FLCN, GALNT12, KIF1B, LZTR1, MAX, MEN1, MET, MLH1, MSH2, MSH3, MSH6, MUTYH, NBN, NF1, NF2, NTHL1, PALB2, PHOX2B, PMS2, POT1, PRKAR1A, PTCH1, PTEN, RAD51C, RAD51D, RB1, RECQL, RET, SDHA, SDHAF2, SDHB, SDHC, SDHD, SMAD4, SMARCA4, SMARCB1, SMARCE1, STK11, SUFU, TMEM127, TP53, TSC1, TSC2, VHL and XRCC2 (sequencing and deletion/duplication); EGFR, EGLN1, HOXB13, KIT, MITF, PDGFRA, POLD1 and POLE (sequencing only); EPCAM and GREM1 (deletion/duplication only). RNA data is routinely analyzed for use in variant interpretation for all genes.   07/25/2021 Surgery   Right mastectomy: Foci of invasive ductal carcinoma measuring 1.1 cm grade 2 with extensive DCIS spanning 8.9 cm, margins negative, 4/7 lymph nodes, ER 75 to 95%, PR 9095%, HER2 negative, Ki-67 25%   08/25/2021 - 01/19/2022 Adjuvant Chemotherapy   Adjuvant chemotherapy: dose dense AC x 4 followed by weekly Taxol x 12.    02/26/2022 - 04/04/2022 Radiation Therapy   Site Technique Total Dose (Gy) Dose per Fx (Gy) Completed Fx Beam Energies  Chest Wall, Right: CW_R_IMN 3D 50.4/50.4 1.8 28/28 6X  Chest Wall, Right: CW_R_PAB_SCV 3D 50.4/50.4 1.8 28/28 6X, 10X     03/14/2022 -  Anti-estrogen oral therapy   Zoladex.  Letrozole started 07/2022.       CHIEF COMPLIANT: Follow-up to discuss Verzenio, history of right breast cancer   INTERVAL HISTORY: Allison Reed is a 46 y.o. with the above mentioned. She presents to the clinic for a follow-up to discuss Verzenio.    ALLERGIES:  is allergic to betadine [povidone iodine] and tape.  MEDICATIONS:  Current Outpatient Medications  Medication Sig Dispense Refill   Empagliflozin-metFORMIN HCl ER (SYNJARDY XR) 09-999 MG TB24 Take by mouth.  ergocalciferol (VITAMIN D2) 1.25 MG (50000 UT) capsule Take 1 capsule (50,000 Units total) by mouth once a week. 12 capsule 0   letrozole (FEMARA) 2.5 MG tablet Take 1 tablet (2.5 mg  total) by mouth daily. 90 tablet 3   Multiple Vitamins-Minerals (WOMENS DAILY FORMULA PO) Take by mouth daily.     ONETOUCH VERIO test strip 1 each by Other route 2 (two) times daily.     TRADJENTA 5 MG TABS tablet Take 5 mg by mouth daily.     No current facility-administered medications for this visit.    PHYSICAL EXAMINATION: ECOG PERFORMANCE STATUS: {CHL ONC ECOG PS:[phone]}  There were no vitals filed for this visit. There were no vitals filed for this visit.  BREAST:*** No palpable masses or nodules in either right or left breasts. No palpable axillary supraclavicular or infraclavicular adenopathy no breast tenderness or nipple discharge. (exam performed in the presence of a chaperone)  LABORATORY DATA:  I have reviewed the data as listed    Latest Ref Rng & Units 07/16/2022    3:24 PM 03/01/2022   10:27 AM 01/19/2022    8:26 AM  CMP  Glucose 70 - 99 mg/dL 255  168  219   BUN 6 - 20 mg/dL _0 Creatinine 0.44 - 1.00 mg/dL 0.57  0.55  0.47   Sodium 135 - 145 mmol/L 137  138  137   Potassium 3.5 - 5.1 mmol/L 4.1  4.1  3.8   Chloride 98 - 111 mmol/L 100  101  103   CO2 22 - 32 mmol/L _1 Calcium 8.9 - 10.3 mg/dL 10.2  9.7  9.3   Total Protein 6.5 - 8.1 g/dL 7.5  6.9  6.6   Total Bilirubin 0.3 - 1.2 mg/dL 0.4  0.5  0.4   Alkaline Phos 38 - 126 U/L 73  63  58   AST 15 - 41 U/L _2 ALT 0 - 44 U/L _3 Lab Results  Component Value Date   WBC 4.1 07/16/2022   HGB 15.0 07/16/2022   HCT 44.0 07/16/2022   MCV 77.7 (L) 07/16/2022   PLT 213 07/16/2022   NEUTROABS 2.2 07/16/2022    ASSESSMENT & PLAN:  No problem-specific Assessment & Plan notes found for this encounter.    No orders of the defined types were placed in this encounter.  The patient has a good understanding of the overall plan. she agrees with it. she will call with any problems that may develop before the next visit here. Total time spent: 30 mins including face to  face time and time spent for planning, charting and co-ordination of care   Suzzette Righter, [place] 09/09/22    I Gardiner Coins am scribing for Dr. Lindi Adie  ***

## 2022-09-10 ENCOUNTER — Encounter (HOSPITAL_COMMUNITY): Payer: Self-pay

## 2022-09-10 ENCOUNTER — Telehealth: Payer: Self-pay | Admitting: *Deleted

## 2022-09-10 ENCOUNTER — Other Ambulatory Visit: Payer: Self-pay | Admitting: *Deleted

## 2022-09-10 DIAGNOSIS — Z170 Estrogen receptor positive status [ER+]: Secondary | ICD-10-CM

## 2022-09-10 NOTE — Telephone Encounter (Signed)
Per MD request, RN placed call to pt regarding negative (Not Detected) recent [employer] testing.  No answer.  LVM for pt to return call to office to review results.

## 2022-09-11 ENCOUNTER — Inpatient Hospital Stay: Payer: 59 | Attending: Hematology and Oncology

## 2022-09-11 ENCOUNTER — Inpatient Hospital Stay: Payer: 59

## 2022-09-11 ENCOUNTER — Inpatient Hospital Stay (HOSPITAL_BASED_OUTPATIENT_CLINIC_OR_DEPARTMENT_OTHER): Payer: 59 | Admitting: Hematology and Oncology

## 2022-09-11 ENCOUNTER — Other Ambulatory Visit: Payer: Self-pay | Admitting: *Deleted

## 2022-09-11 ENCOUNTER — Encounter: Payer: Self-pay | Admitting: Hematology and Oncology

## 2022-09-11 DIAGNOSIS — Z923 Personal history of irradiation: Secondary | ICD-10-CM | POA: Insufficient documentation

## 2022-09-11 DIAGNOSIS — Z9011 Acquired absence of right breast and nipple: Secondary | ICD-10-CM | POA: Insufficient documentation

## 2022-09-11 DIAGNOSIS — Z170 Estrogen receptor positive status [ER+]: Secondary | ICD-10-CM | POA: Diagnosis not present

## 2022-09-11 DIAGNOSIS — C50811 Malignant neoplasm of overlapping sites of right female breast: Secondary | ICD-10-CM | POA: Insufficient documentation

## 2022-09-11 DIAGNOSIS — Z5111 Encounter for antineoplastic chemotherapy: Secondary | ICD-10-CM | POA: Diagnosis not present

## 2022-09-11 DIAGNOSIS — Z9221 Personal history of antineoplastic chemotherapy: Secondary | ICD-10-CM | POA: Insufficient documentation

## 2022-09-11 DIAGNOSIS — R5383 Other fatigue: Secondary | ICD-10-CM | POA: Diagnosis not present

## 2022-09-11 DIAGNOSIS — Z79811 Long term (current) use of aromatase inhibitors: Secondary | ICD-10-CM | POA: Insufficient documentation

## 2022-09-11 LAB — CMP (CANCER CENTER ONLY)
ALT: 22 U/L (ref 0–44)
AST: 21 U/L (ref 15–41)
Albumin: 4.6 g/dL (ref 3.5–5.0)
Alkaline Phosphatase: 63 U/L (ref 38–126)
Anion gap: 9 (ref 5–15)
BUN: 15 mg/dL (ref 6–20)
CO2: 29 mmol/L (ref 22–32)
Calcium: 9.9 mg/dL (ref 8.9–10.3)
Chloride: 99 mmol/L (ref 98–111)
Creatinine: 0.59 mg/dL (ref 0.44–1.00)
GFR, Estimated: >60 mL/min (ref >60)
Glucose, Bld: 239 mg/dL — ABNORMAL HIGH (ref 70–99)
Potassium: 3.9 mmol/L (ref 3.5–5.1)
Sodium: 137 mmol/L (ref 135–145)
Total Bilirubin: 0.4 mg/dL (ref 0.3–1.2)
Total Protein: 6.7 g/dL (ref 6.5–8.1)

## 2022-09-11 LAB — CBC WITH DIFFERENTIAL (CANCER CENTER ONLY)
Abs Immature Granulocytes: 0.01 10*3/uL (ref 0.00–0.07)
Basophils Absolute: 0 10*3/uL (ref 0.0–0.1)
Basophils Relative: 0 %
Eosinophils Absolute: 0.1 10*3/uL (ref 0.0–0.5)
Eosinophils Relative: 3 %
HCT: 41.7 % (ref 36.0–46.0)
Hemoglobin: 14.6 g/dL (ref 12.0–15.0)
Immature Granulocytes: 0 %
Lymphocytes Relative: 35 %
Lymphs Abs: 1.7 10*3/uL (ref 0.7–4.0)
MCH: 27 pg (ref 26.0–34.0)
MCHC: 35 g/dL (ref 30.0–36.0)
MCV: 77.1 fL — ABNORMAL LOW (ref 80.0–100.0)
Monocytes Absolute: 0.3 10*3/uL (ref 0.1–1.0)
Monocytes Relative: 7 %
Neutro Abs: 2.6 10*3/uL (ref 1.7–7.7)
Neutrophils Relative %: 55 %
Platelet Count: 202 10*3/uL (ref 150–400)
RBC: 5.41 MIL/uL — ABNORMAL HIGH (ref 3.87–5.11)
RDW: 14.1 % (ref 11.5–15.5)
WBC Count: 4.8 10*3/uL (ref 4.0–10.5)
nRBC: 0 % (ref 0.0–0.2)

## 2022-09-11 MED ORDER — GOSERELIN ACETATE 3.6 MG ~~LOC~~ IMPL
3.6000 mg | DRUG_IMPLANT | SUBCUTANEOUS | Status: DC
  Administered 2022-09-11: 3.6 mg via SUBCUTANEOUS
  Filled 2022-09-11: qty 3.6

## 2022-09-11 NOTE — Assessment & Plan Note (Signed)
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, margins negative, 4/7 lymph nodes, ER 75 to 95%, PR 9095%, HER2 negative, Ki-67 25% Genetics: Negative MammaPrint: Low risk (this is not a valid test for 4+ lymph nodes) Repeat prognostic panel on the lymph node in the final pathology also came back as ER/PR positive  Treatment plan: 1.Adjuvant chemotherapy with dose dense Adriamycin and Cytoxan x4 followed by Taxol weekly x12started 08/25/2021-01/19/2022 2.adjuvant radiation therapy3/21/2023-04/04/2022 3.Followed by adjuvant antiestrogen therapy with complete estrogen blockade and abemaciclib(discussed oophorectomy versus Zoladex plus letrozole plus abemaciclib) ------------------------------------------------------------------------------------------------------------------------ Recommendation:  Zoladex injections with letrozole  started June 2023  Monthly for Zoladex injections  Breast cancer surveillance: 1.  Breast examination 09/11/2022: Benign  2. left breast mammogram scheduled for 09/28/2022  Return to clinic in 1 year for follow-up

## 2022-09-13 ENCOUNTER — Telehealth: Payer: Self-pay | Admitting: Hematology and Oncology

## 2022-09-13 NOTE — Telephone Encounter (Signed)
Scheduled appointment per 10/3 los. Patient is aware.

## 2022-09-28 ENCOUNTER — Ambulatory Visit: Payer: 59

## 2022-10-04 ENCOUNTER — Ambulatory Visit
Admission: RE | Admit: 2022-10-04 | Discharge: 2022-10-04 | Disposition: A | Discharge status: Home / Self Care | Payer: 59 | Source: Ambulatory Visit | Attending: Adult Health | Admitting: Adult Health

## 2022-10-04 DIAGNOSIS — C50811 Malignant neoplasm of overlapping sites of right female breast: Secondary | ICD-10-CM

## 2022-10-04 HISTORY — DX: Personal history of antineoplastic chemotherapy: Z92.21

## 2022-10-04 HISTORY — DX: Personal history of irradiation: Z92.3

## 2022-10-10 ENCOUNTER — Other Ambulatory Visit: Payer: 59

## 2022-10-10 ENCOUNTER — Inpatient Hospital Stay: Payer: 59 | Attending: Hematology and Oncology

## 2022-10-10 ENCOUNTER — Ambulatory Visit: Payer: 59

## 2022-10-10 VITALS — BP 108/72 | HR 78 | Temp 98.2°F | Resp 18

## 2022-10-10 DIAGNOSIS — Z9011 Acquired absence of right breast and nipple: Secondary | ICD-10-CM | POA: Insufficient documentation

## 2022-10-10 DIAGNOSIS — C50811 Malignant neoplasm of overlapping sites of right female breast: Secondary | ICD-10-CM | POA: Diagnosis present

## 2022-10-10 DIAGNOSIS — Z170 Estrogen receptor positive status [ER+]: Secondary | ICD-10-CM | POA: Insufficient documentation

## 2022-10-10 DIAGNOSIS — Z5111 Encounter for antineoplastic chemotherapy: Secondary | ICD-10-CM | POA: Diagnosis not present

## 2022-10-10 DIAGNOSIS — Z79811 Long term (current) use of aromatase inhibitors: Secondary | ICD-10-CM | POA: Diagnosis not present

## 2022-10-10 DIAGNOSIS — Z923 Personal history of irradiation: Secondary | ICD-10-CM | POA: Insufficient documentation

## 2022-10-10 DIAGNOSIS — Z9221 Personal history of antineoplastic chemotherapy: Secondary | ICD-10-CM | POA: Insufficient documentation

## 2022-10-10 MED ORDER — GOSERELIN ACETATE 3.6 MG ~~LOC~~ IMPL
3.6000 mg | DRUG_IMPLANT | SUBCUTANEOUS | Status: DC
  Administered 2022-10-10: 3.6 mg via SUBCUTANEOUS
  Filled 2022-10-10: qty 3.6

## 2022-10-16 ENCOUNTER — Encounter: Payer: Self-pay | Admitting: *Deleted

## 2022-10-16 NOTE — Progress Notes (Signed)
Received message from [facility] team stating pt is wishing to cancel ongoing/future draws.  MD notified and verbalized understanding with verbal orders to cancel all future draws.

## 2022-11-05 ENCOUNTER — Ambulatory Visit: Payer: 59

## 2022-11-07 ENCOUNTER — Inpatient Hospital Stay: Payer: 59

## 2022-11-07 VITALS — BP 111/75 | HR 94 | Temp 98.0°F | Resp 18

## 2022-11-07 DIAGNOSIS — Z5111 Encounter for antineoplastic chemotherapy: Secondary | ICD-10-CM | POA: Diagnosis not present

## 2022-11-07 DIAGNOSIS — Z170 Estrogen receptor positive status [ER+]: Secondary | ICD-10-CM

## 2022-11-07 MED ORDER — GOSERELIN ACETATE 3.6 MG ~~LOC~~ IMPL
3.6000 mg | DRUG_IMPLANT | SUBCUTANEOUS | Status: DC
  Administered 2022-11-07: 3.6 mg via SUBCUTANEOUS
  Filled 2022-11-07: qty 3.6

## 2022-11-22 ENCOUNTER — Encounter: Payer: Self-pay | Admitting: Hematology and Oncology

## 2022-11-24 ENCOUNTER — Encounter: Payer: Self-pay | Admitting: Hematology and Oncology

## 2022-11-26 ENCOUNTER — Ambulatory Visit: Payer: 59 | Attending: Plastic Surgery

## 2022-11-26 VITALS — Wt 154.0 lb

## 2022-11-26 DIAGNOSIS — Z483 Aftercare following surgery for neoplasm: Secondary | ICD-10-CM | POA: Insufficient documentation

## 2022-11-26 NOTE — Therapy (Signed)
OUTPATIENT PHYSICAL THERAPY SOZO SCREENING NOTE   Patient Name: Allison Reed MRN: [id number] DOB:1976/10/24, 46 y.o., female Today's Date: 11/26/2022  PCP: [name], MD REFERRING PROVIDER: Irene Limbo, MD   PT End of Session - 11/26/22 1556     Visit Number 9   # unchanged due to screen only   PT Start Time 1554    PT Stop Time 1559    PT Time Calculation (min) 5 min    Activity Tolerance Patient tolerated treatment well    Behavior During Therapy [facility] for tasks assessed/performed             Past Medical History:  Diagnosis Date   Diabetes mellitus without complication ([place])    Type 2   Family history of brain tumor    Family history of skin cancer    Gestational diabetes    Headache    otc med prn   Heartburn in pregnancy    Incompetent cervix in pregnancy 04/2015   Missed abortion    no surgery required   Personal history of chemotherapy    Personal history of radiation therapy    Postpartum care following cesarean delivery (11/2) 10/12/2015   right breast ca 06/2021   UTI in pregnancy 03/2015   recent UTI, treatment completed   Past Surgical History:  Procedure Laterality Date   APPENDECTOMY  12/10/2004   BREAST BIOPSY Right 2022   BREAST BIOPSY Left 2022   BREAST RECONSTRUCTION WITH PLACEMENT OF TISSUE EXPANDER AND ALLODERM Right 07/25/2021   Procedure: RIGHT BREAST RECONSTRUCTION WITH PLACEMENT OF TISSUE EXPANDER AND ALLODERM;  Surgeon: Irene Limbo, MD;  Location: [place];  Service: Plastics;  Laterality: Right;   CERCLAGE LAPAROSCOPIC ABDOMINAL N/A 04/15/2015   Procedure: LAPAROSCOPIC TRANSABDOMINAL CERVCOISTHMIC CERCLAGE;  Surgeon: Governor Specking, MD;  Location: [facility];  Service: Gynecology;  Laterality: N/A;   CERVICAL CERCLAGE  02/08/2011   vaginal   CESAREAN SECTION N/A 10/12/2015   Procedure: CESAREAN SECTION;  Surgeon: Princess Bruins, MD;  Location: [facility];  Service: Obstetrics;  Laterality: N/A;    CESAREAN SECTION N/A 03/03/2018   Procedure: Repeat CESAREAN SECTION/Removal of Abdominal Cerclage;  Surgeon: Azucena Fallen, MD;  Location: [place];  Service: Obstetrics;  Laterality: N/A;  EDD: 03/28/18   MASTECTOMY Right 2022   MASTECTOMY W/ SENTINEL NODE BIOPSY Right 07/25/2021   Procedure: RIGHT MASTECTOMY WITH AXILLARY SENTINEL LYMPH NODE BIOPSY;  Surgeon: [name], MD;  Location: [place];  Service: General;  Laterality: Right;   PORTACATH PLACEMENT N/A 08/24/2021   Procedure: INSERTION PORT-A-CATH;  Surgeon: [name], MD;  Location: [place];  Service: General;  Laterality: N/A;   RADIOACTIVE SEED GUIDED AXILLARY SENTINEL LYMPH NODE Right 07/25/2021   Procedure: RADIOACTIVE SEED GUIDED RIGHT AXILLARY NODE EXCISION;  Surgeon: [name], MD;  Location: [place];  Service: General;  Laterality: Right;   TISSUE EXPANDER PLACEMENT Right 12/04/2021   Procedure: REMOVAL RIGHT CHEST TISSUE EXPANDER;  Surgeon: Irene Limbo, MD;  Location: [place];  Service: Plastics;  Laterality: Right;   WISDOM TOOTH EXTRACTION  12/11/1999   Patient Active Problem List   Diagnosis Date Noted   Lower leg mass, left 05/02/2022   Port-A-Cath in place 08/25/2021   S/P mastectomy, right 07/25/2021   Genetic testing 07/01/2021   Family history of skin cancer 06/22/2021   Family history of brain tumor 06/22/2021   Malignant neoplasm of overlapping sites of right breast in female, estrogen receptor  positive ([place]) 06/21/2021   Status post repeat low transverse cesarean section 03/03/2018   DM (diabetes mellitus) in pregnancy 03/02/2018   Incompetent cervix in pregnancy 03/02/2018    REFERRING DIAG: right breast cancer at risk for lymphedema  THERAPY DIAG:  Aftercare following surgery for neoplasm  PERTINENT HISTORY: 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 She noted a palpable right breast mass. This  is followed by a mammogram with a mass with calcifications as really throughout the upper outer quadrant and in the central breast. This measures 9.4 x 8.7 x 6.7 cm. On ultrasound she had 3 separate masses at 10, 11, and 12:00. The sizes are 1.7, 2.1, and 2.2 cm. There is also a single abnormal axillary lymph node that has undergone biopsy. 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 She is s/p right breast mastectomy with SLNB 0/7 LN on August 16 andl also had tissue expanders in place.  She is s/p chemotherapy for 6 months every other week and had radiation following ; She had emergency sugery to remove expander 12/04/21; completed radiation 04/04/22    PRECAUTIONS: right UE Lymphedema risk, None  SUBJECTIVE: Pt returns for her 3 month L-Dex screen. "I went to [place] and we got back about 3 weeks ago."  PAIN:  Are you having pain? No  SOZO SCREENING: Patient was assessed today using the SOZO machine to determine the lymphedema index score. This was compared to her baseline score. It was determined that she is NOT within the recommended range when compared to her baseline. It is recommended she return in 1 month to be reassessed. And to cont wearing her compression sleeve daily for up to 10 hours daily. If she continues to measure outside the recommended range, physical therapy treatment will be recommended at that time and a referral requested.   L-DEX FLOWSHEETS - 11/26/22 1500       L-DEX LYMPHEDEMA SCREENING   Measurement Type Unilateral    L-DEX MEASUREMENT EXTREMITY Upper Extremity    POSITION  Standing    DOMINANT SIDE Right    At Risk Side Right    BASELINE SCORE  (UNILATERAL) 0    L-DEX SCORE (UNILATERAL) 7.9    VALUE CHANGE (UNILAT) 7.9              Otelia Limes, PTA 11/26/2022, 3:59 PM

## 2022-11-29 ENCOUNTER — Inpatient Hospital Stay: Payer: 59

## 2022-11-29 ENCOUNTER — Other Ambulatory Visit: Payer: Self-pay | Admitting: Hematology and Oncology

## 2022-11-29 NOTE — Progress Notes (Signed)
Patient is one week early for Zoladex injection, per pharmacy insurance may not cover injection. Patient was told to reschedule appointment. She verbalized understanding.

## 2022-11-30 ENCOUNTER — Encounter: Payer: Self-pay | Admitting: Hematology and Oncology

## 2022-11-30 ENCOUNTER — Telehealth: Payer: Self-pay | Admitting: Pharmacy Technician

## 2022-11-30 ENCOUNTER — Other Ambulatory Visit: Payer: Self-pay | Admitting: Hematology and Oncology

## 2022-11-30 ENCOUNTER — Other Ambulatory Visit (HOSPITAL_COMMUNITY): Payer: Self-pay

## 2022-11-30 ENCOUNTER — Telehealth: Payer: Self-pay

## 2022-11-30 DIAGNOSIS — C50811 Malignant neoplasm of overlapping sites of right female breast: Secondary | ICD-10-CM

## 2022-11-30 MED ORDER — ABEMACICLIB 100 MG PO TABS
100.0000 mg | ORAL_TABLET | Freq: Two times a day (BID) | ORAL | 3 refills | Status: DC
  Filled 2022-11-30: qty 70, 35d supply, fill #0

## 2022-11-30 NOTE — Progress Notes (Signed)
Patient decided to start Verzinio for breast cancer prevention. I sent the prescription of 100 mg p.o. twice daily to the pharmacy. If she tolerates it well we can consider increasing the dose to 150 p.o. twice daily or if she has diarrhea we can reduce it to 50 twice daily.  She will follow-up with Jenny Reichmann in 3 weeks with labs.

## 2022-11-30 NOTE — Telephone Encounter (Signed)
Oral Oncology Patient Advocate Encounter   Received notification that prior authorization for Verzenio is required.   PA submitted on 11/30/22 Key [id number] Status is pending     Allison Reed, CPhT-Adv Oncology Pharmacy Patient [place] Direct Number: [phone]  Fax: [phone]

## 2022-11-30 NOTE — Telephone Encounter (Addendum)
Oral Oncology Pharmacist Encounter  Received new prescription for abemaciclib (Verzenio) for the adjuvant treatment of ER+, HER2- breast cancer in conjunction with letrozole and zoladex, planned duration until disease progression or unacceptable toxicity or for 2 years of therapy.  Labs from 09/11/22 assessed, no interventions needed. Prescription dose and frequency assessed.  Current medication list in Epic reviewed, DDIs with Verzenio identified:  - metformin: may be increased when given with verzenio. Will have patient monitor for low blood sugars.   Evaluated chart and no patient barriers to medication adherence noted.   Patient agreement for treatment documented in MD note on 11/30/22.  Prescription has been e-scribed to the [facility] for benefits analysis and approval.  Oral Oncology Clinic will continue to follow for insurance authorization, copayment issues, initial counseling and start date.  Drema Halon, PharmD Hematology/Oncology Clinical Pharmacist [facility] [phone] 11/30/2022 1:40 PM

## 2022-11-30 NOTE — Telephone Encounter (Addendum)
Oral Oncology Patient Advocate Encounter  PA must be completed via phone [place], [place] Patient [place] Direct Number: [phone]  Fax: [phone]

## 2022-12-04 ENCOUNTER — Other Ambulatory Visit (HOSPITAL_COMMUNITY): Payer: Self-pay

## 2022-12-04 MED ORDER — ABEMACICLIB 100 MG PO TABS: 100.0000 mg | ORAL_TABLET | Freq: Two times a day (BID) | ORAL | 3 refills | Status: DC

## 2022-12-04 NOTE — Telephone Encounter (Signed)
Oral Oncology Patient Advocate Encounter  Prior Authorization for Allison Reed has been approved.    PA# [id number] Effective dates: 12/04/22 through 12/05/23  Patient must fill at [facility].    Allison Reed, CPhT-Adv Oncology Pharmacy Patient [place] Direct Number: [phone]  Fax: [phone]

## 2022-12-05 ENCOUNTER — Inpatient Hospital Stay: Payer: 59

## 2022-12-05 ENCOUNTER — Telehealth: Payer: Self-pay | Admitting: Hematology and Oncology

## 2022-12-05 NOTE — Telephone Encounter (Signed)
Patient called to r/s appointments. Patient notified.

## 2022-12-11 ENCOUNTER — Inpatient Hospital Stay: Payer: 59 | Attending: Hematology and Oncology

## 2022-12-11 VITALS — BP 106/70 | HR 94 | Temp 98.2°F | Resp 18

## 2022-12-11 DIAGNOSIS — C50811 Malignant neoplasm of overlapping sites of right female breast: Secondary | ICD-10-CM | POA: Insufficient documentation

## 2022-12-11 DIAGNOSIS — Z170 Estrogen receptor positive status [ER+]: Secondary | ICD-10-CM | POA: Insufficient documentation

## 2022-12-11 DIAGNOSIS — Z79899 Other long term (current) drug therapy: Secondary | ICD-10-CM | POA: Insufficient documentation

## 2022-12-11 DIAGNOSIS — Z5111 Encounter for antineoplastic chemotherapy: Secondary | ICD-10-CM | POA: Insufficient documentation

## 2022-12-11 DIAGNOSIS — Z79811 Long term (current) use of aromatase inhibitors: Secondary | ICD-10-CM | POA: Diagnosis not present

## 2022-12-11 MED ORDER — GOSERELIN ACETATE 3.6 MG ~~LOC~~ IMPL
3.6000 mg | DRUG_IMPLANT | SUBCUTANEOUS | Status: DC
  Administered 2022-12-11: 3.6 mg via SUBCUTANEOUS
  Filled 2022-12-11: qty 3.6

## 2022-12-11 NOTE — Telephone Encounter (Addendum)
Oral Chemotherapy Pharmacist Encounter  I spoke with patient for overview of: Verzenio for the treatment of ER+, HER2- breast cancer, hormone-receptor positive breast cancer, in combination with letrozole and zoladex, planned duration until disease progression or unacceptable toxicity or for 2 years of therapy.   Counseled patient on administration, dosing, side effects, monitoring, drug-food interactions, safe handling, storage, and disposal.  Patient will take Verzenio 130m tablets, 1 tablet by mouth twice daily without regard to food.  Patient knows to avoid grapefruit and grapefruit juice.  Verzenio start date: 12/12/2021  Adverse effects include but are not limited to: diarrhea, fatigue, nausea, abdominal pain, decreased blood counts, and increased liver function tests, and joint pains. Severe, life-threatening, and/or fatal interstitial lung disease (ILD) and/or pneumonitis may occur with CDK 4/6 inhibitors.  Patient will call me or the office for anti-emetics if nausea develops.   Patient will obtain anti diarrheal and alert the office of 4 or more loose stools above baseline.  Reviewed with patient importance of keeping a medication schedule and plan for any missed doses. No barriers to medication adherence identified.  Medication reconciliation performed and medication/allergy list updated.  Insurance authorization for [employer] been obtained. [facility]   Patient informed the pharmacy will reach out 5-7 days prior to needing next fill of Verzenio to coordinate continued medication acquisition to prevent break in therapy.  All questions answered.  Patient voiced understanding and appreciation.   Medication education handout placed in mail for patient. Patient knows to call the office with questions or concerns. Oral Chemotherapy Clinic phone number provided to patient.   KDrema Halon PharmD Hematology/Oncology Clinical  Pharmacist [phone]/01/2023   3:44 PM

## 2022-12-13 ENCOUNTER — Other Ambulatory Visit (HOSPITAL_COMMUNITY): Payer: Self-pay

## 2022-12-18 ENCOUNTER — Other Ambulatory Visit: Payer: 59

## 2022-12-18 ENCOUNTER — Ambulatory Visit: Payer: 59 | Admitting: Pharmacist

## 2022-12-20 ENCOUNTER — Other Ambulatory Visit: Payer: 59

## 2022-12-20 ENCOUNTER — Ambulatory Visit: Payer: 59 | Admitting: Pharmacist

## 2022-12-24 ENCOUNTER — Ambulatory Visit: Payer: 59 | Attending: Plastic Surgery

## 2022-12-24 VITALS — Wt 155.5 lb

## 2022-12-24 DIAGNOSIS — Z483 Aftercare following surgery for neoplasm: Secondary | ICD-10-CM | POA: Insufficient documentation

## 2022-12-24 NOTE — Therapy (Signed)
OUTPATIENT PHYSICAL THERAPY SOZO SCREENING NOTE   Patient Name: Allison Reed MRN: [id number] DOB:03-31-1976, 47 y.o., female Today's Date: 12/24/2022  PCP: [name], MD REFERRING PROVIDER: Irene Limbo, MD   PT End of Session - 12/24/22 1554     Visit Number 9   # unchanged due to screen only   PT Start Time 1551    PT Stop Time 1559    PT Time Calculation (min) 8 min             Past Medical History:  Diagnosis Date   Diabetes mellitus without complication ([place])    Type 2   Family history of brain tumor    Family history of skin cancer    Gestational diabetes    Headache    otc med prn   Heartburn in pregnancy    Incompetent cervix in pregnancy 04/2015   Missed abortion    no surgery required   Personal history of chemotherapy    Personal history of radiation therapy    Postpartum care following cesarean delivery (11/2) 10/12/2015   right breast ca 06/2021   UTI in pregnancy 03/2015   recent UTI, treatment completed   Past Surgical History:  Procedure Laterality Date   APPENDECTOMY  12/10/2004   BREAST BIOPSY Right 2022   BREAST BIOPSY Left 2022   BREAST RECONSTRUCTION WITH PLACEMENT OF TISSUE EXPANDER AND ALLODERM Right 07/25/2021   Procedure: RIGHT BREAST RECONSTRUCTION WITH PLACEMENT OF TISSUE EXPANDER AND ALLODERM;  Surgeon: Irene Limbo, MD;  Location: [place];  Service: Plastics;  Laterality: Right;   CERCLAGE LAPAROSCOPIC ABDOMINAL N/A 04/15/2015   Procedure: LAPAROSCOPIC TRANSABDOMINAL CERVCOISTHMIC CERCLAGE;  Surgeon: Governor Specking, MD;  Location: [place] [facility];  Service: Gynecology;  Laterality: N/A;   CERVICAL CERCLAGE  02/08/2011   vaginal   CESAREAN SECTION N/A 10/12/2015   Procedure: CESAREAN SECTION;  Surgeon: Princess Bruins, MD;  Location: [place] [facility];  Service: Obstetrics;  Laterality: N/A;   CESAREAN SECTION N/A 03/03/2018   Procedure: Repeat CESAREAN SECTION/Removal of Abdominal Cerclage;  Surgeon: Azucena Fallen, MD;  Location: [place];  Service: Obstetrics;  Laterality: N/A;  EDD: 03/28/18   MASTECTOMY Right 2022   MASTECTOMY W/ SENTINEL NODE BIOPSY Right 07/25/2021   Procedure: RIGHT MASTECTOMY WITH AXILLARY SENTINEL LYMPH NODE BIOPSY;  Surgeon: [name], MD;  Location: [place];  Service: General;  Laterality: Right;   PORTACATH PLACEMENT N/A 08/24/2021   Procedure: INSERTION PORT-A-CATH;  Surgeon: [name], MD;  Location: [place];  Service: General;  Laterality: N/A;   RADIOACTIVE SEED GUIDED AXILLARY SENTINEL LYMPH NODE Right 07/25/2021   Procedure: RADIOACTIVE SEED GUIDED RIGHT AXILLARY NODE EXCISION;  Surgeon: [name], MD;  Location: [place];  Service: General;  Laterality: Right;   TISSUE EXPANDER PLACEMENT Right 12/04/2021   Procedure: REMOVAL RIGHT CHEST TISSUE EXPANDER;  Surgeon: Irene Limbo, MD;  Location: [place];  Service: Plastics;  Laterality: Right;   WISDOM TOOTH EXTRACTION  12/11/1999   Patient Active Problem List   Diagnosis Date Noted   Lower leg mass, left 05/02/2022   Port-A-Cath in place 08/25/2021   S/P mastectomy, right 07/25/2021   Genetic testing 07/01/2021   Family history of skin cancer 06/22/2021   Family history of brain tumor 06/22/2021   Malignant neoplasm of overlapping sites of right breast in female, estrogen receptor positive ([place]) 06/21/2021   Status post repeat low transverse cesarean section 03/03/2018   DM (diabetes mellitus) in  pregnancy 03/02/2018   Incompetent cervix in pregnancy 03/02/2018    REFERRING DIAG: right breast cancer at risk for lymphedema  THERAPY DIAG: Aftercare following surgery for neoplasm  PERTINENT HISTORY: 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 She noted a palpable right breast mass. This is followed by a mammogram with a mass with calcifications as really throughout the upper outer quadrant and in the  central breast. This measures 9.4 x 8.7 x 6.7 cm. On ultrasound she had 3 separate masses at 10, 11, and 12:00. The sizes are 1.7, 2.1, and 2.2 cm. There is also a single abnormal axillary lymph node that has undergone biopsy. 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 She is s/p right breast mastectomy with SLNB 0/7 LN on August 16 andl also had tissue expanders in place.  She is s/p chemotherapy for 6 months every other week and had radiation following ; She had emergency sugery to remove expander 12/04/21; completed radiation 04/04/22    PRECAUTIONS: right UE Lymphedema risk, None  SUBJECTIVE: Pt returns for her 1 month L-Dex screen after last screen detected subclinical lymphedema.   PAIN:  Are you having pain? No  SOZO SCREENING: Patient was assessed today using the SOZO machine to determine the lymphedema index score. This was compared to her baseline score. It was determined that she is NOT within the recommended range when compared to her baseline. It is recommended she return in 1 month to be reassessed. And to cont wearing her compression sleeve daily for up to 10 hours daily. If she continues to measure outside the recommended range, physical therapy treatment will be recommended at that time and a referral requested.   L-DEX FLOWSHEETS - 12/24/22 1600       L-DEX LYMPHEDEMA SCREENING   Measurement Type Unilateral    L-DEX MEASUREMENT EXTREMITY Upper Extremity    POSITION  Standing    DOMINANT SIDE Right    At Risk Side Right    BASELINE SCORE (UNILATERAL) 0    L-DEX SCORE (UNILATERAL) -1.4    VALUE CHANGE (UNILAT) -1.4              Otelia Limes, PTA 12/24/2022, 4:04 PM

## 2022-12-25 ENCOUNTER — Inpatient Hospital Stay: Payer: 59

## 2022-12-25 ENCOUNTER — Inpatient Hospital Stay: Payer: 59 | Admitting: Pharmacist

## 2022-12-25 VITALS — BP 115/79 | HR 92 | Temp 97.4°F | Resp 18 | Ht 67.0 in | Wt 156.1 lb

## 2022-12-25 DIAGNOSIS — Z5111 Encounter for antineoplastic chemotherapy: Secondary | ICD-10-CM | POA: Diagnosis not present

## 2022-12-25 DIAGNOSIS — Z170 Estrogen receptor positive status [ER+]: Secondary | ICD-10-CM

## 2022-12-25 LAB — CBC WITH DIFFERENTIAL (CANCER CENTER ONLY)
Abs Immature Granulocytes: 0 10*3/uL (ref 0.00–0.07)
Basophils Absolute: 0 10*3/uL (ref 0.0–0.1)
Basophils Relative: 1 %
Eosinophils Absolute: 0.1 10*3/uL (ref 0.0–0.5)
Eosinophils Relative: 2 %
HCT: 45.7 % (ref 36.0–46.0)
Hemoglobin: 15.9 g/dL — ABNORMAL HIGH (ref 12.0–15.0)
Immature Granulocytes: 0 %
Lymphocytes Relative: 43 %
Lymphs Abs: 1.8 10*3/uL (ref 0.7–4.0)
MCH: 27.5 pg (ref 26.0–34.0)
MCHC: 34.8 g/dL (ref 30.0–36.0)
MCV: 78.9 fL — ABNORMAL LOW (ref 80.0–100.0)
Monocytes Absolute: 0.2 10*3/uL (ref 0.1–1.0)
Monocytes Relative: 5 %
Neutro Abs: 2 10*3/uL (ref 1.7–7.7)
Neutrophils Relative %: 49 %
Platelet Count: 192 10*3/uL (ref 150–400)
RBC: 5.79 MIL/uL — ABNORMAL HIGH (ref 3.87–5.11)
RDW: 13.8 % (ref 11.5–15.5)
WBC Count: 4.2 10*3/uL (ref 4.0–10.5)
nRBC: 0 % (ref 0.0–0.2)

## 2022-12-25 LAB — CMP (CANCER CENTER ONLY)
ALT: 30 U/L (ref 0–44)
AST: 25 U/L (ref 15–41)
Albumin: 4.5 g/dL (ref 3.5–5.0)
Alkaline Phosphatase: 66 U/L (ref 38–126)
Anion gap: 7 (ref 5–15)
BUN: 11 mg/dL (ref 6–20)
CO2: 30 mmol/L (ref 22–32)
Calcium: 10.1 mg/dL (ref 8.9–10.3)
Chloride: 102 mmol/L (ref 98–111)
Creatinine: 0.73 mg/dL (ref 0.44–1.00)
GFR, Estimated: >60 mL/min (ref >60)
Glucose, Bld: 163 mg/dL — ABNORMAL HIGH (ref 70–99)
Potassium: 4 mmol/L (ref 3.5–5.1)
Sodium: 139 mmol/L (ref 135–145)
Total Bilirubin: 0.4 mg/dL (ref 0.3–1.2)
Total Protein: 7.2 g/dL (ref 6.5–8.1)

## 2022-12-25 NOTE — Progress Notes (Signed)
[place]       Telephone: [phone]?Fax: [phone]   Oncology Clinical Pharmacist Practitioner Initial Assessment  Allison Reed is a 47 y.o. female with a diagnosis of breast cancer. They were contacted today via in-person visit.  Indication/Regimen Abemaciclib (Verzenio) is being used appropriately for treatment of breast cancer in the adjuvant setting by Dr. [name].      Wt Readings from Last 1 Encounters:  12/25/22 156 lb 1.6 oz (70.8 kg)    Estimated body surface area is 1.83 meters squared as calculated from the following:   Height as of this encounter: '5\' 7"'$  (1.702 m).   Weight as of this encounter: 156 lb 1.6 oz (70.8 kg).  The dosing regimen is 100 mg by mouth every 12 hours on days 1 to 28 of a 28-day cycle. This is being given  in combination with letrozole started 07/16/22 and goserelin started 03/14/22 . It is planned to continue until  two years in the adjuvant setting per the monarchE trial data .  Allison Reed was seen today by clinical pharmacy to establish care for her abemaciclib management after being referred by Dr. Lindi Adie.  She states that she started abemaciclib on 12/13/22 but states, "against medical advice", she only took 1 tablet daily for 4 days and then started the recommended 1 tablet every 12 hours.  We went over the reasoning why abemaciclib needs to be taken every 12 hours and she verbalized understanding.  Allison Reed states that she does not like to take medications and prefers to try to manage the diarrhea that can be associated with abemaciclib by diet instead.  She feels that pomegranate juice and coffee have historically constipated her, and she has been using these with success since starting abemaciclib.  She has not needed to take loperamide since starting abemaciclib reports loose stools x 3 in the past week.  However, none today so far.  She feels this is manageable.  She states that she also has had very little nausea  historically when on chemotherapy and she would prefer to contact Dr. Geralyn Flash clinic should she experience any nausea.  She states she used ondansetron when she was pregnant and it actually made the nausea worse.  We told her that we do have other agents that we can trial should she have nausea and she said that she would consider this.  Today we went over possible side effects of abemaciclib which include but are not limited to diarrhea, neutropenia, pneumonitis, liver toxicity, blood clots, infertility, fatigue, hair loss, nausea, and vomiting.  We also went over that she should avoid grapefruit products.  We discussed proper storage and handling of the medication.  She states she is having no issues with obtaining her abemaciclib from [facility].  We discussed today that we will potentially increase the dose of abemaciclib to 150 mg every 12 hours in the next 2 to 4 weeks if she continues to tolerate abemaciclib well at this dose.  She was in agreement with the plan.  She will continue to receive goserelin every 4 weeks which is next due on 01/08/23.  Clinical pharmacy will plan on seeing her with labs on this date.  Did discuss today that manufacturing guidelines recommend labs every 2 weeks for 2 months followed by monthly labs for 2 months, and then as clinically indicated.  She has a DEXA scan scheduled for 01/04/23 and we reviewed the purpose of the scan since she is  on letrozole.  She will next see Dr. Lindi Adie with labs on 03/12/23.  Marland Kitchen    Dose Modifications Dr. Lindi Adie has prescribed abemaciclib at a reduced dose of 100 mg every 12 hours.  She will titrate the dose upwards as tolerated.  Access Assessment Allison Reed will be receiving abemaciclib through [facility] Insurance Concerns: No Start date if known: 12/13/22 but did not start taking twice daily dosing until 12/17/22  Allergies Allergies  Allergen Reactions   Betadine [Povidone Iodine] Rash   Tape Rash     Vitals    12/25/2022    1:12 PM 12/24/2022    3:54 PM 12/11/2022    9:08 AM  Oncology Vitals  Height 170 cm    Weight 70.806 kg 70.534 kg   Weight (lbs) 156 lbs 2 oz 155 lbs 8 oz   BMI 24.45 kg/m2   24.45 kg/m2 24.35 kg/m2   24.35 kg/m2   Temp 97.4 F (36.3 C)  98.2 F (36.8 C)  Pulse Rate 92  94  BP 115/79  106/70  Resp 18  18  SpO2 100 %  98 %  BSA (m2) 1.83 m2   1.83 m2 1.83 m2   1.83 m2      Laboratory Data    Latest Ref Rng & Units 12/25/2022   12:36 PM 09/11/2022    2:56 PM 07/16/2022    3:24 PM  CBC EXTENDED  WBC 4.0 - 10.5 K/uL 4.2  4.8  4.1   RBC 3.87 - 5.11 MIL/uL 5.79  5.41  5.66   Hemoglobin 12.0 - 15.0 g/dL 15.9  14.6  15.0   HCT 36.0 - 46.0 % 45.7  41.7  44.0   Platelets 150 - 400 K/uL 192  202  213   NEUT# 1.7 - 7.7 K/uL 2.0  2.6  2.2   Lymph# 0.7 - 4.0 K/uL 1.8  1.7  1.5        Latest Ref Rng & Units 12/25/2022   12:36 PM 09/11/2022    2:56 PM 07/16/2022    3:24 PM  CMP  Glucose 70 - 99 mg/dL 163  239  255   BUN 6 - 20 mg/dL '11  15  11   '$ Creatinine 0.44 - 1.00 mg/dL 0.73  0.59  0.57   Sodium 135 - 145 mmol/L 139  137  137   Potassium 3.5 - 5.1 mmol/L 4.0  3.9  4.1   Chloride 98 - 111 mmol/L 102  99  100   CO2 22 - 32 mmol/L '30  29  27   '$ Calcium 8.9 - 10.3 mg/dL 10.1  9.9  10.2   Total Protein 6.5 - 8.1 g/dL 7.2  6.7  7.5   Total Bilirubin 0.3 - 1.2 mg/dL 0.4  0.4  0.4   Alkaline Phos 38 - 126 U/L 66  63  73   AST 15 - 41 U/L '25  21  19   '$ ALT 0 - 44 U/L '30  22  26    '$ Contraindications Contraindications were reviewed?  Yes Contraindications to therapy were identified?  No  Safety Precautions The following safety precautions for the use of abemaciclib were reviewed:  Diarrhea: we reviewed that diarrhea is common with abemaciclib and confirmed that she does have loperamide (Imodium) at home.  We reviewed how to take this medication PRN and gave her information on abemaciclib Neutropenia: we discussed the importance of having a thermometer and  what the Centers for Disease Control and  Prevention (CDC) considers a fever which is 100.42F (38C) or higher.  Gave patient 24/7 triage line to call if any fevers or symptoms ILD/Pneumonitis: we reviewed potential symptoms including cough, shortness, and fatigue. Hepatotoxicity: reviewed to contact clinic for RUQ pain that will not subside, yellowing of eyes/skin Venous thromboembolism (VTE): reviewed signs of deep vein thrombosis (DVT) such as leg swelling, redness, pain, or tenderness and signs of pulmonary embolism (PE) such as shortness of breath, rapid or irregular heartbeat, cough, chest pain, or lightheadedness Reviewed to take the medication every 12 hours (with food sometimes can be easier on the stomach) and to take it at the same time every day. Discussed proper storage and handling of abemaciclib Storage and handling, missed doses  Medication Reconciliation Current Outpatient Medications  Medication Sig Dispense Refill   ergocalciferol (VITAMIN D2) 1.25 MG (50000 UT) capsule Take 1 capsule (50,000 Units total) by mouth once a week. 12 capsule 0   letrozole (FEMARA) 2.5 MG tablet Take 1 tablet (2.5 mg total) by mouth daily. 90 tablet 3   Multiple Vitamins-Minerals (WOMENS DAILY FORMULA PO) Take by mouth daily.     ONETOUCH VERIO test strip 1 each by Other route 2 (two) times daily.     TRADJENTA 5 MG TABS tablet Take 5 mg by mouth daily.     abemaciclib (VERZENIO) 100 MG tablet Take 1 tablet (100 mg total) by mouth 2 (two) times daily. 60 tablet 3   Empagliflozin-metFORMIN HCl ER (SYNJARDY XR) 09-999 MG TB24 Take by mouth.     No current facility-administered medications for this visit.   Medication reconciliation is based on the patient's most recent medication list in the electronic medical record (EMR) including herbal products and OTC medications.   The patient's medication list was reviewed today with the patient?  Yes  Drug-drug interactions (DDIs) DDIs were evaluated?   Yes Significant DDIs identified?  No  Drug-Food Interactions Drug-food interactions were evaluated?  Yes Drug-food interactions identified?  Avoid grapefruit products  Follow-up Plan  Continue abemaciclib 100 mg by mouth every 12 hours.  Will consider increasing dose in 2 to 4 weeks if she continues to tolerate it well. Continue goserelin 3.6 mg every 28 days.  Next due on 01/08/23 Continue letrozole 2.5 mg by mouth daily Will add labs, pharmacy clinic visit, on 01/08/23.  Allison Reed prefers labs prior to 8a goserelin injection and then see clinical pharmacy afterwards Dexa scan scheduled for 01/04/23 Lab, Dr. Lindi Adie visit, goserelin currently on 03/12/23 but goserelin is due a week prior on 03/05/23 will move these appointments  Allison Reed participated in the discussion, expressed understanding, and voiced agreement with the above plan. All questions were answered to her satisfaction. The patient was advised to contact the clinic at [phone] with any questions or concerns prior to her return visit.   I spent 60 minutes assessing the patient.  Raina Mina, RPH-CPP, 12/25/2022 2:47 PM  **Disclaimer: This note was dictated with voice recognition software. Similar sounding words can inadvertently be transcribed and this note may contain transcription errors which may not have been corrected upon publication of note.**

## 2023-01-01 ENCOUNTER — Telehealth: Payer: Self-pay | Admitting: Pharmacist

## 2023-01-01 NOTE — Telephone Encounter (Signed)
Scheduled and rescheduled appointments per los. Left voicemail.

## 2023-01-02 ENCOUNTER — Inpatient Hospital Stay: Payer: 59

## 2023-01-04 ENCOUNTER — Inpatient Hospital Stay: Admission: RE | Admit: 2023-01-04 | Payer: 59 | Source: Ambulatory Visit

## 2023-01-08 ENCOUNTER — Inpatient Hospital Stay: Payer: 59

## 2023-01-08 ENCOUNTER — Inpatient Hospital Stay: Payer: 59 | Admitting: Pharmacist

## 2023-01-08 VITALS — BP 100/75 | HR 87 | Temp 98.5°F | Resp 16

## 2023-01-08 DIAGNOSIS — Z170 Estrogen receptor positive status [ER+]: Secondary | ICD-10-CM

## 2023-01-08 DIAGNOSIS — Z5111 Encounter for antineoplastic chemotherapy: Secondary | ICD-10-CM | POA: Diagnosis not present

## 2023-01-08 LAB — CMP (CANCER CENTER ONLY)
ALT: 23 U/L (ref 0–44)
AST: 24 U/L (ref 15–41)
Albumin: 4.4 g/dL (ref 3.5–5.0)
Alkaline Phosphatase: 58 U/L (ref 38–126)
Anion gap: 10 (ref 5–15)
BUN: 12 mg/dL (ref 6–20)
CO2: 28 mmol/L (ref 22–32)
Calcium: 9.8 mg/dL (ref 8.9–10.3)
Chloride: 101 mmol/L (ref 98–111)
Creatinine: 0.77 mg/dL (ref 0.44–1.00)
GFR, Estimated: >60 mL/min (ref >60)
Glucose, Bld: 178 mg/dL — ABNORMAL HIGH (ref 70–99)
Potassium: 4.4 mmol/L (ref 3.5–5.1)
Sodium: 139 mmol/L (ref 135–145)
Total Bilirubin: 0.4 mg/dL (ref 0.3–1.2)
Total Protein: 7.6 g/dL (ref 6.5–8.1)

## 2023-01-08 LAB — CBC WITH DIFFERENTIAL (CANCER CENTER ONLY)
Abs Immature Granulocytes: 0.01 10*3/uL (ref 0.00–0.07)
Basophils Absolute: 0 10*3/uL (ref 0.0–0.1)
Basophils Relative: 1 %
Eosinophils Absolute: 0.1 10*3/uL (ref 0.0–0.5)
Eosinophils Relative: 2 %
HCT: 43.6 % (ref 36.0–46.0)
Hemoglobin: 15.3 g/dL — ABNORMAL HIGH (ref 12.0–15.0)
Immature Granulocytes: 0 %
Lymphocytes Relative: 44 %
Lymphs Abs: 1.3 10*3/uL (ref 0.7–4.0)
MCH: 27.5 pg (ref 26.0–34.0)
MCHC: 35.1 g/dL (ref 30.0–36.0)
MCV: 78.3 fL — ABNORMAL LOW (ref 80.0–100.0)
Monocytes Absolute: 0.3 10*3/uL (ref 0.1–1.0)
Monocytes Relative: 9 %
Neutro Abs: 1.3 10*3/uL — ABNORMAL LOW (ref 1.7–7.7)
Neutrophils Relative %: 44 %
Platelet Count: 221 10*3/uL (ref 150–400)
RBC: 5.57 MIL/uL — ABNORMAL HIGH (ref 3.87–5.11)
RDW: 13.6 % (ref 11.5–15.5)
WBC Count: 3 10*3/uL — ABNORMAL LOW (ref 4.0–10.5)
nRBC: 0 % (ref 0.0–0.2)

## 2023-01-08 IMAGING — DX DG TIBIA/FIBULA 2V*L*
4 series · 4 of 4 positions shown · non-contrast
Comparison: None.

CLINICAL DATA: Medial soft tissue mass

EXAM:
LEFT TIBIA AND FIBULA - 2 VIEW

[tibia ap (1 of 2)]
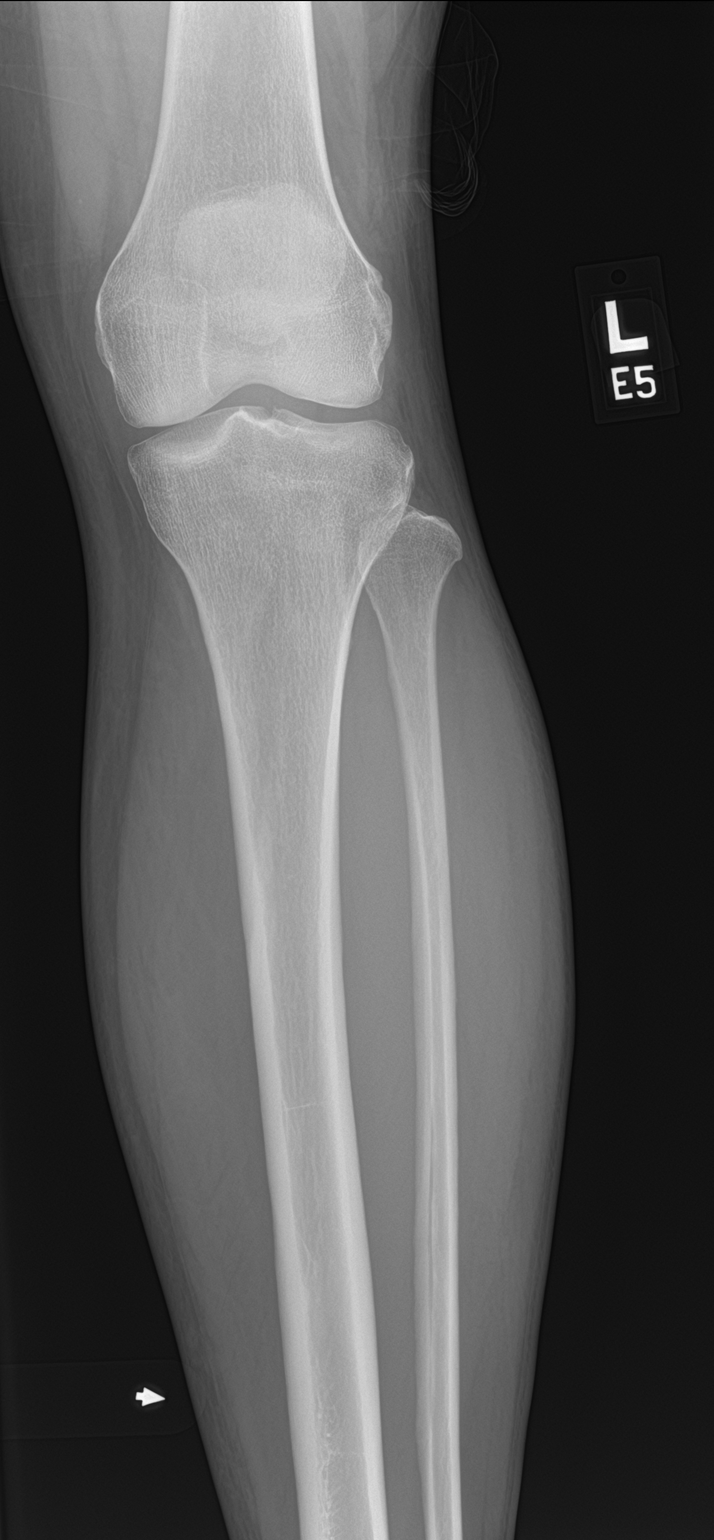

[tibia ap (2 of 2)]
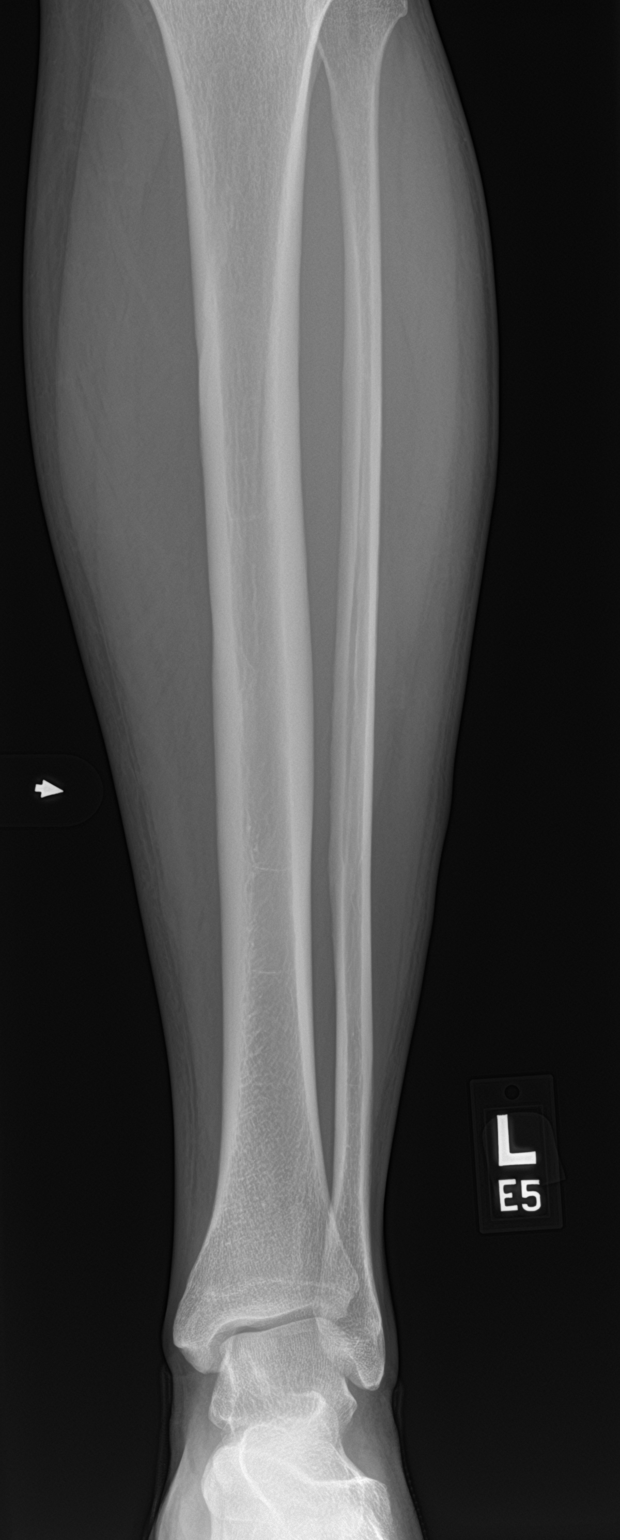

[tibia lat (1 of 2)]
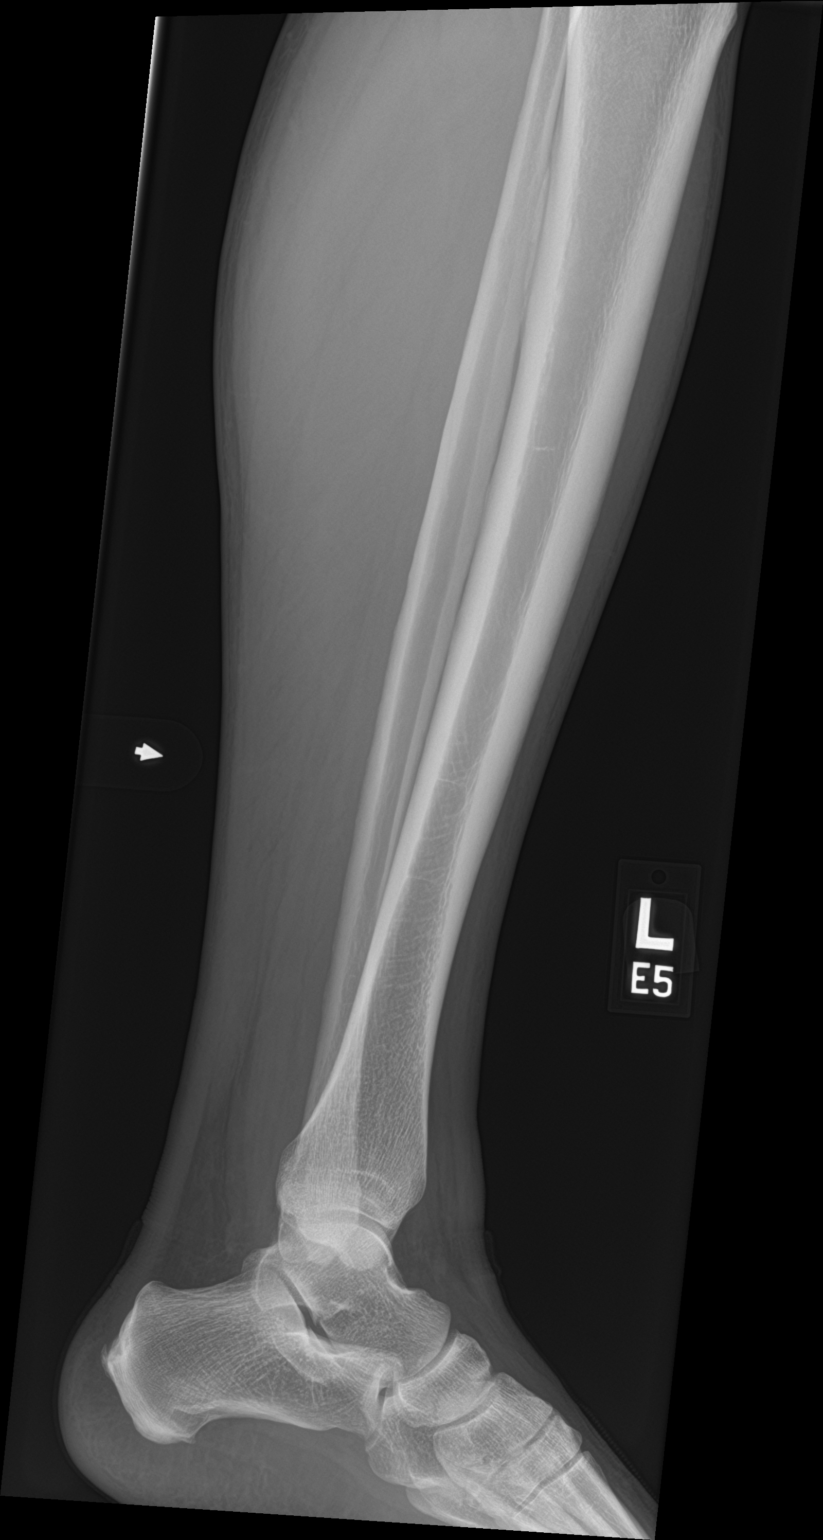

[tibia lat (2 of 2)]
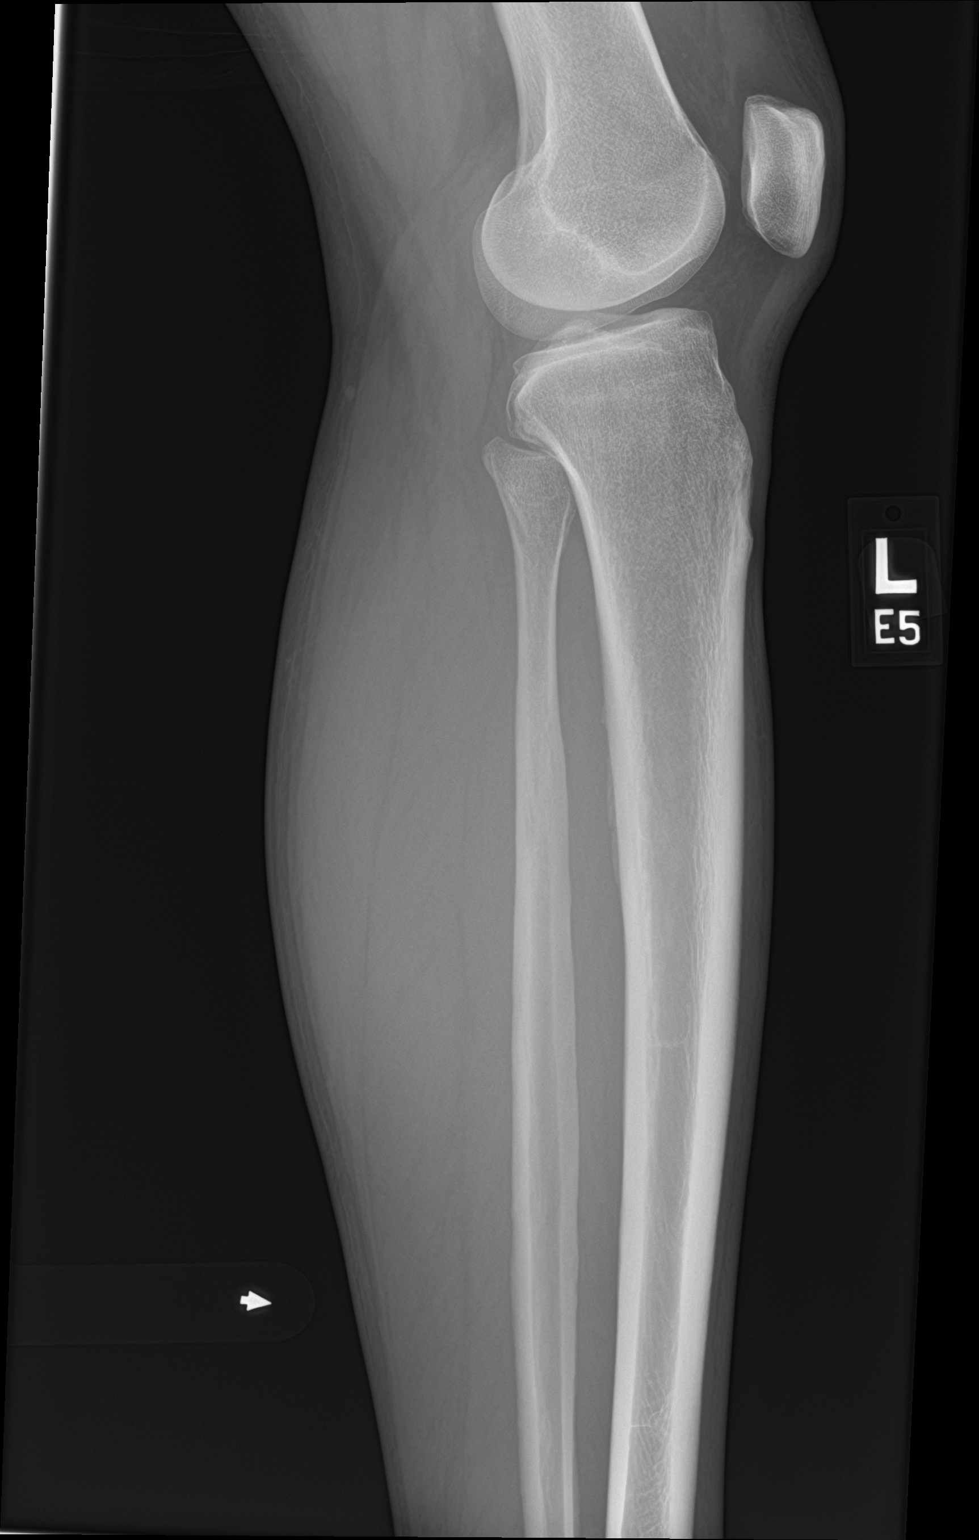

[4 of 4 positions shown; findings below may reference images not displayed]

FINDINGS: There is no evidence of fracture or other focal bone lesions. Soft
tissues are unremarkable.
IMPRESSION: Negative.

## 2023-01-08 MED ORDER — GOSERELIN ACETATE 3.6 MG ~~LOC~~ IMPL
3.6000 mg | DRUG_IMPLANT | SUBCUTANEOUS | Status: DC
  Administered 2023-01-08: 3.6 mg via SUBCUTANEOUS
  Filled 2023-01-08: qty 3.6

## 2023-01-08 NOTE — Progress Notes (Signed)
[facility]       Telephone: [phone]?Fax: [phone]   Oncology Clinical Pharmacist Practitioner Progress Note  Allison Reed was contacted via telephone to discuss her chemotherapy regimen for abemaciclib which they receive under the care of Dr. [name].  I connected with Allison Reed today by telephone and verified that I was speaking with the correct person using two patient identifiers.   Other persons participating in the visit and their role in the encounter: none   Patient's location: home  Provider's location: clinic  Current treatment regimen and start date Abemaciclib (12/13/22) Letrozole (07/16/22) Goserelin (03/14/22)  Interval History She continues on abemaciclib 100 mg by mouth every 12 hours on days 1 to 28 of a 28-day cycle. This is being given in combination with letrozole and goserelin . Therapy is planned to continue until  two years in the adjuvant setting per the monarchE trial data .  Clinical pharmacy visited with Allison Reed via telephone today as a follow-up to her abemaciclib management.  She last saw clinical pharmacy on 12/25/22 and Dr. Lindi Adie on 09/11/22.  She had a work meeting this morning and was unable to see clinical pharmacy in person.  She was able to get her goserelin injection today and labs.  Response to Therapy Overall, Allison Reed is doing well.  She reports having 3 loose stools a day but she states that it is manageable and she is not using loperamide at this time, although she does have it at home if needed.  She does state that she stopped abemaciclib on 01/04/23 because she had chills and was not feeling well.  She had contacted Dr. Geralyn Flash office and they told her to hold abemaciclib until tomorrow.  She will restart abemaciclib on 01/09/23.  Her hemoglobin today is slightly increased at 15.3 g/dL but this is down from 15.9 g/dL at her last visit.  Her [place] has also decreased to approximately 1300 cells/L today.  No vitals were  done today as it was a telephone visit clinical pharmacy.  She prefers to have visits as early as possible and so clinical pharmacy will see her again in 2 weeks at 9 AM with labs prior.  And she is due for goserelin in 4 weeks, she will have labs, followed by goserelin, followed by her visit.  We did discuss that her goserelin will be due in 4 weeks and then again in 8 weeks which would be 03/05/23 however she will be out of town on this date and requested that her goserelin be given 1 week later on 03/12/23.  This has been scheduled and she will also see Dr. Lindi Adie with labs on this date.  Labs, vitals, treatment parameters, and manufacturer guidelines assessing toxicity were reviewed with Allison Reed today. Based on these values, patient is in agreement to continue abemaciclib therapy at this time.  Allergies Allergies  Allergen Reactions   Betadine [Povidone Iodine] Rash   Tape Rash    Vitals    01/08/2023    8:17 AM 12/25/2022    1:12 PM 12/24/2022    3:54 PM  Oncology Vitals  Height  170 cm   Weight  70.806 kg 70.534 kg  Weight (lbs)  156 lbs 2 oz 155 lbs 8 oz  BMI  24.45 kg/m2   24.45 kg/m2 24.35 kg/m2   24.35 kg/m2  Temp 98.5 F (36.9 C) 97.4 F (36.3 C)   Pulse Rate 87 92   BP 100/75  115/79   Resp 16 18   SpO2 99 % 100 %   BSA (m2)  1.83 m2   1.83 m2 1.83 m2   1.83 m2    Laboratory Data    Latest Ref Rng & Units 01/08/2023    7:56 AM 12/25/2022   12:36 PM 09/11/2022    2:56 PM  CBC EXTENDED  WBC 4.0 - 10.5 K/uL 3.0  4.2  4.8   RBC 3.87 - 5.11 MIL/uL 5.57  5.79  5.41   Hemoglobin 12.0 - 15.0 g/dL 15.3  15.9  14.6   HCT 36.0 - 46.0 % 43.6  45.7  41.7   Platelets 150 - 400 K/uL 221  192  202   NEUT# 1.7 - 7.7 K/uL 1.3  2.0  2.6   Lymph# 0.7 - 4.0 K/uL 1.3  1.8  1.7        Latest Ref Rng & Units 01/08/2023    7:56 AM 12/25/2022   12:36 PM 09/11/2022    2:56 PM  CMP  Glucose 70 - 99 mg/dL 178  163  239   BUN 6 - 20 mg/dL '12  11  15   '$ Creatinine 0.44 - 1.00 mg/dL  0.77  0.73  0.59   Sodium 135 - 145 mmol/L 139  139  137   Potassium 3.5 - 5.1 mmol/L 4.4  4.0  3.9   Chloride 98 - 111 mmol/L 101  102  99   CO2 22 - 32 mmol/L '28  30  29   '$ Calcium 8.9 - 10.3 mg/dL 9.8  10.1  9.9   Total Protein 6.5 - 8.1 g/dL 7.6  7.2  6.7   Total Bilirubin 0.3 - 1.2 mg/dL 0.4  0.4  0.4   Alkaline Phos 38 - 126 U/L 58  66  63   AST 15 - 41 U/L '24  25  21   '$ ALT 0 - 44 U/L '23  30  22     '$ Adverse Effects Assessment Hemoglobin: Slightly increased from the upper limit of normal.  However, it is decreased from her last visit.  As above, will monitor. ANC: As above, decreased to approximately 1300 cells per microliter.  Will hold if grade 3 toxicities per manufacturing guidelines  Adherence Assessment Allison Reed reports missing 10 doses over the past 1 weeks.  As above, this was due to Dr. Lindi Adie instructing Allison Reed to stop abemaciclib last Friday since she was not feeling well.  She will restart tomorrow.  She did not report fevers greater than or equal to 100.4 F Reason for missed dose: Not feeling well Patient was re-educated on importance of adherence.   Access Assessment Allison Reed is currently receiving her abemaciclib through [facility] concerns: None, explained to Ms. Luster that if she gets to 1 week left of abemaciclib and has not heard from [facility] that she should contact them.  She reports that she does have the phone number for them.  Medication Reconciliation The patient's medication list was reviewed today with the patient?  Yes New medications or herbal supplements have recently been started?  No Any medications have been discontinued?  No The medication list was updated and reconciled based on the patient's most recent medication list in the electronic medical record (EMR) including herbal products and OTC medications.   Medications Current Outpatient Medications  Medication Sig Dispense Refill   abemaciclib  (VERZENIO) 100 MG tablet Take 1 tablet (100 mg total) by  mouth 2 (two) times daily. 60 tablet 3   Empagliflozin-metFORMIN HCl ER (SYNJARDY XR) 09-999 MG TB24 Take by mouth.     ergocalciferol (VITAMIN D2) 1.25 MG (50000 UT) capsule Take 1 capsule (50,000 Units total) by mouth once a week. 12 capsule 0   letrozole (FEMARA) 2.5 MG tablet Take 1 tablet (2.5 mg total) by mouth daily. 90 tablet 3   Multiple Vitamins-Minerals (WOMENS DAILY FORMULA PO) Take by mouth daily.     ONETOUCH VERIO test strip 1 each by Other route 2 (two) times daily.     TRADJENTA 5 MG TABS tablet Take 5 mg by mouth daily.     No current facility-administered medications for this visit.   Facility-Administered Medications Ordered in Other Visits  Medication Dose Route Frequency Provider Last Rate Last Admin   goserelin (ZOLADEX) injection 3.6 mg  3.6 mg Subcutaneous Q28 days Gardenia Phlegm, NP   3.6 mg at 01/08/23 0820    Drug-Drug Interactions (DDIs) DDIs were evaluated?  Yes Significant DDIs?  No The patient was instructed to speak with their health care provider and/or the oral chemotherapy pharmacist before starting any new drug, including prescription or over the counter, natural / herbal products, or vitamins.  Supportive Care Diarrhea: we reviewed that diarrhea is common with abemaciclib and confirmed that she does have loperamide (Imodium) at home.  We reviewed how to take this medication PRN. Neutropenia: we discussed the importance of having a thermometer and what the Centers for Disease Control and Prevention (CDC) considers a fever which is 100.70F (38C) or higher.  Gave patient 24/7 triage line to call if any fevers or symptoms. ILD/Pneumonitis: we reviewed potential symptoms including cough, shortness, and fatigue.  VTE: reviewed signs of DVT such as leg swelling, redness, pain, or tenderness and signs of PE such as shortness of breath, rapid or irregular heartbeat, cough, chest pain, or  lightheadedness. Reviewed to take the medication every 12 hours (with food sometimes can be easier on the stomach) and to take it at the same time every day.  Dosing Assessment Hepatic adjustments needed?  No Renal adjustments needed?  No Toxicity adjustments needed?  No The current dosing regimen is appropriate to continue at this time.  Follow-Up Plan Continue abemaciclib 100 mg by mouth every 12 hours.  She will restart abemaciclib tomorrow after holding it since 01/04/23 Continue letrozole 2.5 mg by mouth daily Continue goserelin 3.6 mg subcutaneously every 28 days.  Dose was given today, next dose due 02/05/23 Continue to monitor ANC and hemoglobin Use loperamide as needed for diarrhea.  Reporting 3 loose stools a day currently but not using loperamide at this time.  She does have it on hand. Will add labs, pharmacy clinic visit, on 01/22/23 and 02/05/2023.  Patient needs to have 9 AM appointments.  On her 02/05/2023 visit, she will have goserelin and labs prior to her visit with clinical pharmacy per her request.  If she continues to tolerate abemaciclib, can likely go to monthly labs after the 02/05/2023 visit x 2 months Labs, Dr. Lindi Adie visit, goserelin injection on 03/12/23.  This will be 1 week late as she will be out of town on 03/05/2023.  Allison Reed participated in the discussion, expressed understanding, and voiced agreement with the above plan. All questions were answered to her satisfaction. The patient was advised to contact the clinic at [phone] with any questions or concerns prior to her return visit.   I spent 30 minutes assessing and educating  the patient.  Raina Mina, RPH-CPP, 01/08/2023  10:12 AM   **Disclaimer: This note was dictated with voice recognition software. Similar sounding words can inadvertently be transcribed and this note may contain transcription errors which may not have been corrected upon publication of note.**

## 2023-01-10 ENCOUNTER — Telehealth: Payer: Self-pay | Admitting: Pharmacist

## 2023-01-10 NOTE — Telephone Encounter (Signed)
Scheduled appointments per 1/30 los. Left voicemail.

## 2023-01-14 IMAGING — US US EXTREM LOW*L* LIMITED
1 series · 10 of 10 positions shown · non-contrast
Comparison: Radiograph 02/02/2022

CLINICAL DATA: Soft tissue mass medial to the tibia.

EXAM:
ULTRASOUND LEFT LOWER EXTREMITY LIMITED
TECHNIQUE: Ultrasound examination of the lower extremity soft tissues was
performed in the area of clinical concern.

[Series 1: us extrem low*left* limited · 10 of 10 slices shown]
[im 1/10]
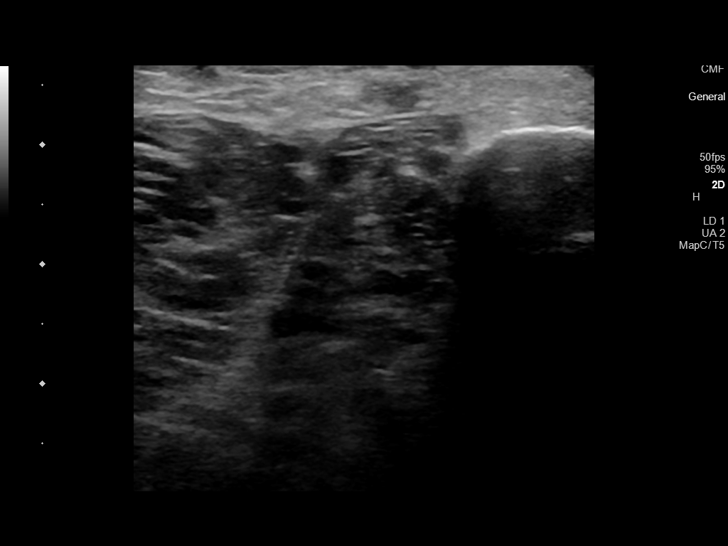
[im 2/10]
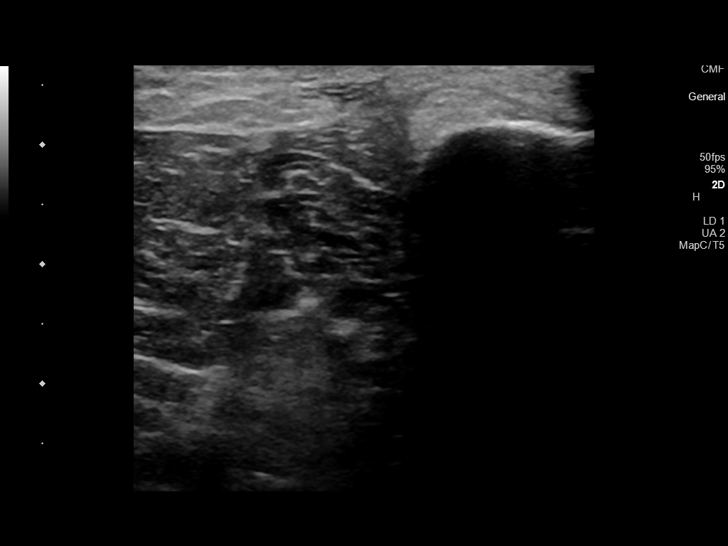
[im 3/10]
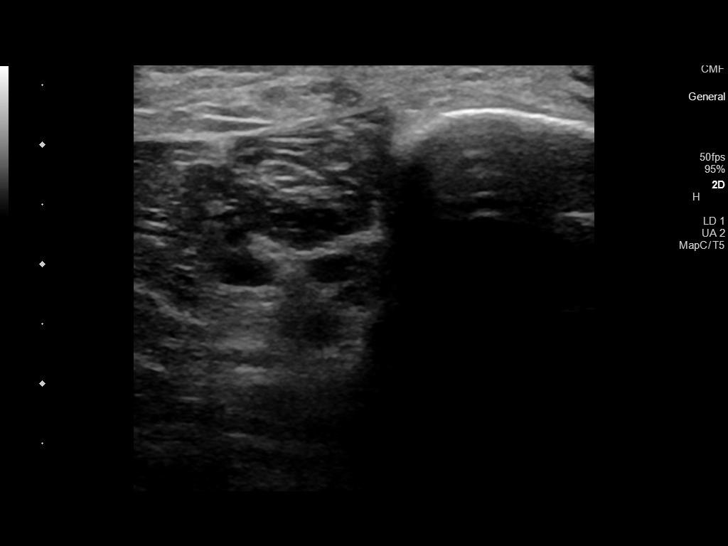
[im 4/10]
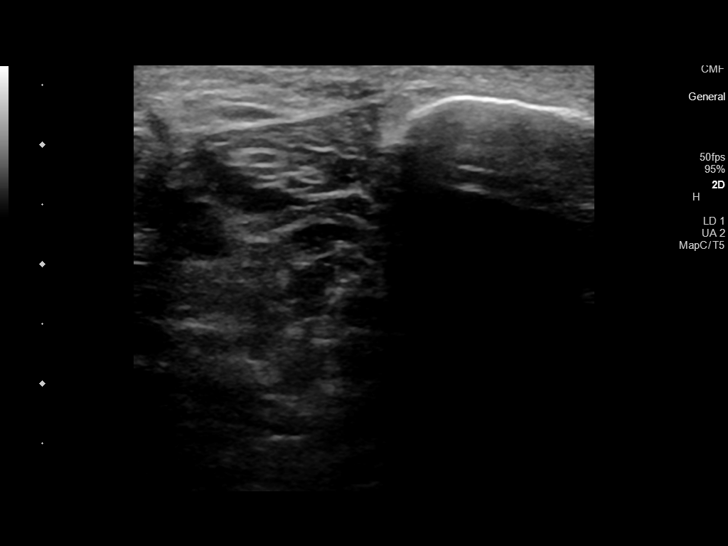
[im 5/10]
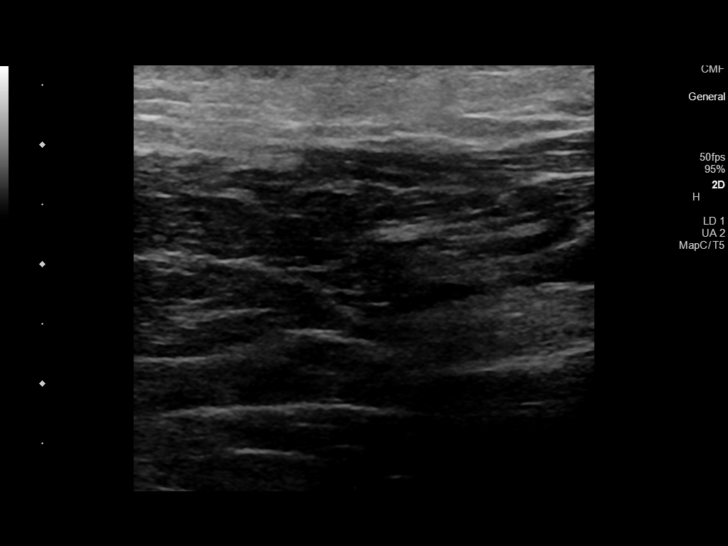
[im 6/10]
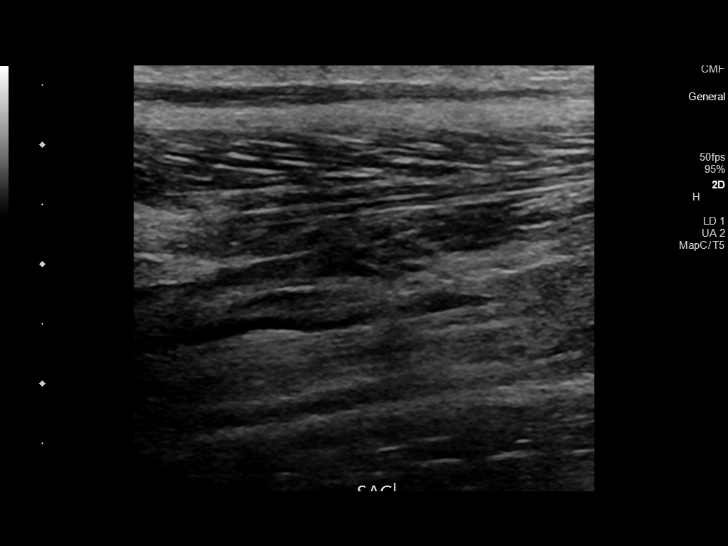
[im 7/10]
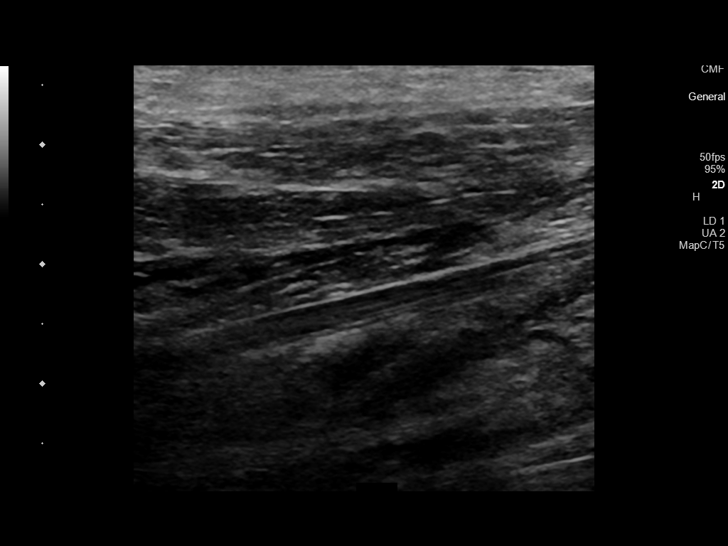
[im 8/10]
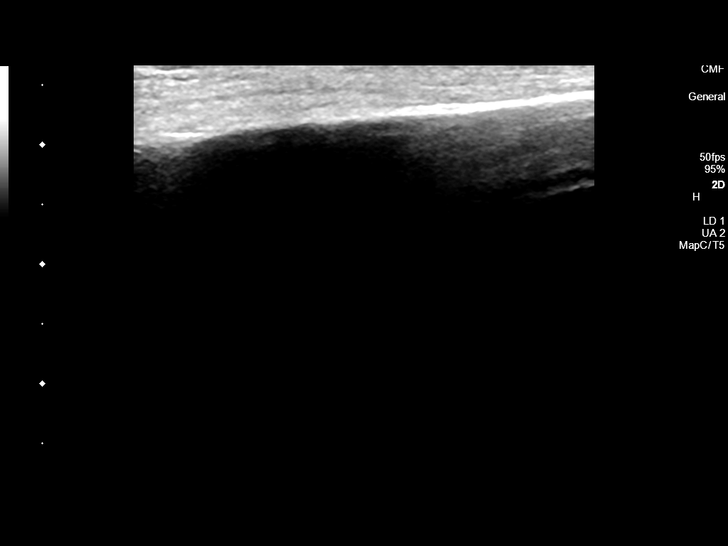
[im 9/10]
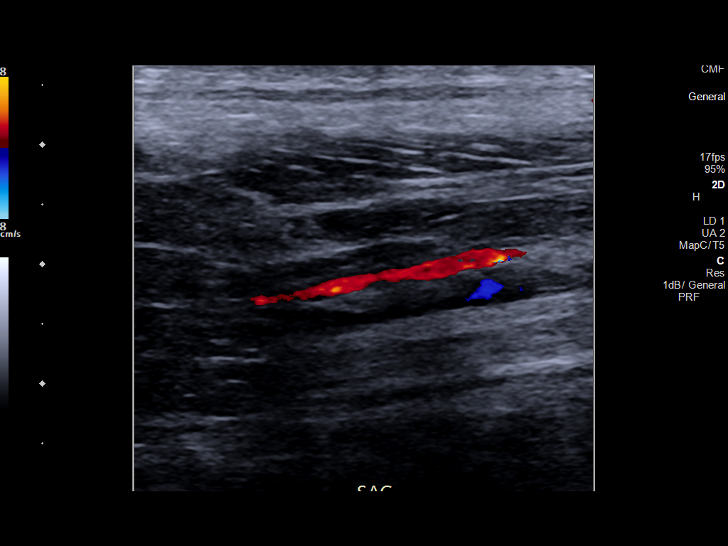
[im 10/10]
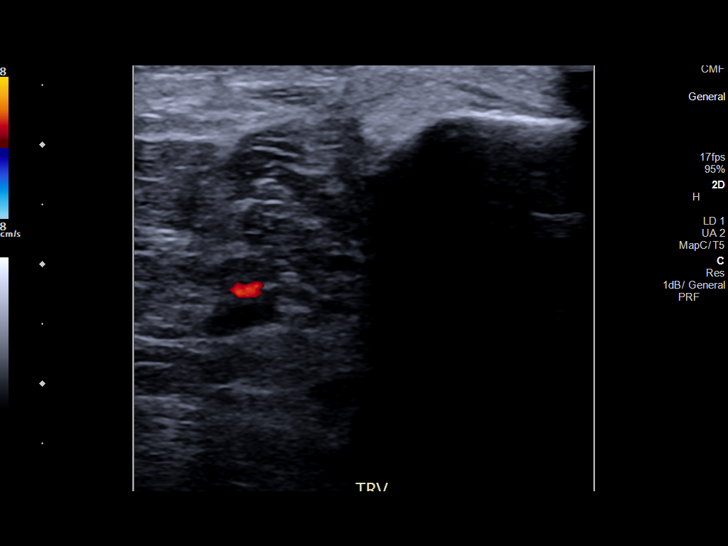

[10 of 10 positions shown; findings below may reference images not displayed]

FINDINGS: Targeted sonographic evaluation in the area of clinical concern.
Homogeneous appearance of the visualized soft tissues. No soft
tissue mass or focal fluid collection. No abnormal vascularity.
IMPRESSION: No sonographic evidence of soft tissue mass or abnormality in the
area of clinical concern.

## 2023-01-22 ENCOUNTER — Inpatient Hospital Stay: Payer: 59 | Admitting: Pharmacist

## 2023-01-22 ENCOUNTER — Inpatient Hospital Stay: Payer: 59 | Attending: Hematology and Oncology

## 2023-01-22 DIAGNOSIS — Z170 Estrogen receptor positive status [ER+]: Secondary | ICD-10-CM | POA: Diagnosis not present

## 2023-01-22 DIAGNOSIS — C50811 Malignant neoplasm of overlapping sites of right female breast: Secondary | ICD-10-CM | POA: Insufficient documentation

## 2023-01-22 DIAGNOSIS — Z5111 Encounter for antineoplastic chemotherapy: Secondary | ICD-10-CM | POA: Diagnosis present

## 2023-01-22 DIAGNOSIS — Z79811 Long term (current) use of aromatase inhibitors: Secondary | ICD-10-CM | POA: Insufficient documentation

## 2023-01-22 LAB — CBC WITH DIFFERENTIAL (CANCER CENTER ONLY)
Abs Immature Granulocytes: 0.01 10*3/uL (ref 0.00–0.07)
Basophils Absolute: 0 10*3/uL (ref 0.0–0.1)
Basophils Relative: 1 %
Eosinophils Absolute: 0.1 10*3/uL (ref 0.0–0.5)
Eosinophils Relative: 2 %
HCT: 43.6 % (ref 36.0–46.0)
Hemoglobin: 15.1 g/dL — ABNORMAL HIGH (ref 12.0–15.0)
Immature Granulocytes: 0 %
Lymphocytes Relative: 48 %
Lymphs Abs: 1.7 10*3/uL (ref 0.7–4.0)
MCH: 27.3 pg (ref 26.0–34.0)
MCHC: 34.6 g/dL (ref 30.0–36.0)
MCV: 78.7 fL — ABNORMAL LOW (ref 80.0–100.0)
Monocytes Absolute: 0.2 10*3/uL (ref 0.1–1.0)
Monocytes Relative: 6 %
Neutro Abs: 1.5 10*3/uL — ABNORMAL LOW (ref 1.7–7.7)
Neutrophils Relative %: 43 %
Platelet Count: 198 10*3/uL (ref 150–400)
RBC: 5.54 MIL/uL — ABNORMAL HIGH (ref 3.87–5.11)
RDW: 15.2 % (ref 11.5–15.5)
WBC Count: 3.4 10*3/uL — ABNORMAL LOW (ref 4.0–10.5)
nRBC: 0 % (ref 0.0–0.2)

## 2023-01-22 LAB — CMP (CANCER CENTER ONLY)
ALT: 22 U/L (ref 0–44)
AST: 19 U/L (ref 15–41)
Albumin: 4.5 g/dL (ref 3.5–5.0)
Alkaline Phosphatase: 60 U/L (ref 38–126)
Anion gap: 9 (ref 5–15)
BUN: 9 mg/dL (ref 6–20)
CO2: 29 mmol/L (ref 22–32)
Calcium: 9.9 mg/dL (ref 8.9–10.3)
Chloride: 100 mmol/L (ref 98–111)
Creatinine: 0.78 mg/dL (ref 0.44–1.00)
GFR, Estimated: >60 mL/min (ref >60)
Glucose, Bld: 161 mg/dL — ABNORMAL HIGH (ref 70–99)
Potassium: 4.4 mmol/L (ref 3.5–5.1)
Sodium: 138 mmol/L (ref 135–145)
Total Bilirubin: 0.5 mg/dL (ref 0.3–1.2)
Total Protein: 7.1 g/dL (ref 6.5–8.1)

## 2023-01-22 NOTE — Progress Notes (Signed)
[place]       Telephone: [phone]?Fax: [phone]   Oncology Clinical Pharmacist Practitioner Progress Note  I connected with Allison Reed today by video and verified that I was speaking with the correct person using two patient identifiers.   I discussed the limitations, risks, security and privacy concerns of performing an evaluation and management service by telemedicine and the availability of in-person appointments. I also discussed with the patient that there may be a patient responsible charge related to this service. The patient expressed understanding and agreed to proceed.   Other persons participating in the visit and their role in the encounter: none   Patient's location: home  Provider's location: clinic    Current treatment regimen and start date Abemaciclib (12/13/22) Letrozole (07/16/22) Goserelin (03/14/22)   Interval History She continues on abemaciclib 100 mg by mouth every 12 hours on days 1 to 28 of a 28-day cycle. This is being given in combination with letrozole and goserelin . Therapy is planned to continue until  two years in the adjuvant setting per the monarchE trial data .  She last visited with clinical pharmacy on 01/08/23 and Dr. Lindi Adie on 09/11/22.  She is next due for goserelin on 02/05/23 which has been scheduled. She prefers not to see clinical pharmacy on this date and will instead see Dr. Lindi Adie on 03/12/23. We discussed the importance of the clinical visits to assess toxicity but she prefers to just see Dr. Lindi Adie on 03/12/23 instead. We will update these appointments.  Response to Therapy Overall, she is tolerating abemaciclib very well. She did not report any side effects at our visit today. She will consider going up on the abemaciclib dose when she sees Dr. Lindi Adie in April. Labs, vitals, treatment parameters, and manufacturer guidelines assessing toxicity were reviewed with Allison Reed today. Based on these values, patient is in  agreement to continue abemaciclib therapy at this time.  Allergies Allergies  Allergen Reactions   Betadine [Povidone Iodine] Rash   Tape Rash    Vitals = she did not wait to have vitals done this morning 01/22/23    01/08/2023    8:17 AM 12/25/2022    1:12 PM 12/24/2022    3:54 PM  Oncology Vitals  Height  170 cm   Weight  70.806 kg 70.534 kg  Weight (lbs)  156 lbs 2 oz 155 lbs 8 oz  BMI  24.45 kg/m2   24.45 kg/m2 24.35 kg/m2   24.35 kg/m2  Temp 98.5 F (36.9 C) 97.4 F (36.3 C)   Pulse Rate 87 92   BP 100/75 115/79   Resp 16 18   SpO2 99 % 100 %   BSA (m2)  1.83 m2   1.83 m2 1.83 m2   1.83 m2    Laboratory Data    Latest Ref Rng & Units 01/22/2023    8:00 AM 01/08/2023    7:56 AM 12/25/2022   12:36 PM  CBC EXTENDED  WBC 4.0 - 10.5 K/uL 3.4  3.0  4.2   RBC 3.87 - 5.11 MIL/uL 5.54  5.57  5.79   Hemoglobin 12.0 - 15.0 g/dL 15.1  15.3  15.9   HCT 36.0 - 46.0 % 43.6  43.6  45.7   Platelets 150 - 400 K/uL 198  221  192   NEUT# 1.7 - 7.7 K/uL 1.5  1.3  2.0   Lymph# 0.7 - 4.0 K/uL 1.7  1.3  1.8  Latest Ref Rng & Units 01/22/2023    8:00 AM 01/08/2023    7:56 AM 12/25/2022   12:36 PM  CMP  Glucose 70 - 99 mg/dL 161  178  163   BUN 6 - 20 mg/dL 9  12  11   $ Creatinine 0.44 - 1.00 mg/dL 0.78  0.77  0.73   Sodium 135 - 145 mmol/L 138  139  139   Potassium 3.5 - 5.1 mmol/L 4.4  4.4  4.0   Chloride 98 - 111 mmol/L 100  101  102   CO2 22 - 32 mmol/L 29  28  30   $ Calcium 8.9 - 10.3 mg/dL 9.9  9.8  10.1   Total Protein 6.5 - 8.1 g/dL 7.1  7.6  7.2   Total Bilirubin 0.3 - 1.2 mg/dL 0.5  0.4  0.4   Alkaline Phos 38 - 126 U/L 60  58  66   AST 15 - 41 U/L 19  24  25   $ ALT 0 - 44 U/L 22  23  30     $ Adverse Effects Assessment None reported  Adherence Assessment Allison Reed reports missing 0 doses over the past 2 weeks.   Reason for missed dose: n/a Patient was re-educated on importance of adherence.   Access Assessment Allison Reed is currently receiving her  abemaciclib through [employer] concerns:  none  Medication Reconciliation The patient's medication list was reviewed today with the patient? Yes New medications or herbal supplements have recently been started? No  Any medications have been discontinued? No  The medication list was updated and reconciled based on the patient's most recent medication list in the electronic medical record (EMR) including herbal products and OTC medications.   Medications Current Outpatient Medications  Medication Sig Dispense Refill   abemaciclib (VERZENIO) 100 MG tablet Take 1 tablet (100 mg total) by mouth 2 (two) times daily. 60 tablet 3   Empagliflozin-metFORMIN HCl ER (SYNJARDY XR) 09-999 MG TB24 Take by mouth.     ergocalciferol (VITAMIN D2) 1.25 MG (50000 UT) capsule Take 1 capsule (50,000 Units total) by mouth once a week. 12 capsule 0   letrozole (FEMARA) 2.5 MG tablet Take 1 tablet (2.5 mg total) by mouth daily. 90 tablet 3   Multiple Vitamins-Minerals (WOMENS DAILY FORMULA PO) Take by mouth daily.     ONETOUCH VERIO test strip 1 each by Other route 2 (two) times daily.     TRADJENTA 5 MG TABS tablet Take 5 mg by mouth daily.     No current facility-administered medications for this visit.    Drug-Drug Interactions (DDIs) DDIs were evaluated? Yes Significant DDIs? No  The patient was instructed to speak with their health care provider and/or the oral chemotherapy pharmacist before starting any new drug, including prescription or over the counter, natural / herbal products, or vitamins.  Supportive Care Diarrhea: we reviewed that diarrhea is common with abemaciclib and confirmed that she does have loperamide (Imodium) at home.  We reviewed how to take this medication PRN. Neutropenia: we discussed the importance of having a thermometer and what the Centers for Disease Control and Prevention (CDC) considers a fever which is 100.2F (38C) or higher.  Gave patient 24/7  triage line to call if any fevers or symptoms. ILD/Pneumonitis: we reviewed potential symptoms including cough, shortness, and fatigue.  VTE: reviewed signs of DVT such as leg swelling, redness, pain, or tenderness and signs of PE such as shortness of breath,  rapid or irregular heartbeat, cough, chest pain, or lightheadedness. Reviewed to take the medication every 12 hours (with food sometimes can be easier on the stomach) and to take it at the same time every day.  Dosing Assessment Hepatic adjustments needed? No  Renal adjustments needed? No  Toxicity adjustments needed? No  The current dosing regimen is appropriate to continue at this time.  Follow-Up Plan Continue abemaciclib 100 mg by mouth every 12 hours.  She will discuss increasing to 150 mg BID at Dr. Geralyn Flash visit on 03/12/23 Continue letrozole 2.5 mg by mouth daily Continue goserelin 3.6 mg subcutaneously every 28 days. Next dose due 02/05/23 Use loperamide as needed for diarrhea.   Per patient request, will remove pharmacy clinic visit on 02/05/23. She will follow up with Dr. Lindi Adie on 03/12/23 Labs, Dr. Lindi Adie visit, goserelin injection on 03/12/23.  This will be 1 week late as she will be out of town on 03/05/2023. She can see clinical pharmacy as needed going forward per her request.  Allison Reed participated in the discussion, expressed understanding, and voiced agreement with the above plan. All questions were answered to her satisfaction. The patient was advised to contact the clinic at [phone] with any questions or concerns prior to her return visit.   I spent 30 minutes assessing and educating the patient.  Raina Mina, RPH-CPP, 01/22/2023  9:57 AM   **Disclaimer: This note was dictated with voice recognition software. Similar sounding words can inadvertently be transcribed and this note may contain transcription errors which may not have been corrected upon publication of note.**

## 2023-01-23 ENCOUNTER — Inpatient Hospital Stay: Payer: 59

## 2023-01-23 ENCOUNTER — Inpatient Hospital Stay: Payer: 59 | Admitting: Pharmacist

## 2023-01-30 ENCOUNTER — Inpatient Hospital Stay: Payer: 59

## 2023-02-04 ENCOUNTER — Encounter: Payer: Self-pay | Admitting: Hematology and Oncology

## 2023-02-05 ENCOUNTER — Inpatient Hospital Stay: Payer: 59

## 2023-02-05 ENCOUNTER — Ambulatory Visit
Admission: RE | Admit: 2023-02-05 | Discharge: 2023-02-05 | Disposition: A | Discharge status: Home / Self Care | Payer: 59 | Source: Ambulatory Visit | Attending: Adult Health | Admitting: Adult Health

## 2023-02-05 ENCOUNTER — Inpatient Hospital Stay: Payer: 59 | Admitting: Pharmacist

## 2023-02-05 VITALS — BP 96/74 | HR 86 | Temp 98.1°F | Resp 16

## 2023-02-05 DIAGNOSIS — C50811 Malignant neoplasm of overlapping sites of right female breast: Secondary | ICD-10-CM

## 2023-02-05 DIAGNOSIS — E2839 Other primary ovarian failure: Secondary | ICD-10-CM

## 2023-02-05 DIAGNOSIS — Z5111 Encounter for antineoplastic chemotherapy: Secondary | ICD-10-CM | POA: Diagnosis not present

## 2023-02-05 LAB — CMP (CANCER CENTER ONLY)
ALT: 26 U/L (ref 0–44)
AST: 24 U/L (ref 15–41)
Albumin: 4.6 g/dL (ref 3.5–5.0)
Alkaline Phosphatase: 49 U/L (ref 38–126)
Anion gap: 9 (ref 5–15)
BUN: 11 mg/dL (ref 6–20)
CO2: 30 mmol/L (ref 22–32)
Calcium: 9.4 mg/dL (ref 8.9–10.3)
Chloride: 99 mmol/L (ref 98–111)
Creatinine: 0.74 mg/dL (ref 0.44–1.00)
GFR, Estimated: >60 mL/min (ref >60)
Glucose, Bld: 156 mg/dL — ABNORMAL HIGH (ref 70–99)
Potassium: 4.3 mmol/L (ref 3.5–5.1)
Sodium: 138 mmol/L (ref 135–145)
Total Bilirubin: 0.6 mg/dL (ref 0.3–1.2)
Total Protein: 7.3 g/dL (ref 6.5–8.1)

## 2023-02-05 LAB — CBC WITH DIFFERENTIAL (CANCER CENTER ONLY)
Abs Immature Granulocytes: 0 10*3/uL (ref 0.00–0.07)
Basophils Absolute: 0 10*3/uL (ref 0.0–0.1)
Basophils Relative: 1 %
Eosinophils Absolute: 0.1 10*3/uL (ref 0.0–0.5)
Eosinophils Relative: 2 %
HCT: 43.1 % (ref 36.0–46.0)
Hemoglobin: 15 g/dL (ref 12.0–15.0)
Immature Granulocytes: 0 %
Lymphocytes Relative: 43 %
Lymphs Abs: 1.2 10*3/uL (ref 0.7–4.0)
MCH: 28.1 pg (ref 26.0–34.0)
MCHC: 34.8 g/dL (ref 30.0–36.0)
MCV: 80.7 fL (ref 80.0–100.0)
Monocytes Absolute: 0.2 10*3/uL (ref 0.1–1.0)
Monocytes Relative: 7 %
Neutro Abs: 1.3 10*3/uL — ABNORMAL LOW (ref 1.7–7.7)
Neutrophils Relative %: 47 %
Platelet Count: 189 10*3/uL (ref 150–400)
RBC: 5.34 MIL/uL — ABNORMAL HIGH (ref 3.87–5.11)
RDW: 16.2 % — ABNORMAL HIGH (ref 11.5–15.5)
WBC Count: 2.9 10*3/uL — ABNORMAL LOW (ref 4.0–10.5)
nRBC: 0 % (ref 0.0–0.2)

## 2023-02-05 MED ORDER — GOSERELIN ACETATE 3.6 MG ~~LOC~~ IMPL
3.6000 mg | DRUG_IMPLANT | SUBCUTANEOUS | Status: DC
  Administered 2023-02-05: 3.6 mg via SUBCUTANEOUS
  Filled 2023-02-05: qty 3.6

## 2023-02-05 NOTE — Patient Instructions (Signed)
Goserelin Implant What is this medication? GOSERELIN (GOE se rel in) treats prostate cancer and breast cancer. It works by decreasing levels of the hormones testosterone and estrogen in the body. This prevents prostate and breast cancer cells from spreading or growing. It may also be used to treat endometriosis. This is a condition where the tissue that lines the uterus grows outside the uterus. It works by decreasing the amount of estrogen your body makes, which reduces heavy bleeding and pain. It can also be used to help thin the lining of the uterus before a surgery used to prevent or reduce heavy periods. This medicine may be used for other purposes; ask your health care provider or pharmacist if you have questions. COMMON BRAND NAME(S): Zoladex, Zoladex 3-Month What should I tell my care team before I take this medication? They need to know if you have any of these conditions: Bone problems Diabetes Heart disease History of irregular heartbeat or rhythm An unusual or allergic reaction to goserelin, other medications, foods, dyes, or preservatives Pregnant or trying to get pregnant Breastfeeding How should I use this medication? This medication is injected under the skin. It is given by your care team in a hospital or clinic setting. Talk to your care team about the use of this medication in children. Special care may be needed. Overdosage: If you think you have taken too much of this medicine contact a poison control center or emergency room at once. NOTE: This medicine is only for you. Do not share this medicine with others. What if I miss a dose? Keep appointments for follow-up doses. It is important not to miss your dose. Call your care team if you are unable to keep an appointment. What may interact with this medication? Do not take this medication with any of the following: Cisapride Dronedarone Pimozide Thioridazine This medication may also interact with the following: Other  medications that cause heart rhythm changes This list may not describe all possible interactions. Give your health care provider a list of all the medicines, herbs, non-prescription drugs, or dietary supplements you use. Also tell them if you smoke, drink alcohol, or use illegal drugs. Some items may interact with your medicine. What should I watch for while using this medication? Visit your care team for regular checks on your progress. Your symptoms may appear to get worse during the first weeks of this therapy. Tell your care team if your symptoms do not start to get better or if they get worse after this time. Using this medication for a long time may weaken your bones. If you smoke or frequently drink alcohol you may increase your risk of bone loss. A family history of osteoporosis, chronic use of medications for seizures (convulsions), or corticosteroids can also increase your risk of bone loss. The risk of bone fractures may be increased. Talk to your care team about your bone health. This medication may increase blood sugar. The risk may be higher in patients who already have diabetes. Ask your care team what you can do to lower your risk of diabetes while taking this medication. This medication should stop regular monthly menstruation in women. Tell your care team if you continue to menstruate. Talk to your care team if you wish to become pregnant or think you might be pregnant. This medication can cause serious birth defects if taken during pregnancy or for 12 weeks after stopping treatment. Talk to your care team about reliable forms of contraception. Do not breastfeed while taking this   medication. This medication may cause infertility. Talk to your care team if you are concerned about your fertility. What side effects may I notice from receiving this medication? Side effects that you should report to your care team as soon as possible: Allergic reactions--skin rash, itching, hives, swelling  of the face, lips, tongue, or throat Change in the amount of urine Heart attack--pain or tightness in the chest, shoulders, arms, or jaw, nausea, shortness of breath, cold or clammy skin, feeling faint or lightheaded Heart rhythm changes--fast or irregular heartbeat, dizziness, feeling faint or lightheaded, chest pain, trouble breathing High blood sugar (hyperglycemia)--increased thirst or amount of urine, unusual weakness or fatigue, blurry vision High calcium level--increased thirst or amount of urine, nausea, vomiting, confusion, unusual weakness or fatigue, bone pain Pain, redness, irritation, or bruising at the injection site Severe back pain, numbness or weakness of the hands, arms, legs, or feet, loss of coordination, loss of bowel or bladder control Stroke--sudden numbness or weakness of the face, arm, or leg, trouble speaking, confusion, trouble walking, loss of balance or coordination, dizziness, severe headache, change in vision Swelling and pain of the tumor site or lymph nodes Trouble passing urine Side effects that usually do not require medical attention (report to your care team if they continue or are bothersome): Change in sex drive or performance Headache Hot flashes Rapid or extreme change in emotion or mood Sweating Swelling of the ankles, hands, or feet Unusual vaginal discharge, itching, or odor This list may not describe all possible side effects. Call your doctor for medical advice about side effects. You may report side effects to FDA at 1-800-FDA-1088. Where should I keep my medication? This medication is given in a hospital or clinic. It will not be stored at home. NOTE: This sheet is a summary. It may not cover all possible information. If you have questions about this medicine, talk to your doctor, pharmacist, or health care provider.  2023 Elsevier/Gold Standard (2022-04-11 00:00:00)  

## 2023-02-06 ENCOUNTER — Inpatient Hospital Stay: Payer: 59

## 2023-02-06 ENCOUNTER — Inpatient Hospital Stay: Payer: 59 | Admitting: Pharmacist

## 2023-02-07 ENCOUNTER — Telehealth: Payer: Self-pay

## 2023-02-07 NOTE — Telephone Encounter (Signed)
Called and given below message. Pt verbalized understanding but stated someone called her with these results yesterday. Apologized for the extra call and gave call back number with any additional questions.

## 2023-02-07 NOTE — Telephone Encounter (Signed)
-----   Message from Gardenia Phlegm, NP sent at 02/06/2023  8:39 AM EST ----- Bone Density is normal please let patient know. ----- Message ----- From: Interface, Rad Results In Sent: 02/06/2023   8:14 AM EST To: Gardenia Phlegm, NP

## 2023-02-20 ENCOUNTER — Encounter: Payer: Self-pay | Admitting: Hematology and Oncology

## 2023-02-21 ENCOUNTER — Telehealth: Payer: Self-pay

## 2023-02-21 ENCOUNTER — Other Ambulatory Visit: Payer: Self-pay

## 2023-02-21 MED ORDER — MAGIC MOUTHWASH W/LIDOCAINE: 5.0000 mL | Freq: Four times a day (QID) | ORAL | 1 refills | Status: AC | PRN

## 2023-02-21 NOTE — Telephone Encounter (Signed)
Per MD, pt should stop verzenio and send in Magic mouthwash w/lidocaine. Order entered per MD and pt was made aware. She plans to f/u as scheduled 4/2

## 2023-02-21 NOTE — Telephone Encounter (Signed)
Once again attempted to call pt. Allison Reed for call back.

## 2023-02-25 ENCOUNTER — Encounter: Payer: Self-pay | Admitting: Hematology and Oncology

## 2023-02-25 NOTE — Telephone Encounter (Signed)
error 

## 2023-02-27 ENCOUNTER — Inpatient Hospital Stay: Payer: 59

## 2023-03-05 ENCOUNTER — Inpatient Hospital Stay: Payer: 59

## 2023-03-05 ENCOUNTER — Inpatient Hospital Stay: Payer: 59 | Admitting: Adult Health

## 2023-03-06 NOTE — Progress Notes (Signed)
Patient Care Team: [name], MD as PCP - General (Endocrinology) Eppie Gibson, MD as Attending Physician (Radiation Oncology) Irene Limbo, MD as Consulting Physician (Plastic Surgery) [name], MD as Consulting Physician (Hematology and Oncology) [name], MD as Consulting Physician (General Surgery)  DIAGNOSIS: No diagnosis found.  SUMMARY OF ONCOLOGIC HISTORY: Oncology History  Malignant neoplasm of overlapping sites of right breast in female, estrogen receptor positive ([place])  06/16/2021 Initial Diagnosis   Palpable right breast mass: Breast MRI revealed extensive involvement of the right breast with mass and non-mass enhancement superior right breast mass measures 4.9 x 1.2 cm and together with non-mass enhancement measured 8.8 cm, enhancing mass LOQ 1.3 cm, left breast indeterminate 0.9 cm mass and a 0.8 cm mass UOQ, single abnormal lymph node Biopsy 10:00, 11:00 and 12:00: IDC with DCIS, biopsy right axillary lymph node: IDC, ER 75 to 95%, PR 90 to 95%, Ki-67 25%, HER2 negative on 1 biopsy and 2+ by IHC and FISH pending   06/21/2021 Cancer Staging   Staging form: Breast, AJCC 8th Edition - Clinical stage from 06/21/2021: Stage IIA (cT3, cN1, cM0, G2, ER+, PR+, HER2-) - Signed by [name], MD on 06/21/2021 Histologic grading system: 3 grade system   07/01/2021 Genetic Testing   Negative genetic testing:  No pathogenic variants detected on the [employer] BRCAplus panel (report date 07/01/2021) or the [employer] CancerNext-Expanded + RNAinsight panel (report date 07/01/2021).   The BRCAplus panel offered by [employer] and includes sequencing and deletion/duplication analysis for the following 8 genes: ATM, BRCA1, BRCA2, CDH1, CHEK2, PALB2, PTEN, and TP53. The CancerNext-Expanded + RNAinsight gene panel offered by [employer] and includes sequencing and rearrangement analysis for the following 77 genes: AIP, ALK, APC, ATM, AXIN2, BAP1, BARD1, BLM, BMPR1A, BRCA1,  BRCA2, BRIP1, CDC73, CDH1, CDK4, CDKN1B, CDKN2A, CHEK2, CTNNA1, DICER1, FANCC, FH, FLCN, GALNT12, KIF1B, LZTR1, MAX, MEN1, MET, MLH1, MSH2, MSH3, MSH6, MUTYH, NBN, NF1, NF2, NTHL1, PALB2, PHOX2B, PMS2, POT1, PRKAR1A, PTCH1, PTEN, RAD51C, RAD51D, RB1, RECQL, RET, SDHA, SDHAF2, SDHB, SDHC, SDHD, SMAD4, SMARCA4, SMARCB1, SMARCE1, STK11, SUFU, TMEM127, TP53, TSC1, TSC2, VHL and XRCC2 (sequencing and deletion/duplication); EGFR, EGLN1, HOXB13, KIT, MITF, PDGFRA, POLD1 and POLE (sequencing only); EPCAM and GREM1 (deletion/duplication only). RNA data is routinely analyzed for use in variant interpretation for all genes.   07/25/2021 Surgery   Right mastectomy: Foci of invasive ductal carcinoma measuring 1.1 cm grade 2 with extensive DCIS spanning 8.9 cm, margins negative, 4/7 lymph nodes, ER 75 to 95%, PR 9095%, HER2 negative, Ki-67 25%   07/31/2021 Cancer Staging   Staging form: Breast, AJCC 8th Edition - Pathologic: Stage IB (pT1c, pN2a(sn), cM0, G2, ER+, PR+, HER2-) - Signed by [name], MD on 09/11/2022 Method of lymph node assessment: Sentinel lymph node biopsy Histologic grading system: 3 grade system   08/25/2021 - 01/19/2022 Adjuvant Chemotherapy   Adjuvant chemotherapy: dose dense AC x 4 followed by weekly Taxol x 12.    02/26/2022 - 04/04/2022 Radiation Therapy   Site Technique Total Dose (Gy) Dose per Fx (Gy) Completed Fx Beam Energies  Chest Wall, Right: CW_R_IMN 3D 50.4/50.4 1.8 28/28 6X  Chest Wall, Right: CW_R_PAB_SCV 3D 50.4/50.4 1.8 28/28 6X, 10X     03/14/2022 -  Anti-estrogen oral therapy   Zoladex.  Letrozole started 07/2022.       CHIEF COMPLIANT: Follow-up Letrozole and Verzenio  INTERVAL HISTORY: Allison Reed is a 47 y.o. with the above mentioned. She presents to the clinic for a follow-up  ALLERGIES:  is allergic to betadine [povidone iodine] and tape.  MEDICATIONS:  Current Outpatient Medications  Medication Sig Dispense Refill   magic mouthwash w/lidocaine SOLN  Take 5 mLs by mouth 4 (four) times daily as needed for mouth pain. 240 mL 1   abemaciclib (VERZENIO) 100 MG tablet Take 1 tablet (100 mg total) by mouth 2 (two) times daily. 60 tablet 3   Empagliflozin-metFORMIN HCl ER (SYNJARDY XR) 09-999 MG TB24 Take by mouth.     ergocalciferol (VITAMIN D2) 1.25 MG (50000 UT) capsule Take 1 capsule (50,000 Units total) by mouth once a week. 12 capsule 0   letrozole (FEMARA) 2.5 MG tablet Take 1 tablet (2.5 mg total) by mouth daily. 90 tablet 3   Multiple Vitamins-Minerals (WOMENS DAILY FORMULA PO) Take by mouth daily.     ONETOUCH VERIO test strip 1 each by Other route 2 (two) times daily.     TRADJENTA 5 MG TABS tablet Take 5 mg by mouth daily.     No current facility-administered medications for this visit.    PHYSICAL EXAMINATION: ECOG PERFORMANCE STATUS: {CHL ONC ECOG [id number]}  There were no vitals filed for this visit. There were no vitals filed for this visit.  BREAST:*** No palpable masses or nodules in either right or left breasts. No palpable axillary supraclavicular or infraclavicular adenopathy no breast tenderness or nipple discharge. (exam performed in the presence of a chaperone)  LABORATORY DATA:  I have reviewed the data as listed    Latest Ref Rng & Units 02/05/2023    8:06 AM 01/22/2023    8:00 AM 01/08/2023    7:56 AM  CMP  Glucose 70 - 99 mg/dL 156  161  178   BUN 6 - 20 mg/dL 11  9  12    Creatinine 0.44 - 1.00 mg/dL 0.74  0.78  0.77   Sodium 135 - 145 mmol/L 138  138  139   Potassium 3.5 - 5.1 mmol/L 4.3  4.4  4.4   Chloride 98 - 111 mmol/L 99  100  101   CO2 22 - 32 mmol/L 30  29  28    Calcium 8.9 - 10.3 mg/dL 9.4  9.9  9.8   Total Protein 6.5 - 8.1 g/dL 7.3  7.1  7.6   Total Bilirubin 0.3 - 1.2 mg/dL 0.6  0.5  0.4   Alkaline Phos 38 - 126 U/L 49  60  58   AST 15 - 41 U/L 24  19  24    ALT 0 - 44 U/L 26  22  23      Lab Results  Component Value Date   WBC 2.9 (L) 02/05/2023   HGB 15.0 02/05/2023   HCT 43.1  02/05/2023   MCV 80.7 02/05/2023   PLT 189 02/05/2023   NEUTROABS 1.3 (L) 02/05/2023    ASSESSMENT & PLAN:  No problem-specific Assessment & Plan notes found for this encounter.    No orders of the defined types were placed in this encounter.  The patient has a good understanding of the overall plan. she agrees with it. she will call with any problems that may develop before the next visit here. Total time spent: 30 mins including face to face time and time spent for planning, charting and co-ordination of care   Suzzette Righter, [place] 03/06/23    I Gardiner Coins am acting as a Education administrator for [employer]  ***

## 2023-03-12 ENCOUNTER — Inpatient Hospital Stay: Payer: 59

## 2023-03-12 ENCOUNTER — Inpatient Hospital Stay: Payer: 59 | Attending: Hematology and Oncology

## 2023-03-12 ENCOUNTER — Inpatient Hospital Stay (HOSPITAL_BASED_OUTPATIENT_CLINIC_OR_DEPARTMENT_OTHER): Payer: 59 | Admitting: Hematology and Oncology

## 2023-03-12 ENCOUNTER — Inpatient Hospital Stay: Payer: 59 | Admitting: Hematology and Oncology

## 2023-03-12 VITALS — BP 94/66 | HR 79 | Temp 97.5°F | Resp 18 | Ht 67.0 in | Wt 157.7 lb

## 2023-03-12 DIAGNOSIS — Z170 Estrogen receptor positive status [ER+]: Secondary | ICD-10-CM | POA: Diagnosis not present

## 2023-03-12 DIAGNOSIS — C50811 Malignant neoplasm of overlapping sites of right female breast: Secondary | ICD-10-CM | POA: Diagnosis present

## 2023-03-12 DIAGNOSIS — Z5111 Encounter for antineoplastic chemotherapy: Secondary | ICD-10-CM | POA: Insufficient documentation

## 2023-03-12 DIAGNOSIS — Z923 Personal history of irradiation: Secondary | ICD-10-CM | POA: Insufficient documentation

## 2023-03-12 DIAGNOSIS — Z9221 Personal history of antineoplastic chemotherapy: Secondary | ICD-10-CM | POA: Diagnosis not present

## 2023-03-12 DIAGNOSIS — Z79811 Long term (current) use of aromatase inhibitors: Secondary | ICD-10-CM | POA: Insufficient documentation

## 2023-03-12 DIAGNOSIS — Z9011 Acquired absence of right breast and nipple: Secondary | ICD-10-CM | POA: Diagnosis not present

## 2023-03-12 LAB — CMP (CANCER CENTER ONLY)
ALT: 29 U/L (ref 0–44)
AST: 24 U/L (ref 15–41)
Albumin: 4.5 g/dL (ref 3.5–5.0)
Alkaline Phosphatase: 49 U/L (ref 38–126)
Anion gap: 8 (ref 5–15)
BUN: 13 mg/dL (ref 6–20)
CO2: 26 mmol/L (ref 22–32)
Calcium: 9.5 mg/dL (ref 8.9–10.3)
Chloride: 103 mmol/L (ref 98–111)
Creatinine: 0.56 mg/dL (ref 0.44–1.00)
GFR, Estimated: >60 mL/min (ref >60)
Glucose, Bld: 186 mg/dL — ABNORMAL HIGH (ref 70–99)
Potassium: 4.1 mmol/L (ref 3.5–5.1)
Sodium: 137 mmol/L (ref 135–145)
Total Bilirubin: 0.5 mg/dL (ref 0.3–1.2)
Total Protein: 6.9 g/dL (ref 6.5–8.1)

## 2023-03-12 LAB — CBC WITH DIFFERENTIAL (CANCER CENTER ONLY)
Abs Immature Granulocytes: 0.02 10*3/uL (ref 0.00–0.07)
Basophils Absolute: 0 10*3/uL (ref 0.0–0.1)
Basophils Relative: 1 %
Eosinophils Absolute: 0.1 10*3/uL (ref 0.0–0.5)
Eosinophils Relative: 2 %
HCT: 39.3 % (ref 36.0–46.0)
Hemoglobin: 13.6 g/dL (ref 12.0–15.0)
Immature Granulocytes: 1 %
Lymphocytes Relative: 39 %
Lymphs Abs: 1.5 10*3/uL (ref 0.7–4.0)
MCH: 28.9 pg (ref 26.0–34.0)
MCHC: 34.6 g/dL (ref 30.0–36.0)
MCV: 83.6 fL (ref 80.0–100.0)
Monocytes Absolute: 0.3 10*3/uL (ref 0.1–1.0)
Monocytes Relative: 8 %
Neutro Abs: 1.9 10*3/uL (ref 1.7–7.7)
Neutrophils Relative %: 49 %
Platelet Count: 229 10*3/uL (ref 150–400)
RBC: 4.7 MIL/uL (ref 3.87–5.11)
RDW: 15.9 % — ABNORMAL HIGH (ref 11.5–15.5)
WBC Count: 3.8 10*3/uL — ABNORMAL LOW (ref 4.0–10.5)
nRBC: 0 % (ref 0.0–0.2)

## 2023-03-12 MED ORDER — GOSERELIN ACETATE 3.6 MG ~~LOC~~ IMPL
3.6000 mg | DRUG_IMPLANT | SUBCUTANEOUS | Status: DC
  Administered 2023-03-12: 3.6 mg via SUBCUTANEOUS
  Filled 2023-03-12: qty 3.6

## 2023-03-12 NOTE — Assessment & Plan Note (Addendum)
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, margins negative, 4/7 lymph nodes, ER 75 to 95%, PR 9095%, HER2 negative, Ki-67 25% Genetics: Negative MammaPrint: Low risk (this is not a valid test for 4+ lymph nodes) Repeat prognostic panel on the lymph node in the final pathology also came back as ER/PR positive   Treatment plan: 1.  Adjuvant chemotherapy with dose dense Adriamycin and Cytoxan x4 followed by Taxol weekly x12 started 08/25/2021-01/19/2022 2. adjuvant radiation therapy 02/27/2022-04/04/2022 3.  Followed by adjuvant antiestrogen therapy with complete estrogen blockade and abemaciclib (discussed oophorectomy versus Zoladex plus letrozole plus abemaciclib) ------------------------------------------------------------------------------------------------------------------------ Recommendation:  Zoladex injections with letrozole  started June 2023 Verzinio adjuvant therapy started 12/13/2022 Monthly for Zoladex injections   Verzinio toxicities: Intermittent diarrhea Generalized fatigue and weakness:   Mild source Inability to function or exercise Because of all the above reasons we discontinued Verzinio.      Breast cancer surveillance: left breast mammogram 09/28/2022: Benign breast density category B  Patient is planning D IEP flap reconstruction 06/24/2023 at [facility]. Return to clinic monthly for Zoladex injections and in 6 months for follow-up with me.

## 2023-03-14 ENCOUNTER — Telehealth: Payer: Self-pay | Admitting: Hematology and Oncology

## 2023-03-14 ENCOUNTER — Telehealth: Payer: Self-pay

## 2023-03-14 NOTE — Telephone Encounter (Signed)
Patient called to correct injection appointments back to q28d. Appointments adjusted. Forwarding concerns to MD & RN about patient's surgery in July.

## 2023-03-14 NOTE — Telephone Encounter (Signed)
Reached out to patient to schedule per 4/2 LOS. Left voicemail

## 2023-03-14 NOTE — Telephone Encounter (Signed)
S/w pt regarding concerns for injection appt scheduled 7/23 as she is currently scheduled for surgery 06/24/23. She is concerned she will not feel well enough to come in for inj. Advised pt we could push out one week then change future appts concurrently. She is in agreement with this. Message sent to scheduler.

## 2023-03-24 ENCOUNTER — Encounter: Payer: Self-pay | Admitting: Hematology and Oncology

## 2023-03-27 ENCOUNTER — Inpatient Hospital Stay: Payer: 59 | Admitting: Hematology and Oncology

## 2023-03-27 ENCOUNTER — Inpatient Hospital Stay: Payer: 59

## 2023-04-01 ENCOUNTER — Ambulatory Visit: Payer: 59 | Attending: Plastic Surgery

## 2023-04-01 VITALS — Wt 155.0 lb

## 2023-04-01 DIAGNOSIS — Z483 Aftercare following surgery for neoplasm: Secondary | ICD-10-CM | POA: Insufficient documentation

## 2023-04-01 NOTE — Therapy (Signed)
OUTPATIENT PHYSICAL THERAPY SOZO SCREENING NOTE   Patient Name: Allison Reed MRN: [id number] DOB:10/03/1976, 47 y.o., female Today's Date: 04/01/2023  PCP: Adrian Prince, MD REFERRING PROVIDER: Glenna Fellows, MD   PT End of Session - 04/01/23 1649     Visit Number 9   # unchanged due to screen only   PT Start Time 1648    PT Stop Time 1652    PT Time Calculation (min) 4 min    Activity Tolerance Patient tolerated treatment well    Behavior During Therapy [facility] for tasks assessed/performed             Past Medical History:  Diagnosis Date   Diabetes mellitus without complication (HCC)    Type 2   Family history of brain tumor    Family history of skin cancer    Gestational diabetes    Headache    otc med prn   Heartburn in pregnancy    Incompetent cervix in pregnancy 04/2015   Missed abortion    no surgery required   Personal history of chemotherapy    Personal history of radiation therapy    Postpartum care following cesarean delivery (11/2) 10/12/2015   right breast ca 06/2021   UTI in pregnancy 03/2015   recent UTI, treatment completed   Past Surgical History:  Procedure Laterality Date   APPENDECTOMY  12/10/2004   BREAST BIOPSY Right 2022   BREAST BIOPSY Left 2022   BREAST RECONSTRUCTION WITH PLACEMENT OF TISSUE EXPANDER AND ALLODERM Right 07/25/2021   Procedure: RIGHT BREAST RECONSTRUCTION WITH PLACEMENT OF TISSUE EXPANDER AND ALLODERM;  Surgeon: Glenna Fellows, MD;  Location: [facility];  Service: Plastics;  Laterality: Right;   CERCLAGE LAPAROSCOPIC ABDOMINAL N/A 04/15/2015   Procedure: LAPAROSCOPIC TRANSABDOMINAL CERVCOISTHMIC CERCLAGE;  Surgeon: Fermin Schwab, MD;  Location: [facility];  Service: Gynecology;  Laterality: N/A;   CERVICAL CERCLAGE  02/08/2011   vaginal   CESAREAN SECTION N/A 10/12/2015   Procedure: CESAREAN SECTION;  Surgeon: Genia Del, MD;  Location: [facility];  Service: Obstetrics;  Laterality: N/A;    CESAREAN SECTION N/A 03/03/2018   Procedure: Repeat CESAREAN SECTION/Removal of Abdominal Cerclage;  Surgeon: Shea Evans, MD;  Location: [facility];  Service: Obstetrics;  Laterality: N/A;  EDD: 03/28/18   MASTECTOMY Right 2022   MASTECTOMY W/ SENTINEL NODE BIOPSY Right 07/25/2021   Procedure: RIGHT MASTECTOMY WITH AXILLARY SENTINEL LYMPH NODE BIOPSY;  Surgeon: Emelia Loron, MD;  Location: [facility];  Service: General;  Laterality: Right;   PORTACATH PLACEMENT N/A 08/24/2021   Procedure: INSERTION PORT-A-CATH;  Surgeon: Emelia Loron, MD;  Location: [facility];  Service: General;  Laterality: N/A;   RADIOACTIVE SEED GUIDED AXILLARY SENTINEL LYMPH NODE Right 07/25/2021   Procedure: RADIOACTIVE SEED GUIDED RIGHT AXILLARY NODE EXCISION;  Surgeon: Emelia Loron, MD;  Location: [facility];  Service: General;  Laterality: Right;   TISSUE EXPANDER PLACEMENT Right 12/04/2021   Procedure: REMOVAL RIGHT CHEST TISSUE EXPANDER;  Surgeon: Glenna Fellows, MD;  Location: [facility];  Service: Plastics;  Laterality: Right;   WISDOM TOOTH EXTRACTION  12/11/1999   Patient Active Problem List   Diagnosis Date Noted   Lower leg mass, left 05/02/2022   Port-A-Cath in place 08/25/2021   S/P mastectomy, right 07/25/2021   Genetic testing 07/01/2021   Family history of skin cancer 06/22/2021   Family history of brain tumor 06/22/2021   Malignant neoplasm of overlapping sites of right breast in female, estrogen receptor  positive 06/21/2021   Status post repeat low transverse cesarean section 03/03/2018   DM (diabetes mellitus) in pregnancy 03/02/2018   Incompetent cervix in pregnancy 03/02/2018    REFERRING DIAG: right breast cancer at risk for lymphedema  THERAPY DIAG: Aftercare following surgery for neoplasm  PERTINENT HISTORY: 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 She noted a palpable right breast mass. This is  followed by a mammogram with a mass with calcifications as really throughout the upper outer quadrant and in the central breast. This measures 9.4 x 8.7 x 6.7 cm. On ultrasound she had 3 separate masses at 10, 11, and 12:00. The sizes are 1.7, 2.1, and 2.2 cm. There is also a single abnormal axillary lymph node that has undergone biopsy. 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 She is s/p right breast mastectomy with SLNB 0/7 LN on August 16 andl also had tissue expanders in place.  She is s/p chemotherapy for 6 months every other week and had radiation following ; She had emergency sugery to remove expander 12/04/21; completed radiation 04/04/22    PRECAUTIONS: right UE Lymphedema risk, None  SUBJECTIVE: Pt returns for her 3 month L-Dex screens.   PAIN:  Are you having pain? No  SOZO SCREENING: Patient was assessed today using the SOZO machine to determine the lymphedema index score. This was compared to her baseline score. It was determined that she is NOT within the recommended range when compared to her baseline. It is recommended she return in 1 month to be reassessed. And to cont wearing her compression sleeve daily for up to 10 hours daily. If she continues to measure outside the recommended range, physical therapy treatment will be recommended at that time and a referral requested.   L-DEX FLOWSHEETS - 04/01/23 1600       L-DEX LYMPHEDEMA SCREENING   Measurement Type Unilateral    L-DEX MEASUREMENT EXTREMITY Upper Extremity    POSITION  Standing    DOMINANT SIDE Right    At Risk Side Right    BASELINE SCORE (UNILATERAL) 0    L-DEX SCORE (UNILATERAL) 4.1    VALUE  CHANGE (UNILAT) 4.1              Hermenia Bers, PTA 04/01/2023, 4:51 PM

## 2023-04-03 ENCOUNTER — Encounter: Payer: Self-pay | Admitting: Hematology and Oncology

## 2023-04-09 ENCOUNTER — Inpatient Hospital Stay: Payer: 59

## 2023-04-09 ENCOUNTER — Other Ambulatory Visit: Payer: Self-pay

## 2023-04-09 VITALS — BP 103/78 | HR 84 | Temp 98.5°F | Resp 18

## 2023-04-09 DIAGNOSIS — Z5111 Encounter for antineoplastic chemotherapy: Secondary | ICD-10-CM | POA: Diagnosis not present

## 2023-04-09 DIAGNOSIS — C50811 Malignant neoplasm of overlapping sites of right female breast: Secondary | ICD-10-CM

## 2023-04-09 MED ORDER — GOSERELIN ACETATE 3.6 MG ~~LOC~~ IMPL
3.6000 mg | DRUG_IMPLANT | SUBCUTANEOUS | Status: DC
  Administered 2023-04-09: 3.6 mg via SUBCUTANEOUS
  Filled 2023-04-09: qty 3.6

## 2023-04-22 ENCOUNTER — Other Ambulatory Visit: Payer: Self-pay

## 2023-04-22 ENCOUNTER — Encounter: Payer: Self-pay | Admitting: Hematology and Oncology

## 2023-04-22 DIAGNOSIS — Z170 Estrogen receptor positive status [ER+]: Secondary | ICD-10-CM

## 2023-04-26 ENCOUNTER — Encounter: Payer: Self-pay | Admitting: Hematology and Oncology

## 2023-04-26 ENCOUNTER — Inpatient Hospital Stay: Payer: 59

## 2023-04-29 NOTE — Therapy (Signed)
OUTPATIENT PHYSICAL THERAPY  UPPER EXTREMITY ONCOLOGY EVALUATION  Patient Name: Allison Reed MRN: [id number] DOB:1976-04-14, 47 y.o., female Today's Date: 04/30/2023  END OF SESSION:  PT End of Session - 04/30/23 0851     Visit Number 1    Number of Visits 16    Date for PT Re-Evaluation 06/25/23    PT Start Time 0802    PT Stop Time 0850    PT Time Calculation (min) 48 min    Activity Tolerance Patient tolerated treatment well    Behavior During Therapy [facility] for tasks assessed/performed             Past Medical History:  Diagnosis Date   Diabetes mellitus without complication (HCC)    Type 2   Family history of brain tumor    Family history of skin cancer    Gestational diabetes    Headache    otc med prn   Heartburn in pregnancy    Incompetent cervix in pregnancy 04/2015   Missed abortion    no surgery required   Personal history of chemotherapy    Personal history of radiation therapy    Postpartum care following cesarean delivery (11/2) 10/12/2015   right breast ca 06/2021   UTI in pregnancy 03/2015   recent UTI, treatment completed   Past Surgical History:  Procedure Laterality Date   APPENDECTOMY  12/10/2004   BREAST BIOPSY Right 2022   BREAST BIOPSY Left 2022   BREAST RECONSTRUCTION WITH PLACEMENT OF TISSUE EXPANDER AND ALLODERM Right 07/25/2021   Procedure: RIGHT BREAST RECONSTRUCTION WITH PLACEMENT OF TISSUE EXPANDER AND ALLODERM;  Surgeon: Glenna Fellows, MD;  Location: [facility];  Service: Plastics;  Laterality: Right;   CERCLAGE LAPAROSCOPIC ABDOMINAL N/A 04/15/2015   Procedure: LAPAROSCOPIC TRANSABDOMINAL CERVCOISTHMIC CERCLAGE;  Surgeon: Fermin Schwab, MD;  Location: [facility];  Service: Gynecology;  Laterality: N/A;   CERVICAL CERCLAGE  02/08/2011   vaginal   CESAREAN SECTION N/A 10/12/2015   Procedure: CESAREAN SECTION;  Surgeon: Genia Del, MD;  Location: [facility];  Service: Obstetrics;  Laterality: N/A;   CESAREAN  SECTION N/A 03/03/2018   Procedure: Repeat CESAREAN SECTION/Removal of Abdominal Cerclage;  Surgeon: Shea Evans, MD;  Location: [facility];  Service: Obstetrics;  Laterality: N/A;  EDD: 03/28/18   MASTECTOMY Right 2022   MASTECTOMY W/ SENTINEL NODE BIOPSY Right 07/25/2021   Procedure: RIGHT MASTECTOMY WITH AXILLARY SENTINEL LYMPH NODE BIOPSY;  Surgeon: Emelia Loron, MD;  Location: [facility];  Service: General;  Laterality: Right;   PORTACATH PLACEMENT N/A 08/24/2021   Procedure: INSERTION PORT-A-CATH;  Surgeon: Emelia Loron, MD;  Location: [facility];  Service: General;  Laterality: N/A;   RADIOACTIVE SEED GUIDED AXILLARY SENTINEL LYMPH NODE Right 07/25/2021   Procedure: RADIOACTIVE SEED GUIDED RIGHT AXILLARY NODE EXCISION;  Surgeon: Emelia Loron, MD;  Location: [facility];  Service: General;  Laterality: Right;   TISSUE EXPANDER PLACEMENT Right 12/04/2021   Procedure: REMOVAL RIGHT CHEST TISSUE EXPANDER;  Surgeon: Glenna Fellows, MD;  Location: [facility];  Service: Plastics;  Laterality: Right;   WISDOM TOOTH EXTRACTION  12/11/1999   Patient Active Problem List   Diagnosis Date Noted   Lower leg mass, left 05/02/2022   Port-A-Cath in place 08/25/2021   S/P mastectomy, right 07/25/2021   Genetic testing 07/01/2021   Family history of skin cancer 06/22/2021   Family history of brain tumor 06/22/2021   Malignant neoplasm of overlapping sites of right breast in female,  estrogen receptor positive (HCC) 06/21/2021   Status post repeat low transverse cesarean section 03/03/2018   DM (diabetes mellitus) in pregnancy 03/02/2018   Incompetent cervix in pregnancy 03/02/2018    PCP: Dr. Adrian Prince, MD  REFERRING PROVIDER: [name], MD  REFERRING DIAG: S/p Right Mastectomy  THERAPY DIAG:  Malignant neoplasm of overlapping sites of right breast in female, estrogen receptor positive [facility])  Aftercare following  surgery for neoplasm  Stiffness of right shoulder, not elsewhere classified  Lymphedema, not elsewhere classified  ONSET DATE: April 22,2024  Rationale for Evaluation and Treatment: Rehabilitation  SUBJECTIVE:                                                                                                                                                                                           SUBJECTIVE STATEMENT:  Pain in forearm and in right UT and right medial arm and sometimes. It is not constant but when it happens it is sharp and then it goes away in a few minutes.  It happened the first time I threw a bowling ball over the weekend.  The arm pain comes when I do anything that jerks my arm. The Upper trap pain is constant. I dont feel like I have any swelling  in my arm. It is hard to sleep on my right side, and I have trouble with my [place] dances because I can't get my arms in position.  PERTINENT HISTORY:  Pt is s/p Right Mastectomy with Tissue Expander and 4+/7 LN on 07/25/21. She is s/p 6 months of chemo followed by radiation. She had emergency surgery to remove tissue expander due to infection on 12/04/2021. She completed radiation on 04/04/22. She will be having right breast reconstruction on 06/24/2023.  PAIN:  Are you having pain? Yes NPRS scale: 0-7-8/10 Pain location: posterior shoulder/ stretching arm medial right arm Pain orientation: Right  PAIN TYPE: aching, sharp, and tight Pain description: intermittent, sharp, and aching , traps constant pain Aggravating factors: stretching arm, sleeping on right, big movements with right UE Relieving factors: rest, not stretching, MLD, compression sleeve  PRECAUTIONS: Right lymphedema risk  WEIGHT BEARING RESTRICTIONS: No  FALLS:  Has patient fallen in last 6 months? No  LIVING ENVIRONMENT: Lives with: husband and 2 children Lives in: House/apartment Stairs: Yes; Internal: 14 steps; can reach both and External: 0 steps;  none   OCCUPATION: Administrative Asst, works from home  LEISURE: walking, work out with trainer,  HAND DOMINANCE: right   PRIOR LEVEL OF FUNCTION: Independent  PATIENT GOALS: Get my mobility back, decrease pain   OBJECTIVE:  COGNITION: Overall cognitive status: Within functional  limits for tasks assessed   PALPATION: Very tender right UT, supraspinatus  OBSERVATIONS / OTHER ASSESSMENTS: Right UT compensation, decreased right scapular mobility with shoulder ROM  SENSATION: Light touch: Appears intact    POSTURE: forward head, rounded shoulders, trunk rotation  UPPER EXTREMITY AROM/PROM:  A/PROM RIGHT   eval   Shoulder extension 25 (pain ant. shoulder  Shoulder flexion 132, UT compensation  Shoulder abduction 123, tight UT/arm  Shoulder internal rotation   Shoulder external rotation     (Blank rows = not tested)  A/PROM LEFT   eval  Shoulder extension 41  Shoulder flexion 153  Shoulder abduction 178  Shoulder internal rotation 72  Shoulder external rotation 95    (Blank rows = not tested)  CERVICAL AROM:WNL except tightness noted in bilateral UT  UPPER EXTREMITY STRENGTH:   LYMPHEDEMA ASSESSMENTS:   SURGERY TYPE/DATE: 07/25/2021 Right Mastectomy with SLNB and tissue expander, Tissue expander removed 12/04/21  NUMBER OF LYMPH NODES REMOVED: 4/7  CHEMOTHERAPY: YES, 08/25/21-01/19/22  RADIATION:YES, 02/26/22-04/04/2022  HORMONE TREATMENT: YES  INFECTIONS: Yes with need for tissue expander to be removed   LYMPHEDEMA ASSESSMENTS:   LANDMARK RIGHT  eval  At axilla  32.4  15 cm proximal to olecranon process   10 cm proximal to olecranon process 25.6  Olecranon process 24.7  15 cm proximal to ulnar styloid process   10 cm proximal to ulnar styloid process 21.0  Just proximal to ulnar styloid process 15.3  Across hand at thumb web space 19.1  At base of 2nd digit 5.8  (Blank rows = not tested)  LANDMARK LEFT  eval  At axilla  29  15 cm proximal  to olecranon process   10 cm proximal to olecranon process 24.2  Olecranon process 24.3  15 cm proximal to ulnar styloid process   10 cm proximal to ulnar styloid process 20.3  Just proximal to ulnar styloid process 14.6  Across hand at thumb web space 18.8  At base of 2nd digit 5.9  (Blank rows = not tested)    GAIT: WNL  L-DEX LYMPHEDEMA SCREENING: The patient was assessed using the L-Dex machine today to produce a lymphedema index baseline score. The patient will be reassessed on a regular basis (typically every 3 months) to obtain new L-Dex scores. If the score is > 6.5 points away from his/her baseline score indicating onset of subclinical lymphedema, it will be recommended to wear a compression garment for 4 weeks, 12 hours per day and then be reassessed. If the score continues to be > 6.5 points from baseline at reassessment, we will initiate lymphedema treatment. Assessing in this manner has a 95% rate of preventing clinically significant lymphedema. 04/30/2023 SCORE TODAY -1.7  QUICK DASH SURVEY: 61%   TODAY'S TREATMENT:  DATE: 04/30/23 No treatment today due to time. Discussed POC, DN, mobilizations, STM, PROM, strength    PATIENT EDUCATION:  Education details: Discussed POC, DN mobilizations, STM, PROM, strength, decrease UT compensation Person educated: Patient Education method: Explanation Education comprehension: verbalized understanding  HOME EXERCISE PROGRAM: None given  ASSESSMENT:  CLINICAL IMPRESSION: Patient is a 47 y.o. female who was seen today for physical therapy evaluation and treatment for pain in the right UT/posterior shoulder and medial right arm and limitations  in the right shoulder ROM with decreased scapular mobility and UT compensation. Pt  likely has right arm cording though not visible. SOZO Screen for  lymphedema was normal today. Pt is functionally limited by restrictions in right shoulder, UT compensation and intermittent pain in right medial arm radiating into forearm. She will benefit from skilled PT to address deficits and return to PLOF.   OBJECTIVE IMPAIRMENTS: decreased knowledge of condition, decreased ROM, decreased strength, increased fascial restrictions, impaired flexibility, impaired UE functional use, postural dysfunction, and pain.   ACTIVITY LIMITATIONS: lifting, sleeping, and reach over head  PARTICIPATION LIMITATIONS:  [place] dancing, working out  PERSONAL FACTORS: 1-2 comorbidities: Right breast cancer s/p chemotherapy and radiation  are also affecting patient's functional outcome.   REHAB POTENTIAL: Good  CLINICAL DECISION MAKING: Stable/uncomplicated  EVALUATION COMPLEXITY: Low  GOALS: Goals reviewed with patient? Yes  SHORT TERM GOALS: Target date: 05/28/2023  Independent with HEP for Right shoulder ROM, strength/stabilization Baseline: Goal status: INITIAL  2.  Decreased pain in Right UT/pecs/ medial arm by 25 % Baseline:  Goal status: INITIAL  3.  Shoulder flexion and abd improved by 10-12 degrees for improved reaching Baseline:  Goal status: INITIAL Improved shoulder flexion and abd by 15-20 degrees  4.  Improved right scapulo -humeral rhythm and with decreased UT compensation Baseline:  Goal status: INITIAL   LONG TERM GOALS: Target date: 06/25/23  Right UT/pec/arm pain improved by atleast 50%  Baseline:  Goal status: INITIAL  2.  Quick dash no greater than 20%for improved function Baseline:  Goal status: INITIAL  3.   Able to perform [place] dancing with correct arm position without increased pain/compensation Baseline:  Goal status: INITIAL   4.    Improved right scapulo -humeral rhythm and with decreased UT compensation Baseline:  Goal status: INITIAL   5.  Pt will have improved ability to sleep on the right side without  increased pain. Baseline:  Goal status: INITIAL  PLAN:  PT FREQUENCY: 2x/week  PT DURATION: 8 weeks  PLANNED INTERVENTIONS: Therapeutic exercises, Therapeutic activity, Neuromuscular re-education, Patient/Family education, Self Care, Joint mobilization, Dry Needling, Manual lymph drainage, scar mobilization, Ionotophoresis 4mg /ml Dexamethasone, Manual therapy, and Re-evaluation  PLAN FOR NEXT SESSION: .UT/levator stretches, DN to UT/pecs and other areas prn, STM prn, scapular and GH mobs, PROM, lower trap strengthening to decrease compensation, functional training  Waynette Buttery, PT 04/30/2023, 8:52 AM

## 2023-04-30 ENCOUNTER — Other Ambulatory Visit: Payer: Self-pay

## 2023-04-30 ENCOUNTER — Ambulatory Visit: Payer: 59 | Attending: Hematology and Oncology

## 2023-04-30 DIAGNOSIS — I890 Lymphedema, not elsewhere classified: Secondary | ICD-10-CM | POA: Insufficient documentation

## 2023-04-30 DIAGNOSIS — Z170 Estrogen receptor positive status [ER+]: Secondary | ICD-10-CM | POA: Diagnosis present

## 2023-04-30 DIAGNOSIS — M25611 Stiffness of right shoulder, not elsewhere classified: Secondary | ICD-10-CM | POA: Diagnosis present

## 2023-04-30 DIAGNOSIS — C50811 Malignant neoplasm of overlapping sites of right female breast: Secondary | ICD-10-CM | POA: Diagnosis present

## 2023-04-30 DIAGNOSIS — Z483 Aftercare following surgery for neoplasm: Secondary | ICD-10-CM | POA: Insufficient documentation

## 2023-05-02 ENCOUNTER — Ambulatory Visit: Payer: 59

## 2023-05-02 DIAGNOSIS — I890 Lymphedema, not elsewhere classified: Secondary | ICD-10-CM

## 2023-05-02 DIAGNOSIS — Z483 Aftercare following surgery for neoplasm: Secondary | ICD-10-CM

## 2023-05-02 DIAGNOSIS — C50811 Malignant neoplasm of overlapping sites of right female breast: Secondary | ICD-10-CM | POA: Diagnosis not present

## 2023-05-02 DIAGNOSIS — Z170 Estrogen receptor positive status [ER+]: Secondary | ICD-10-CM

## 2023-05-02 DIAGNOSIS — M25611 Stiffness of right shoulder, not elsewhere classified: Secondary | ICD-10-CM

## 2023-05-02 NOTE — Therapy (Signed)
OUTPATIENT PHYSICAL THERAPY  UPPER EXTREMITY ONCOLOGY TREATMENT  Patient Name: Allison Reed MRN: [id number] DOB:1976-03-19, 47 y.o., female Today's Date: 05/02/2023  END OF SESSION:  PT End of Session - 05/02/23 0911     Visit Number 2    Number of Visits 16    Date for PT Re-Evaluation 06/25/23    PT Start Time 0903    PT Stop Time 0958    PT Time Calculation (min) 55 min    Activity Tolerance Patient tolerated treatment well    Behavior During Therapy [facility] for tasks assessed/performed             Past Medical History:  Diagnosis Date   Diabetes mellitus without complication (HCC)    Type 2   Family history of brain tumor    Family history of skin cancer    Gestational diabetes    Headache    otc med prn   Heartburn in pregnancy    Incompetent cervix in pregnancy 04/2015   Missed abortion    no surgery required   Personal history of chemotherapy    Personal history of radiation therapy    Postpartum care following cesarean delivery (11/2) 10/12/2015   right breast ca 06/2021   UTI in pregnancy 03/2015   recent UTI, treatment completed   Past Surgical History:  Procedure Laterality Date   APPENDECTOMY  12/10/2004   BREAST BIOPSY Right 2022   BREAST BIOPSY Left 2022   BREAST RECONSTRUCTION WITH PLACEMENT OF TISSUE EXPANDER AND ALLODERM Right 07/25/2021   Procedure: RIGHT BREAST RECONSTRUCTION WITH PLACEMENT OF TISSUE EXPANDER AND ALLODERM;  Surgeon: Glenna Fellows, MD;  Location: [facility];  Service: Plastics;  Laterality: Right;   CERCLAGE LAPAROSCOPIC ABDOMINAL N/A 04/15/2015   Procedure: LAPAROSCOPIC TRANSABDOMINAL CERVCOISTHMIC CERCLAGE;  Surgeon: Fermin Schwab, MD;  Location: [facility];  Service: Gynecology;  Laterality: N/A;   CERVICAL CERCLAGE  02/08/2011   vaginal   CESAREAN SECTION N/A 10/12/2015   Procedure: CESAREAN SECTION;  Surgeon: Genia Del, MD;  Location: [facility];  Service: Obstetrics;  Laterality: N/A;   CESAREAN  SECTION N/A 03/03/2018   Procedure: Repeat CESAREAN SECTION/Removal of Abdominal Cerclage;  Surgeon: Shea Evans, MD;  Location: [facility];  Service: Obstetrics;  Laterality: N/A;  EDD: 03/28/18   MASTECTOMY Right 2022   MASTECTOMY W/ SENTINEL NODE BIOPSY Right 07/25/2021   Procedure: RIGHT MASTECTOMY WITH AXILLARY SENTINEL LYMPH NODE BIOPSY;  Surgeon: Emelia Loron, MD;  Location: [facility];  Service: General;  Laterality: Right;   PORTACATH PLACEMENT N/A 08/24/2021   Procedure: INSERTION PORT-A-CATH;  Surgeon: Emelia Loron, MD;  Location: [facility];  Service: General;  Laterality: N/A;   RADIOACTIVE SEED GUIDED AXILLARY SENTINEL LYMPH NODE Right 07/25/2021   Procedure: RADIOACTIVE SEED GUIDED RIGHT AXILLARY NODE EXCISION;  Surgeon: Emelia Loron, MD;  Location: [facility];  Service: General;  Laterality: Right;   TISSUE EXPANDER PLACEMENT Right 12/04/2021   Procedure: REMOVAL RIGHT CHEST TISSUE EXPANDER;  Surgeon: Glenna Fellows, MD;  Location: [facility];  Service: Plastics;  Laterality: Right;   WISDOM TOOTH EXTRACTION  12/11/1999   Patient Active Problem List   Diagnosis Date Noted   Lower leg mass, left 05/02/2022   Port-A-Cath in place 08/25/2021   S/P mastectomy, right 07/25/2021   Genetic testing 07/01/2021   Family history of skin cancer 06/22/2021   Family history of brain tumor 06/22/2021   Malignant neoplasm of overlapping sites of right breast in female,  estrogen receptor positive (HCC) 06/21/2021   Status post repeat low transverse cesarean section 03/03/2018   DM (diabetes mellitus) in pregnancy 03/02/2018   Incompetent cervix in pregnancy 03/02/2018    PCP: Dr. Adrian Prince, MD  REFERRING PROVIDER: [name], MD  REFERRING DIAG: S/p Right Mastectomy  THERAPY DIAG:  Malignant neoplasm of overlapping sites of right breast in female, estrogen receptor positive [facility])  Aftercare following  surgery for neoplasm  Stiffness of right shoulder, not elsewhere classified  Lymphedema, not elsewhere classified  ONSET DATE: April 22,2024  Rationale for Evaluation and Treatment: Rehabilitation  SUBJECTIVE:                                                                                                                                                                                           SUBJECTIVE STATEMENT:  I am very tight and stressed today. The surgeons office called me yesterday and pushed my reconstruction out 2 months! I have my parents flying in from [place] for this. I've placed a call to the triage nurse to ask why.   PERTINENT HISTORY:  Pt is s/p Right Mastectomy with Tissue Expander and 4+/7 LN on 07/25/21. She is s/p 6 months of chemo followed by radiation. She had emergency surgery to remove tissue expander due to infection on 12/04/2021. She completed radiation on 04/04/22. She will be having right breast reconstruction on 06/24/2023.  PAIN:  Are you having pain? Yes NPRS scale: 5/10 Pain location: posterior shoulder/ stretching arm medial right arm Pain orientation: Right  PAIN TYPE: sharp, and tight Pain description: intermittent, sharp, and aching , traps constant pain Aggravating factors: stretching arm, sleeping on right, big movements with right UE Relieving factors: rest, not stretching, MLD, compression sleeve  PRECAUTIONS: Right lymphedema risk  WEIGHT BEARING RESTRICTIONS: No  FALLS:  Has patient fallen in last 6 months? No  LIVING ENVIRONMENT: Lives with: husband and 2 children Lives in: House/apartment Stairs: Yes; Internal: 14 steps; can reach both and External: 0 steps; none   OCCUPATION: Administrative Asst, works from home  LEISURE: walking, work out with trainer,  HAND DOMINANCE: right   PRIOR LEVEL OF FUNCTION: Independent  PATIENT GOALS: Get my mobility back, decrease pain   OBJECTIVE:  COGNITION: Overall cognitive status:  Within functional limits for tasks assessed   PALPATION: Very tender right UT, supraspinatus  OBSERVATIONS / OTHER ASSESSMENTS: Right UT compensation, decreased right scapular mobility with shoulder ROM  SENSATION: Light touch: Appears intact    POSTURE: forward head, rounded shoulders, trunk rotation  UPPER EXTREMITY AROM/PROM:  A/PROM RIGHT   eval   Shoulder extension 25 (pain ant. shoulder  Shoulder flexion 132, UT compensation  Shoulder abduction 123, tight UT/arm  Shoulder internal rotation   Shoulder external rotation     (Blank rows = not tested)  A/PROM LEFT   eval  Shoulder extension 41  Shoulder flexion 153  Shoulder abduction 178  Shoulder internal rotation 72  Shoulder external rotation 95    (Blank rows = not tested)  CERVICAL AROM:WNL except tightness noted in bilateral UT  UPPER EXTREMITY STRENGTH:   LYMPHEDEMA ASSESSMENTS:   SURGERY TYPE/DATE: 07/25/2021 Right Mastectomy with SLNB and tissue expander, Tissue expander removed 12/04/21  NUMBER OF LYMPH NODES REMOVED: 4/7  CHEMOTHERAPY: YES, 08/25/21-01/19/22  RADIATION:YES, 02/26/22-04/04/2022  HORMONE TREATMENT: YES  INFECTIONS: Yes with need for tissue expander to be removed   LYMPHEDEMA ASSESSMENTS:   LANDMARK RIGHT  eval  At axilla  32.4  15 cm proximal to olecranon process   10 cm proximal to olecranon process 25.6  Olecranon process 24.7  15 cm proximal to ulnar styloid process   10 cm proximal to ulnar styloid process 21.0  Just proximal to ulnar styloid process 15.3  Across hand at thumb web space 19.1  At base of 2nd digit 5.8  (Blank rows = not tested)  LANDMARK LEFT  eval  At axilla  29  15 cm proximal to olecranon process   10 cm proximal to olecranon process 24.2  Olecranon process 24.3  15 cm proximal to ulnar styloid process   10 cm proximal to ulnar styloid process 20.3  Just proximal to ulnar styloid process 14.6  Across hand at thumb web space 18.8  At base of  2nd digit 5.9  (Blank rows = not tested)    GAIT: WNL  L-DEX LYMPHEDEMA SCREENING: The patient was assessed using the L-Dex machine today to produce a lymphedema index baseline score. The patient will be reassessed on a regular basis (typically every 3 months) to obtain new L-Dex scores. If the score is > 6.5 points away from his/her baseline score indicating onset of subclinical lymphedema, it will be recommended to wear a compression garment for 4 weeks, 12 hours per day and then be reassessed. If the score continues to be > 6.5 points from baseline at reassessment, we will initiate lymphedema treatment. Assessing in this manner has a 95% rate of preventing clinically significant lymphedema. 04/30/2023 SCORE TODAY -1.7  QUICK DASH SURVEY: 61%   TODAY'S TREATMENT:                                                                                                                                          DATE:  05/02/23: Manual therapy STM to Rt chest wall, UT, and shoulder where pt palpably tight, very tender to touch at UT where multiple trigger points palpable; used cocoa butter and pt was in varying positions during session from supine, to Lt S/L, prone and then finished in supine to Rt  SCM and cervical paraspinals and UT with cervical P/ROM MFR to Rt chest wall before use of cocoa butter and at Rt medial upper arm where tightness palpable P/ROM in supine of Lt shoulder intermittently during STM into flexion, abd and D2 with scapular depression by therapist throughout, pt was very tight due to muscle guarding which limits her end motions and multiple VC's to relax due to muscle guarding; also cervical Lt side bend with trigger point release to Rt UT  04/30/23 No treatment today due to time. Discussed POC, DN, mobilizations, STM, PROM, strength    PATIENT EDUCATION:  Education details: Discussed POC, DN mobilizations, STM, PROM, strength, decrease UT compensation Person educated:  Patient Education method: Explanation Education comprehension: verbalized understanding  HOME EXERCISE PROGRAM: None given  ASSESSMENT:  CLINICAL IMPRESSION: Began manual therapy using varying techniques, see above, for trigger point release and working to improve her end Rt shoulder P/ROM that is very limited at this time due to muscle tightness, guarding and pain. Pt reports feeling some looser by end of session and able to put shirt on with increased ease due to being able to lift arm higher with less pain.    OBJECTIVE IMPAIRMENTS: decreased knowledge of condition, decreased ROM, decreased strength, increased fascial restrictions, impaired flexibility, impaired UE functional use, postural dysfunction, and pain.   ACTIVITY LIMITATIONS: lifting, sleeping, and reach over head  PARTICIPATION LIMITATIONS:  [place] dancing, working out  PERSONAL FACTORS: 1-2 comorbidities: Right breast cancer s/p chemotherapy and radiation  are also affecting patient's functional outcome.   REHAB POTENTIAL: Good  CLINICAL DECISION MAKING: Stable/uncomplicated  EVALUATION COMPLEXITY: Low  GOALS: Goals reviewed with patient? Yes  SHORT TERM GOALS: Target date: 05/28/2023  Independent with HEP for Right shoulder ROM, strength/stabilization Baseline: Goal status: INITIAL  2.  Decreased pain in Right UT/pecs/ medial arm by 25 % Baseline:  Goal status: INITIAL  3.  Shoulder flexion and abd improved by 10-12 degrees for improved reaching Baseline:  Goal status: INITIAL Improved shoulder flexion and abd by 15-20 degrees  4.  Improved right scapulo -humeral rhythm and with decreased UT compensation Baseline:  Goal status: INITIAL   LONG TERM GOALS: Target date: 06/25/23  Right UT/pec/arm pain improved by atleast 50%  Baseline:  Goal status: INITIAL  2.  Quick dash no greater than 20%for improved function Baseline:  Goal status: INITIAL  3.   Able to perform [place] dancing with correct  arm position without increased pain/compensation Baseline:  Goal status: INITIAL   4.    Improved right scapulo -humeral rhythm and with decreased UT compensation Baseline:  Goal status: INITIAL   5.  Pt will have improved ability to sleep on the right side without increased pain. Baseline:  Goal status: INITIAL  PLAN:  PT FREQUENCY: 2x/week  PT DURATION: 8 weeks  PLANNED INTERVENTIONS: Therapeutic exercises, Therapeutic activity, Neuromuscular re-education, Patient/Family education, Self Care, Joint mobilization, Dry Needling, Manual lymph drainage, scar mobilization, Ionotophoresis 4mg /ml Dexamethasone, Manual therapy, and Re-evaluation  PLAN FOR NEXT SESSION: .UT/levator stretches, DN to UT/pecs and other areas prn, STM prn, scapular and GH mobs, PROM, lower trap strengthening to decrease compensation, functional training  Hermenia Bers, PTA 05/02/2023, 10:06 AM

## 2023-05-07 ENCOUNTER — Inpatient Hospital Stay: Payer: 59 | Attending: Hematology and Oncology

## 2023-05-07 ENCOUNTER — Ambulatory Visit: Payer: 59

## 2023-05-07 VITALS — BP 114/94 | HR 97 | Temp 98.1°F | Resp 18

## 2023-05-07 DIAGNOSIS — C50811 Malignant neoplasm of overlapping sites of right female breast: Secondary | ICD-10-CM | POA: Diagnosis present

## 2023-05-07 DIAGNOSIS — Z5111 Encounter for antineoplastic chemotherapy: Secondary | ICD-10-CM | POA: Insufficient documentation

## 2023-05-07 DIAGNOSIS — Z9221 Personal history of antineoplastic chemotherapy: Secondary | ICD-10-CM | POA: Diagnosis not present

## 2023-05-07 DIAGNOSIS — Z170 Estrogen receptor positive status [ER+]: Secondary | ICD-10-CM

## 2023-05-07 DIAGNOSIS — Z923 Personal history of irradiation: Secondary | ICD-10-CM | POA: Diagnosis not present

## 2023-05-07 DIAGNOSIS — Z483 Aftercare following surgery for neoplasm: Secondary | ICD-10-CM

## 2023-05-07 DIAGNOSIS — I890 Lymphedema, not elsewhere classified: Secondary | ICD-10-CM

## 2023-05-07 DIAGNOSIS — Z79811 Long term (current) use of aromatase inhibitors: Secondary | ICD-10-CM | POA: Diagnosis not present

## 2023-05-07 DIAGNOSIS — M25611 Stiffness of right shoulder, not elsewhere classified: Secondary | ICD-10-CM

## 2023-05-07 MED ORDER — GOSERELIN ACETATE 3.6 MG ~~LOC~~ IMPL
3.6000 mg | DRUG_IMPLANT | SUBCUTANEOUS | Status: DC
  Administered 2023-05-07: 3.6 mg via SUBCUTANEOUS
  Filled 2023-05-07: qty 3.6

## 2023-05-07 NOTE — Therapy (Signed)
OUTPATIENT PHYSICAL THERAPY  UPPER EXTREMITY ONCOLOGY TREATMENT  Patient Name: Allison Reed MRN: [id number] DOB:1976-01-13, 47 y.o., female Today's Date: 05/07/2023  END OF SESSION:  PT End of Session - 05/07/23 0806     Visit Number 3    Number of Visits 16    Date for PT Re-Evaluation 06/25/23    PT Start Time 0804    PT Stop Time 0858    PT Time Calculation (min) 54 min    Activity Tolerance Patient tolerated treatment well    Behavior During Therapy [facility] for tasks assessed/performed             Past Medical History:  Diagnosis Date   Diabetes mellitus without complication (HCC)    Type 2   Family history of brain tumor    Family history of skin cancer    Gestational diabetes    Headache    otc med prn   Heartburn in pregnancy    Incompetent cervix in pregnancy 04/2015   Missed abortion    no surgery required   Personal history of chemotherapy    Personal history of radiation therapy    Postpartum care following cesarean delivery (11/2) 10/12/2015   right breast ca 06/2021   UTI in pregnancy 03/2015   recent UTI, treatment completed   Past Surgical History:  Procedure Laterality Date   APPENDECTOMY  12/10/2004   BREAST BIOPSY Right 2022   BREAST BIOPSY Left 2022   BREAST RECONSTRUCTION WITH PLACEMENT OF TISSUE EXPANDER AND ALLODERM Right 07/25/2021   Procedure: RIGHT BREAST RECONSTRUCTION WITH PLACEMENT OF TISSUE EXPANDER AND ALLODERM;  Surgeon: Glenna Fellows, MD;  Location: [facility];  Service: Plastics;  Laterality: Right;   CERCLAGE LAPAROSCOPIC ABDOMINAL N/A 04/15/2015   Procedure: LAPAROSCOPIC TRANSABDOMINAL CERVCOISTHMIC CERCLAGE;  Surgeon: Fermin Schwab, MD;  Location: [facility];  Service: Gynecology;  Laterality: N/A;   CERVICAL CERCLAGE  02/08/2011   vaginal   CESAREAN SECTION N/A 10/12/2015   Procedure: CESAREAN SECTION;  Surgeon: Genia Del, MD;  Location: [facility];  Service: Obstetrics;  Laterality: N/A;   CESAREAN  SECTION N/A 03/03/2018   Procedure: Repeat CESAREAN SECTION/Removal of Abdominal Cerclage;  Surgeon: Shea Evans, MD;  Location: [facility];  Service: Obstetrics;  Laterality: N/A;  EDD: 03/28/18   MASTECTOMY Right 2022   MASTECTOMY W/ SENTINEL NODE BIOPSY Right 07/25/2021   Procedure: RIGHT MASTECTOMY WITH AXILLARY SENTINEL LYMPH NODE BIOPSY;  Surgeon: Emelia Loron, MD;  Location: [facility];  Service: General;  Laterality: Right;   PORTACATH PLACEMENT N/A 08/24/2021   Procedure: INSERTION PORT-A-CATH;  Surgeon: Emelia Loron, MD;  Location: [facility];  Service: General;  Laterality: N/A;   RADIOACTIVE SEED GUIDED AXILLARY SENTINEL LYMPH NODE Right 07/25/2021   Procedure: RADIOACTIVE SEED GUIDED RIGHT AXILLARY NODE EXCISION;  Surgeon: Emelia Loron, MD;  Location: [facility];  Service: General;  Laterality: Right;   TISSUE EXPANDER PLACEMENT Right 12/04/2021   Procedure: REMOVAL RIGHT CHEST TISSUE EXPANDER;  Surgeon: Glenna Fellows, MD;  Location: [facility];  Service: Plastics;  Laterality: Right;   WISDOM TOOTH EXTRACTION  12/11/1999   Patient Active Problem List   Diagnosis Date Noted   Lower leg mass, left 05/02/2022   Port-A-Cath in place 08/25/2021   S/P mastectomy, right 07/25/2021   Genetic testing 07/01/2021   Family history of skin cancer 06/22/2021   Family history of brain tumor 06/22/2021   Malignant neoplasm of overlapping sites of right breast in female,  estrogen receptor positive (HCC) 06/21/2021   Status post repeat low transverse cesarean section 03/03/2018   DM (diabetes mellitus) in pregnancy 03/02/2018   Incompetent cervix in pregnancy 03/02/2018    PCP: Dr. Adrian Prince, MD  REFERRING PROVIDER: [name], MD  REFERRING DIAG: S/p Right Mastectomy  THERAPY DIAG:  Malignant neoplasm of overlapping sites of right breast in female, estrogen receptor positive [facility])  Aftercare following  surgery for neoplasm  Stiffness of right shoulder, not elsewhere classified  Lymphedema, not elsewhere classified  ONSET DATE: April 22,2024  Rationale for Evaluation and Treatment: Rehabilitation  SUBJECTIVE:                                                                                                                                                                                           SUBJECTIVE STATEMENT:  I can tell my chest feels a little more open since last week but my arm and upper trap is hurting today.   PERTINENT HISTORY:  Pt is s/p Right Mastectomy with Tissue Expander and 4+/7 LN on 07/25/21. She is s/p 6 months of chemo followed by radiation. She had emergency surgery to remove tissue expander due to infection on 12/04/2021. She completed radiation on 04/04/22. She will be having right breast reconstruction on 06/24/2023.  PAIN:  Are you having pain? Yes NPRS scale: 6/10 Pain location: posterior shoulder/ stretching arm medial right arm Pain orientation: Right  PAIN TYPE: heavy Pain description: intermittent, sharp, and aching , traps constant pain Aggravating factors: stretching arm, sleeping on right, big movements with right UE Relieving factors: rest, not stretching, MLD, compression sleeve  PRECAUTIONS: Right lymphedema risk  WEIGHT BEARING RESTRICTIONS: No  FALLS:  Has patient fallen in last 6 months? No  LIVING ENVIRONMENT: Lives with: husband and 2 children Lives in: House/apartment Stairs: Yes; Internal: 14 steps; can reach both and External: 0 steps; none   OCCUPATION: Administrative Asst, works from home  LEISURE: walking, work out with trainer,  HAND DOMINANCE: right   PRIOR LEVEL OF FUNCTION: Independent  PATIENT GOALS: Get my mobility back, decrease pain   OBJECTIVE:  COGNITION: Overall cognitive status: Within functional limits for tasks assessed   PALPATION: Very tender right UT, supraspinatus  OBSERVATIONS / OTHER  ASSESSMENTS: Right UT compensation, decreased right scapular mobility with shoulder ROM  SENSATION: Light touch: Appears intact    POSTURE: forward head, rounded shoulders, trunk rotation  UPPER EXTREMITY AROM/PROM:  A/PROM RIGHT   eval   Shoulder extension 25 (pain ant. shoulder  Shoulder flexion 132, UT compensation  Shoulder abduction 123, tight UT/arm  Shoulder internal rotation   Shoulder external rotation     (  Blank rows = not tested)  A/PROM LEFT   eval  Shoulder extension 41  Shoulder flexion 153  Shoulder abduction 178  Shoulder internal rotation 72  Shoulder external rotation 95    (Blank rows = not tested)  CERVICAL AROM:WNL except tightness noted in bilateral UT  UPPER EXTREMITY STRENGTH:   LYMPHEDEMA ASSESSMENTS:   SURGERY TYPE/DATE: 07/25/2021 Right Mastectomy with SLNB and tissue expander, Tissue expander removed 12/04/21  NUMBER OF LYMPH NODES REMOVED: 4/7  CHEMOTHERAPY: YES, 08/25/21-01/19/22  RADIATION:YES, 02/26/22-04/04/2022  HORMONE TREATMENT: YES  INFECTIONS: Yes with need for tissue expander to be removed   LYMPHEDEMA ASSESSMENTS:   LANDMARK RIGHT  eval  At axilla  32.4  15 cm proximal to olecranon process   10 cm proximal to olecranon process 25.6  Olecranon process 24.7  15 cm proximal to ulnar styloid process   10 cm proximal to ulnar styloid process 21.0  Just proximal to ulnar styloid process 15.3  Across hand at thumb web space 19.1  At base of 2nd digit 5.8  (Blank rows = not tested)  LANDMARK LEFT  eval  At axilla  29  15 cm proximal to olecranon process   10 cm proximal to olecranon process 24.2  Olecranon process 24.3  15 cm proximal to ulnar styloid process   10 cm proximal to ulnar styloid process 20.3  Just proximal to ulnar styloid process 14.6  Across hand at thumb web space 18.8  At base of 2nd digit 5.9  (Blank rows = not tested)    GAIT: WNL  L-DEX LYMPHEDEMA SCREENING: The patient was assessed using  the L-Dex machine today to produce a lymphedema index baseline score. The patient will be reassessed on a regular basis (typically every 3 months) to obtain new L-Dex scores. If the score is > 6.5 points away from his/her baseline score indicating onset of subclinical lymphedema, it will be recommended to wear a compression garment for 4 weeks, 12 hours per day and then be reassessed. If the score continues to be > 6.5 points from baseline at reassessment, we will initiate lymphedema treatment. Assessing in this manner has a 95% rate of preventing clinically significant lymphedema. 04/30/2023 SCORE TODAY -1.7  QUICK DASH SURVEY: 61%   TODAY'S TREATMENT:                                                                                                                                          DATE:  05/07/23: Manual therapy STM to Rt chest wall and lateral trunk, UT, and shoulder where pt palpably tight, mildly tender to touch at UT where less trigger points palpable today than at last session; used cocoa butter and pt was in varying positions during session from supine, to Lt S/L then finished in supine; also worked on [employer] and cervical paraspinals and UT with cervical P/ROM throughout session MFR to Rt chest wall  before use of cocoa butter and at Rt medial upper arm where tightness palpable P/ROM in supine of Lt shoulder intermittently during STM into flexion, abd and D2 with scapular depression by therapist throughout, pt was very tight due to muscle guarding which limits her end motions and multiple VC's to relax due to muscle guarding; also cervical Lt side bend with trigger point release to Rt UT and SCM  05/02/23: Manual therapy STM to Rt chest wall, UT, and shoulder where pt palpably tight, very tender to touch at UT where multiple trigger points palpable; used cocoa butter and pt was in varying positions during session from supine, to Lt S/L, prone and then finished in supine to Rt SCM and  cervical paraspinals and UT with cervical P/ROM MFR to Rt chest wall before use of cocoa butter and at Rt medial upper arm where tightness palpable P/ROM in supine of Lt shoulder intermittently during STM into flexion, abd and D2 with scapular depression by therapist throughout, pt was very tight due to muscle guarding which limits her end motions and multiple VC's to relax due to muscle guarding; also cervical Lt side bend with trigger point release to Rt UT  04/30/23 No treatment today due to time. Discussed POC, DN, mobilizations, STM, PROM, strength    PATIENT EDUCATION:  Education details: Discussed POC, DN mobilizations, STM, PROM, strength, decrease UT compensation Person educated: Patient Education method: Explanation Education comprehension: verbalized understanding  HOME EXERCISE PROGRAM: None given  ASSESSMENT:  CLINICAL IMPRESSION: Continued with manual therapy as described above. Pt with less reported tenderness today at Rt UT and less palpable trigger points. She does continue to present with tightness and pain into Rt medial upper arm that limits her current end P/ROMs. Her tenderness and tightness along Rt lateral trunk was also improved today as well. Pt reports has been doing open book stretch in Lt S/L for Rt UE at home.    OBJECTIVE IMPAIRMENTS: decreased knowledge of condition, decreased ROM, decreased strength, increased fascial restrictions, impaired flexibility, impaired UE functional use, postural dysfunction, and pain.   ACTIVITY LIMITATIONS: lifting, sleeping, and reach over head  PARTICIPATION LIMITATIONS:  [place] dancing, working out  PERSONAL FACTORS: 1-2 comorbidities: Right breast cancer s/p chemotherapy and radiation  are also affecting patient's functional outcome.   REHAB POTENTIAL: Good  CLINICAL DECISION MAKING: Stable/uncomplicated  EVALUATION COMPLEXITY: Low  GOALS: Goals reviewed with patient? Yes  SHORT TERM GOALS: Target date:  05/28/2023  Independent with HEP for Right shoulder ROM, strength/stabilization Baseline: Goal status: INITIAL  2.  Decreased pain in Right UT/pecs/ medial arm by 25 % Baseline:  Goal status: INITIAL  3.  Shoulder flexion and abd improved by 10-12 degrees for improved reaching Baseline:  Goal status: INITIAL Improved shoulder flexion and abd by 15-20 degrees  4.  Improved right scapulo -humeral rhythm and with decreased UT compensation Baseline:  Goal status: INITIAL   LONG TERM GOALS: Target date: 06/25/23  Right UT/pec/arm pain improved by atleast 50%  Baseline:  Goal status: INITIAL  2.  Quick dash no greater than 20%for improved function Baseline:  Goal status: INITIAL  3.   Able to perform [place] dancing with correct arm position without increased pain/compensation Baseline:  Goal status: INITIAL   4.    Improved right scapulo -humeral rhythm and with decreased UT compensation Baseline:  Goal status: INITIAL   5.  Pt will have improved ability to sleep on the right side without increased pain. Baseline:  Goal status: INITIAL  PLAN:  PT FREQUENCY: 2x/week  PT DURATION: 8 weeks  PLANNED INTERVENTIONS: Therapeutic exercises, Therapeutic activity, Neuromuscular re-education, Patient/Family education, Self Care, Joint mobilization, Dry Needling, Manual lymph drainage, scar mobilization, Ionotophoresis 4mg /ml Dexamethasone, Manual therapy, and Re-evaluation  PLAN FOR NEXT SESSION: .UT/levator stretches, DN to UT/pecs and other areas prn, STM prn, scapular and GH mobs, PROM, lower trap strengthening to decrease compensation, functional training  Hermenia Bers, PTA 05/07/2023, 9:03 AM

## 2023-05-09 ENCOUNTER — Ambulatory Visit: Payer: 59

## 2023-05-09 DIAGNOSIS — Z170 Estrogen receptor positive status [ER+]: Secondary | ICD-10-CM

## 2023-05-09 DIAGNOSIS — C50811 Malignant neoplasm of overlapping sites of right female breast: Secondary | ICD-10-CM | POA: Diagnosis not present

## 2023-05-09 DIAGNOSIS — I890 Lymphedema, not elsewhere classified: Secondary | ICD-10-CM

## 2023-05-09 DIAGNOSIS — Z483 Aftercare following surgery for neoplasm: Secondary | ICD-10-CM

## 2023-05-09 DIAGNOSIS — M25611 Stiffness of right shoulder, not elsewhere classified: Secondary | ICD-10-CM

## 2023-05-09 NOTE — Patient Instructions (Signed)

## 2023-05-09 NOTE — Therapy (Signed)
OUTPATIENT PHYSICAL THERAPY  UPPER EXTREMITY ONCOLOGY TREATMENT  Patient Name: Allison Reed MRN: [id number] DOB:October 21, 1976, 47 y.o., female Today's Date: 05/09/2023  END OF SESSION:  PT End of Session - 05/09/23 0905     Visit Number 4    Number of Visits 16    Date for PT Re-Evaluation 06/25/23    PT Start Time 0901    PT Stop Time 0957    PT Time Calculation (min) 56 min    Activity Tolerance Patient tolerated treatment well    Behavior During Therapy [facility] for tasks assessed/performed             Past Medical History:  Diagnosis Date   Diabetes mellitus without complication (HCC)    Type 2   Family history of brain tumor    Family history of skin cancer    Gestational diabetes    Headache    otc med prn   Heartburn in pregnancy    Incompetent cervix in pregnancy 04/2015   Missed abortion    no surgery required   Personal history of chemotherapy    Personal history of radiation therapy    Postpartum care following cesarean delivery (11/2) 10/12/2015   right breast ca 06/2021   UTI in pregnancy 03/2015   recent UTI, treatment completed   Past Surgical History:  Procedure Laterality Date   APPENDECTOMY  12/10/2004   BREAST BIOPSY Right 2022   BREAST BIOPSY Left 2022   BREAST RECONSTRUCTION WITH PLACEMENT OF TISSUE EXPANDER AND ALLODERM Right 07/25/2021   Procedure: RIGHT BREAST RECONSTRUCTION WITH PLACEMENT OF TISSUE EXPANDER AND ALLODERM;  Surgeon: Glenna Fellows, MD;  Location: [facility];  Service: Plastics;  Laterality: Right;   CERCLAGE LAPAROSCOPIC ABDOMINAL N/A 04/15/2015   Procedure: LAPAROSCOPIC TRANSABDOMINAL CERVCOISTHMIC CERCLAGE;  Surgeon: Fermin Schwab, MD;  Location: [facility];  Service: Gynecology;  Laterality: N/A;   CERVICAL CERCLAGE  02/08/2011   vaginal   CESAREAN SECTION N/A 10/12/2015   Procedure: CESAREAN SECTION;  Surgeon: Genia Del, MD;  Location: [facility];  Service: Obstetrics;  Laterality: N/A;   CESAREAN  SECTION N/A 03/03/2018   Procedure: Repeat CESAREAN SECTION/Removal of Abdominal Cerclage;  Surgeon: Shea Evans, MD;  Location: [facility];  Service: Obstetrics;  Laterality: N/A;  EDD: 03/28/18   MASTECTOMY Right 2022   MASTECTOMY W/ SENTINEL NODE BIOPSY Right 07/25/2021   Procedure: RIGHT MASTECTOMY WITH AXILLARY SENTINEL LYMPH NODE BIOPSY;  Surgeon: Emelia Loron, MD;  Location: [facility];  Service: General;  Laterality: Right;   PORTACATH PLACEMENT N/A 08/24/2021   Procedure: INSERTION PORT-A-CATH;  Surgeon: Emelia Loron, MD;  Location: [facility];  Service: General;  Laterality: N/A;   RADIOACTIVE SEED GUIDED AXILLARY SENTINEL LYMPH NODE Right 07/25/2021   Procedure: RADIOACTIVE SEED GUIDED RIGHT AXILLARY NODE EXCISION;  Surgeon: Emelia Loron, MD;  Location: [facility];  Service: General;  Laterality: Right;   TISSUE EXPANDER PLACEMENT Right 12/04/2021   Procedure: REMOVAL RIGHT CHEST TISSUE EXPANDER;  Surgeon: Glenna Fellows, MD;  Location: [facility];  Service: Plastics;  Laterality: Right;   WISDOM TOOTH EXTRACTION  12/11/1999   Patient Active Problem List   Diagnosis Date Noted   Lower leg mass, left 05/02/2022   Port-A-Cath in place 08/25/2021   S/P mastectomy, right 07/25/2021   Genetic testing 07/01/2021   Family history of skin cancer 06/22/2021   Family history of brain tumor 06/22/2021   Malignant neoplasm of overlapping sites of right breast in female,  estrogen receptor positive (HCC) 06/21/2021   Status post repeat low transverse cesarean section 03/03/2018   DM (diabetes mellitus) in pregnancy 03/02/2018   Incompetent cervix in pregnancy 03/02/2018    PCP: Dr. Adrian Prince, MD  REFERRING PROVIDER: [name], MD  REFERRING DIAG: S/p Right Mastectomy  THERAPY DIAG:  Malignant neoplasm of overlapping sites of right breast in female, estrogen receptor positive [facility])  Aftercare following  surgery for neoplasm  Stiffness of right shoulder, not elsewhere classified  Lymphedema, not elsewhere classified  ONSET DATE: April 22,2024  Rationale for Evaluation and Treatment: Rehabilitation  SUBJECTIVE:                                                                                                                                                                                           SUBJECTIVE STATEMENT:  I can bring my Rt arm further back when I stretch into the open book stretch now but I still really feel the pull at my chest/pectoralis. I also still can't sleep on my Rt side from the discomfort.   PERTINENT HISTORY:  Pt is s/p Right Mastectomy with Tissue Expander and 4+/7 LN on 07/25/21. She is s/p 6 months of chemo followed by radiation. She had emergency surgery to remove tissue expander due to infection on 12/04/2021. She completed radiation on 04/04/22. She will be having right breast reconstruction on 06/24/2023.  PAIN:  Are you having pain? Yes NPRS scale: 3-4/10 Pain location: posterior shoulder/ stretching arm medial right arm Pain orientation: Right  PAIN TYPE: heavy Pain description: intermittent, sharp, and aching , traps constant pain Aggravating factors: stretching arm, sleeping on right, big movements with right UE Relieving factors: rest, not stretching, MLD, compression sleeve  PRECAUTIONS: Right lymphedema risk  WEIGHT BEARING RESTRICTIONS: No  FALLS:  Has patient fallen in last 6 months? No  LIVING ENVIRONMENT: Lives with: husband and 2 children Lives in: House/apartment Stairs: Yes; Internal: 14 steps; can reach both and External: 0 steps; none   OCCUPATION: Administrative Asst, works from home  LEISURE: walking, work out with trainer,  HAND DOMINANCE: right   PRIOR LEVEL OF FUNCTION: Independent  PATIENT GOALS: Get my mobility back, decrease pain   OBJECTIVE:  COGNITION: Overall cognitive status: Within functional limits for tasks  assessed   PALPATION: Very tender right UT, supraspinatus  OBSERVATIONS / OTHER ASSESSMENTS: Right UT compensation, decreased right scapular mobility with shoulder ROM  SENSATION: Light touch: Appears intact    POSTURE: forward head, rounded shoulders, trunk rotation  UPPER EXTREMITY AROM/PROM:  A/PROM RIGHT   eval   Shoulder extension 25 (pain ant. shoulder  Shoulder flexion 132, UT  compensation  Shoulder abduction 123, tight UT/arm  Shoulder internal rotation   Shoulder external rotation     (Blank rows = not tested)  A/PROM LEFT   eval  Shoulder extension 41  Shoulder flexion 153  Shoulder abduction 178  Shoulder internal rotation 72  Shoulder external rotation 95    (Blank rows = not tested)  CERVICAL AROM:WNL except tightness noted in bilateral UT  UPPER EXTREMITY STRENGTH:   LYMPHEDEMA ASSESSMENTS:   SURGERY TYPE/DATE: 07/25/2021 Right Mastectomy with SLNB and tissue expander, Tissue expander removed 12/04/21  NUMBER OF LYMPH NODES REMOVED: 4/7  CHEMOTHERAPY: YES, 08/25/21-01/19/22  RADIATION:YES, 02/26/22-04/04/2022  HORMONE TREATMENT: YES  INFECTIONS: Yes with need for tissue expander to be removed   LYMPHEDEMA ASSESSMENTS:   LANDMARK RIGHT  eval  At axilla  32.4  15 cm proximal to olecranon process   10 cm proximal to olecranon process 25.6  Olecranon process 24.7  15 cm proximal to ulnar styloid process   10 cm proximal to ulnar styloid process 21.0  Just proximal to ulnar styloid process 15.3  Across hand at thumb web space 19.1  At base of 2nd digit 5.8  (Blank rows = not tested)  LANDMARK LEFT  eval  At axilla  29  15 cm proximal to olecranon process   10 cm proximal to olecranon process 24.2  Olecranon process 24.3  15 cm proximal to ulnar styloid process   10 cm proximal to ulnar styloid process 20.3  Just proximal to ulnar styloid process 14.6  Across hand at thumb web space 18.8  At base of 2nd digit 5.9  (Blank rows = not  tested)    GAIT: WNL  L-DEX LYMPHEDEMA SCREENING: The patient was assessed using the L-Dex machine today to produce a lymphedema index baseline score. The patient will be reassessed on a regular basis (typically every 3 months) to obtain new L-Dex scores. If the score is > 6.5 points away from his/her baseline score indicating onset of subclinical lymphedema, it will be recommended to wear a compression garment for 4 weeks, 12 hours per day and then be reassessed. If the score continues to be > 6.5 points from baseline at reassessment, we will initiate lymphedema treatment. Assessing in this manner has a 95% rate of preventing clinically significant lymphedema. 04/30/2023 SCORE TODAY -1.7  QUICK DASH SURVEY: 61%   TODAY'S TREATMENT:                                                                                                                                          DATE:  05/09/23: Trigger Point Dry-Needling  Treatment instructions: Expect mild to moderate muscle soreness. S/S of pneumothorax if dry needled over a lung field, and to seek immediate medical attention should they occur. Patient verbalized understanding of these instructions and education.  Patient Consent Given: Yes Education handout provided: Yes Muscles treated: Right upper trapezius  and right pectoralis major in prone and supine Electrical stimulation performed: No Parameters: N/A Treatment response/outcome: Patient reported mild discomfort "It kind of feels like a massage" during dry needling. Very slight bleeding at one spot on her upper trap which immediately resolved. Deep ache reported. Bethann Punches, [place] 05/09/23 10:11 AM Manual therapy (continued by PTA) STM with cocoa butter to Rt chest wall and lateral trunk, UT, and shoulder where pt palpably tight, mildly tender to touch at UT where less trigger points continue to feel improved; pt was in varying positions during session from supine, to Lt S/L then  finished in supine; also worked on Rt SCM and cervical paraspinals and UT with cervical P/ROM throughout session MFR to Rt chest wall before use of cocoa butter P/ROM in supine of Lt shoulder intermittently during STM into flexion, abd and D2 with scapular depression by therapist throughout, pt was very tight due to muscle guarding which limits her end motions and multiple VC's to relax due to muscle guarding; also cervical Lt side bend with trigger point release to Rt UT and SCM    05/07/23: Manual therapy STM to Rt chest wall and lateral trunk, UT, and shoulder where pt palpably tight, mildly tender to touch at UT where less trigger points palpable today than at last session; used cocoa butter and pt was in varying positions during session from supine, to Lt S/L then finished in supine; also worked on [employer] and cervical paraspinals and UT with cervical P/ROM throughout session MFR to Rt chest wall before use of cocoa butter and at Rt medial upper arm where tightness palpable P/ROM in supine of Lt shoulder intermittently during STM into flexion, abd and D2 with scapular depression by therapist throughout, pt was very tight due to muscle guarding which limits her end motions and multiple VC's to relax due to muscle guarding; also cervical Lt side bend with trigger point release to Rt UT and SCM  05/02/23: Manual therapy STM to Rt chest wall, UT, and shoulder where pt palpably tight, very tender to touch at UT where multiple trigger points palpable; used cocoa butter and pt was in varying positions during session from supine, to Lt S/L, prone and then finished in supine to Rt SCM and cervical paraspinals and UT with cervical P/ROM MFR to Rt chest wall before use of cocoa butter and at Rt medial upper arm where tightness palpable P/ROM in supine of Lt shoulder intermittently during STM into flexion, abd and D2 with scapular depression by therapist throughout, pt was very tight due to muscle guarding  which limits her end motions and multiple VC's to relax due to muscle guarding; also cervical Lt side bend with trigger point release to Rt UT     PATIENT EDUCATION:  Education details: Discussed POC, DN mobilizations, STM, PROM, strength, decrease UT compensation Person educated: Patient Education method: Explanation Education comprehension: verbalized understanding  HOME EXERCISE PROGRAM: None given  ASSESSMENT:  CLINICAL IMPRESSION: First trial with dry needling.  Then continued with manual therapy working to decrease her overall Rt upper quadrant tightness. She seemed to have increased difficulty relaxing due to muscle guarding but this can be normal after dry needling. Her Rt shoulder abduction, though still limited by pain and tightness, was some improved today. She is noticing her Lt UT tightness has greatly improved and her general Rt upper quadrant tightness is beginning to show improvement.    OBJECTIVE IMPAIRMENTS: decreased knowledge of condition, decreased ROM, decreased strength, increased fascial  restrictions, impaired flexibility, impaired UE functional use, postural dysfunction, and pain.   ACTIVITY LIMITATIONS: lifting, sleeping, and reach over head  PARTICIPATION LIMITATIONS:  [place] dancing, working out  PERSONAL FACTORS: 1-2 comorbidities: Right breast cancer s/p chemotherapy and radiation  are also affecting patient's functional outcome.   REHAB POTENTIAL: Good  CLINICAL DECISION MAKING: Stable/uncomplicated  EVALUATION COMPLEXITY: Low  GOALS: Goals reviewed with patient? Yes  SHORT TERM GOALS: Target date: 05/28/2023  Independent with HEP for Right shoulder ROM, strength/stabilization Baseline: Goal status: INITIAL  2.  Decreased pain in Right UT/pecs/ medial arm by 25 % Baseline:  Goal status: INITIAL  3.  Shoulder flexion and abd improved by 10-12 degrees for improved reaching Baseline:  Goal status: INITIAL Improved shoulder flexion and abd  by 15-20 degrees  4.  Improved right scapulo -humeral rhythm and with decreased UT compensation Baseline:  Goal status: INITIAL   LONG TERM GOALS: Target date: 06/25/23  Right UT/pec/arm pain improved by atleast 50%  Baseline:  Goal status: INITIAL  2.  Quick dash no greater than 20%for improved function Baseline:  Goal status: INITIAL  3.   Able to perform [place] dancing with correct arm position without increased pain/compensation Baseline:  Goal status: INITIAL   4.    Improved right scapulo -humeral rhythm and with decreased UT compensation Baseline:  Goal status: INITIAL   5.  Pt will have improved ability to sleep on the right side without increased pain. Baseline:  Goal status: INITIAL  PLAN:  PT FREQUENCY: 2x/week  PT DURATION: 8 weeks  PLANNED INTERVENTIONS: Therapeutic exercises, Therapeutic activity, Neuromuscular re-education, Patient/Family education, Self Care, Joint mobilization, Dry Needling, Manual lymph drainage, scar mobilization, Ionotophoresis 4mg /ml Dexamethasone, Manual therapy, and Re-evaluation  PLAN FOR NEXT SESSION: How was soreness after DN? UT/levator stretches, cont DN to UT/pecs and other areas prn, STM prn, scapular and GH mobs, PROM, lower trap strengthening to decrease compensation, functional training  Hermenia Bers, PTA 05/09/2023, 10:11 AM

## 2023-05-15 ENCOUNTER — Ambulatory Visit: Payer: 59 | Admitting: Physical Therapy

## 2023-05-16 ENCOUNTER — Ambulatory Visit: Payer: 59 | Attending: Hematology and Oncology

## 2023-05-16 DIAGNOSIS — M25611 Stiffness of right shoulder, not elsewhere classified: Secondary | ICD-10-CM | POA: Insufficient documentation

## 2023-05-16 DIAGNOSIS — C50811 Malignant neoplasm of overlapping sites of right female breast: Secondary | ICD-10-CM | POA: Diagnosis present

## 2023-05-16 DIAGNOSIS — Z483 Aftercare following surgery for neoplasm: Secondary | ICD-10-CM | POA: Insufficient documentation

## 2023-05-16 DIAGNOSIS — R293 Abnormal posture: Secondary | ICD-10-CM | POA: Diagnosis present

## 2023-05-16 DIAGNOSIS — Z170 Estrogen receptor positive status [ER+]: Secondary | ICD-10-CM | POA: Insufficient documentation

## 2023-05-16 DIAGNOSIS — I890 Lymphedema, not elsewhere classified: Secondary | ICD-10-CM | POA: Diagnosis present

## 2023-05-16 NOTE — Therapy (Signed)
OUTPATIENT PHYSICAL THERAPY  UPPER EXTREMITY ONCOLOGY TREATMENT  Patient Name: NATISHA CASSADA MRN: [id number] DOB:March 16, 1976, 47 y.o., female Today's Date: 05/16/2023  END OF SESSION:  PT End of Session - 05/16/23 0808     Visit Number 5    Number of Visits 16    Date for PT Re-Evaluation 06/25/23    PT Start Time 0803    PT Stop Time 0846    PT Time Calculation (min) 43 min    Activity Tolerance Patient tolerated treatment well    Behavior During Therapy [facility] for tasks assessed/performed             Past Medical History:  Diagnosis Date   Diabetes mellitus without complication (HCC)    Type 2   Family history of brain tumor    Family history of skin cancer    Gestational diabetes    Headache    otc med prn   Heartburn in pregnancy    Incompetent cervix in pregnancy 04/2015   Missed abortion    no surgery required   Personal history of chemotherapy    Personal history of radiation therapy    Postpartum care following cesarean delivery (11/2) 10/12/2015   right breast ca 06/2021   UTI in pregnancy 03/2015   recent UTI, treatment completed   Past Surgical History:  Procedure Laterality Date   APPENDECTOMY  12/10/2004   BREAST BIOPSY Right 2022   BREAST BIOPSY Left 2022   BREAST RECONSTRUCTION WITH PLACEMENT OF TISSUE EXPANDER AND ALLODERM Right 07/25/2021   Procedure: RIGHT BREAST RECONSTRUCTION WITH PLACEMENT OF TISSUE EXPANDER AND ALLODERM;  Surgeon: Glenna Fellows, MD;  Location: [facility];  Service: Plastics;  Laterality: Right;   CERCLAGE LAPAROSCOPIC ABDOMINAL N/A 04/15/2015   Procedure: LAPAROSCOPIC TRANSABDOMINAL CERVCOISTHMIC CERCLAGE;  Surgeon: Fermin Schwab, MD;  Location: [facility];  Service: Gynecology;  Laterality: N/A;   CERVICAL CERCLAGE  02/08/2011   vaginal   CESAREAN SECTION N/A 10/12/2015   Procedure: CESAREAN SECTION;  Surgeon: Genia Del, MD;  Location: [facility];  Service: Obstetrics;  Laterality: N/A;   CESAREAN  SECTION N/A 03/03/2018   Procedure: Repeat CESAREAN SECTION/Removal of Abdominal Cerclage;  Surgeon: Shea Evans, MD;  Location: [facility];  Service: Obstetrics;  Laterality: N/A;  EDD: 03/28/18   MASTECTOMY Right 2022   MASTECTOMY W/ SENTINEL NODE BIOPSY Right 07/25/2021   Procedure: RIGHT MASTECTOMY WITH AXILLARY SENTINEL LYMPH NODE BIOPSY;  Surgeon: Emelia Loron, MD;  Location: [facility];  Service: General;  Laterality: Right;   PORTACATH PLACEMENT N/A 08/24/2021   Procedure: INSERTION PORT-A-CATH;  Surgeon: Emelia Loron, MD;  Location: [facility];  Service: General;  Laterality: N/A;   RADIOACTIVE SEED GUIDED AXILLARY SENTINEL LYMPH NODE Right 07/25/2021   Procedure: RADIOACTIVE SEED GUIDED RIGHT AXILLARY NODE EXCISION;  Surgeon: Emelia Loron, MD;  Location: [facility];  Service: General;  Laterality: Right;   TISSUE EXPANDER PLACEMENT Right 12/04/2021   Procedure: REMOVAL RIGHT CHEST TISSUE EXPANDER;  Surgeon: Glenna Fellows, MD;  Location: [facility];  Service: Plastics;  Laterality: Right;   WISDOM TOOTH EXTRACTION  12/11/1999   Patient Active Problem List   Diagnosis Date Noted   Lower leg mass, left 05/02/2022   Port-A-Cath in place 08/25/2021   S/P mastectomy, right 07/25/2021   Genetic testing 07/01/2021   Family history of skin cancer 06/22/2021   Family history of brain tumor 06/22/2021   Malignant neoplasm of overlapping sites of right breast in female,  estrogen receptor positive (HCC) 06/21/2021   Status post repeat low transverse cesarean section 03/03/2018   DM (diabetes mellitus) in pregnancy 03/02/2018   Incompetent cervix in pregnancy 03/02/2018    PCP: Dr. Adrian Prince, MD  REFERRING PROVIDER: [name], MD  REFERRING DIAG: S/p Right Mastectomy  THERAPY DIAG:  Malignant neoplasm of overlapping sites of right breast in female, estrogen receptor positive [facility])  Aftercare following  surgery for neoplasm  Stiffness of right shoulder, not elsewhere classified  Lymphedema, not elsewhere classified  ONSET DATE: April 22,2024  Rationale for Evaluation and Treatment: Rehabilitation  SUBJECTIVE:                                                                                                                                                                                           SUBJECTIVE STATEMENT:  I came down with a bad cold with a fever right before my last appt this week and had to cancel last minute. I have noticed that my neck A/ROM is better since my first dry needling appt last week. I couldn't tell a big difference at my Rt scapula.   PERTINENT HISTORY:  Pt is s/p Right Mastectomy with Tissue Expander and 4+/7 LN on 07/25/21. She is s/p 6 months of chemo followed by radiation. She had emergency surgery to remove tissue expander due to infection on 12/04/2021. She completed radiation on 04/04/22. She will be having right breast reconstruction on 06/24/2023.  PAIN:  Are you having pain? No  PRECAUTIONS: Right lymphedema risk  WEIGHT BEARING RESTRICTIONS: No  FALLS:  Has patient fallen in last 6 months? No  LIVING ENVIRONMENT: Lives with: husband and 2 children Lives in: House/apartment Stairs: Yes; Internal: 14 steps; can reach both and External: 0 steps; none   OCCUPATION: Administrative Asst, works from home  LEISURE: walking, work out with trainer,  HAND DOMINANCE: right   PRIOR LEVEL OF FUNCTION: Independent  PATIENT GOALS: Get my mobility back, decrease pain   OBJECTIVE:  COGNITION: Overall cognitive status: Within functional limits for tasks assessed   PALPATION: Very tender right UT, supraspinatus  OBSERVATIONS / OTHER ASSESSMENTS: Right UT compensation, decreased right scapular mobility with shoulder ROM  SENSATION: Light touch: Appears intact    POSTURE: forward head, rounded shoulders, trunk rotation  UPPER EXTREMITY  AROM/PROM:  A/PROM RIGHT   eval   Shoulder extension 25 (pain ant. shoulder  Shoulder flexion 132, UT compensation  Shoulder abduction 123, tight UT/arm  Shoulder internal rotation   Shoulder external rotation     (Blank rows = not tested)  A/PROM LEFT   eval  Shoulder extension 41  Shoulder flexion  153  Shoulder abduction 178  Shoulder internal rotation 72  Shoulder external rotation 95    (Blank rows = not tested)  CERVICAL AROM:WNL except tightness noted in bilateral UT  UPPER EXTREMITY STRENGTH:   LYMPHEDEMA ASSESSMENTS:   SURGERY TYPE/DATE: 07/25/2021 Right Mastectomy with SLNB and tissue expander, Tissue expander removed 12/04/21  NUMBER OF LYMPH NODES REMOVED: 4/7  CHEMOTHERAPY: YES, 08/25/21-01/19/22  RADIATION:YES, 02/26/22-04/04/2022  HORMONE TREATMENT: YES  INFECTIONS: Yes with need for tissue expander to be removed   LYMPHEDEMA ASSESSMENTS:   LANDMARK RIGHT  eval  At axilla  32.4  15 cm proximal to olecranon process   10 cm proximal to olecranon process 25.6  Olecranon process 24.7  15 cm proximal to ulnar styloid process   10 cm proximal to ulnar styloid process 21.0  Just proximal to ulnar styloid process 15.3  Across hand at thumb web space 19.1  At base of 2nd digit 5.8  (Blank rows = not tested)  LANDMARK LEFT  eval  At axilla  29  15 cm proximal to olecranon process   10 cm proximal to olecranon process 24.2  Olecranon process 24.3  15 cm proximal to ulnar styloid process   10 cm proximal to ulnar styloid process 20.3  Just proximal to ulnar styloid process 14.6  Across hand at thumb web space 18.8  At base of 2nd digit 5.9  (Blank rows = not tested)    GAIT: WNL  L-DEX LYMPHEDEMA SCREENING: The patient was assessed using the L-Dex machine today to produce a lymphedema index baseline score. The patient will be reassessed on a regular basis (typically every 3 months) to obtain new L-Dex scores. If the score is > 6.5 points away from  his/her baseline score indicating onset of subclinical lymphedema, it will be recommended to wear a compression garment for 4 weeks, 12 hours per day and then be reassessed. If the score continues to be > 6.5 points from baseline at reassessment, we will initiate lymphedema treatment. Assessing in this manner has a 95% rate of preventing clinically significant lymphedema. 04/30/2023 SCORE TODAY -1.7  QUICK DASH SURVEY: 61%   TODAY'S TREATMENT:                                                                                                                                          DATE:  05/16/23: Manual therapy STM to Rt chest wall and lateral trunk, UT, and shoulder where pt palpably tight, moderately tender to touch at UT where a few trigger points palpable today than at last session; used cocoa butter and pt was in varying positions during session from supine, to Lt S/L then finished in supine; also worked on [employer] and cervical paraspinals and UT with cervical P/ROM throughout session P/ROM in supine of Lt shoulder intermittently during STM into flexion, abd and D2 with scapular depression by therapist throughout, pt was  very tight due to muscle guarding which limits her end motions and multiple VC's to relax due to muscle guarding; also cervical Lt side bend and rotation with trigger point release to Rt UT and SCM  05/09/23: Trigger Point Dry-Needling  Treatment instructions: Expect mild to moderate muscle soreness. S/S of pneumothorax if dry needled over a lung field, and to seek immediate medical attention should they occur. Patient verbalized understanding of these instructions and education.  Patient Consent Given: Yes Education handout provided: Yes Muscles treated: Right upper trapezius and right pectoralis major in prone and supine Electrical stimulation performed: No Parameters: N/A Treatment response/outcome: Patient reported mild discomfort "It kind of feels like a massage" during dry  needling. Very slight bleeding at one spot on her upper trap which immediately resolved. Deep ache reported. Bethann Punches, [place] 05/16/23 8:57 AM Manual therapy (continued by PTA) STM with cocoa butter to Rt chest wall and lateral trunk, UT, and shoulder where pt palpably tight, mildly tender to touch at UT where less trigger points continue to feel improved; pt was in varying positions during session from supine, to Lt S/L then finished in supine; also worked on Rt SCM and cervical paraspinals and UT with cervical P/ROM throughout session MFR to Rt chest wall before use of cocoa butter P/ROM in supine of Lt shoulder intermittently during STM into flexion, abd and D2 with scapular depression by therapist throughout, pt was very tight due to muscle guarding which limits her end motions and multiple VC's to relax due to muscle guarding; also cervical Lt side bend with trigger point release to Rt UT and SCM    05/07/23: Manual therapy STM to Rt chest wall and lateral trunk, UT, and shoulder where pt palpably tight, mildly tender to touch at UT where less trigger points palpable today than at last session; used cocoa butter and pt was in varying positions during session from supine, to Lt S/L then finished in supine; also worked on [employer] and cervical paraspinals and UT with cervical P/ROM throughout session MFR to Rt chest wall before use of cocoa butter and at Rt medial upper arm where tightness palpable P/ROM in supine of Lt shoulder intermittently during STM into flexion, abd and D2 with scapular depression by therapist throughout, pt was very tight due to muscle guarding which limits her end motions and multiple VC's to relax due to muscle guarding; also cervical Lt side bend with trigger point release to Rt UT and SCM      PATIENT EDUCATION:  Education details: Discussed POC, DN mobilizations, STM, PROM, strength, decrease UT compensation Person educated: Patient Education method:  Explanation Education comprehension: verbalized understanding  HOME EXERCISE PROGRAM: None given  ASSESSMENT:  CLINICAL IMPRESSION: Pt reports overall first trial of dry needling went well. She noticed improved end Lt cervical rotation and side bend with less pain after and mild soreness. Today she was a little tighter than last week but reports had a cold and fever Tuesday and was traveling over the weekend for 2 weddings so she hasn't been able to stretch as much as she normally does. Also had to cancel secondary needling appt earlier in the week due to sick. Less trigger points palpable in Rt UT by end of session and pt reports feeling a little looser. She has dry needling appt next week.    OBJECTIVE IMPAIRMENTS: decreased knowledge of condition, decreased ROM, decreased strength, increased fascial restrictions, impaired flexibility, impaired UE functional use, postural dysfunction, and pain.  ACTIVITY LIMITATIONS: lifting, sleeping, and reach over head  PARTICIPATION LIMITATIONS:  [place] dancing, working out  PERSONAL FACTORS: 1-2 comorbidities: Right breast cancer s/p chemotherapy and radiation  are also affecting patient's functional outcome.   REHAB POTENTIAL: Good  CLINICAL DECISION MAKING: Stable/uncomplicated  EVALUATION COMPLEXITY: Low  GOALS: Goals reviewed with patient? Yes  SHORT TERM GOALS: Target date: 05/28/2023  Independent with HEP for Right shoulder ROM, strength/stabilization Baseline: Goal status: INITIAL  2.  Decreased pain in Right UT/pecs/ medial arm by 25 % Baseline:  Goal status: INITIAL  3.  Shoulder flexion and abd improved by 10-12 degrees for improved reaching Baseline:  Goal status: INITIAL Improved shoulder flexion and abd by 15-20 degrees  4.  Improved right scapulo -humeral rhythm and with decreased UT compensation Baseline:  Goal status: INITIAL   LONG TERM GOALS: Target date: 06/25/23  Right UT/pec/arm pain improved by atleast  50%  Baseline:  Goal status: INITIAL  2.  Quick dash no greater than 20%for improved function Baseline:  Goal status: INITIAL  3.   Able to perform [place] dancing with correct arm position without increased pain/compensation Baseline:  Goal status: INITIAL   4.    Improved right scapulo -humeral rhythm and with decreased UT compensation Baseline:  Goal status: INITIAL   5.  Pt will have improved ability to sleep on the right side without increased pain. Baseline:  Goal status: INITIAL  PLAN:  PT FREQUENCY: 2x/week  PT DURATION: 8 weeks  PLANNED INTERVENTIONS: Therapeutic exercises, Therapeutic activity, Neuromuscular re-education, Patient/Family education, Self Care, Joint mobilization, Dry Needling, Manual lymph drainage, scar mobilization, Ionotophoresis 4mg /ml Dexamethasone, Manual therapy, and Re-evaluation  PLAN FOR NEXT SESSION: Cont UT/levator stretches, cont DN to UT/pecs and other areas prn, STM prn, scapular and GH mobs, PROM, lower trap strengthening to decrease compensation, functional training  Hermenia Bers, PTA 05/16/2023, 8:57 AM

## 2023-05-21 ENCOUNTER — Encounter: Payer: Self-pay | Admitting: Physical Therapy

## 2023-05-21 ENCOUNTER — Ambulatory Visit: Payer: 59 | Admitting: Physical Therapy

## 2023-05-21 DIAGNOSIS — R293 Abnormal posture: Secondary | ICD-10-CM

## 2023-05-21 DIAGNOSIS — M25611 Stiffness of right shoulder, not elsewhere classified: Secondary | ICD-10-CM

## 2023-05-21 DIAGNOSIS — C50811 Malignant neoplasm of overlapping sites of right female breast: Secondary | ICD-10-CM | POA: Diagnosis not present

## 2023-05-21 NOTE — Therapy (Signed)
OUTPATIENT PHYSICAL THERAPY  UPPER EXTREMITY ONCOLOGY TREATMENT  Patient Name: Allison Reed MRN: [id number] DOB:December 27, 1975, 47 y.o., female Today's Date: 05/21/2023  END OF SESSION:  PT End of Session - 05/21/23 0808     Visit Number 6    Number of Visits 16    Date for PT Re-Evaluation 06/25/23    PT Start Time 0808   pT LATE   PT Stop Time 0846    PT Time Calculation (min) 38 min    Activity Tolerance Patient tolerated treatment well    Behavior During Therapy [facility] for tasks assessed/performed              Past Medical History:  Diagnosis Date   Diabetes mellitus without complication (HCC)    Type 2   Family history of brain tumor    Family history of skin cancer    Gestational diabetes    Headache    otc med prn   Heartburn in pregnancy    Incompetent cervix in pregnancy 04/2015   Missed abortion    no surgery required   Personal history of chemotherapy    Personal history of radiation therapy    Postpartum care following cesarean delivery (11/2) 10/12/2015   right breast ca 06/2021   UTI in pregnancy 03/2015   recent UTI, treatment completed   Past Surgical History:  Procedure Laterality Date   APPENDECTOMY  12/10/2004   BREAST BIOPSY Right 2022   BREAST BIOPSY Left 2022   BREAST RECONSTRUCTION WITH PLACEMENT OF TISSUE EXPANDER AND ALLODERM Right 07/25/2021   Procedure: RIGHT BREAST RECONSTRUCTION WITH PLACEMENT OF TISSUE EXPANDER AND ALLODERM;  Surgeon: Glenna Fellows, MD;  Location: [facility];  Service: Plastics;  Laterality: Right;   CERCLAGE LAPAROSCOPIC ABDOMINAL N/A 04/15/2015   Procedure: LAPAROSCOPIC TRANSABDOMINAL CERVCOISTHMIC CERCLAGE;  Surgeon: Fermin Schwab, MD;  Location: [facility];  Service: Gynecology;  Laterality: N/A;   CERVICAL CERCLAGE  02/08/2011   vaginal   CESAREAN SECTION N/A 10/12/2015   Procedure: CESAREAN SECTION;  Surgeon: Genia Del, MD;  Location: [facility];  Service: Obstetrics;  Laterality: N/A;    CESAREAN SECTION N/A 03/03/2018   Procedure: Repeat CESAREAN SECTION/Removal of Abdominal Cerclage;  Surgeon: Shea Evans, MD;  Location: [facility];  Service: Obstetrics;  Laterality: N/A;  EDD: 03/28/18   MASTECTOMY Right 2022   MASTECTOMY W/ SENTINEL NODE BIOPSY Right 07/25/2021   Procedure: RIGHT MASTECTOMY WITH AXILLARY SENTINEL LYMPH NODE BIOPSY;  Surgeon: Emelia Loron, MD;  Location: [facility];  Service: General;  Laterality: Right;   PORTACATH PLACEMENT N/A 08/24/2021   Procedure: INSERTION PORT-A-CATH;  Surgeon: Emelia Loron, MD;  Location: [facility];  Service: General;  Laterality: N/A;   RADIOACTIVE SEED GUIDED AXILLARY SENTINEL LYMPH NODE Right 07/25/2021   Procedure: RADIOACTIVE SEED GUIDED RIGHT AXILLARY NODE EXCISION;  Surgeon: Emelia Loron, MD;  Location: [facility];  Service: General;  Laterality: Right;   TISSUE EXPANDER PLACEMENT Right 12/04/2021   Procedure: REMOVAL RIGHT CHEST TISSUE EXPANDER;  Surgeon: Glenna Fellows, MD;  Location: [facility];  Service: Plastics;  Laterality: Right;   WISDOM TOOTH EXTRACTION  12/11/1999   Patient Active Problem List   Diagnosis Date Noted   Lower leg mass, left 05/02/2022   Port-A-Cath in place 08/25/2021   S/P mastectomy, right 07/25/2021   Genetic testing 07/01/2021   Family history of skin cancer 06/22/2021   Family history of brain tumor 06/22/2021   Malignant neoplasm of overlapping sites of  right breast in female, estrogen receptor positive (HCC) 06/21/2021   Status post repeat low transverse cesarean section 03/03/2018   DM (diabetes mellitus) in pregnancy 03/02/2018   Incompetent cervix in pregnancy 03/02/2018    PCP: Dr. Adrian Prince, MD  REFERRING PROVIDER: [name], MD  REFERRING DIAG: S/p Right Mastectomy  THERAPY DIAG:  Stiffness of right shoulder, not elsewhere classified  Abnormal posture  ONSET DATE: April 22,2024  Rationale  for Evaluation and Treatment: Rehabilitation  SUBJECTIVE:                                                                                                                                                                                           SUBJECTIVE STATEMENT:  Pain is getting more unbearable.  DN helped but isn't a wow.  My neck is getting tight again along Rt side - I feel it when I turn my head each way.  PERTINENT HISTORY:  Pt is s/p Right Mastectomy with Tissue Expander and 4+/7 LN on 07/25/21. She is s/p 6 months of chemo followed by radiation. She had emergency surgery to remove tissue expander due to infection on 12/04/2021. She completed radiation on 04/04/22. She will be having right breast reconstruction on 06/24/2023.  PAIN:  Are you having pain? No  PRECAUTIONS: Right lymphedema risk  WEIGHT BEARING RESTRICTIONS: No  FALLS:  Has patient fallen in last 6 months? No  LIVING ENVIRONMENT: Lives with: husband and 2 children Lives in: House/apartment Stairs: Yes; Internal: 14 steps; can reach both and External: 0 steps; none   OCCUPATION: Administrative Asst, works from home  LEISURE: walking, work out with trainer,  HAND DOMINANCE: right   PRIOR LEVEL OF FUNCTION: Independent  PATIENT GOALS: Get my mobility back, decrease pain   OBJECTIVE:  COGNITION: Overall cognitive status: Within functional limits for tasks assessed   PALPATION: Very tender right UT, supraspinatus  OBSERVATIONS / OTHER ASSESSMENTS: Right UT compensation, decreased right scapular mobility with shoulder ROM  SENSATION: Light touch: Appears intact    POSTURE: forward head, rounded shoulders, trunk rotation  UPPER EXTREMITY AROM/PROM:  A/PROM RIGHT   eval   Shoulder extension 25 (pain ant. shoulder  Shoulder flexion 132, UT compensation  Shoulder abduction 123, tight UT/arm  Shoulder internal rotation   Shoulder external rotation     (Blank rows = not tested)  A/PROM LEFT    eval  Shoulder extension 41  Shoulder flexion 153  Shoulder abduction 178  Shoulder internal rotation 72  Shoulder external rotation 95    (Blank rows = not tested)  CERVICAL AROM:WNL except tightness noted in bilateral UT  UPPER EXTREMITY STRENGTH:  LYMPHEDEMA ASSESSMENTS:   SURGERY TYPE/DATE: 07/25/2021 Right Mastectomy with SLNB and tissue expander, Tissue expander removed 12/04/21  NUMBER OF LYMPH NODES REMOVED: 4/7  CHEMOTHERAPY: YES, 08/25/21-01/19/22  RADIATION:YES, 02/26/22-04/04/2022  HORMONE TREATMENT: YES  INFECTIONS: Yes with need for tissue expander to be removed   LYMPHEDEMA ASSESSMENTS:   LANDMARK RIGHT  eval  At axilla  32.4  15 cm proximal to olecranon process   10 cm proximal to olecranon process 25.6  Olecranon process 24.7  15 cm proximal to ulnar styloid process   10 cm proximal to ulnar styloid process 21.0  Just proximal to ulnar styloid process 15.3  Across hand at thumb web space 19.1  At base of 2nd digit 5.8  (Blank rows = not tested)  LANDMARK LEFT  eval  At axilla  29  15 cm proximal to olecranon process   10 cm proximal to olecranon process 24.2  Olecranon process 24.3  15 cm proximal to ulnar styloid process   10 cm proximal to ulnar styloid process 20.3  Just proximal to ulnar styloid process 14.6  Across hand at thumb web space 18.8  At base of 2nd digit 5.9  (Blank rows = not tested)    GAIT: WNL  L-DEX LYMPHEDEMA SCREENING: The patient was assessed using the L-Dex machine today to produce a lymphedema index baseline score. The patient will be reassessed on a regular basis (typically every 3 months) to obtain new L-Dex scores. If the score is > 6.5 points away from his/her baseline score indicating onset of subclinical lymphedema, it will be recommended to wear a compression garment for 4 weeks, 12 hours per day and then be reassessed. If the score continues to be > 6.5 points from baseline at reassessment, we will initiate  lymphedema treatment. Assessing in this manner has a 95% rate of preventing clinically significant lymphedema. 04/30/2023 SCORE TODAY -1.7  QUICK DASH SURVEY: 61%   TODAY'S TREATMENT:                                                                                                                                          DATE:  05/21/23: Cervical A/ROM bil Rot x 10  Rt shoulder flexion A/ROM x 5 Trigger Point Dry-Needling  Treatment instructions: Expect mild to moderate muscle soreness. S/S of pneumothorax if dry needled over a lung field, and to seek immediate medical attention should they occur. Patient verbalized understanding of these instructions and education.  Patient Consent Given: Yes Education handout provided: Previously provided Muscles treated: Rt upper trap, Rt infraspinatus, Rt lat, Rt posterior delt and teres minor/major Electrical stimulation performed: No Parameters: N/A Treatment response/outcome: signif twitch and release Rt upper trap and lat, improved neck ROM  Demo'd levator scapula stretch with overpressure to add to HEP - unable to add due to out of time - add next time  05/16/23: Manual therapy STM to Rt chest  wall and lateral trunk, UT, and shoulder where pt palpably tight, moderately tender to touch at UT where a few trigger points palpable today than at last session; used cocoa butter and pt was in varying positions during session from supine, to Lt S/L then finished in supine; also worked on [employer] and cervical paraspinals and UT with cervical P/ROM throughout session P/ROM in supine of Lt shoulder intermittently during STM into flexion, abd and D2 with scapular depression by therapist throughout, pt was very tight due to muscle guarding which limits her end motions and multiple VC's to relax due to muscle guarding; also cervical Lt side bend and rotation with trigger point release to Rt UT and SCM  05/09/23: Trigger Point Dry-Needling  Treatment instructions:  Expect mild to moderate muscle soreness. S/S of pneumothorax if dry needled over a lung field, and to seek immediate medical attention should they occur. Patient verbalized understanding of these instructions and education.  Patient Consent Given: Yes Education handout provided: Yes Muscles treated: Right upper trapezius and right pectoralis major in prone and supine Electrical stimulation performed: No Parameters: N/A Treatment response/outcome: Patient reported mild discomfort "It kind of feels like a massage" during dry needling. Very slight bleeding at one spot on her upper trap which immediately resolved. Deep ache reported. Bethann Punches, [place] 05/21/23 8:45 AM Manual therapy (continued by PTA) STM with cocoa butter to Rt chest wall and lateral trunk, UT, and shoulder where pt palpably tight, mildly tender to touch at UT where less trigger points continue to feel improved; pt was in varying positions during session from supine, to Lt S/L then finished in supine; also worked on Rt SCM and cervical paraspinals and UT with cervical P/ROM throughout session MFR to Rt chest wall before use of cocoa butter P/ROM in supine of Lt shoulder intermittently during STM into flexion, abd and D2 with scapular depression by therapist throughout, pt was very tight due to muscle guarding which limits her end motions and multiple VC's to relax due to muscle guarding; also cervical Lt side bend with trigger point release to Rt UT and SCM    PATIENT EDUCATION:  Education details: Discussed POC, DN mobilizations, STM, PROM, strength, decrease UT compensation Person educated: Patient Education method: Explanation Education comprehension: verbalized understanding  HOME EXERCISE PROGRAM: None given  ASSESSMENT:  CLINICAL IMPRESSION: Pt with good response to DN # 2 today with most reactivity in TP in Rt upper trap and lat.  She has very dense posterior shoulder stiffness but no twitches along these  muscles.  She will likely benefit from more shoulder mobs and needs to initiate lower trap setting/strength in upcoming appts.   OBJECTIVE IMPAIRMENTS: decreased knowledge of condition, decreased ROM, decreased strength, increased fascial restrictions, impaired flexibility, impaired UE functional use, postural dysfunction, and pain.   ACTIVITY LIMITATIONS: lifting, sleeping, and reach over head  PARTICIPATION LIMITATIONS:  [place] dancing, working out  PERSONAL FACTORS: 1-2 comorbidities: Right breast cancer s/p chemotherapy and radiation  are also affecting patient's functional outcome.   REHAB POTENTIAL: Good  CLINICAL DECISION MAKING: Stable/uncomplicated  EVALUATION COMPLEXITY: Low  GOALS: Goals reviewed with patient? Yes  SHORT TERM GOALS: Target date: 05/28/2023  Independent with HEP for Right shoulder ROM, strength/stabilization Baseline: Goal status: INITIAL  2.  Decreased pain in Right UT/pecs/ medial arm by 25 % Baseline:  Goal status: INITIAL  3.  Shoulder flexion and abd improved by 10-12 degrees for improved reaching Baseline:  Goal status: INITIAL Improved shoulder flexion  and abd by 15-20 degrees  4.  Improved right scapulo -humeral rhythm and with decreased UT compensation Baseline:  Goal status: INITIAL   LONG TERM GOALS: Target date: 06/25/23  Right UT/pec/arm pain improved by atleast 50%  Baseline:  Goal status: INITIAL  2.  Quick dash no greater than 20%for improved function Baseline:  Goal status: INITIAL  3.   Able to perform [place] dancing with correct arm position without increased pain/compensation Baseline:  Goal status: INITIAL   4.    Improved right scapulo -humeral rhythm and with decreased UT compensation Baseline:  Goal status: INITIAL   5.  Pt will have improved ability to sleep on the right side without increased pain. Baseline:  Goal status: INITIAL  PLAN:  PT FREQUENCY: 2x/week  PT DURATION: 8 weeks  PLANNED  INTERVENTIONS: Therapeutic exercises, Therapeutic activity, Neuromuscular re-education, Patient/Family education, Self Care, Joint mobilization, Dry Needling, Manual lymph drainage, scar mobilization, Ionotophoresis 4mg /ml Dexamethasone, Manual therapy, and Re-evaluation  PLAN FOR NEXT SESSION: Cont UT/levator stretches, cont DN to UT/pecs and other areas prn, STM prn, scapular and GH mobs, PROM, lower trap strengthening to decrease compensation, functional training  Samah Lapiana, PT 05/21/23 8:46 AM

## 2023-05-22 ENCOUNTER — Ambulatory Visit: Payer: 59

## 2023-05-23 ENCOUNTER — Ambulatory Visit: Payer: 59

## 2023-05-23 DIAGNOSIS — M25611 Stiffness of right shoulder, not elsewhere classified: Secondary | ICD-10-CM

## 2023-05-23 DIAGNOSIS — R293 Abnormal posture: Secondary | ICD-10-CM

## 2023-05-23 DIAGNOSIS — C50811 Malignant neoplasm of overlapping sites of right female breast: Secondary | ICD-10-CM | POA: Diagnosis not present

## 2023-05-23 DIAGNOSIS — Z483 Aftercare following surgery for neoplasm: Secondary | ICD-10-CM

## 2023-05-23 DIAGNOSIS — Z170 Estrogen receptor positive status [ER+]: Secondary | ICD-10-CM

## 2023-05-23 NOTE — Therapy (Signed)
OUTPATIENT PHYSICAL THERAPY  UPPER EXTREMITY ONCOLOGY TREATMENT  Patient Name: Allison Reed MRN: [id number] DOB:08-Apr-1976, 47 y.o., female Today's Date: 05/23/2023  END OF SESSION:  PT End of Session - 05/23/23 0910     Visit Number 7    Number of Visits 16    Date for PT Re-Evaluation 06/25/23    PT Start Time 0905    PT Stop Time 1000    PT Time Calculation (min) 55 min    Activity Tolerance Patient tolerated treatment well    Behavior During Therapy [facility] for tasks assessed/performed              Past Medical History:  Diagnosis Date   Diabetes mellitus without complication (HCC)    Type 2   Family history of brain tumor    Family history of skin cancer    Gestational diabetes    Headache    otc med prn   Heartburn in pregnancy    Incompetent cervix in pregnancy 04/2015   Missed abortion    no surgery required   Personal history of chemotherapy    Personal history of radiation therapy    Postpartum care following cesarean delivery (11/2) 10/12/2015   right breast ca 06/2021   UTI in pregnancy 03/2015   recent UTI, treatment completed   Past Surgical History:  Procedure Laterality Date   APPENDECTOMY  12/10/2004   BREAST BIOPSY Right 2022   BREAST BIOPSY Left 2022   BREAST RECONSTRUCTION WITH PLACEMENT OF TISSUE EXPANDER AND ALLODERM Right 07/25/2021   Procedure: RIGHT BREAST RECONSTRUCTION WITH PLACEMENT OF TISSUE EXPANDER AND ALLODERM;  Surgeon: Glenna Fellows, MD;  Location: [facility];  Service: Plastics;  Laterality: Right;   CERCLAGE LAPAROSCOPIC ABDOMINAL N/A 04/15/2015   Procedure: LAPAROSCOPIC TRANSABDOMINAL CERVCOISTHMIC CERCLAGE;  Surgeon: Fermin Schwab, MD;  Location: [facility];  Service: Gynecology;  Laterality: N/A;   CERVICAL CERCLAGE  02/08/2011   vaginal   CESAREAN SECTION N/A 10/12/2015   Procedure: CESAREAN SECTION;  Surgeon: Genia Del, MD;  Location: [facility];  Service: Obstetrics;  Laterality: N/A;   CESAREAN  SECTION N/A 03/03/2018   Procedure: Repeat CESAREAN SECTION/Removal of Abdominal Cerclage;  Surgeon: Shea Evans, MD;  Location: [facility];  Service: Obstetrics;  Laterality: N/A;  EDD: 03/28/18   MASTECTOMY Right 2022   MASTECTOMY W/ SENTINEL NODE BIOPSY Right 07/25/2021   Procedure: RIGHT MASTECTOMY WITH AXILLARY SENTINEL LYMPH NODE BIOPSY;  Surgeon: Emelia Loron, MD;  Location: [facility];  Service: General;  Laterality: Right;   PORTACATH PLACEMENT N/A 08/24/2021   Procedure: INSERTION PORT-A-CATH;  Surgeon: Emelia Loron, MD;  Location: [facility];  Service: General;  Laterality: N/A;   RADIOACTIVE SEED GUIDED AXILLARY SENTINEL LYMPH NODE Right 07/25/2021   Procedure: RADIOACTIVE SEED GUIDED RIGHT AXILLARY NODE EXCISION;  Surgeon: Emelia Loron, MD;  Location: [facility];  Service: General;  Laterality: Right;   TISSUE EXPANDER PLACEMENT Right 12/04/2021   Procedure: REMOVAL RIGHT CHEST TISSUE EXPANDER;  Surgeon: Glenna Fellows, MD;  Location: [facility];  Service: Plastics;  Laterality: Right;   WISDOM TOOTH EXTRACTION  12/11/1999   Patient Active Problem List   Diagnosis Date Noted   Lower leg mass, left 05/02/2022   Port-A-Cath in place 08/25/2021   S/P mastectomy, right 07/25/2021   Genetic testing 07/01/2021   Family history of skin cancer 06/22/2021   Family history of brain tumor 06/22/2021   Malignant neoplasm of overlapping sites of right breast in  female, estrogen receptor positive (HCC) 06/21/2021   Status post repeat low transverse cesarean section 03/03/2018   DM (diabetes mellitus) in pregnancy 03/02/2018   Incompetent cervix in pregnancy 03/02/2018    PCP: Dr. Adrian Prince, MD  REFERRING PROVIDER: [name], MD  REFERRING DIAG: S/p Right Mastectomy  THERAPY DIAG:  Stiffness of right shoulder, not elsewhere classified  Abnormal posture  Malignant neoplasm of overlapping sites of right  breast in female, estrogen receptor positive [facility])  Aftercare following surgery for neoplasm  ONSET DATE: April 22,2024  Rationale for Evaluation and Treatment: Rehabilitation  SUBJECTIVE:                                                                                                                                                                                           SUBJECTIVE STATEMENT:  The DN was really effective last time. [name] did a great job. I had to cancel my last appt just because of stress I was having in my personal life. But overall my neck is feeling much better, my A/ROM is better and today I just still have some tightness at my Rt UT.   PERTINENT HISTORY:  Pt is s/p Right Mastectomy with Tissue Expander and 4+/7 LN on 07/25/21. She is s/p 6 months of chemo followed by radiation. She had emergency surgery to remove tissue expander due to infection on 12/04/2021. She completed radiation on 04/04/22. She will be having right breast reconstruction on 06/24/2023.  PAIN:  Are you having pain? No, just tightness, 3-4/10 in Rt UT  PRECAUTIONS: Right lymphedema risk  WEIGHT BEARING RESTRICTIONS: No  FALLS:  Has patient fallen in last 6 months? No  LIVING ENVIRONMENT: Lives with: husband and 2 children Lives in: House/apartment Stairs: Yes; Internal: 14 steps; can reach both and External: 0 steps; none   OCCUPATION: Administrative Asst, works from home  LEISURE: walking, work out with trainer,  HAND DOMINANCE: right   PRIOR LEVEL OF FUNCTION: Independent  PATIENT GOALS: Get my mobility back, decrease pain   OBJECTIVE:  COGNITION: Overall cognitive status: Within functional limits for tasks assessed   PALPATION: Very tender right UT, supraspinatus  OBSERVATIONS / OTHER ASSESSMENTS: Right UT compensation, decreased right scapular mobility with shoulder ROM  SENSATION: Light touch: Appears intact    POSTURE: forward head, rounded shoulders, trunk  rotation  UPPER EXTREMITY AROM/PROM:  A/PROM RIGHT   eval   Shoulder extension 25 (pain ant. shoulder  Shoulder flexion 132, UT compensation  Shoulder abduction 123, tight UT/arm  Shoulder internal rotation   Shoulder external rotation     (Blank rows = not tested)  A/PROM LEFT  eval  Shoulder extension 41  Shoulder flexion 153  Shoulder abduction 178  Shoulder internal rotation 72  Shoulder external rotation 95    (Blank rows = not tested)  CERVICAL AROM:WNL except tightness noted in bilateral UT  UPPER EXTREMITY STRENGTH:   LYMPHEDEMA ASSESSMENTS:   SURGERY TYPE/DATE: 07/25/2021 Right Mastectomy with SLNB and tissue expander, Tissue expander removed 12/04/21  NUMBER OF LYMPH NODES REMOVED: 4/7  CHEMOTHERAPY: YES, 08/25/21-01/19/22  RADIATION:YES, 02/26/22-04/04/2022  HORMONE TREATMENT: YES  INFECTIONS: Yes with need for tissue expander to be removed   LYMPHEDEMA ASSESSMENTS:   LANDMARK RIGHT  eval  At axilla  32.4  15 cm proximal to olecranon process   10 cm proximal to olecranon process 25.6  Olecranon process 24.7  15 cm proximal to ulnar styloid process   10 cm proximal to ulnar styloid process 21.0  Just proximal to ulnar styloid process 15.3  Across hand at thumb web space 19.1  At base of 2nd digit 5.8  (Blank rows = not tested)  LANDMARK LEFT  eval  At axilla  29  15 cm proximal to olecranon process   10 cm proximal to olecranon process 24.2  Olecranon process 24.3  15 cm proximal to ulnar styloid process   10 cm proximal to ulnar styloid process 20.3  Just proximal to ulnar styloid process 14.6  Across hand at thumb web space 18.8  At base of 2nd digit 5.9  (Blank rows = not tested)    GAIT: WNL  L-DEX LYMPHEDEMA SCREENING: The patient was assessed using the L-Dex machine today to produce a lymphedema index baseline score. The patient will be reassessed on a regular basis (typically every 3 months) to obtain new L-Dex scores. If the  score is > 6.5 points away from his/her baseline score indicating onset of subclinical lymphedema, it will be recommended to wear a compression garment for 4 weeks, 12 hours per day and then be reassessed. If the score continues to be > 6.5 points from baseline at reassessment, we will initiate lymphedema treatment. Assessing in this manner has a 95% rate of preventing clinically significant lymphedema. 04/30/2023 SCORE TODAY -1.7  QUICK DASH SURVEY: 61%   TODAY'S TREATMENT:                                                                                                                                          DATE:  05/23/23: Manual therapy P/ROM in supine to Rt shoulder intermittently during STM into flexion, abd and D2 with scapular depression by therapist throughout, pt still tight and demonstrates muscle guarding which limits her end motions, but more now just abd as her flex is much improved, and multiple VC's to relax during P/ROM STM with cocoa butter in Lt S/L to Rt medial scapular border and UT, anterior and posterior shoulder and pectoralis insertion MFR to Rt medial upper arm where pt feels cording  like pull, but was unable to palpate such  05/21/23: Cervical A/ROM bil Rot x 10  Rt shoulder flexion A/ROM x 5 Trigger Point Dry-Needling  Treatment instructions: Expect mild to moderate muscle soreness. S/S of pneumothorax if dry needled over a lung field, and to seek immediate medical attention should they occur. Patient verbalized understanding of these instructions and education.  Patient Consent Given: Yes Education handout provided: Previously provided Muscles treated: Rt upper trap, Rt infraspinatus, Rt lat, Rt posterior delt and teres minor/major Electrical stimulation performed: No Parameters: N/A Treatment response/outcome: signif twitch and release Rt upper trap and lat, improved neck ROM  Demo'd levator scapula stretch with overpressure to add to HEP - unable to add due to  out of time - add next time  05/16/23: Manual therapy STM to Rt chest wall and lateral trunk, UT, and shoulder where pt palpably tight, moderately tender to touch at UT where a few trigger points palpable today than at last session; used cocoa butter and pt was in varying positions during session from supine, to Lt S/L then finished in supine; also worked on [employer] and cervical paraspinals and UT with cervical P/ROM throughout session P/ROM in supine of Rt shoulder intermittently during STM into flexion, abd and D2 with scapular depression by therapist throughout, pt was very tight due to muscle guarding which limits her end motions and multiple VC's to relax due to muscle guarding; also cervical Lt side bend and rotation with trigger point release to Rt UT and SCM  05/09/23: Trigger Point Dry-Needling  Treatment instructions: Expect mild to moderate muscle soreness. S/S of pneumothorax if dry needled over a lung field, and to seek immediate medical attention should they occur. Patient verbalized understanding of these instructions and education.  Patient Consent Given: Yes Education handout provided: Yes Muscles treated: Right upper trapezius and right pectoralis major in prone and supine Electrical stimulation performed: No Parameters: N/A Treatment response/outcome: Patient reported mild discomfort "It kind of feels like a massage" during dry needling. Very slight bleeding at one spot on her upper trap which immediately resolved. Deep ache reported. Bethann Punches, [place] 05/23/23 10:17 AM Manual therapy (continued by PTA) STM with cocoa butter to Rt chest wall and lateral trunk, UT, and shoulder where pt palpably tight, mildly tender to touch at UT where less trigger points continue to feel improved; pt was in varying positions during session from supine, to Lt S/L then finished in supine; also worked on Rt SCM and cervical paraspinals and UT with cervical P/ROM throughout session MFR to Rt  chest wall before use of cocoa butter P/ROM in supine of Rt shoulder intermittently during STM into flexion, abd and D2 with scapular depression by therapist throughout, pt was very tight due to muscle guarding which limits her end motions and multiple VC's to relax due to muscle guarding; also cervical Lt side bend with trigger point release to Rt UT and SCM    PATIENT EDUCATION:  Education details: Discussed POC, DN mobilizations, STM, PROM, strength, decrease UT compensation Person educated: Patient Education method: Explanation Education comprehension: verbalized understanding  HOME EXERCISE PROGRAM: None given  ASSESSMENT:  CLINICAL IMPRESSION: Pt returns having her second DN appt Tuesday and reports feeling much improved since then with decreased cervical muscle tightness and improved cervical A/ROM demonstrating full cervical rotation today. Continued with manual therapy of Rt upper quadrant. Progress noted with decreased palpable trigger points at medial scapular border and Rt UT. Still some trigger points palpable at  Rt UT but these are acute now as before they felt latent. Pt also reports her Rt UE motion feeling improved in general.    OBJECTIVE IMPAIRMENTS: decreased knowledge of condition, decreased ROM, decreased strength, increased fascial restrictions, impaired flexibility, impaired UE functional use, postural dysfunction, and pain.   ACTIVITY LIMITATIONS: lifting, sleeping, and reach over head  PARTICIPATION LIMITATIONS:  [place] dancing, working out  PERSONAL FACTORS: 1-2 comorbidities: Right breast cancer s/p chemotherapy and radiation  are also affecting patient's functional outcome.   REHAB POTENTIAL: Good  CLINICAL DECISION MAKING: Stable/uncomplicated  EVALUATION COMPLEXITY: Low  GOALS: Goals reviewed with patient? Yes  SHORT TERM GOALS: Target date: 05/28/2023  Independent with HEP for Right shoulder ROM, strength/stabilization Baseline: Goal status:  INITIAL  2.  Decreased pain in Right UT/pecs/ medial arm by 25 % Baseline:  Goal status: INITIAL  3.  Shoulder flexion and abd improved by 10-12 degrees for improved reaching Baseline:  Goal status: INITIAL Improved shoulder flexion and abd by 15-20 degrees  4.  Improved right scapulo -humeral rhythm and with decreased UT compensation Baseline:  Goal status: INITIAL   LONG TERM GOALS: Target date: 06/25/23  Right UT/pec/arm pain improved by atleast 50%  Baseline:  Goal status: INITIAL  2.  Quick dash no greater than 20%for improved function Baseline:  Goal status: INITIAL  3.   Able to perform [place] dancing with correct arm position without increased pain/compensation Baseline:  Goal status: INITIAL   4.    Improved right scapulo -humeral rhythm and with decreased UT compensation Baseline:  Goal status: INITIAL   5.  Pt will have improved ability to sleep on the right side without increased pain. Baseline:  Goal status: INITIAL  PLAN:  PT FREQUENCY: 2x/week  PT DURATION: 8 weeks  PLANNED INTERVENTIONS: Therapeutic exercises, Therapeutic activity, Neuromuscular re-education, Patient/Family education, Self Care, Joint mobilization, Dry Needling, Manual lymph drainage, scar mobilization, Ionotophoresis 4mg /ml Dexamethasone, Manual therapy, and Re-evaluation  PLAN FOR NEXT SESSION: Cont UT/levator stretches, cont DN to UT/pecs and other areas prn, STM prn, scapular and GH mobs, PROM, lower trap strengthening to decrease compensation, functional training  Berna Spare, PTA 05/23/23 10:17 AM

## 2023-05-27 ENCOUNTER — Inpatient Hospital Stay: Payer: 59

## 2023-05-28 ENCOUNTER — Ambulatory Visit: Payer: 59 | Admitting: Physical Therapy

## 2023-05-28 ENCOUNTER — Encounter: Payer: Self-pay | Admitting: Physical Therapy

## 2023-05-28 DIAGNOSIS — C50811 Malignant neoplasm of overlapping sites of right female breast: Secondary | ICD-10-CM | POA: Diagnosis not present

## 2023-05-28 DIAGNOSIS — R293 Abnormal posture: Secondary | ICD-10-CM

## 2023-05-28 DIAGNOSIS — M25611 Stiffness of right shoulder, not elsewhere classified: Secondary | ICD-10-CM

## 2023-05-28 NOTE — Therapy (Signed)
OUTPATIENT PHYSICAL THERAPY  UPPER EXTREMITY ONCOLOGY TREATMENT  Patient Name: Allison Reed MRN: [id number] DOB:07-31-1976, 47 y.o., female Today's Date: 05/28/2023  END OF SESSION:  PT End of Session - 05/28/23 0756     Visit Number 8    Number of Visits 16    Date for PT Re-Evaluation 06/25/23    PT Start Time 0800    PT Stop Time 0845    PT Time Calculation (min) 45 min    Activity Tolerance Patient tolerated treatment well    Behavior During Therapy [facility] [address] for tasks assessed/performed               Past Medical History:  Diagnosis Date   Diabetes mellitus without complication (HCC)    Type 2   Family history of brain tumor    Family history of skin cancer    Gestational diabetes    Headache    otc med prn   Heartburn in pregnancy    Incompetent cervix in pregnancy 04/2015   Missed abortion    no surgery required   Personal history of chemotherapy    Personal history of radiation therapy    Postpartum care following cesarean delivery (11/2) 10/12/2015   right breast ca 06/2021   UTI in pregnancy 03/2015   recent UTI, treatment completed   Past Surgical History:  Procedure Laterality Date   APPENDECTOMY  12/10/2004   BREAST BIOPSY Right 2022   BREAST BIOPSY Left 2022   BREAST RECONSTRUCTION WITH PLACEMENT OF TISSUE EXPANDER AND ALLODERM Right 07/25/2021   Procedure: RIGHT BREAST RECONSTRUCTION WITH PLACEMENT OF TISSUE EXPANDER AND ALLODERM;  Surgeon: Glenna Fellows, MD;  Location: [facility];  Service: Plastics;  Laterality: Right;   CERCLAGE LAPAROSCOPIC ABDOMINAL N/A 04/15/2015   Procedure: LAPAROSCOPIC TRANSABDOMINAL CERVCOISTHMIC CERCLAGE;  Surgeon: Fermin Schwab, MD;  Location: [facility];  Service: Gynecology;  Laterality: N/A;   CERVICAL CERCLAGE  02/08/2011   vaginal   CESAREAN SECTION N/A 10/12/2015   Procedure: CESAREAN SECTION;  Surgeon: Genia Del, MD;  Location: [facility];  Service: Obstetrics;  Laterality: N/A;   CESAREAN  SECTION N/A 03/03/2018   Procedure: Repeat CESAREAN SECTION/Removal of Abdominal Cerclage;  Surgeon: Shea Evans, MD;  Location: [facility];  Service: Obstetrics;  Laterality: N/A;  EDD: 03/28/18   MASTECTOMY Right 2022   MASTECTOMY W/ SENTINEL NODE BIOPSY Right 07/25/2021   Procedure: RIGHT MASTECTOMY WITH AXILLARY SENTINEL LYMPH NODE BIOPSY;  Surgeon: Emelia Loron, MD;  Location: [facility];  Service: General;  Laterality: Right;   PORTACATH PLACEMENT N/A 08/24/2021   Procedure: INSERTION PORT-A-CATH;  Surgeon: Emelia Loron, MD;  Location: [facility];  Service: General;  Laterality: N/A;   RADIOACTIVE SEED GUIDED AXILLARY SENTINEL LYMPH NODE Right 07/25/2021   Procedure: RADIOACTIVE SEED GUIDED RIGHT AXILLARY NODE EXCISION;  Surgeon: Emelia Loron, MD;  Location:  SURGERY CENTER;  Service: General;  Laterality: Right;   TISSUE EXPANDER PLACEMENT Right 12/04/2021   Procedure: REMOVAL RIGHT CHEST TISSUE EXPANDER;  Surgeon: Glenna Fellows, MD;  Location: [facility];  Service: Plastics;  Laterality: Right;   WISDOM TOOTH EXTRACTION  12/11/1999   Patient Active Problem List   Diagnosis Date Noted   Lower leg mass, left 05/02/2022   Port-A-Cath in place 08/25/2021   S/P mastectomy, right 07/25/2021   Genetic testing 07/01/2021   Family history of skin cancer 06/22/2021   Family history of brain tumor 06/22/2021   Malignant neoplasm of overlapping sites of right breast  in female, estrogen receptor positive (HCC) 06/21/2021   Status post repeat low transverse cesarean section 03/03/2018   DM (diabetes mellitus) in pregnancy 03/02/2018   Incompetent cervix in pregnancy 03/02/2018    PCP: Dr. Adrian Prince, MD  REFERRING PROVIDER: [name], MD  REFERRING DIAG: S/p Right Mastectomy  THERAPY DIAG:  Stiffness of right shoulder, not elsewhere classified  Abnormal posture  ONSET DATE: April 22,2024  Rationale for  Evaluation and Treatment: Rehabilitation  SUBJECTIVE:                                                                                                                                                                                           SUBJECTIVE STATEMENT:  The DN released my neck but not my shoulder.  My shoulder joint hurts.  It's chronic and not severe.  PERTINENT HISTORY:  Pt is s/p Right Mastectomy with Tissue Expander and 4+/7 LN on 07/25/21. She is s/p 6 months of chemo followed by radiation. She had emergency surgery to remove tissue expander due to infection on 12/04/2021. She completed radiation on 04/04/22. She will be having right breast reconstruction on 06/24/2023.  PAIN:  Are you having pain? Yes, chronic  3-4/10 in Rt shoulder  PRECAUTIONS: Right lymphedema risk  WEIGHT BEARING RESTRICTIONS: No  FALLS:  Has patient fallen in last 6 months? No  LIVING ENVIRONMENT: Lives with: husband and 2 children Lives in: House/apartment Stairs: Yes; Internal: 14 steps; can reach both and External: 0 steps; none   OCCUPATION: Administrative Asst, works from home  LEISURE: walking, work out with trainer,  HAND DOMINANCE: right   PRIOR LEVEL OF FUNCTION: Independent  PATIENT GOALS: Get my mobility back, decrease pain   OBJECTIVE:  COGNITION: Overall cognitive status: Within functional limits for tasks assessed   PALPATION: Very tender right UT, supraspinatus  OBSERVATIONS / OTHER ASSESSMENTS: Right UT compensation, decreased right scapular mobility with shoulder ROM  SENSATION: Light touch: Appears intact    POSTURE: forward head, rounded shoulders, trunk rotation  UPPER EXTREMITY AROM/PROM:  A/PROM RIGHT   eval   Shoulder extension 25 (pain ant. shoulder  Shoulder flexion 132, UT compensation  Shoulder abduction 123, tight UT/arm  Shoulder internal rotation   Shoulder external rotation     (Blank rows = not tested)  A/PROM LEFT   eval  Shoulder  extension 41  Shoulder flexion 153  Shoulder abduction 178  Shoulder internal rotation 72  Shoulder external rotation 95    (Blank rows = not tested)  CERVICAL AROM:WNL except tightness noted in bilateral UT  UPPER EXTREMITY STRENGTH:   LYMPHEDEMA ASSESSMENTS:   SURGERY TYPE/DATE: 07/25/2021 Right  Mastectomy with SLNB and tissue expander, Tissue expander removed 12/04/21  NUMBER OF LYMPH NODES REMOVED: 4/7  CHEMOTHERAPY: YES, 08/25/21-01/19/22  RADIATION:YES, 02/26/22-04/04/2022  HORMONE TREATMENT: YES  INFECTIONS: Yes with need for tissue expander to be removed   LYMPHEDEMA ASSESSMENTS:   LANDMARK RIGHT  eval  At axilla  32.4  15 cm proximal to olecranon process   10 cm proximal to olecranon process 25.6  Olecranon process 24.7  15 cm proximal to ulnar styloid process   10 cm proximal to ulnar styloid process 21.0  Just proximal to ulnar styloid process 15.3  Across hand at thumb web space 19.1  At base of 2nd digit 5.8  (Blank rows = not tested)  LANDMARK LEFT  eval  At axilla  29  15 cm proximal to olecranon process   10 cm proximal to olecranon process 24.2  Olecranon process 24.3  15 cm proximal to ulnar styloid process   10 cm proximal to ulnar styloid process 20.3  Just proximal to ulnar styloid process 14.6  Across hand at thumb web space 18.8  At base of 2nd digit 5.9  (Blank rows = not tested)    GAIT: WNL  L-DEX LYMPHEDEMA SCREENING: The patient was assessed using the L-Dex machine today to produce a lymphedema index baseline score. The patient will be reassessed on a regular basis (typically every 3 months) to obtain new L-Dex scores. If the score is > 6.5 points away from his/her baseline score indicating onset of subclinical lymphedema, it will be recommended to wear a compression garment for 4 weeks, 12 hours per day and then be reassessed. If the score continues to be > 6.5 points from baseline at reassessment, we will initiate lymphedema  treatment. Assessing in this manner has a 95% rate of preventing clinically significant lymphedema. 04/30/2023 SCORE TODAY -1.7  QUICK DASH SURVEY: 61%   TODAY'S TREATMENT:                                                                                                                                          DATE:  05/28/23: NuStep L5 x 3' PT present to discuss status and plan for session Seated levator and upper trap stretch with overpressure bil 1x20" each Rt wall slide with fwd weight shift - initial reps PT facilitating scapular mechanics Standing red tband: Rt shoulder IR, ER x10 each, standing lower trap W x 10, lower trap setting lift offs from wall in overhead V x10 bil Supine joint mobs GH joint on Rt Gr III/IV at end range for abduction/IR Supine and prone DN to Rt lat, prone DN to Rt upper trap - signif twitch in upper trap HEP advanced, given red tband and handouts  05/23/23: Manual therapy P/ROM in supine to Rt shoulder intermittently during STM into flexion, abd and D2 with scapular depression by therapist throughout, pt still tight and demonstrates muscle guarding which  limits her end motions, but more now just abd as her flex is much improved, and multiple VC's to relax during P/ROM STM with cocoa butter in Lt S/L to Rt medial scapular border and UT, anterior and posterior shoulder and pectoralis insertion MFR to Rt medial upper arm where pt feels cording like pull, but was unable to palpate such  05/21/23: Cervical A/ROM bil Rot x 10  Rt shoulder flexion A/ROM x 5 Trigger Point Dry-Needling  Treatment instructions: Expect mild to moderate muscle soreness. S/S of pneumothorax if dry needled over a lung field, and to seek immediate medical attention should they occur. Patient verbalized understanding of these instructions and education.  Patient Consent Given: Yes Education handout provided: Previously provided Muscles treated: Rt upper trap, Rt infraspinatus, Rt lat, Rt  posterior delt and teres minor/major Electrical stimulation performed: No Parameters: N/A Treatment response/outcome: signif twitch and release Rt upper trap and lat, improved neck ROM  Demo'd levator scapula stretch with overpressure to add to HEP - unable to add due to out of time - add next time     PATIENT EDUCATION:  Education details: [email or web address], Discussed POC, DN mobilizations, STM, PROM, strength, decrease UT compensation Person educated: Patient Education method: Explanation Education comprehension: verbalized understanding  HOME EXERCISE PROGRAM: Access Code: [email or web address] URL: [email or web address] Date: 05/28/2023 Prepared by: [name] Babak Lucus  Exercises - Seated Levator Scapulae Stretch  - 1 x daily - 7 x weekly - 1 sets - 2 reps - 20 hold - Seated Upper Trapezius Stretch  - 1 x daily - 7 x weekly - 1 sets - 2 reps - 20 hold - Shoulder Lat Pull Down with Resistance  - 1 x daily - 7 x weekly - 2 sets - 10 reps - Low Trap Setting at Wall  - 1 x daily - 7 x weekly - 3 sets - 5 reps - Shoulder External Rotation with Anchored Resistance  - 1 x daily - 7 x weekly - 2 sets - 10 reps - Standing Shoulder Internal Rotation with Anchored Resistance  - 1 x daily - 7 x weekly - 2 sets - 10 reps  ASSESSMENT:  CLINICAL IMPRESSION: Pt repots ongoing pain and stiffness in Rt GH joint but neck has fully released with DN.  She did have a small TP in Rt upper trap today.  GH mobs at end available range are well tolerated today.  PT advanced HEP to include neck stretches with overpressure, lower trap setting and strength, and RC stabilization using red tband.  Pt going on vacation to beach and will return next week.   OBJECTIVE IMPAIRMENTS: decreased knowledge of condition, decreased ROM, decreased strength, increased fascial restrictions, impaired flexibility, impaired UE functional use, postural dysfunction, and pain.   ACTIVITY LIMITATIONS: lifting, sleeping, and reach over  head  PARTICIPATION LIMITATIONS:  [place] dancing, working out  PERSONAL FACTORS: 1-2 comorbidities: Right breast cancer s/p chemotherapy and radiation  are also affecting patient's functional outcome.   REHAB POTENTIAL: Good  CLINICAL DECISION MAKING: Stable/uncomplicated  EVALUATION COMPLEXITY: Low  GOALS: Goals reviewed with patient? Yes  SHORT TERM GOALS: Target date: 05/28/2023  Independent with HEP for Right shoulder ROM, strength/stabilization Baseline: Goal status: INITIAL  2.  Decreased pain in Right UT/pecs/ medial arm by 25 % Baseline:  Goal status: INITIAL  3.  Shoulder flexion and abd improved by 10-12 degrees for improved reaching Baseline:  Goal status: MET Improved shoulder flexion and abd by 15-20 degrees  4.  Improved right scapulo -humeral  rhythm and with decreased UT compensation Baseline:  Goal status: INITIAL   LONG TERM GOALS: Target date: 06/25/23  Right UT/pec/arm pain improved by atleast 50%  Baseline:  Goal status: INITIAL  2.  Quick dash no greater than 20%for improved function Baseline:  Goal status: INITIAL  3.   Able to perform [place] dancing with correct arm position without increased pain/compensation Baseline:  Goal status: INITIAL   4.    Improved right scapulo -humeral rhythm and with decreased UT compensation Baseline:  Goal status: INITIAL   5.  Pt will have improved ability to sleep on the right side without increased pain. Baseline:  Goal status: INITIAL  PLAN:  PT FREQUENCY: 2x/week  PT DURATION: 8 weeks  PLANNED INTERVENTIONS: Therapeutic exercises, Therapeutic activity, Neuromuscular re-education, Patient/Family education, Self Care, Joint mobilization, Dry Needling, Manual lymph drainage, scar mobilization, Ionotophoresis 4mg /ml Dexamethasone, Manual therapy, and Re-evaluation  PLAN FOR NEXT SESSION: review HEP progression, progress scapular training and mechanics as tol, Cont UT/levator stretches, cont DN to  UT/pecs and other areas prn, STM prn, scapular and GH mobs, PROM, lower trap strengthening to decrease compensation, functional training  Randall Colden, PT 05/28/23 11:49 AM

## 2023-06-04 ENCOUNTER — Encounter: Payer: Self-pay | Admitting: Physical Therapy

## 2023-06-04 ENCOUNTER — Ambulatory Visit: Payer: 59 | Admitting: Physical Therapy

## 2023-06-04 ENCOUNTER — Other Ambulatory Visit: Payer: Self-pay

## 2023-06-04 ENCOUNTER — Inpatient Hospital Stay: Payer: 59 | Attending: Hematology and Oncology

## 2023-06-04 VITALS — BP 113/82 | HR 98 | Temp 98.3°F | Resp 18

## 2023-06-04 DIAGNOSIS — C50811 Malignant neoplasm of overlapping sites of right female breast: Secondary | ICD-10-CM | POA: Diagnosis present

## 2023-06-04 DIAGNOSIS — Z79818 Long term (current) use of other agents affecting estrogen receptors and estrogen levels: Secondary | ICD-10-CM | POA: Diagnosis present

## 2023-06-04 DIAGNOSIS — R293 Abnormal posture: Secondary | ICD-10-CM

## 2023-06-04 DIAGNOSIS — Z170 Estrogen receptor positive status [ER+]: Secondary | ICD-10-CM

## 2023-06-04 DIAGNOSIS — M25611 Stiffness of right shoulder, not elsewhere classified: Secondary | ICD-10-CM

## 2023-06-04 MED ORDER — GOSERELIN ACETATE 3.6 MG ~~LOC~~ IMPL
3.6000 mg | DRUG_IMPLANT | SUBCUTANEOUS | Status: DC
  Administered 2023-06-04: 3.6 mg via SUBCUTANEOUS
  Filled 2023-06-04: qty 3.6

## 2023-06-04 NOTE — Therapy (Signed)
OUTPATIENT PHYSICAL THERAPY  UPPER EXTREMITY ONCOLOGY TREATMENT  Patient Name: CANDEE HOON MRN: [id number] DOB:[birth year], 47 y.o., female Today's Date: 06/04/2023  END OF SESSION:  PT End of Session - 06/04/23 0804     Visit Number 9    Number of Visits 16    Date for PT Re-Evaluation 06/25/23    PT Start Time 0803    PT Stop Time 0847    PT Time Calculation (min) 44 min    Activity Tolerance Patient tolerated treatment well    Behavior During Therapy [facility] for tasks assessed/performed                Past Medical History:  Diagnosis Date   Diabetes mellitus without complication (HCC)    Type 2   Family history of brain tumor    Family history of skin cancer    Gestational diabetes    Headache    otc med prn   Heartburn in pregnancy    Incompetent cervix in pregnancy 04/2015   Missed abortion    no surgery required   Personal history of chemotherapy    Personal history of radiation therapy    Postpartum care following cesarean delivery (11/2) 10/12/2015   right breast ca 06/2021   UTI in pregnancy 03/2015   recent UTI, treatment completed   Past Surgical History:  Procedure Laterality Date   APPENDECTOMY  12/10/2004   BREAST BIOPSY Right 2022   BREAST BIOPSY Left 2022   BREAST RECONSTRUCTION WITH PLACEMENT OF TISSUE EXPANDER AND ALLODERM Right 07/25/2021   Procedure: RIGHT BREAST RECONSTRUCTION WITH PLACEMENT OF TISSUE EXPANDER AND ALLODERM;  Surgeon: Glenna Fellows, MD;  Location: [facility];  Service: Plastics;  Laterality: Right;   CERCLAGE LAPAROSCOPIC ABDOMINAL N/A 04/15/2015   Procedure: LAPAROSCOPIC TRANSABDOMINAL CERVCOISTHMIC CERCLAGE;  Surgeon: Fermin Schwab, MD;  Location: [facility];  Service: Gynecology;  Laterality: N/A;   CERVICAL CERCLAGE  02/08/2011   vaginal   CESAREAN SECTION N/A 10/12/2015   Procedure: CESAREAN SECTION;  Surgeon: Genia Del, MD;  Location: [facility];  Service: Obstetrics;  Laterality: N/A;    CESAREAN SECTION N/A 03/03/2018   Procedure: Repeat CESAREAN SECTION/Removal of Abdominal Cerclage;  Surgeon: Shea Evans, MD;  Location: [facility];  Service: Obstetrics;  Laterality: N/A;  EDD: 03/28/18   MASTECTOMY Right 2022   MASTECTOMY W/ SENTINEL NODE BIOPSY Right 07/25/2021   Procedure: RIGHT MASTECTOMY WITH AXILLARY SENTINEL LYMPH NODE BIOPSY;  Surgeon: Emelia Loron, MD;  Location: [facility];  Service: General;  Laterality: Right;   PORTACATH PLACEMENT N/A 08/24/2021   Procedure: INSERTION PORT-A-CATH;  Surgeon: Emelia Loron, MD;  Location: [facility];  Service: General;  Laterality: N/A;   RADIOACTIVE SEED GUIDED AXILLARY SENTINEL LYMPH NODE Right 07/25/2021   Procedure: RADIOACTIVE SEED GUIDED RIGHT AXILLARY NODE EXCISION;  Surgeon: Emelia Loron, MD;  Location: [facility];  Service: General;  Laterality: Right;   TISSUE EXPANDER PLACEMENT Right 12/04/2021   Procedure: REMOVAL RIGHT CHEST TISSUE EXPANDER;  Surgeon: Glenna Fellows, MD;  Location: [facility];  Service: Plastics;  Laterality: Right;   WISDOM TOOTH EXTRACTION  12/11/1999   Patient Active Problem List   Diagnosis Date Noted   Lower leg mass, left 05/02/2022   Port-A-Cath in place 08/25/2021   S/P mastectomy, right 07/25/2021   Genetic testing 07/01/2021   Family history of skin cancer 06/22/2021   Family history of brain tumor 06/22/2021   Malignant neoplasm of overlapping sites of right  breast in female, estrogen receptor positive (HCC) 06/21/2021   Status post repeat low transverse cesarean section 03/03/2018   DM (diabetes mellitus) in pregnancy 03/02/2018   Incompetent cervix in pregnancy 03/02/2018    PCP: Dr. Adrian Prince, MD  REFERRING PROVIDER: [name], MD  REFERRING DIAG: S/p Right Mastectomy  THERAPY DIAG:  Stiffness of right shoulder, not elsewhere classified  Abnormal posture  ONSET DATE: April 22,2024  Rationale  for Evaluation and Treatment: Rehabilitation  SUBJECTIVE:                                                                                                                                                                                           SUBJECTIVE STATEMENT:  I am feeling pessimistic b/c my shoulder joint hurts and I always feel myself using my upper trap.  I am losing strength in my shoulder joint.  My upper trap and pecs probably still need more DN.  PERTINENT HISTORY:  Pt is s/p Right Mastectomy with Tissue Expander and 4+/7 LN on 07/25/21. She is s/p 6 months of chemo followed by radiation. She had emergency surgery to remove tissue expander due to infection on 12/04/2021. She completed radiation on 04/04/22. She will be having right breast reconstruction on 06/24/2023.  PAIN:  Are you having pain? Yes, chronic  4/10 in Rt shoulder  PRECAUTIONS: Right lymphedema risk  WEIGHT BEARING RESTRICTIONS: No  FALLS:  Has patient fallen in last 6 months? No  LIVING ENVIRONMENT: Lives with: husband and 2 children Lives in: House/apartment Stairs: Yes; Internal: 14 steps; can reach both and External: 0 steps; none   OCCUPATION: Administrative Asst, works from home  LEISURE: walking, work out with trainer,  HAND DOMINANCE: right   PRIOR LEVEL OF FUNCTION: Independent  PATIENT GOALS: Get my mobility back, decrease pain   OBJECTIVE:  COGNITION: Overall cognitive status: Within functional limits for tasks assessed   PALPATION: Very tender right UT, supraspinatus  OBSERVATIONS / OTHER ASSESSMENTS: Right UT compensation, decreased right scapular mobility with shoulder ROM  SENSATION: Light touch: Appears intact    POSTURE: forward head, rounded shoulders, trunk rotation  UPPER EXTREMITY AROM/PROM:  A/PROM RIGHT   eval  RIGHT 06/04/23  Shoulder extension 25 (pain ant. shoulder 32  Shoulder flexion 132, UT compensation 125 improved mechanics  Shoulder abduction 123,  tight UT/arm   Shoulder internal rotation    Shoulder external rotation      (Blank rows = not tested)  A/PROM LEFT   eval  Shoulder extension 41  Shoulder flexion 153  Shoulder abduction 178  Shoulder internal rotation 72  Shoulder external rotation 95    (  Blank rows = not tested)  CERVICAL AROM:WNL except tightness noted in bilateral UT  UPPER EXTREMITY STRENGTH:   LYMPHEDEMA ASSESSMENTS:   SURGERY TYPE/DATE: 07/25/2021 Right Mastectomy with SLNB and tissue expander, Tissue expander removed 12/04/21  NUMBER OF LYMPH NODES REMOVED: 4/7  CHEMOTHERAPY: YES, 08/25/21-01/19/22  RADIATION:YES, 02/26/22-04/04/2022  HORMONE TREATMENT: YES  INFECTIONS: Yes with need for tissue expander to be removed   LYMPHEDEMA ASSESSMENTS:   LANDMARK RIGHT  eval  At axilla  32.4  15 cm proximal to olecranon process   10 cm proximal to olecranon process 25.6  Olecranon process 24.7  15 cm proximal to ulnar styloid process   10 cm proximal to ulnar styloid process 21.0  Just proximal to ulnar styloid process 15.3  Across hand at thumb web space 19.1  At base of 2nd digit 5.8  (Blank rows = not tested)  LANDMARK LEFT  eval  At axilla  29  15 cm proximal to olecranon process   10 cm proximal to olecranon process 24.2  Olecranon process 24.3  15 cm proximal to ulnar styloid process   10 cm proximal to ulnar styloid process 20.3  Just proximal to ulnar styloid process 14.6  Across hand at thumb web space 18.8  At base of 2nd digit 5.9  (Blank rows = not tested)    GAIT: WNL  L-DEX LYMPHEDEMA SCREENING: The patient was assessed using the L-Dex machine today to produce a lymphedema index baseline score. The patient will be reassessed on a regular basis (typically every 3 months) to obtain new L-Dex scores. If the score is > 6.5 points away from his/her baseline score indicating onset of subclinical lymphedema, it will be recommended to wear a compression garment for 4 weeks, 12 hours  per day and then be reassessed. If the score continues to be > 6.5 points from baseline at reassessment, we will initiate lymphedema treatment. Assessing in this manner has a 95% rate of preventing clinically significant lymphedema. 04/30/2023 SCORE TODAY -1.7  QUICK DASH SURVEY: 61%   TODAY'S TREATMENT:                                                                                                                                          DATE:  06/04/23: NuStep L5 x 3' PT present to discuss status Supine AA/ROM bil shoulder flexion using cane + 4lb ankle weight for stretch 8x5" holds ("this feels so good!") Prone Rt shoulder extension, row, horiz abd, Y (lower trap lift) x10 each, PT providing manual facil of scapular mechanics Lt SL Rt scapular clocks with manual facilitation and then manual resistance Trigger Point Dry-Needling  Treatment instructions: Expect mild to moderate muscle soreness. S/S of pneumothorax if dry needled over a lung field, and to seek immediate medical attention should they occur. Patient verbalized understanding of these instructions and education.  Patient Consent Given: Yes Education handout provided: Yes Muscles  treated: Rt pecs and Rt upper trap Electrical stimulation performed: No Parameters: N/A Treatment response/outcome: signif twitch in pecs > Rt upper trap with release of tension Pt encouraged to get shoulder pulleys, focus on towel stretch (yes, into pain), supine dowel flexion with added weight, scapular clocks in Lt SL for HEP (no formal updates in chart due to time)   05/28/23: NuStep L5 x 3' PT present to discuss status and plan for session Seated levator and upper trap stretch with overpressure bil 1x20" each Rt wall slide with fwd weight shift - initial reps PT facilitating scapular mechanics Standing red tband: Rt shoulder IR, ER x10 each, standing lower trap W x 10, lower trap setting lift offs from wall in overhead V x10 bil Supine joint mobs  GH joint on Rt Gr III/IV at end range for abduction/IR Supine and prone DN to Rt lat, prone DN to Rt upper trap - signif twitch in upper trap HEP advanced, given red tband and handouts  05/23/23: Manual therapy P/ROM in supine to Rt shoulder intermittently during STM into flexion, abd and D2 with scapular depression by therapist throughout, pt still tight and demonstrates muscle guarding which limits her end motions, but more now just abd as her flex is much improved, and multiple VC's to relax during P/ROM STM with cocoa butter in Lt S/L to Rt medial scapular border and UT, anterior and posterior shoulder and pectoralis insertion MFR to Rt medial upper arm where pt feels cording like pull, but was unable to palpate such  05/21/23: Cervical A/ROM bil Rot x 10  Rt shoulder flexion A/ROM x 5 Trigger Point Dry-Needling  Treatment instructions: Expect mild to moderate muscle soreness. S/S of pneumothorax if dry needled over a lung field, and to seek immediate medical attention should they occur. Patient verbalized understanding of these instructions and education.  Patient Consent Given: Yes Education handout provided: Previously provided Muscles treated: Rt upper trap, Rt infraspinatus, Rt lat, Rt posterior delt and teres minor/major Electrical stimulation performed: No Parameters: N/A Treatment response/outcome: signif twitch and release Rt upper trap and lat, improved neck ROM  Demo'd levator scapula stretch with overpressure to add to HEP - unable to add due to out of time - add next time     PATIENT EDUCATION:  Education details: [email or web address], Discussed POC, DN mobilizations, STM, PROM, strength, decrease UT compensation Person educated: Patient Education method: Explanation Education comprehension: verbalized understanding  HOME EXERCISE PROGRAM: Access Code: [email or web address] URL: [email or web address] Date: 05/28/2023 Prepared by: [name] Obdulio Mash  Exercises - Seated  Levator Scapulae Stretch  - 1 x daily - 7 x weekly - 1 sets - 2 reps - 20 hold - Seated Upper Trapezius Stretch  - 1 x daily - 7 x weekly - 1 sets - 2 reps - 20 hold - Shoulder Lat Pull Down with Resistance  - 1 x daily - 7 x weekly - 2 sets - 10 reps - Low Trap Setting at Wall  - 1 x daily - 7 x weekly - 3 sets - 5 reps - Shoulder External Rotation with Anchored Resistance  - 1 x daily - 7 x weekly - 2 sets - 10 reps - Standing Shoulder Internal Rotation with Anchored Resistance  - 1 x daily - 7 x weekly - 2 sets - 10 reps  ASSESSMENT:  CLINICAL IMPRESSION: Pt continues to have much improved cervical ROM.  She has ongoing limitations in Rt GH joint ROM and pain affecting daily tasks, ability to do dances,  and sleep on Rt side. TPDN released pectoral TP today.  Rt upper trap TP has reduced in size.  Pt measurements of Rt GH joint reveal a stall in progress so PT encouraged Pt to get shoulder pulleys, return to use of towel for IR stretch, and use weighted dowel for overhead flexion stretch in supine.  Pt has pushed back her surgery that was previously scheduled for July 15 which will allow her to continue to address shoulder symptoms and function. PT added prone I/Y/T with manual facilitation of Rt scapula today and scapular clocks for improved middle and lower trap activation and training.   OBJECTIVE IMPAIRMENTS: decreased knowledge of condition, decreased ROM, decreased strength, increased fascial restrictions, impaired flexibility, impaired UE functional use, postural dysfunction, and pain.   ACTIVITY LIMITATIONS: lifting, sleeping, and reach over head  PARTICIPATION LIMITATIONS:  [place] dancing, working out  PERSONAL FACTORS: 1-2 comorbidities: Right breast cancer s/p chemotherapy and radiation  are also affecting patient's functional outcome.   REHAB POTENTIAL: Good  CLINICAL DECISION MAKING: Stable/uncomplicated  EVALUATION COMPLEXITY: Low  GOALS: Goals reviewed with patient?  Yes  SHORT TERM GOALS: Target date: 05/28/2023  Independent with HEP for Right shoulder ROM, strength/stabilization Baseline: Goal status:ONGOING  2.  Decreased pain in Right UT/pecs/ medial arm by 25 % Baseline:  Goal status: MET 6/25  3.  Shoulder flexion and abd improved by 10-12 degrees for improved reaching Baseline:  Goal status: MET Improved shoulder flexion and abd by 15-20 degrees  4.  Improved right scapulo -humeral rhythm and with decreased UT compensation Baseline:  Goal status: ONGOING   LONG TERM GOALS: Target date: 06/25/23  Right UT/pec/arm pain improved by atleast 50%  Baseline:  Goal status: INITIAL  2.  Quick dash no greater than 20%for improved function Baseline:  Goal status: INITIAL  3.   Able to perform [place] dancing with correct arm position without increased pain/compensation Baseline:  Goal status: INITIAL   4.    Improved right scapulo -humeral rhythm and with decreased UT compensation Baseline:  Goal status: INITIAL   5.  Pt will have improved ability to sleep on the right side without increased pain. Baseline:  Goal status: INITIAL  PLAN:  PT FREQUENCY: 2x/week  PT DURATION: 8 weeks  PLANNED INTERVENTIONS: Therapeutic exercises, Therapeutic activity, Neuromuscular re-education, Patient/Family education, Self Care, Joint mobilization, Dry Needling, Manual lymph drainage, scar mobilization, Ionotophoresis 4mg /ml Dexamethasone, Manual therapy, and Re-evaluation  PLAN FOR NEXT SESSION: review HEP progression, progress scapular training and mechanics as tol, Cont UT/levator stretches, cont DN to UT/pecs and other areas prn, STM prn, scapular and GH mobs, PROM, lower trap strengthening to decrease compensation, functional training  Charnetta Wulff, PT 06/04/23 1:26 PM

## 2023-06-05 ENCOUNTER — Ambulatory Visit: Payer: 59

## 2023-06-05 DIAGNOSIS — C50811 Malignant neoplasm of overlapping sites of right female breast: Secondary | ICD-10-CM

## 2023-06-05 DIAGNOSIS — R293 Abnormal posture: Secondary | ICD-10-CM

## 2023-06-05 DIAGNOSIS — Z483 Aftercare following surgery for neoplasm: Secondary | ICD-10-CM

## 2023-06-05 DIAGNOSIS — M25611 Stiffness of right shoulder, not elsewhere classified: Secondary | ICD-10-CM

## 2023-06-05 NOTE — Therapy (Signed)
OUTPATIENT PHYSICAL THERAPY  UPPER EXTREMITY ONCOLOGY TREATMENT  Patient Name: Allison Reed MRN: [id number] DOB:09/20/1976, 47 y.o., female Today's Date: 06/05/2023  END OF SESSION:  PT End of Session - 06/05/23 0817     Visit Number 10    Number of Visits 16    Date for PT Re-Evaluation 06/25/23    PT Start Time 0807    PT Stop Time 0902    PT Time Calculation (min) 55 min    Activity Tolerance Patient tolerated treatment well    Behavior During Therapy [facility] for tasks assessed/performed                Past Medical History:  Diagnosis Date   Diabetes mellitus without complication (HCC)    Type 2   Family history of brain tumor    Family history of skin cancer    Gestational diabetes    Headache    otc med prn   Heartburn in pregnancy    Incompetent cervix in pregnancy 04/2015   Missed abortion    no surgery required   Personal history of chemotherapy    Personal history of radiation therapy    Postpartum care following cesarean delivery (11/2) 10/12/2015   right breast ca 06/2021   UTI in pregnancy 03/2015   recent UTI, treatment completed   Past Surgical History:  Procedure Laterality Date   APPENDECTOMY  12/10/2004   BREAST BIOPSY Right 2022   BREAST BIOPSY Left 2022   BREAST RECONSTRUCTION WITH PLACEMENT OF TISSUE EXPANDER AND ALLODERM Right 07/25/2021   Procedure: RIGHT BREAST RECONSTRUCTION WITH PLACEMENT OF TISSUE EXPANDER AND ALLODERM;  Surgeon: Allison Fellows, MD;  Location: [facility];  Service: Plastics;  Laterality: Right;   CERCLAGE LAPAROSCOPIC ABDOMINAL N/A 04/15/2015   Procedure: LAPAROSCOPIC TRANSABDOMINAL CERVCOISTHMIC CERCLAGE;  Surgeon: Allison Schwab, MD;  Location: [facility];  Service: Gynecology;  Laterality: N/A;   CERVICAL CERCLAGE  02/08/2011   vaginal   CESAREAN SECTION N/A 10/12/2015   Procedure: CESAREAN SECTION;  Surgeon: Allison Del, MD;  Location: [facility];  Service: Obstetrics;  Laterality: N/A;    CESAREAN SECTION N/A 03/03/2018   Procedure: Repeat CESAREAN SECTION/Removal of Abdominal Cerclage;  Surgeon: Allison Evans, MD;  Location: [facility];  Service: Obstetrics;  Laterality: N/A;  EDD: 03/28/18   MASTECTOMY Right 2022   MASTECTOMY W/ SENTINEL NODE BIOPSY Right 07/25/2021   Procedure: RIGHT MASTECTOMY WITH AXILLARY SENTINEL LYMPH NODE BIOPSY;  Surgeon: Allison Loron, MD;  Location: [facility];  Service: General;  Laterality: Right;   PORTACATH PLACEMENT N/A 08/24/2021   Procedure: INSERTION PORT-A-CATH;  Surgeon: Allison Loron, MD;  Location: [facility];  Service: General;  Laterality: N/A;   RADIOACTIVE SEED GUIDED AXILLARY SENTINEL LYMPH NODE Right 07/25/2021   Procedure: RADIOACTIVE SEED GUIDED RIGHT AXILLARY NODE EXCISION;  Surgeon: Allison Loron, MD;  Location: [facility];  Service: General;  Laterality: Right;   TISSUE EXPANDER PLACEMENT Right 12/04/2021   Procedure: REMOVAL RIGHT CHEST TISSUE EXPANDER;  Surgeon: Allison Fellows, MD;  Location: [facility];  Service: Plastics;  Laterality: Right;   WISDOM TOOTH EXTRACTION  12/11/1999   Patient Active Problem List   Diagnosis Date Noted   Lower leg mass, left 05/02/2022   Port-A-Cath in place 08/25/2021   S/P mastectomy, right 07/25/2021   Genetic testing 07/01/2021   Family history of skin cancer 06/22/2021   Family history of brain tumor 06/22/2021   Malignant neoplasm of overlapping sites of right  breast in female, estrogen receptor positive (HCC) 06/21/2021   Status post repeat low transverse cesarean section 03/03/2018   DM (diabetes mellitus) in pregnancy 03/02/2018   Incompetent cervix in pregnancy 03/02/2018    PCP: Dr. Adrian Prince, MD  REFERRING PROVIDER: [name], MD  REFERRING DIAG: S/p Right Mastectomy  THERAPY DIAG:  Stiffness of right shoulder, not elsewhere classified  Abnormal posture  Malignant neoplasm of overlapping sites  of right breast in female, estrogen receptor positive [facility])  Aftercare following surgery for neoplasm  ONSET DATE: April 22,2024  Rationale for Evaluation and Treatment: Rehabilitation  SUBJECTIVE:                                                                                                                                                                                           SUBJECTIVE STATEMENT:  Allison Reed got some good muscle twitches yesterday. Overall my shoulder is much looser I just still have a chronic trigger point in my UT.   PERTINENT HISTORY:  Pt is s/p Right Mastectomy with Tissue Expander and 4+/7 LN on 07/25/21. She is s/p 6 months of chemo followed by radiation. She had emergency surgery to remove tissue expander due to infection on 12/04/2021. She completed radiation on 04/04/22. She will be having right breast reconstruction on 06/24/2023.  PAIN:  Are you having pain? Yes, chronic  4/10 in Rt shoulder  PRECAUTIONS: Right lymphedema risk  WEIGHT BEARING RESTRICTIONS: No  FALLS:  Has patient fallen in last 6 months? No  LIVING ENVIRONMENT: Lives with: husband and 2 children Lives in: House/apartment Stairs: Yes; Internal: 14 steps; can reach both and External: 0 steps; none   OCCUPATION: Administrative Asst, works from home  LEISURE: walking, work out with trainer,  HAND DOMINANCE: right   PRIOR LEVEL OF FUNCTION: Independent  PATIENT GOALS: Get my mobility back, decrease pain   OBJECTIVE:  COGNITION: Overall cognitive status: Within functional limits for tasks assessed   PALPATION: Very tender right UT, supraspinatus  OBSERVATIONS / OTHER ASSESSMENTS: Right UT compensation, decreased right scapular mobility with shoulder ROM  SENSATION: Light touch: Appears intact    POSTURE: forward head, rounded shoulders, trunk rotation  UPPER EXTREMITY AROM/PROM:  A/PROM RIGHT   eval  RIGHT 06/04/23  Shoulder extension 25 (pain ant. shoulder 32   Shoulder flexion 132, UT compensation 125 improved mechanics  Shoulder abduction 123, tight UT/arm   Shoulder internal rotation    Shoulder external rotation      (Blank rows = not tested)  A/PROM LEFT   eval  Shoulder extension 41  Shoulder flexion 153  Shoulder abduction 178  Shoulder internal rotation 72  Shoulder external rotation 95    (Blank rows = not tested)  CERVICAL AROM:WNL except tightness noted in bilateral UT  UPPER EXTREMITY STRENGTH:   LYMPHEDEMA ASSESSMENTS:   SURGERY TYPE/DATE: 07/25/2021 Right Mastectomy with SLNB and tissue expander, Tissue expander removed 12/04/21  NUMBER OF LYMPH NODES REMOVED: 4/7  CHEMOTHERAPY: YES, 08/25/21-01/19/22  RADIATION:YES, 02/26/22-04/04/2022  HORMONE TREATMENT: YES  INFECTIONS: Yes with need for tissue expander to be removed   LYMPHEDEMA ASSESSMENTS:   LANDMARK RIGHT  eval  At axilla  32.4  15 cm proximal to olecranon process   10 cm proximal to olecranon process 25.6  Olecranon process 24.7  15 cm proximal to ulnar styloid process   10 cm proximal to ulnar styloid process 21.0  Just proximal to ulnar styloid process 15.3  Across hand at thumb web space 19.1  At base of 2nd digit 5.8  (Blank rows = not tested)  LANDMARK LEFT  eval  At axilla  29  15 cm proximal to olecranon process   10 cm proximal to olecranon process 24.2  Olecranon process 24.3  15 cm proximal to ulnar styloid process   10 cm proximal to ulnar styloid process 20.3  Just proximal to ulnar styloid process 14.6  Across hand at thumb web space 18.8  At base of 2nd digit 5.9  (Blank rows = not tested)    GAIT: WNL  L-DEX LYMPHEDEMA SCREENING: The patient was assessed using the L-Dex machine today to produce a lymphedema index baseline score. The patient will be reassessed on a regular basis (typically every 3 months) to obtain new L-Dex scores. If the score is > 6.5 points away from his/her baseline score indicating onset of subclinical  lymphedema, it will be recommended to wear a compression garment for 4 weeks, 12 hours per day and then be reassessed. If the score continues to be > 6.5 points from baseline at reassessment, we will initiate lymphedema treatment. Assessing in this manner has a 95% rate of preventing clinically significant lymphedema. 04/30/2023 SCORE TODAY -1.7  QUICK DASH SURVEY: 61%   TODAY'S TREATMENT:                                                                                                                                          DATE:  06/05/23: Manual therapy P/ROM in supine to Rt shoulder intermittently during STM into flexion, abd and D2 with scapular depression by therapist throughout, pt still tight and demonstrates muscle guarding which limits her end motions, but more now just abd as her flex is much improved, and multiple VC's to relax during P/ROM but pt is able to perform this with improved ease when cued to do so STM in Lt S/L to Rt medial scapular border, then in supine to lateral border of scapula where pt palpably tight and reports feeling pinching with deep pressure, this improved by end of session Scap  Mobs in Lt S/L into protraction and retraction MFR to Rt medial upper arm where pt feels cording like pull, her tolerance for this was low today due to hypersensitivity  06/04/23: NuStep L5 x 3' PT present to discuss status Supine AA/ROM bil shoulder flexion using cane + 4lb ankle weight for stretch 8x5" holds ("this feels so good!") Prone Rt shoulder extension, row, horiz abd, Y (lower trap lift) x10 each, PT providing manual facil of scapular mechanics Lt SL Rt scapular clocks with manual facilitation and then manual resistance Trigger Point Dry-Needling  Treatment instructions: Expect mild to moderate muscle soreness. S/S of pneumothorax if dry needled over a lung field, and to seek immediate medical attention should they occur. Patient verbalized understanding of these instructions  and education.  Patient Consent Given: Yes Education handout provided: Yes Muscles treated: Rt pecs and Rt upper trap Electrical stimulation performed: No Parameters: N/A Treatment response/outcome: signif twitch in pecs > Rt upper trap with release of tension Pt encouraged to get shoulder pulleys, focus on towel stretch (yes, into pain), supine dowel flexion with added weight, scapular clocks in Lt SL for HEP (no formal updates in chart due to time)   05/28/23: NuStep L5 x 3' PT present to discuss status and plan for session Seated levator and upper trap stretch with overpressure bil 1x20" each Rt wall slide with fwd weight shift - initial reps PT facilitating scapular mechanics Standing red tband: Rt shoulder IR, ER x10 each, standing lower trap W x 10, lower trap setting lift offs from wall in overhead V x10 bil Supine joint mobs GH joint on Rt Gr III/IV at end range for abduction/IR Supine and prone DN to Rt lat, prone DN to Rt upper trap - signif twitch in upper trap HEP advanced, given red tband and handouts  05/23/23: Manual therapy P/ROM in supine to Rt shoulder intermittently during STM into flexion, abd and D2 with scapular depression by therapist throughout, pt still tight and demonstrates muscle guarding which limits her end motions, but more now just abd as her flex is much improved, and multiple VC's to relax during P/ROM STM with cocoa butter in Lt S/L to Rt medial scapular border and UT, anterior and posterior shoulder and pectoralis insertion MFR to Rt medial upper arm where pt feels cording like pull, but was unable to palpate such  05/21/23: Cervical A/ROM bil Rot x 10  Rt shoulder flexion A/ROM x 5 Trigger Point Dry-Needling  Treatment instructions: Expect mild to moderate muscle soreness. S/S of pneumothorax if dry needled over a lung field, and to seek immediate medical attention should they occur. Patient verbalized understanding of these instructions and  education.  Patient Consent Given: Yes Education handout provided: Previously provided Muscles treated: Rt upper trap, Rt infraspinatus, Rt lat, Rt posterior delt and teres minor/major Electrical stimulation performed: No Parameters: N/A Treatment response/outcome: signif twitch and release Rt upper trap and lat, improved neck ROM  Demo'd levator scapula stretch with overpressure to add to HEP - unable to add due to out of time - add next time     PATIENT EDUCATION:  Education details: [email or web address], Discussed POC, DN mobilizations, STM, PROM, strength, decrease UT compensation Person educated: Patient Education method: Explanation Education comprehension: verbalized understanding  HOME EXERCISE PROGRAM: Access Code: [email or web address] URL: [email or web address] Date: 05/28/2023 Prepared by: Allison Reed Beuhring  Exercises - Seated Levator Scapulae Stretch  - 1 x daily - 7 x weekly - 1 sets - 2 reps - 20 hold - Seated  Upper Trapezius Stretch  - 1 x daily - 7 x weekly - 1 sets - 2 reps - 20 hold - Shoulder Lat Pull Down with Resistance  - 1 x daily - 7 x weekly - 2 sets - 10 reps - Low Trap Setting at Wall  - 1 x daily - 7 x weekly - 3 sets - 5 reps - Shoulder External Rotation with Anchored Resistance  - 1 x daily - 7 x weekly - 2 sets - 10 reps - Standing Shoulder Internal Rotation with Anchored Resistance  - 1 x daily - 7 x weekly - 2 sets - 10 reps  ASSESSMENT:  CLINICAL IMPRESSION: Briefly reviewed new exs issued yesterday and pt was doing these with proper technique. She reports the [facility] dowel with a weight on it feels the best and gives her a great stretch. Did discuss with pt within scope of practice that she may want to consider seeing an orthopedist for her Rt shoulder for a more definitive shoulder diagnosis as this is becoming a more prolonged and chronic issue. Also because PT yesterday discussed possibility of frozen shoulder, so orthopedist would be able to either  definitively diagnosis or rule out. Pt verbalized understanding. She is going out to town for a bit tomorrow for a cruise and will consider this when she returns.    OBJECTIVE IMPAIRMENTS: decreased knowledge of condition, decreased ROM, decreased strength, increased fascial restrictions, impaired flexibility, impaired UE functional use, postural dysfunction, and pain.   ACTIVITY LIMITATIONS: lifting, sleeping, and reach over head  PARTICIPATION LIMITATIONS:  [place] dancing, working out  PERSONAL FACTORS: 1-2 comorbidities: Right breast cancer s/p chemotherapy and radiation  are also affecting patient's functional outcome.   REHAB POTENTIAL: Good  CLINICAL DECISION MAKING: Stable/uncomplicated  EVALUATION COMPLEXITY: Low  GOALS: Goals reviewed with patient? Yes  SHORT TERM GOALS: Target date: 05/28/2023  Independent with HEP for Right shoulder ROM, strength/stabilization Baseline: Goal status:ONGOING  2.  Decreased pain in Right UT/pecs/ medial arm by 25 % Baseline:  Goal status: MET 6/25  3.  Shoulder flexion and abd improved by 10-12 degrees for improved reaching Baseline:  Goal status: MET Improved shoulder flexion and abd by 15-20 degrees  4.  Improved right scapulo -humeral rhythm and with decreased UT compensation Baseline:  Goal status: ONGOING   LONG TERM GOALS: Target date: 06/25/23  Right UT/pec/arm pain improved by atleast 50%  Baseline:  Goal status: INITIAL  2.  Quick dash no greater than 20%for improved function Baseline:  Goal status: INITIAL  3.   Able to perform [place] dancing with correct arm position without increased pain/compensation Baseline:  Goal status: INITIAL   4.    Improved right scapulo -humeral rhythm and with decreased UT compensation Baseline:  Goal status: INITIAL   5.  Pt will have improved ability to sleep on the right side without increased pain. Baseline:  Goal status: INITIAL  PLAN:  PT FREQUENCY: 2x/week  PT  DURATION: 8 weeks  PLANNED INTERVENTIONS: Therapeutic exercises, Therapeutic activity, Neuromuscular re-education, Patient/Family education, Self Care, Joint mobilization, Dry Needling, Manual lymph drainage, scar mobilization, Ionotophoresis 4mg /ml Dexamethasone, Manual therapy, and Re-evaluation  PLAN FOR NEXT SESSION: review HEP progression, progress scapular training and mechanics as tol, Cont UT/levator stretches, cont DN to UT/pecs and other areas prn, STM prn, scapular and GH mobs, PROM, lower trap strengthening to decrease compensation, functional training  Berna Spare, PTA 06/05/23 1:11 PM

## 2023-06-18 ENCOUNTER — Ambulatory Visit: Payer: 59

## 2023-06-18 ENCOUNTER — Ambulatory Visit: Payer: 59 | Attending: Hematology and Oncology

## 2023-06-18 DIAGNOSIS — Z170 Estrogen receptor positive status [ER+]: Secondary | ICD-10-CM | POA: Insufficient documentation

## 2023-06-18 DIAGNOSIS — R293 Abnormal posture: Secondary | ICD-10-CM | POA: Diagnosis present

## 2023-06-18 DIAGNOSIS — M25611 Stiffness of right shoulder, not elsewhere classified: Secondary | ICD-10-CM | POA: Diagnosis present

## 2023-06-18 DIAGNOSIS — C50811 Malignant neoplasm of overlapping sites of right female breast: Secondary | ICD-10-CM | POA: Insufficient documentation

## 2023-06-18 DIAGNOSIS — Z483 Aftercare following surgery for neoplasm: Secondary | ICD-10-CM | POA: Diagnosis present

## 2023-06-18 NOTE — Therapy (Signed)
OUTPATIENT PHYSICAL THERAPY  UPPER EXTREMITY ONCOLOGY TREATMENT  Patient Name: Allison Reed MRN: [id number] DOB:Apr 07, 1976, 47 y.o., female Today's Date: 06/18/2023  END OF SESSION:  PT End of Session - 06/18/23 0905     Visit Number 11    Number of Visits 16    Date for PT Re-Evaluation 06/25/23    PT Start Time 0901    PT Stop Time 0959    PT Time Calculation (min) 58 min    Activity Tolerance Patient tolerated treatment well    Behavior During Therapy [facility] for tasks assessed/performed                Past Medical History:  Diagnosis Date   Diabetes mellitus without complication (HCC)    Type 2   Family history of brain tumor    Family history of skin cancer    Gestational diabetes    Headache    otc med prn   Heartburn in pregnancy    Incompetent cervix in pregnancy 04/2015   Missed abortion    no surgery required   Personal history of chemotherapy    Personal history of radiation therapy    Postpartum care following cesarean delivery (11/2) 10/12/2015   right breast ca 06/2021   UTI in pregnancy 03/2015   recent UTI, treatment completed   Past Surgical History:  Procedure Laterality Date   APPENDECTOMY  12/10/2004   BREAST BIOPSY Right 2022   BREAST BIOPSY Left 2022   BREAST RECONSTRUCTION WITH PLACEMENT OF TISSUE EXPANDER AND ALLODERM Right 07/25/2021   Procedure: RIGHT BREAST RECONSTRUCTION WITH PLACEMENT OF TISSUE EXPANDER AND ALLODERM;  Surgeon: Glenna Fellows, MD;  Location: [facility];  Service: Plastics;  Laterality: Right;   CERCLAGE LAPAROSCOPIC ABDOMINAL N/A 04/15/2015   Procedure: LAPAROSCOPIC TRANSABDOMINAL CERVCOISTHMIC CERCLAGE;  Surgeon: Fermin Schwab, MD;  Location: [facility];  Service: Gynecology;  Laterality: N/A;   CERVICAL CERCLAGE  02/08/2011   vaginal   CESAREAN SECTION N/A 10/12/2015   Procedure: CESAREAN SECTION;  Surgeon: Genia Del, MD;  Location: [facility];  Service: Obstetrics;  Laterality: N/A;    CESAREAN SECTION N/A 03/03/2018   Procedure: Repeat CESAREAN SECTION/Removal of Abdominal Cerclage;  Surgeon: Shea Evans, MD;  Location: [facility];  Service: Obstetrics;  Laterality: N/A;  EDD: 03/28/18   MASTECTOMY Right 2022   MASTECTOMY W/ SENTINEL NODE BIOPSY Right 07/25/2021   Procedure: RIGHT MASTECTOMY WITH AXILLARY SENTINEL LYMPH NODE BIOPSY;  Surgeon: Emelia Loron, MD;  Location: [facility];  Service: General;  Laterality: Right;   PORTACATH PLACEMENT N/A 08/24/2021   Procedure: INSERTION PORT-A-CATH;  Surgeon: Emelia Loron, MD;  Location: [facility];  Service: General;  Laterality: N/A;   RADIOACTIVE SEED GUIDED AXILLARY SENTINEL LYMPH NODE Right 07/25/2021   Procedure: RADIOACTIVE SEED GUIDED RIGHT AXILLARY NODE EXCISION;  Surgeon: Emelia Loron, MD;  Location: [facility];  Service: General;  Laterality: Right;   TISSUE EXPANDER PLACEMENT Right 12/04/2021   Procedure: REMOVAL RIGHT CHEST TISSUE EXPANDER;  Surgeon: Glenna Fellows, MD;  Location: [facility];  Service: Plastics;  Laterality: Right;   WISDOM TOOTH EXTRACTION  12/11/1999   Patient Active Problem List   Diagnosis Date Noted   Lower leg mass, left 05/02/2022   Port-A-Cath in place 08/25/2021   S/P mastectomy, right 07/25/2021   Genetic testing 07/01/2021   Family history of skin cancer 06/22/2021   Family history of brain tumor 06/22/2021   Malignant neoplasm of overlapping sites of right  breast in female, estrogen receptor positive (HCC) 06/21/2021   Status post repeat low transverse cesarean section 03/03/2018   DM (diabetes mellitus) in pregnancy 03/02/2018   Incompetent cervix in pregnancy 03/02/2018    PCP: Dr. Adrian Prince, MD  REFERRING PROVIDER: [name], MD  REFERRING DIAG: S/p Right Mastectomy  THERAPY DIAG:  Stiffness of right shoulder, not elsewhere classified  Abnormal posture  Malignant neoplasm of overlapping sites  of right breast in female, estrogen receptor positive [facility])  Aftercare following surgery for neoplasm  ONSET DATE: April 22,2024  Rationale for Evaluation and Treatment: Rehabilitation  SUBJECTIVE:                                                                                                                                                                                           SUBJECTIVE STATEMENT:  I just got back from my family vacation. Overall it went well but I can tell my Rt UE tightness is worse from sleeping on different beds and I can feel cording again in my Rt upper arm again. 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    PERTINENT HISTORY:  Pt is s/p Right Mastectomy with Tissue Expander and 4+/7 LN on 07/25/21. She is s/p 6 months of chemo followed by radiation. She had emergency surgery to remove tissue expander due to infection on 12/04/2021. She completed radiation on 04/04/22. She will be having right breast reconstruction on 06/24/2023.  PAIN:  Are you having pain? Yes, chronic  4/10 in Rt shoulder  PRECAUTIONS: Right lymphedema risk  WEIGHT BEARING RESTRICTIONS: No  FALLS:  Has patient fallen in last 6 months? No  LIVING ENVIRONMENT: Lives with: husband and 2 children Lives in: House/apartment Stairs: Yes; Internal: 14 steps; can reach both and External: 0 steps; none   OCCUPATION: Administrative Asst, works from home  LEISURE: walking, work out with trainer,  HAND DOMINANCE: right   PRIOR LEVEL OF FUNCTION: Independent  PATIENT GOALS: Get my mobility back, decrease pain   OBJECTIVE:  COGNITION: Overall cognitive status: Within functional limits for tasks assessed   PALPATION: Very tender right UT, supraspinatus  OBSERVATIONS / OTHER ASSESSMENTS: Right UT compensation, decreased right scapular mobility with shoulder ROM  SENSATION: Light touch: Appears intact    POSTURE: forward head, rounded  shoulders, trunk rotation  UPPER EXTREMITY AROM/PROM:  A/PROM RIGHT   eval  RIGHT 06/04/23 RIGHT 06/18/23  Shoulder extension 25 (pain ant. shoulder 32 35 (pain ant shoulder)  Shoulder flexion 132, UT compensation 125 improved mechanics 97  Shoulder abduction 123, tight UT/arm  118  with UT pain  Shoulder internal rotation     Shoulder external rotation       (Blank rows = not tested)  A/PROM LEFT   eval  Shoulder extension 41  Shoulder flexion 153  Shoulder abduction 178  Shoulder internal rotation 72  Shoulder external rotation 95    (Blank rows = not tested)  CERVICAL AROM:WNL except tightness noted in bilateral UT  UPPER EXTREMITY STRENGTH:   LYMPHEDEMA ASSESSMENTS:   SURGERY TYPE/DATE: 07/25/2021 Right Mastectomy with SLNB and tissue expander, Tissue expander removed 12/04/21  NUMBER OF LYMPH NODES REMOVED: 4/7  CHEMOTHERAPY: YES, 08/25/21-01/19/22  RADIATION:YES, 02/26/22-04/04/2022  HORMONE TREATMENT: YES  INFECTIONS: Yes with need for tissue expander to be removed   LYMPHEDEMA ASSESSMENTS:   LANDMARK RIGHT  eval  At axilla  32.4  15 cm proximal to olecranon process   10 cm proximal to olecranon process 25.6  Olecranon process 24.7  15 cm proximal to ulnar styloid process   10 cm proximal to ulnar styloid process 21.0  Just proximal to ulnar styloid process 15.3  Across hand at thumb web space 19.1  At base of 2nd digit 5.8  (Blank rows = not tested)  LANDMARK LEFT  eval  At axilla  29  15 cm proximal to olecranon process   10 cm proximal to olecranon process 24.2  Olecranon process 24.3  15 cm proximal to ulnar styloid process   10 cm proximal to ulnar styloid process 20.3  Just proximal to ulnar styloid process 14.6  Across hand at thumb web space 18.8  At base of 2nd digit 5.9  (Blank rows = not tested)    GAIT: WNL  L-DEX LYMPHEDEMA SCREENING: The patient was assessed using the L-Dex machine today to produce a lymphedema index baseline  score. The patient will be reassessed on a regular basis (typically every 3 months) to obtain new L-Dex scores. If the score is > 6.5 points away from his/her baseline score indicating onset of subclinical lymphedema, it will be recommended to wear a compression garment for 4 weeks, 12 hours per day and then be reassessed. If the score continues to be > 6.5 points from baseline at reassessment, we will initiate lymphedema treatment. Assessing in this manner has a 95% rate of preventing clinically significant lymphedema. 04/30/2023 SCORE TODAY -1.7  QUICK DASH SURVEY: 61%   TODAY'S TREATMENT:                                                                                                                                          DATE:  06/18/23: Self Care Discussed current functional status since pt just recently returned from her trip. She reports her Rt UT feels tight again with palpable trigger points and is starting to feel a pinch around her shoulder blade with Rt shoulder flexion. She feels her cervical A/ROM is good but does feel her UT  pulling at end motions. Also feels the cording still in her upper arm, a bit worse than when she was here last before her trip.  Manual therapy P/ROM in supine to Rt shoulder intermittently during STM into flexion, abd and D2 with scapular depression by therapist throughout, pt still tight and demonstrates muscle guarding which limits her end motions with her abd still being more limited due to frozen shoulder. Continued with multiple VC's to relax during P/ROM  STM with cocoa butter in Lt S/L to Rt medial scapular border and UT where trigger points palpable Scap Mobs in Lt S/L into protraction and retraction, very limited mobility noted but this improved some after MFR to Rt medial upper arm where cording palpable  06/05/23: Manual therapy P/ROM in supine to Rt shoulder intermittently during STM into flexion, abd and D2 with scapular depression by therapist  throughout, pt still tight and demonstrates muscle guarding which limits her end motions, but more now just abd as her flex is much improved, and multiple VC's to relax during P/ROM but pt is able to perform this with improved ease when cued to do so STM in Lt S/L to Rt medial scapular border, then in supine to lateral border of scapula where pt palpably tight and reports feeling pinching with deep pressure, this improved by end of session Scap Mobs in Lt S/L into protraction and retraction MFR to Rt medial upper arm where pt feels cording like pull, her tolerance for this was low today due to hypersensitivity  06/04/23: NuStep L5 x 3' PT present to discuss status Supine AA/ROM bil shoulder flexion using cane + 4lb ankle weight for stretch 8x5" holds ("this feels so good!") Prone Rt shoulder extension, row, horiz abd, Y (lower trap lift) x10 each, PT providing manual facil of scapular mechanics Lt SL Rt scapular clocks with manual facilitation and then manual resistance Trigger Point Dry-Needling  Treatment instructions: Expect mild to moderate muscle soreness. S/S of pneumothorax if dry needled over a lung field, and to seek immediate medical attention should they occur. Patient verbalized understanding of these instructions and education.  Patient Consent Given: Yes Education handout provided: Yes Muscles treated: Rt pecs and Rt upper trap Electrical stimulation performed: No Parameters: N/A Treatment response/outcome: signif twitch in pecs > Rt upper trap with release of tension Pt encouraged to get shoulder pulleys, focus on towel stretch (yes, into pain), supine dowel flexion with added weight, scapular clocks in Lt SL for HEP (no formal updates in chart due to time)     PATIENT EDUCATION:  Education details: [email or web address], Discussed POC, DN mobilizations, STM, PROM, strength, decrease UT compensation Person educated: Patient Education method: Explanation Education comprehension:  verbalized understanding  HOME EXERCISE PROGRAM: Access Code: [email or web address] URL: [email or web address] Date: 05/28/2023 Prepared by: [name] Beuhring  Exercises - Seated Levator Scapulae Stretch  - 1 x daily - 7 x weekly - 1 sets - 2 reps - 20 hold - Seated Upper Trapezius Stretch  - 1 x daily - 7 x weekly - 1 sets - 2 reps - 20 hold - Shoulder Lat Pull Down with Resistance  - 1 x daily - 7 x weekly - 2 sets - 10 reps - Low Trap Setting at Wall  - 1 x daily - 7 x weekly - 3 sets - 5 reps - Shoulder External Rotation with Anchored Resistance  - 1 x daily - 7 x weekly - 2 sets - 10 reps - Standing Shoulder Internal Rotation with Anchored Resistance  -  1 x daily - 7 x weekly - 2 sets - 10 reps  ASSESSMENT:  CLINICAL IMPRESSION: Pt returns after being on vacation. She feels some of her tightness got worse from sleeping on different beds/pillows, especially at Rt UT and scapular area. Scapula pinch limits her end A/ROM which is less today than when she was last measured. She reports she saw her PCP before she left on trip and he did say she has frozen shoulder. Discussed if pt wants to cont when POC expires next week and pt would like to as her motion is less but she still is feeling that physical therapy is beneficial in helping to decrease effects of frozen shoulder. She would also like to cont DN appts as she feels this greatly help to decrease trigger points that limit her Rt shoulder A/ROM due to pain.    OBJECTIVE IMPAIRMENTS: decreased knowledge of condition, decreased ROM, decreased strength, increased fascial restrictions, impaired flexibility, impaired UE functional use, postural dysfunction, and pain.   ACTIVITY LIMITATIONS: lifting, sleeping, and reach over head  PARTICIPATION LIMITATIONS:  [place] dancing, working out  PERSONAL FACTORS: 1-2 comorbidities: Right breast cancer s/p chemotherapy and radiation  are also affecting patient's functional outcome.   REHAB  POTENTIAL: Good  CLINICAL DECISION MAKING: Stable/uncomplicated  EVALUATION COMPLEXITY: Low  GOALS: Goals reviewed with patient? Yes  SHORT TERM GOALS: Target date: 05/28/2023  Independent with HEP for Right shoulder ROM, strength/stabilization Baseline: Goal status:ONGOING  2.  Decreased pain in Right UT/pecs/ medial arm by 25 % Baseline:  Goal status: MET 6/25  3.  Shoulder flexion and abd improved by 10-12 degrees for improved reaching Baseline:  Goal status: MET Improved shoulder flexion and abd by 15-20 degrees  4.  Improved right scapulo -humeral rhythm and with decreased UT compensation Baseline:  Goal status: ONGOING   LONG TERM GOALS: Target date: 06/25/23  Right UT/pec/arm pain improved by atleast 50%  Baseline: 06/18/23 - 40% improvement at this time ; Goal status: PROGRESSING  2.  Quick dash no greater than 20%for improved function Baseline:  Goal status: INITIAL  3.   Able to perform [place] dancing with correct arm position without increased pain/compensation Baseline:  Goal status: INITIAL   4.    Improved right scapulo -humeral rhythm and with decreased UT compensation Baseline: 06/18/23 - pt feels scapulo - humeral rhythm improved about 30% with Rt shoulder A/ROM Goal status: ONGOING   5.  Pt will have improved ability to sleep on the right side without increased pain. Baseline:  Goal status: INITIAL  PLAN:  PT FREQUENCY: 2x/week  PT DURATION: 8 weeks  PLANNED INTERVENTIONS: Therapeutic exercises, Therapeutic activity, Neuromuscular re-education, Patient/Family education, Self Care, Joint mobilization, Dry Needling, Manual lymph drainage, scar mobilization, Ionotophoresis 4mg /ml Dexamethasone, Manual therapy, and Re-evaluation  PLAN FOR NEXT SESSION: If PT agrees, renewal required end of next week; review HEP progression, progress scapular training and mechanics as tol, Cont UT/levator stretches, cont DN to UT/pecs and other areas prn, STM prn,  scapular and GH mobs, PROM, lower trap strengthening to decrease compensation, functional training  Berna Spare, PTA 06/18/23 11:39 AM

## 2023-06-19 ENCOUNTER — Ambulatory Visit: Payer: 59

## 2023-06-24 ENCOUNTER — Telehealth: Payer: Self-pay

## 2023-06-24 ENCOUNTER — Ambulatory Visit: Payer: 59

## 2023-06-24 DIAGNOSIS — Z483 Aftercare following surgery for neoplasm: Secondary | ICD-10-CM

## 2023-06-24 DIAGNOSIS — Z170 Estrogen receptor positive status [ER+]: Secondary | ICD-10-CM

## 2023-06-24 DIAGNOSIS — M25611 Stiffness of right shoulder, not elsewhere classified: Secondary | ICD-10-CM

## 2023-06-24 DIAGNOSIS — R293 Abnormal posture: Secondary | ICD-10-CM

## 2023-06-24 NOTE — Telephone Encounter (Signed)
Returned Pt's call regarding Zoladex Saving Program. Pt states she receives "A $600 bill from [facility] because of Zoladex injections" and that she received information in the mail regarding a Zoladex Savings Program. Secure chat sent to Lurline Hare and Domenick Bookbinder with below information:  Brandi: "Good morning - the only charges I see that she owes is $158 this is for her May 2024 treatments. Everything else is either pending insurance or has been wrote off."  Fleet Contras: "It looks like she has a coinsurance so the cost is correct. she's not currently signed up for copay assistance, but I can get started on that. Once/if she's approved it will be effective for a certain amount of time and dollar amount. It will just depend on how quickly she goes through that. but I won't be sure on the details until I get the approval. I will see if her ID number [id number] is active"  Gave above information to Pt who verbalized understanding.

## 2023-06-24 NOTE — Therapy (Signed)
OUTPATIENT PHYSICAL THERAPY  UPPER EXTREMITY ONCOLOGY TREATMENT  Patient Name: Allison Reed MRN: [id number] DOB:18-Apr-1976, 47 y.o., female Today's Date: 06/24/2023  END OF SESSION:  PT End of Session - 06/24/23 1509     Visit Number 12    Number of Visits 16    Date for PT Re-Evaluation 06/25/23    PT Start Time 1503    PT Stop Time 1557    PT Time Calculation (min) 54 min    Activity Tolerance Patient tolerated treatment well    Behavior During Therapy [facility] for tasks assessed/performed                Past Medical History:  Diagnosis Date   Diabetes mellitus without complication (HCC)    Type 2   Family history of brain tumor    Family history of skin cancer    Gestational diabetes    Headache    otc med prn   Heartburn in pregnancy    Incompetent cervix in pregnancy 04/2015   Missed abortion    no surgery required   Personal history of chemotherapy    Personal history of radiation therapy    Postpartum care following cesarean delivery (11/2) 10/12/2015   right breast ca 06/2021   UTI in pregnancy 03/2015   recent UTI, treatment completed   Past Surgical History:  Procedure Laterality Date   APPENDECTOMY  12/10/2004   BREAST BIOPSY Right 2022   BREAST BIOPSY Left 2022   BREAST RECONSTRUCTION WITH PLACEMENT OF TISSUE EXPANDER AND ALLODERM Right 07/25/2021   Procedure: RIGHT BREAST RECONSTRUCTION WITH PLACEMENT OF TISSUE EXPANDER AND ALLODERM;  Surgeon: Glenna Fellows, MD;  Location: [facility];  Service: Plastics;  Laterality: Right;   CERCLAGE LAPAROSCOPIC ABDOMINAL N/A 04/15/2015   Procedure: LAPAROSCOPIC TRANSABDOMINAL CERVCOISTHMIC CERCLAGE;  Surgeon: Fermin Schwab, MD;  Location: [facility];  Service: Gynecology;  Laterality: N/A;   CERVICAL CERCLAGE  02/08/2011   vaginal   CESAREAN SECTION N/A 10/12/2015   Procedure: CESAREAN SECTION;  Surgeon: Genia Del, MD;  Location: [facility];  Service: Obstetrics;  Laterality: N/A;    CESAREAN SECTION N/A 03/03/2018   Procedure: Repeat CESAREAN SECTION/Removal of Abdominal Cerclage;  Surgeon: Shea Evans, MD;  Location: [facility];  Service: Obstetrics;  Laterality: N/A;  EDD: 03/28/18   MASTECTOMY Right 2022   MASTECTOMY W/ SENTINEL NODE BIOPSY Right 07/25/2021   Procedure: RIGHT MASTECTOMY WITH AXILLARY SENTINEL LYMPH NODE BIOPSY;  Surgeon: Emelia Loron, MD;  Location: [facility];  Service: General;  Laterality: Right;   PORTACATH PLACEMENT N/A 08/24/2021   Procedure: INSERTION PORT-A-CATH;  Surgeon: Emelia Loron, MD;  Location: [facility];  Service: General;  Laterality: N/A;   RADIOACTIVE SEED GUIDED AXILLARY SENTINEL LYMPH NODE Right 07/25/2021   Procedure: RADIOACTIVE SEED GUIDED RIGHT AXILLARY NODE EXCISION;  Surgeon: Emelia Loron, MD;  Location: [facility];  Service: General;  Laterality: Right;   TISSUE EXPANDER PLACEMENT Right 12/04/2021   Procedure: REMOVAL RIGHT CHEST TISSUE EXPANDER;  Surgeon: Glenna Fellows, MD;  Location: [facility];  Service: Plastics;  Laterality: Right;   WISDOM TOOTH EXTRACTION  12/11/1999   Patient Active Problem List   Diagnosis Date Noted   Lower leg mass, left 05/02/2022   Port-A-Cath in place 08/25/2021   S/P mastectomy, right 07/25/2021   Genetic testing 07/01/2021   Family history of skin cancer 06/22/2021   Family history of brain tumor 06/22/2021   Malignant neoplasm of overlapping sites of right  breast in female, estrogen receptor positive (HCC) 06/21/2021   Status post repeat low transverse cesarean section 03/03/2018   DM (diabetes mellitus) in pregnancy 03/02/2018   Incompetent cervix in pregnancy 03/02/2018    PCP: Dr. Adrian Prince, MD  REFERRING PROVIDER: [name], MD  REFERRING DIAG: S/p Right Mastectomy  THERAPY DIAG:  Stiffness of right shoulder, not elsewhere classified  Abnormal posture  Malignant neoplasm of overlapping sites  of right breast in female, estrogen receptor positive [facility])  Aftercare following surgery for neoplasm  ONSET DATE: April 22,2024  Rationale for Evaluation and Treatment: Rehabilitation  SUBJECTIVE:                                                                                                                                                                                           SUBJECTIVE STATEMENT:  I've been using my pulleys at home to work on IR, I want you to tell me if I'm doing it right. My neck is feeling so much better.   PERTINENT HISTORY:  Pt is s/p Right Mastectomy with Tissue Expander and 4+/7 LN on 07/25/21. She is s/p 6 months of chemo followed by radiation. She had emergency surgery to remove tissue expander due to infection on 12/04/2021. She completed radiation on 04/04/22. She will be having right breast reconstruction on 06/24/2023.  PAIN:  Are you having pain? Yes, chronic  4/10 in Rt shoulder especially when I do the IR stretch with the pulleys  PRECAUTIONS: Right lymphedema risk  WEIGHT BEARING RESTRICTIONS: No  FALLS:  Has patient fallen in last 6 months? No  LIVING ENVIRONMENT: Lives with: husband and 2 children Lives in: House/apartment Stairs: Yes; Internal: 14 steps; can reach both and External: 0 steps; none   OCCUPATION: Administrative Asst, works from home  LEISURE: walking, work out with trainer,  HAND DOMINANCE: right   PRIOR LEVEL OF FUNCTION: Independent  PATIENT GOALS: Get my mobility back, decrease pain   OBJECTIVE:  COGNITION: Overall cognitive status: Within functional limits for tasks assessed   PALPATION: Very tender right UT, supraspinatus  OBSERVATIONS / OTHER ASSESSMENTS: Right UT compensation, decreased right scapular mobility with shoulder ROM  SENSATION: Light touch: Appears intact    POSTURE: forward head, rounded shoulders, trunk rotation  UPPER EXTREMITY AROM/PROM:  A/PROM RIGHT   eval  RIGHT 06/04/23  RIGHT 06/18/23  Shoulder extension 25 (pain ant. shoulder 32 35 (pain ant shoulder)  Shoulder flexion 132, UT compensation 125 improved mechanics 97  Shoulder abduction 123, tight UT/arm  118 with UT pain  Shoulder internal rotation     Shoulder external rotation       (  Blank rows = not tested)  A/PROM LEFT   eval  Shoulder extension 41  Shoulder flexion 153  Shoulder abduction 178  Shoulder internal rotation 72  Shoulder external rotation 95    (Blank rows = not tested)  CERVICAL AROM:WNL except tightness noted in bilateral UT  UPPER EXTREMITY STRENGTH:   LYMPHEDEMA ASSESSMENTS:   SURGERY TYPE/DATE: 07/25/2021 Right Mastectomy with SLNB and tissue expander, Tissue expander removed 12/04/21  NUMBER OF LYMPH NODES REMOVED: 4/7  CHEMOTHERAPY: YES, 08/25/21-01/19/22  RADIATION:YES, 02/26/22-04/04/2022  HORMONE TREATMENT: YES  INFECTIONS: Yes with need for tissue expander to be removed   LYMPHEDEMA ASSESSMENTS:   LANDMARK RIGHT  eval  At axilla  32.4  15 cm proximal to olecranon process   10 cm proximal to olecranon process 25.6  Olecranon process 24.7  15 cm proximal to ulnar styloid process   10 cm proximal to ulnar styloid process 21.0  Just proximal to ulnar styloid process 15.3  Across hand at thumb web space 19.1  At base of 2nd digit 5.8  (Blank rows = not tested)  LANDMARK LEFT  eval  At axilla  29  15 cm proximal to olecranon process   10 cm proximal to olecranon process 24.2  Olecranon process 24.3  15 cm proximal to ulnar styloid process   10 cm proximal to ulnar styloid process 20.3  Just proximal to ulnar styloid process 14.6  Across hand at thumb web space 18.8  At base of 2nd digit 5.9  (Blank rows = not tested)    GAIT: WNL  L-DEX LYMPHEDEMA SCREENING: The patient was assessed using the L-Dex machine today to produce a lymphedema index baseline score. The patient will be reassessed on a regular basis (typically every 3 months) to obtain new  L-Dex scores. If the score is > 6.5 points away from his/her baseline score indicating onset of subclinical lymphedema, it will be recommended to wear a compression garment for 4 weeks, 12 hours per day and then be reassessed. If the score continues to be > 6.5 points from baseline at reassessment, we will initiate lymphedema treatment. Assessing in this manner has a 95% rate of preventing clinically significant lymphedema. 04/30/2023 SCORE TODAY -1.7  QUICK DASH SURVEY: 61%   TODAY'S TREATMENT:                                                                                                                                          DATE:  06/24/23: Therapeutic Exercises Pulleys into IR standing at door for review as pt reports she has been doing these at home and wanted her technique assessed. She was feeling the beginning of Rt UT compensation and stopping at that point but did also need cuing to keep upper arm at side and not allow for shoulder abduction Manual Therapy Trigger Point Dry-Needling  Treatment instructions: Expect mild to moderate muscle soreness. S/S of pneumothorax  if dry needled over a lung field, and to seek immediate medical attention should they occur. Patient verbalized understanding of these instructions and education.  Patient Consent Given: Yes Education handout provided: Previously provided Muscles treated: Rt upper trap and infraspinatus, lat Electrical stimulation performed: No Parameters: N/A Treatment response/outcome: signif twitch and release Rt upper trap and infraspinatus, no response in lat  DN performed by [name] Beuhring, PT  P/ROM in supine to Rt shoulder intermittently during STM into flexion, abd and D2 with scapular depression by therapist throughout, pt still tight and demonstrates muscle guarding which limits her end motions with her abd still being more limited due to frozen shoulder and pain. Continued with multiple VC's to relax during P/ROM though  she was more aware of this during session today and self corrected at times STM with cocoa butter in Lt S/L to Rt medial scapular border and UT where trigger points palpable Scap Mobs in Lt S/L into protraction and retraction, still with very limited mobility  MFR to Rt medial upper arm where cording visible by puckering at the skin    06/18/23: Self Care Discussed current functional status since pt just recently returned from her trip. She reports her Rt UT feels tight again with palpable trigger points and is starting to feel a pinch around her shoulder blade with Rt shoulder flexion. She feels her cervical A/ROM is good but does feel her UT pulling at end motions. Also feels the cording still in her upper arm, a bit worse than when she was here last before her trip.  Manual therapy P/ROM in supine to Rt shoulder intermittently during STM into flexion, abd and D2 with scapular depression by therapist throughout, pt still tight and demonstrates muscle guarding which limits her end motions with her abd still being more limited due to frozen shoulder. Continued with multiple VC's to relax during P/ROM  STM with cocoa butter in Lt S/L to Rt medial scapular border and UT where trigger points palpable Scap Mobs in Lt S/L into protraction and retraction, very limited mobility noted but this improved some after MFR to Rt medial upper arm where cording palpable  06/05/23: Manual therapy P/ROM in supine to Rt shoulder intermittently during STM into flexion, abd and D2 with scapular depression by therapist throughout, pt still tight and demonstrates muscle guarding which limits her end motions, but more now just abd as her flex is much improved, and multiple VC's to relax during P/ROM but pt is able to perform this with improved ease when cued to do so STM in Lt S/L to Rt medial scapular border, then in supine to lateral border of scapula where pt palpably tight and reports feeling pinching with deep  pressure, this improved by end of session Scap Mobs in Lt S/L into protraction and retraction MFR to Rt medial upper arm where pt feels cording like pull, her tolerance for this was low today due to hypersensitivity  06/04/23: NuStep L5 x 3' PT present to discuss status Supine AA/ROM bil shoulder flexion using cane + 4lb ankle weight for stretch 8x5" holds ("this feels so good!") Prone Rt shoulder extension, row, horiz abd, Y (lower trap lift) x10 each, PT providing manual facil of scapular mechanics Lt SL Rt scapular clocks with manual facilitation and then manual resistance Trigger Point Dry-Needling  Treatment instructions: Expect mild to moderate muscle soreness. S/S of pneumothorax if dry needled over a lung field, and to seek immediate medical attention should they occur. Patient verbalized understanding  of these instructions and education.  Patient Consent Given: Yes Education handout provided: Yes Muscles treated: Rt pecs and Rt upper trap Electrical stimulation performed: No Parameters: N/A Treatment response/outcome: signif twitch in pecs > Rt upper trap with release of tension Pt encouraged to get shoulder pulleys, focus on towel stretch (yes, into pain), supine dowel flexion with added weight, scapular clocks in Lt SL for HEP (no formal updates in chart due to time)     PATIENT EDUCATION:  Education details: [email or web address], Discussed POC, DN mobilizations, STM, PROM, strength, decrease UT compensation Person educated: Patient Education method: Explanation Education comprehension: verbalized understanding  HOME EXERCISE PROGRAM: Access Code: [email or web address] URL: [email or web address] Date: 05/28/2023 Prepared by: [name] Beuhring  Exercises - Seated Levator Scapulae Stretch  - 1 x daily - 7 x weekly - 1 sets - 2 reps - 20 hold - Seated Upper Trapezius Stretch  - 1 x daily - 7 x weekly - 1 sets - 2 reps - 20 hold - Shoulder Lat Pull Down with Resistance  - 1 x daily -  7 x weekly - 2 sets - 10 reps - Low Trap Setting at Wall  - 1 x daily - 7 x weekly - 3 sets - 5 reps - Shoulder External Rotation with Anchored Resistance  - 1 x daily - 7 x weekly - 2 sets - 10 reps - Standing Shoulder Internal Rotation with Anchored Resistance  - 1 x daily - 7 x weekly - 2 sets - 10 reps  ASSESSMENT:  CLINICAL IMPRESSION: PT was available for dry needling at beginning of session. Then continued with manual therapy working to decrease her Rt upper quadrant tightness and improve her Rt shoulder P/ROM that is currently limited due to frozen shoulder per pts PCP.    OBJECTIVE IMPAIRMENTS: decreased knowledge of condition, decreased ROM, decreased strength, increased fascial restrictions, impaired flexibility, impaired UE functional use, postural dysfunction, and pain.   ACTIVITY LIMITATIONS: lifting, sleeping, and reach over head  PARTICIPATION LIMITATIONS:  [place] dancing, working out  PERSONAL FACTORS: 1-2 comorbidities: Right breast cancer s/p chemotherapy and radiation  are also affecting patient's functional outcome.   REHAB POTENTIAL: Good  CLINICAL DECISION MAKING: Stable/uncomplicated  EVALUATION COMPLEXITY: Low  GOALS: Goals reviewed with patient? Yes  SHORT TERM GOALS: Target date: 05/28/2023  Independent with HEP for Right shoulder ROM, strength/stabilization Baseline: Goal status:ONGOING  2.  Decreased pain in Right UT/pecs/ medial arm by 25 % Baseline:  Goal status: MET 6/25  3.  Shoulder flexion and abd improved by 10-12 degrees for improved reaching Baseline:  Goal status: MET Improved shoulder flexion and abd by 15-20 degrees  4.  Improved right scapulo -humeral rhythm and with decreased UT compensation Baseline:  Goal status: ONGOING   LONG TERM GOALS: Target date: 06/25/23  Right UT/pec/arm pain improved by atleast 50%  Baseline: 06/18/23 - 40% improvement at this time ; Goal status: PROGRESSING  2.  Quick dash no greater than 20%for  improved function Baseline:  Goal status: INITIAL  3.   Able to perform [place] dancing with correct arm position without increased pain/compensation Baseline:  Goal status: INITIAL   4.    Improved right scapulo -humeral rhythm and with decreased UT compensation Baseline: 06/18/23 - pt feels scapulo - humeral rhythm improved about 30% with Rt shoulder A/ROM Goal status: ONGOING   5.  Pt will have improved ability to sleep on the right side without increased pain. Baseline:  Goal status: INITIAL  PLAN:  PT FREQUENCY:  2x/week  PT DURATION: 8 weeks  PLANNED INTERVENTIONS: Therapeutic exercises, Therapeutic activity, Neuromuscular re-education, Patient/Family education, Self Care, Joint mobilization, Dry Needling, Manual lymph drainage, scar mobilization, Ionotophoresis 4mg /ml Dexamethasone, Manual therapy, and Re-evaluation  PLAN FOR NEXT SESSION: PT to renew next; review HEP progression, progress scapular training and mechanics as tol, Cont UT/levator stretches, cont DN to UT/pecs and other areas prn, STM prn, scapular and GH mobs, PROM, lower trap strengthening to decrease compensation, functional training  Berna Spare, PTA 06/24/23 4:11 PM

## 2023-06-26 ENCOUNTER — Inpatient Hospital Stay: Payer: 59

## 2023-06-27 ENCOUNTER — Encounter: Payer: Self-pay | Admitting: Physical Therapy

## 2023-06-27 ENCOUNTER — Ambulatory Visit: Payer: 59 | Admitting: Physical Therapy

## 2023-06-27 DIAGNOSIS — R293 Abnormal posture: Secondary | ICD-10-CM

## 2023-06-27 DIAGNOSIS — M25611 Stiffness of right shoulder, not elsewhere classified: Secondary | ICD-10-CM | POA: Diagnosis not present

## 2023-06-27 NOTE — Therapy (Signed)
OUTPATIENT PHYSICAL THERAPY  UPPER EXTREMITY ONCOLOGY TREATMENT  Patient Name: ELLAGRACE YOSHIDA MRN: [id number] DOB:Aug 14, 1976, 47 y.o., female Today's Date: 06/27/2023  END OF SESSION:  PT End of Session - 06/27/23 0940     Visit Number 13    Number of Visits 16    Date for PT Re-Evaluation 08/22/23    PT Start Time 0932    PT Stop Time 1016    PT Time Calculation (min) 44 min    Activity Tolerance Patient tolerated treatment well    Behavior During Therapy [facility] for tasks assessed/performed                 Past Medical History:  Diagnosis Date   Diabetes mellitus without complication (HCC)    Type 2   Family history of brain tumor    Family history of skin cancer    Gestational diabetes    Headache    otc med prn   Heartburn in pregnancy    Incompetent cervix in pregnancy 04/2015   Missed abortion    no surgery required   Personal history of chemotherapy    Personal history of radiation therapy    Postpartum care following cesarean delivery (11/2) 10/12/2015   right breast ca 06/2021   UTI in pregnancy 03/2015   recent UTI, treatment completed   Past Surgical History:  Procedure Laterality Date   APPENDECTOMY  12/10/2004   BREAST BIOPSY Right 2022   BREAST BIOPSY Left 2022   BREAST RECONSTRUCTION WITH PLACEMENT OF TISSUE EXPANDER AND ALLODERM Right 07/25/2021   Procedure: RIGHT BREAST RECONSTRUCTION WITH PLACEMENT OF TISSUE EXPANDER AND ALLODERM;  Surgeon: Glenna Fellows, MD;  Location: [facility];  Service: Plastics;  Laterality: Right;   CERCLAGE LAPAROSCOPIC ABDOMINAL N/A 04/15/2015   Procedure: LAPAROSCOPIC TRANSABDOMINAL CERVCOISTHMIC CERCLAGE;  Surgeon: Fermin Schwab, MD;  Location: [facility];  Service: Gynecology;  Laterality: N/A;   CERVICAL CERCLAGE  02/08/2011   vaginal   CESAREAN SECTION N/A 10/12/2015   Procedure: CESAREAN SECTION;  Surgeon: Genia Del, MD;  Location: [facility];  Service: Obstetrics;  Laterality: N/A;    CESAREAN SECTION N/A 03/03/2018   Procedure: Repeat CESAREAN SECTION/Removal of Abdominal Cerclage;  Surgeon: Shea Evans, MD;  Location: [facility];  Service: Obstetrics;  Laterality: N/A;  EDD: 03/28/18   MASTECTOMY Right 2022   MASTECTOMY W/ SENTINEL NODE BIOPSY Right 07/25/2021   Procedure: RIGHT MASTECTOMY WITH AXILLARY SENTINEL LYMPH NODE BIOPSY;  Surgeon: Emelia Loron, MD;  Location: [facility];  Service: General;  Laterality: Right;   PORTACATH PLACEMENT N/A 08/24/2021   Procedure: INSERTION PORT-A-CATH;  Surgeon: Emelia Loron, MD;  Location: [facility];  Service: General;  Laterality: N/A;   RADIOACTIVE SEED GUIDED AXILLARY SENTINEL LYMPH NODE Right 07/25/2021   Procedure: RADIOACTIVE SEED GUIDED RIGHT AXILLARY NODE EXCISION;  Surgeon: Emelia Loron, MD;  Location:  SURGERY CENTER;  Service: General;  Laterality: Right;   TISSUE EXPANDER PLACEMENT Right 12/04/2021   Procedure: REMOVAL RIGHT CHEST TISSUE EXPANDER;  Surgeon: Glenna Fellows, MD;  Location: [facility];  Service: Plastics;  Laterality: Right;   WISDOM TOOTH EXTRACTION  12/11/1999   Patient Active Problem List   Diagnosis Date Noted   Lower leg mass, left 05/02/2022   Port-A-Cath in place 08/25/2021   S/P mastectomy, right 07/25/2021   Genetic testing 07/01/2021   Family history of skin cancer 06/22/2021   Family history of brain tumor 06/22/2021   Malignant neoplasm of overlapping sites of  right breast in female, estrogen receptor positive (HCC) 06/21/2021   Status post repeat low transverse cesarean section 03/03/2018   DM (diabetes mellitus) in pregnancy 03/02/2018   Incompetent cervix in pregnancy 03/02/2018    PCP: Dr. Adrian Prince, MD  REFERRING PROVIDER: [name], MD  REFERRING DIAG: S/p Right Mastectomy  THERAPY DIAG:  Stiffness of right shoulder, not elsewhere classified  Abnormal posture  ONSET DATE: April 22,2024  Rationale  for Evaluation and Treatment: Rehabilitation  SUBJECTIVE:                                                                                                                                                                                           SUBJECTIVE STATEMENT:  My neck continues to be much better.  I will still awaken with Rt upper trap pain - I'm sleeping on my sides.  I am doing most of the HEP.  I still have limited Rt shoulder motion.  I got shoulder pulleys but not sure they are helping.  PERTINENT HISTORY:  Pt is s/p Right Mastectomy with Tissue Expander and 4+/7 LN on 07/25/21. She is s/p 6 months of chemo followed by radiation. She had emergency surgery to remove tissue expander due to infection on 12/04/2021. She completed radiation on 04/04/22. She will be having right breast reconstruction on 06/24/2023.  PAIN:  Are you having pain? Yes, chronic  4/10 in Rt shoulder especially when I do the IR stretch with the pulleys  PRECAUTIONS: Right lymphedema risk  WEIGHT BEARING RESTRICTIONS: No  FALLS:  Has patient fallen in last 6 months? No  LIVING ENVIRONMENT: Lives with: husband and 2 children Lives in: House/apartment Stairs: Yes; Internal: 14 steps; can reach both and External: 0 steps; none   OCCUPATION: Administrative Asst, works from home  LEISURE: walking, work out with trainer,  HAND DOMINANCE: right   PRIOR LEVEL OF FUNCTION: Independent  PATIENT GOALS: Get my mobility back, decrease pain   OBJECTIVE:  COGNITION: Overall cognitive status: Within functional limits for tasks assessed   PALPATION:  7/18: 1/6 GH joint mobility with inferior, posterior, anterior glides  Very tender right UT, supraspinatus  OBSERVATIONS / OTHER ASSESSMENTS:  7/18:   Eval: Right UT compensation, decreased right scapular mobility with shoulder ROM  SENSATION: Light touch: Appears intact    POSTURE: forward head, rounded shoulders, trunk rotation  UPPER EXTREMITY  AROM/PROM:  A/PROM RIGHT   eval  RIGHT 06/04/23 RIGHT 06/18/23 RIGHT 7/18  Shoulder extension 25 (pain ant. shoulder 32 35 (pain ant shoulder) 25  Shoulder flexion 132, UT compensation 125 improved mechanics 97 118  Shoulder abduction 123, tight UT/arm  118 with UT pain 85  Shoulder internal rotation    Functional IR to mid gluteal before compensation with UT to gain more ROM  Shoulder external rotation        (Blank rows = not tested)  A/PROM LEFT   eval  Shoulder extension 41  Shoulder flexion 153  Shoulder abduction 178  Shoulder internal rotation 72  Shoulder external rotation 95    (Blank rows = not tested)  CERVICAL AROM:WNL except tightness noted in bilateral UT  UPPER EXTREMITY STRENGTH:  Middle and lower trap Rt 3-/5  LYMPHEDEMA ASSESSMENTS:   SURGERY TYPE/DATE: 07/25/2021 Right Mastectomy with SLNB and tissue expander, Tissue expander removed 12/04/21  NUMBER OF LYMPH NODES REMOVED: 4/7  CHEMOTHERAPY: YES, 08/25/21-01/19/22  RADIATION:YES, 02/26/22-04/04/2022  HORMONE TREATMENT: YES  INFECTIONS: Yes with need for tissue expander to be removed   LYMPHEDEMA ASSESSMENTS:   LANDMARK RIGHT  eval  At axilla  32.4  15 cm proximal to olecranon process   10 cm proximal to olecranon process 25.6  Olecranon process 24.7  15 cm proximal to ulnar styloid process   10 cm proximal to ulnar styloid process 21.0  Just proximal to ulnar styloid process 15.3  Across hand at thumb web space 19.1  At base of 2nd digit 5.8  (Blank rows = not tested)  LANDMARK LEFT  eval  At axilla  29  15 cm proximal to olecranon process   10 cm proximal to olecranon process 24.2  Olecranon process 24.3  15 cm proximal to ulnar styloid process   10 cm proximal to ulnar styloid process 20.3  Just proximal to ulnar styloid process 14.6  Across hand at thumb web space 18.8  At base of 2nd digit 5.9  (Blank rows = not tested)    GAIT: WNL  L-DEX LYMPHEDEMA SCREENING: The patient  was assessed using the L-Dex machine today to produce a lymphedema index baseline score. The patient will be reassessed on a regular basis (typically every 3 months) to obtain new L-Dex scores. If the score is > 6.5 points away from his/her baseline score indicating onset of subclinical lymphedema, it will be recommended to wear a compression garment for 4 weeks, 12 hours per day and then be reassessed. If the score continues to be > 6.5 points from baseline at reassessment, we will initiate lymphedema treatment. Assessing in this manner has a 95% rate of preventing clinically significant lymphedema. 04/30/2023 SCORE TODAY -1.7  QUICK DASH SURVEY: 61%   TODAY'S TREATMENT:                                                                                                                                         DATE:  06/27/23: Objective measures Pulleys - flexion, scaption, abduction, extension, functional IR in standing Trigger Point Dry-Needling  Treatment instructions: Expect mild to moderate muscle soreness. S/S of pneumothorax if dry needled over a  lung field, and to seek immediate medical attention should they occur. Patient verbalized understanding of these instructions and education.  Patient Consent Given: Yes Education handout provided: Previously provided Muscles treated: Rt lat from anterior, pectorals Electrical stimulation performed: No Parameters: N/A Treatment response/outcome: no twitch or response today P/ROM with joint mobs at end range - tacking down pectorals and lat to get to joint mob level Review plan for extension and goal review   DATE:  06/24/23: Therapeutic Exercises Pulleys into IR standing at door for review as pt reports she has been doing these at home and wanted her technique assessed. She was feeling the beginning of Rt UT compensation and stopping at that point but did also need cuing to keep upper arm at side and not allow for shoulder abduction Manual  Therapy Trigger Point Dry-Needling  Treatment instructions: Expect mild to moderate muscle soreness. S/S of pneumothorax if dry needled over a lung field, and to seek immediate medical attention should they occur. Patient verbalized understanding of these instructions and education.  Patient Consent Given: Yes Education handout provided: Previously provided Muscles treated: Rt upper trap and infraspinatus, lat Electrical stimulation performed: No Parameters: N/A Treatment response/outcome: signif twitch and release Rt upper trap and infraspinatus, no response in lat  DN performed by [name] Lulamae Skorupski, PT  P/ROM in supine to Rt shoulder intermittently during STM into flexion, abd and D2 with scapular depression by therapist throughout, pt still tight and demonstrates muscle guarding which limits her end motions with her abd still being more limited due to frozen shoulder and pain. Continued with multiple VC's to relax during P/ROM though she was more aware of this during session today and self corrected at times STM with cocoa butter in Lt S/L to Rt medial scapular border and UT where trigger points palpable Scap Mobs in Lt S/L into protraction and retraction, still with very limited mobility  MFR to Rt medial upper arm where cording visible by puckering at the skin    06/18/23: Self Care Discussed current functional status since pt just recently returned from her trip. She reports her Rt UT feels tight again with palpable trigger points and is starting to feel a pinch around her shoulder blade with Rt shoulder flexion. She feels her cervical A/ROM is good but does feel her UT pulling at end motions. Also feels the cording still in her upper arm, a bit worse than when she was here last before her trip.  Manual therapy P/ROM in supine to Rt shoulder intermittently during STM into flexion, abd and D2 with scapular depression by therapist throughout, pt still tight and demonstrates muscle guarding  which limits her end motions with her abd still being more limited due to frozen shoulder. Continued with multiple VC's to relax during P/ROM  STM with cocoa butter in Lt S/L to Rt medial scapular border and UT where trigger points palpable Scap Mobs in Lt S/L into protraction and retraction, very limited mobility noted but this improved some after MFR to Rt medial upper arm where cording palpable    PATIENT EDUCATION:  Education details: [email or web address], Discussed POC, DN mobilizations, STM, PROM, strength, decrease UT compensation Person educated: Patient Education method: Explanation Education comprehension: verbalized understanding  HOME EXERCISE PROGRAM: Access Code: [email or web address] URL: [email or web address] Date: 05/28/2023 Prepared by: [name] Tracie Dore  Exercises - Seated Levator Scapulae Stretch  - 1 x daily - 7 x weekly - 1 sets - 2 reps - 20 hold - Seated Upper Trapezius Stretch  - 1  x daily - 7 x weekly - 1 sets - 2 reps - 20 hold - Shoulder Lat Pull Down with Resistance  - 1 x daily - 7 x weekly - 2 sets - 10 reps - Low Trap Setting at Wall  - 1 x daily - 7 x weekly - 3 sets - 5 reps - Shoulder External Rotation with Anchored Resistance  - 1 x daily - 7 x weekly - 2 sets - 10 reps - Standing Shoulder Internal Rotation with Anchored Resistance  - 1 x daily - 7 x weekly - 2 sets - 10 reps  ASSESSMENT:  CLINICAL IMPRESSION: Pt continues to have significant Rt shoulder ROM restrictions consistent with frozen shoulder, compounded by soft tissue restrictions following breast cancer and surgery.  She is working with pulleys and HEP at home and continues to struggle with recurrent Rt upper trap pain and trigger points secondary to compensation patterns.  Neck ROM has normalized.  Functionally Pt is limited in reaching, dressing, and positioning of Rt arm for [place] dancing. PT advanced end range joint mobilizations today to tolerance and was able to tack pectorals and lats for  deeper grade mobilization at end range.  Pt was able to put Rt hand on hip comfortably end of session and demo'd improved abduction and IR ROM.  She will benefit from continued PT 2x/week x 8 more weeks to maximize function of Rt dominant UE.  OBJECTIVE IMPAIRMENTS: decreased knowledge of condition, decreased ROM, decreased strength, increased fascial restrictions, impaired flexibility, impaired UE functional use, postural dysfunction, and pain.   ACTIVITY LIMITATIONS: lifting, sleeping, and reach over head  PARTICIPATION LIMITATIONS:  [place] dancing, working out  PERSONAL FACTORS: 1-2 comorbidities: Right breast cancer s/p chemotherapy and radiation  are also affecting patient's functional outcome.   REHAB POTENTIAL: Good  CLINICAL DECISION MAKING: Stable/uncomplicated  EVALUATION COMPLEXITY: Low  GOALS: Goals reviewed with patient? Yes  SHORT TERM GOALS: Target date: 05/28/2023  Independent with HEP for Right shoulder ROM, strength/stabilization Baseline: Goal status:ONGOING  2.  Decreased pain in Right UT/pecs/ medial arm by 25 % Baseline:  Goal status: MET 6/25  3.  Shoulder flexion and abd improved by 10-12 degrees for improved reaching Baseline:  Goal status: MET Improved shoulder flexion and abd by 15-20 degrees  4.  Improved right scapulo -humeral rhythm and with decreased UT compensation Baseline:  Goal status: ONGOING   LONG TERM GOALS: Target date: 08/22/23  Right UT/pec/arm pain improved by atleast 50%  Baseline: 06/18/23 - 40% improvement at this time ; Goal status: PROGRESSING  2.  Quick dash no greater than 20%for improved function Baseline:  Goal status: INITIAL  3.   Able to perform [place] dancing with correct arm position without increased pain/compensation Baseline:  Goal status: ongoing   4.    Improved right scapulo -humeral rhythm and with decreased UT compensation Baseline: 06/18/23 - pt feels scapulo - humeral rhythm improved about 30% with Rt  shoulder A/ROM Goal status: ONGOING   5.  Pt will have improved ability to sleep on the right side without increased pain. Baseline:  Goal status: ongoing still awakens with pain  PLAN:  PT FREQUENCY: 2x/week  PT DURATION: 8 weeks  PLANNED INTERVENTIONS: Therapeutic exercises, Therapeutic activity, Neuromuscular re-education, Patient/Family education, Self Care, Joint mobilization, Dry Needling, Manual lymph drainage, scar mobilization, Ionotophoresis 4mg /ml Dexamethasone, Manual therapy, and Re-evaluation  PLAN FOR NEXT SESSION: PT to renew next; review HEP progression, progress scapular training and mechanics as tol, Cont UT/levator stretches,  cont DN to UT/pecs and other areas prn, STM prn, scapular and GH mobs, PROM, lower trap strengthening to decrease compensation, functional training  Morton Peters, PT 06/27/23 12:49 PM

## 2023-07-01 ENCOUNTER — Telehealth: Payer: Self-pay

## 2023-07-01 NOTE — Telephone Encounter (Signed)
Returned Pt's call and LVM regarding lab work. Pt stating she is still having significant fatigue and would like lab work drawn on 07/09/23 after injection appt that would address her fatigue. LVM stating that NP will call next day at 0830 to discuss symptoms further before orders are placed. Gave call back number with any questions.

## 2023-07-02 ENCOUNTER — Encounter: Payer: Self-pay | Admitting: Adult Health

## 2023-07-02 ENCOUNTER — Other Ambulatory Visit: Payer: Self-pay | Admitting: *Deleted

## 2023-07-02 ENCOUNTER — Inpatient Hospital Stay: Payer: 59 | Attending: Hematology and Oncology | Admitting: Adult Health

## 2023-07-02 ENCOUNTER — Inpatient Hospital Stay: Payer: 59

## 2023-07-02 DIAGNOSIS — C50811 Malignant neoplasm of overlapping sites of right female breast: Secondary | ICD-10-CM | POA: Diagnosis not present

## 2023-07-02 DIAGNOSIS — M25511 Pain in right shoulder: Secondary | ICD-10-CM | POA: Insufficient documentation

## 2023-07-02 DIAGNOSIS — R399 Unspecified symptoms and signs involving the genitourinary system: Secondary | ICD-10-CM

## 2023-07-02 DIAGNOSIS — Z9221 Personal history of antineoplastic chemotherapy: Secondary | ICD-10-CM | POA: Insufficient documentation

## 2023-07-02 DIAGNOSIS — R5382 Chronic fatigue, unspecified: Secondary | ICD-10-CM

## 2023-07-02 DIAGNOSIS — Z9011 Acquired absence of right breast and nipple: Secondary | ICD-10-CM | POA: Insufficient documentation

## 2023-07-02 DIAGNOSIS — R718 Other abnormality of red blood cells: Secondary | ICD-10-CM | POA: Diagnosis not present

## 2023-07-02 DIAGNOSIS — N390 Urinary tract infection, site not specified: Secondary | ICD-10-CM | POA: Insufficient documentation

## 2023-07-02 DIAGNOSIS — E538 Deficiency of other specified B group vitamins: Secondary | ICD-10-CM | POA: Diagnosis not present

## 2023-07-02 DIAGNOSIS — Z170 Estrogen receptor positive status [ER+]: Secondary | ICD-10-CM | POA: Insufficient documentation

## 2023-07-02 DIAGNOSIS — R5383 Other fatigue: Secondary | ICD-10-CM | POA: Insufficient documentation

## 2023-07-02 DIAGNOSIS — Z79818 Long term (current) use of other agents affecting estrogen receptors and estrogen levels: Secondary | ICD-10-CM | POA: Insufficient documentation

## 2023-07-02 DIAGNOSIS — Z923 Personal history of irradiation: Secondary | ICD-10-CM | POA: Insufficient documentation

## 2023-07-02 DIAGNOSIS — E559 Vitamin D deficiency, unspecified: Secondary | ICD-10-CM

## 2023-07-02 NOTE — Progress Notes (Signed)
[facility] Cancer Follow up:    Allison Croissant, MD [address] [place] [address]   DIAGNOSIS:  Cancer Staging  Malignant neoplasm of overlapping sites of right breast in female, estrogen receptor positive (HCC) Staging form: Breast, AJCC 8th Edition - Clinical stage from 06/21/2021: Stage IIA (cT3, cN1, cM0, G2, ER+, PR+, HER2-) - Signed by Allison Croissant, MD on 06/21/2021 Histologic grading system: 3 grade system - Pathologic: Stage IB (pT1c, pN2a(sn), cM0, G2, ER+, PR+, HER2-) - Signed by Allison Croissant, MD on 09/11/2022 Method of lymph node assessment: Sentinel lymph node biopsy Histologic grading system: 3 grade system  I connected with Allison Reed on 07/02/23 at  8:30 AM EDT by telephone and verified that I am speaking with the correct person using two identifiers.  I discussed the limitations, risks, security and privacy concerns of performing an evaluation and management service by telephone and the availability of in person appointments.  I also discussed with the patient that there may be a patient responsible charge related to this service. The patient expressed understanding and agreed to proceed.   SUMMARY OF ONCOLOGIC HISTORY: Oncology History  Malignant neoplasm of overlapping sites of right breast in female, estrogen receptor positive (HCC)  06/16/2021 Initial Diagnosis   Palpable right breast mass: Breast MRI revealed extensive involvement of the right breast with mass and non-mass enhancement superior right breast mass measures 4.9 x 1.2 cm and together with non-mass enhancement measured 8.8 cm, enhancing mass LOQ 1.3 cm, left breast indeterminate 0.9 cm mass and a 0.8 cm mass UOQ, single abnormal lymph node Biopsy 10:00, 11:00 and 12:00: IDC with DCIS, biopsy right axillary lymph node: IDC, ER 75 to 95%, PR 90 to 95%, Ki-67 25%, HER2 negative on 1 biopsy and 2+ by IHC and FISH pending   06/21/2021 Cancer Staging   Staging form: Breast, AJCC  8th Edition - Clinical stage from 06/21/2021: Stage IIA (cT3, cN1, cM0, G2, ER+, PR+, HER2-) - Signed by Allison Croissant, MD on 06/21/2021 Histologic grading system: 3 grade system   07/01/2021 Genetic Testing   Negative genetic testing:  No pathogenic variants detected on the [employer] BRCAplus panel (report date 07/01/2021) or the [employer] CancerNext-Expanded + RNAinsight panel (report date 07/01/2021).   The BRCAplus panel offered by [employer] and includes sequencing and deletion/duplication analysis for the following 8 genes: ATM, BRCA1, BRCA2, CDH1, CHEK2, PALB2, PTEN, and TP53. The CancerNext-Expanded + RNAinsight gene panel offered by [employer] and includes sequencing and rearrangement analysis for the following 77 genes: AIP, ALK, APC, ATM, AXIN2, BAP1, BARD1, BLM, BMPR1A, BRCA1, BRCA2, BRIP1, CDC73, CDH1, CDK4, CDKN1B, CDKN2A, CHEK2, CTNNA1, DICER1, FANCC, FH, FLCN, GALNT12, KIF1B, LZTR1, MAX, MEN1, MET, MLH1, MSH2, MSH3, MSH6, MUTYH, NBN, NF1, NF2, NTHL1, PALB2, PHOX2B, PMS2, POT1, PRKAR1A, PTCH1, PTEN, RAD51C, RAD51D, RB1, RECQL, RET, SDHA, SDHAF2, SDHB, SDHC, SDHD, SMAD4, SMARCA4, SMARCB1, SMARCE1, STK11, SUFU, TMEM127, TP53, TSC1, TSC2, VHL and XRCC2 (sequencing and deletion/duplication); EGFR, EGLN1, HOXB13, KIT, MITF, PDGFRA, POLD1 and POLE (sequencing only); EPCAM and GREM1 (deletion/duplication only). RNA data is routinely analyzed for use in variant interpretation for all genes.   07/25/2021 Surgery   Right mastectomy: Foci of invasive ductal carcinoma measuring 1.1 cm grade 2 with extensive DCIS spanning 8.9 cm, margins negative, 4/7 lymph nodes, ER 75 to 95%, PR 9095%, HER2 negative, Ki-67 25%   07/31/2021 Cancer Staging   Staging form: Breast, AJCC 8th Edition - Pathologic: Stage IB (pT1c, pN2a(sn), cM0, G2, ER+, PR+, HER2-) -  Signed by Allison Croissant, MD on 09/11/2022 Method of lymph node assessment: Sentinel lymph node biopsy Histologic grading system: 3 grade system   08/25/2021 -  01/19/2022 Adjuvant Chemotherapy   Adjuvant chemotherapy: dose dense AC x 4 followed by weekly Taxol x 12.    02/26/2022 - 04/04/2022 Radiation Therapy   Site Technique Total Dose (Gy) Dose per Fx (Gy) Completed Fx Beam Energies  Chest Wall, Right: CW_R_IMN 3D 50.4/50.4 1.8 28/28 6X  Chest Wall, Right: CW_R_PAB_SCV 3D 50.4/50.4 1.8 28/28 6X, 10X     03/14/2022 -  Anti-estrogen oral therapy   Zoladex.  Letrozole started 07/2022.       CURRENT THERAPY: Zoladex and Letrozole  INTERVAL HISTORY: Allison Reed 47 y.o. female returns for concerns around fatigue.  She describes it as exhaustion worse in the afternoon by 2-3 pm.  She notes this has been progressively worsening over the past month.    She is taking Vitamin D3 daily and she is taking b12 .  She had her hemoglobin A1c was tested 4 weeks ago and was 8.    She is also concerned about the possibility of cancer recurrence.  This is new and related to her slowly increasing fatigue along with her persistent shoulder pain and lymphedema.     Patient Active Problem List   Diagnosis Date Noted   Lower leg mass, left 05/02/2022   Port-A-Cath in place 08/25/2021   S/P mastectomy, right 07/25/2021   Genetic testing 07/01/2021   Family history of skin cancer 06/22/2021   Family history of brain tumor 06/22/2021   Malignant neoplasm of overlapping sites of right breast in female, estrogen receptor positive (HCC) 06/21/2021   Status post repeat low transverse cesarean section 03/03/2018   DM (diabetes mellitus) in pregnancy 03/02/2018   Incompetent cervix in pregnancy 03/02/2018    is allergic to betadine [povidone iodine] and tape.  MEDICAL HISTORY: Past Medical History:  Diagnosis Date   Diabetes mellitus without complication (HCC)    Type 2   Family history of brain tumor    Family history of skin cancer    Gestational diabetes    Headache    otc med prn   Heartburn in pregnancy    Incompetent cervix in pregnancy  04/2015   Missed abortion    no surgery required   Personal history of chemotherapy    Personal history of radiation therapy    Postpartum care following cesarean delivery (11/2) 10/12/2015   right breast ca 06/2021   UTI in pregnancy 03/2015   recent UTI, treatment completed    SURGICAL HISTORY: Past Surgical History:  Procedure Laterality Date   APPENDECTOMY  12/10/2004   BREAST BIOPSY Right 2022   BREAST BIOPSY Left 2022   BREAST RECONSTRUCTION WITH PLACEMENT OF TISSUE EXPANDER AND ALLODERM Right 07/25/2021   Procedure: RIGHT BREAST RECONSTRUCTION WITH PLACEMENT OF TISSUE EXPANDER AND ALLODERM;  Surgeon: Glenna Fellows, MD;  Location: [facility];  Service: Plastics;  Laterality: Right;   CERCLAGE LAPAROSCOPIC ABDOMINAL N/A 04/15/2015   Procedure: LAPAROSCOPIC TRANSABDOMINAL CERVCOISTHMIC CERCLAGE;  Surgeon: Fermin Schwab, MD;  Location: [facility];  Service: Gynecology;  Laterality: N/A;   CERVICAL CERCLAGE  02/08/2011   vaginal   CESAREAN SECTION N/A 10/12/2015   Procedure: CESAREAN SECTION;  Surgeon: Genia Del, MD;  Location: [facility];  Service: Obstetrics;  Laterality: N/A;   CESAREAN SECTION N/A 03/03/2018   Procedure: Repeat CESAREAN SECTION/Removal of Abdominal Cerclage;  Surgeon: Shea Evans, MD;  Location[facility];  Service: Obstetrics;  Laterality: N/A;  EDD: 03/28/18   MASTECTOMY Right 2022   MASTECTOMY W/ SENTINEL NODE BIOPSY Right 07/25/2021   Procedure: RIGHT MASTECTOMY WITH AXILLARY SENTINEL LYMPH NODE BIOPSY;  Surgeon: Emelia Loron, MD;  Location: [facility];  Service: General;  Laterality: Right;   PORTACATH PLACEMENT N/A 08/24/2021   Procedure: INSERTION PORT-A-CATH;  Surgeon: Emelia Loron, MD;  Location: [facility];  Service: General;  Laterality: N/A;   RADIOACTIVE SEED GUIDED AXILLARY SENTINEL LYMPH NODE Right 07/25/2021   Procedure: RADIOACTIVE SEED GUIDED RIGHT AXILLARY NODE  EXCISION;  Surgeon: Emelia Loron, MD;  Location: [facility];  Service: General;  Laterality: Right;   TISSUE EXPANDER PLACEMENT Right 12/04/2021   Procedure: REMOVAL RIGHT CHEST TISSUE EXPANDER;  Surgeon: Glenna Fellows, MD;  Location: [facility];  Service: Plastics;  Laterality: Right;   WISDOM TOOTH EXTRACTION  12/11/1999    SOCIAL HISTORY: Social History   Socioeconomic History   Marital status: Married    Spouse name: Dr Cyril Mourning   Number of children: Not on file   Years of education: Not on file   Highest education level: Not on file  Occupational History   Not on file  Tobacco Use   Smoking status: Never   Smokeless tobacco: Never  Vaping Use   Vaping status: Never Used  Substance and Sexual Activity   Alcohol use: Yes    Comment: social   Drug use: No   Sexual activity: Yes    Birth control/protection: None  Other Topics Concern   Not on file  Social History Narrative   Not on file   Social Determinants of Health   Financial Resource Strain: Not on file  Food Insecurity: Not on file  Transportation Needs: Not on file  Physical Activity: Not on file  Stress: Not on file  Social Connections: Not on file  Intimate Partner Violence: Not on file    FAMILY HISTORY: Family History  Problem Relation Age of Onset   Diabetes Father    HIV Maternal Uncle    Skin cancer Maternal Uncle 39   HIV Maternal Uncle    Diabetes Maternal Grandmother    Hypertension Maternal Grandmother    Alzheimer's disease Maternal Grandmother    Cirrhosis Maternal Grandfather    Alcoholism Maternal Grandfather    Other Paternal Grandfather        brain hemorrhage    Review of Systems  Constitutional:  Negative for appetite change, chills, fatigue, fever and unexpected weight change.  HENT:   Negative for hearing loss, lump/mass and trouble swallowing.   Eyes:  Negative for eye problems and icterus.  Respiratory:  Negative for chest tightness, cough and  shortness of breath.   Cardiovascular:  Negative for chest pain, leg swelling and palpitations.  Gastrointestinal:  Negative for abdominal distention, abdominal pain, constipation, diarrhea, nausea and vomiting.  Endocrine: Negative for hot flashes.  Genitourinary:  Negative for difficulty urinating.   Musculoskeletal:  Negative for arthralgias.  Skin:  Negative for itching and rash.  Neurological:  Negative for dizziness, extremity weakness, headaches and numbness.  Hematological:  Negative for adenopathy. Does not bruise/bleed easily.  Psychiatric/Behavioral:  Negative for depression. The patient is not nervous/anxious.       PHYSICAL EXAMINATION  Patient sounds well, no apparent distress.  Mood and behavior normal.     ASSESSMENT and THERAPY PLAN:   Malignant neoplasm of overlapping sites of right breast in female, estrogen receptor positive (  HCC) 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, margins negative, 4/7 lymph nodes, ER 75 to 95%, PR 9095%, HER2 negative, Ki-67 25% Genetics: Negative MammaPrint: Low risk (this is not a valid test for 4+ lymph nodes) Repeat prognostic panel on the lymph node in the final pathology also came back as ER/PR positive   Treatment plan: 1.  Adjuvant chemotherapy with dose dense Adriamycin and Cytoxan x4 followed by Taxol weekly x12 started 08/25/2021-01/19/2022 2. adjuvant radiation therapy 02/27/2022-04/04/2022 3.  Followed by adjuvant antiestrogen therapy with complete estrogen blockade and abemaciclib (discussed oophorectomy versus Zoladex plus letrozole plus abemaciclib) ------------------------------------------------------------------------------------------------------------------------ Recommendation:  Zoladex injections with letrozole started June 2023 Verzenio adjuvant therapy started 12/13/2022--stopped early due to toxicities Monthly for Zoladex  injections  Breast cancer surveillance: left breast mammogram 09/28/2022: Benign breast density category B  She continues on Letrozole and Zoladex but is having increasing fatigue and shoulder pain.  Fatigue: will evaluate labs due to h/o vitamin b12, vitamin d deficiency, microcytosis.  Lab orders have been placed.  She will also have signatera testing completed. Shoulder pain/frozen shoulder: she is working with PT, and will continue this.  I will obtain an xray to evaluate the bone integrity and structure.   Recent UTI: will repeat urinalysis and culture when she comes in for labs to ensure resolution of infection.  RTC based on above testing.  For now she will continue to receive Zoladex every 4 weeks and Letrozole daily.    Follow up instructions:    -Return to cancer center next week for labs, injection, to call if she wants testing sooner and we will arrange  -Mammogram due in 09/2023   The patient was provided an opportunity to ask questions and all were answered. The patient agreed with the plan and demonstrated an understanding of the instructions.   The patient was advised to call back or seek an in-person evaluation if the symptoms worsen or if the condition fails to improve as anticipated.   I provided 25 minutes of non face-to-face telephone visit time during this encounter, and > 50% was spent counseling as documented under my assessment & plan.  Lillard Anes, NP 07/02/23 9:17 AM Medical Oncology and Hematology [facility] [address] [place], [place] [address] Tel. [phone]    Fax. [phone]

## 2023-07-02 NOTE — Assessment & Plan Note (Signed)
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, margins negative, 4/7 lymph nodes, ER 75 to 95%, PR 9095%, HER2 negative, Ki-67 25% Genetics: Negative MammaPrint: Low risk (this is not a valid test for 4+ lymph nodes) Repeat prognostic panel on the lymph node in the final pathology also came back as ER/PR positive   Treatment plan: 1.  Adjuvant chemotherapy with dose dense Adriamycin and Cytoxan x4 followed by Taxol weekly x12 started 08/25/2021-01/19/2022 2. adjuvant radiation therapy 02/27/2022-04/04/2022 3.  Followed by adjuvant antiestrogen therapy with complete estrogen blockade and abemaciclib (discussed oophorectomy versus Zoladex plus letrozole plus abemaciclib) ------------------------------------------------------------------------------------------------------------------------ Recommendation:  Zoladex injections with letrozole started June 2023 Verzenio adjuvant therapy started 12/13/2022--stopped early due to toxicities Monthly for Zoladex injections  Breast cancer surveillance: left breast mammogram 09/28/2022: Benign breast density category B  She continues on Letrozole and Zoladex but is having increasing fatigue and shoulder pain.  Fatigue: will evaluate labs due to h/o vitamin b12, vitamin d deficiency, microcytosis.  Lab orders have been placed.  She will also have signatera testing completed. Shoulder pain/frozen shoulder: she is working with PT, and will continue this.  I will obtain an xray to evaluate the bone integrity and structure.   Recent UTI: will repeat urinalysis and culture when she comes in for labs to ensure resolution of infection.  RTC based on above testing.  For now she will continue to receive Zoladex every 4 weeks and Letrozole daily.

## 2023-07-03 ENCOUNTER — Telehealth: Payer: Self-pay | Admitting: Adult Health

## 2023-07-03 NOTE — Telephone Encounter (Signed)
Called patient about the changes made to her upcoming appointment. She is aware of the changes and agrees.

## 2023-07-08 ENCOUNTER — Other Ambulatory Visit: Payer: 59

## 2023-07-09 ENCOUNTER — Ambulatory Visit (HOSPITAL_COMMUNITY)
Admission: RE | Admit: 2023-07-09 | Discharge: 2023-07-09 | Disposition: A | Discharge status: Home / Self Care | Payer: 59 | Source: Ambulatory Visit | Attending: Adult Health | Admitting: Adult Health

## 2023-07-09 ENCOUNTER — Inpatient Hospital Stay: Payer: 59

## 2023-07-09 ENCOUNTER — Other Ambulatory Visit: Payer: Self-pay

## 2023-07-09 VITALS — BP 99/80 | HR 97 | Resp 18

## 2023-07-09 DIAGNOSIS — C50811 Malignant neoplasm of overlapping sites of right female breast: Secondary | ICD-10-CM

## 2023-07-09 DIAGNOSIS — R399 Unspecified symptoms and signs involving the genitourinary system: Secondary | ICD-10-CM

## 2023-07-09 DIAGNOSIS — Z9221 Personal history of antineoplastic chemotherapy: Secondary | ICD-10-CM | POA: Diagnosis not present

## 2023-07-09 DIAGNOSIS — N390 Urinary tract infection, site not specified: Secondary | ICD-10-CM | POA: Diagnosis not present

## 2023-07-09 DIAGNOSIS — R718 Other abnormality of red blood cells: Secondary | ICD-10-CM

## 2023-07-09 DIAGNOSIS — Z170 Estrogen receptor positive status [ER+]: Secondary | ICD-10-CM | POA: Insufficient documentation

## 2023-07-09 DIAGNOSIS — E559 Vitamin D deficiency, unspecified: Secondary | ICD-10-CM

## 2023-07-09 DIAGNOSIS — E538 Deficiency of other specified B group vitamins: Secondary | ICD-10-CM

## 2023-07-09 DIAGNOSIS — Z923 Personal history of irradiation: Secondary | ICD-10-CM | POA: Diagnosis not present

## 2023-07-09 DIAGNOSIS — Z9011 Acquired absence of right breast and nipple: Secondary | ICD-10-CM | POA: Diagnosis not present

## 2023-07-09 DIAGNOSIS — M25511 Pain in right shoulder: Secondary | ICD-10-CM | POA: Insufficient documentation

## 2023-07-09 DIAGNOSIS — Z79818 Long term (current) use of other agents affecting estrogen receptors and estrogen levels: Secondary | ICD-10-CM | POA: Diagnosis present

## 2023-07-09 DIAGNOSIS — R5383 Other fatigue: Secondary | ICD-10-CM | POA: Diagnosis not present

## 2023-07-09 DIAGNOSIS — R5382 Chronic fatigue, unspecified: Secondary | ICD-10-CM

## 2023-07-09 LAB — URINALYSIS, COMPLETE (UACMP) WITH MICROSCOPIC
Bilirubin Urine: NEGATIVE
Glucose, UA: >=500 mg/dL — AB
Hgb urine dipstick: NEGATIVE
Ketones, ur: 5 mg/dL — AB
Leukocytes,Ua: NEGATIVE
Nitrite: NEGATIVE
Protein, ur: NEGATIVE mg/dL
Specific Gravity, Urine: 1.005 (ref 1.005–1.030)
pH: 7 (ref 5.0–8.0)

## 2023-07-09 LAB — TSH: TSH: 2.876 u[IU]/mL (ref 0.350–4.500)

## 2023-07-09 LAB — CBC WITH DIFFERENTIAL (CANCER CENTER ONLY)
Abs Immature Granulocytes: 0.02 10*3/uL (ref 0.00–0.07)
Basophils Absolute: 0 10*3/uL (ref 0.0–0.1)
Basophils Relative: 1 %
Eosinophils Absolute: 0.1 10*3/uL (ref 0.0–0.5)
Eosinophils Relative: 2 %
HCT: 46.1 % — ABNORMAL HIGH (ref 36.0–46.0)
Hemoglobin: 15.9 g/dL — ABNORMAL HIGH (ref 12.0–15.0)
Immature Granulocytes: 0 %
Lymphocytes Relative: 26 %
Lymphs Abs: 1.7 10*3/uL (ref 0.7–4.0)
MCH: 26.5 pg (ref 26.0–34.0)
MCHC: 34.5 g/dL (ref 30.0–36.0)
MCV: 76.7 fL — ABNORMAL LOW (ref 80.0–100.0)
Monocytes Absolute: 0.4 10*3/uL (ref 0.1–1.0)
Monocytes Relative: 6 %
Neutro Abs: 4.3 10*3/uL (ref 1.7–7.7)
Neutrophils Relative %: 65 %
Platelet Count: 216 10*3/uL (ref 150–400)
RBC: 6.01 MIL/uL — ABNORMAL HIGH (ref 3.87–5.11)
RDW: 13.6 % (ref 11.5–15.5)
WBC Count: 6.6 10*3/uL (ref 4.0–10.5)
nRBC: 0 % (ref 0.0–0.2)

## 2023-07-09 LAB — VITAMIN D 25 HYDROXY (VIT D DEFICIENCY, FRACTURES): Vit D, 25-Hydroxy: 40.17 ng/mL (ref 30–100)

## 2023-07-09 LAB — CMP (CANCER CENTER ONLY)
ALT: 29 U/L (ref 0–44)
AST: 24 U/L (ref 15–41)
Albumin: 5.1 g/dL — ABNORMAL HIGH (ref 3.5–5.0)
Alkaline Phosphatase: 68 U/L (ref 38–126)
Anion gap: 13 (ref 5–15)
BUN: 11 mg/dL (ref 6–20)
CO2: 24 mmol/L (ref 22–32)
Calcium: 10.8 mg/dL — ABNORMAL HIGH (ref 8.9–10.3)
Chloride: 101 mmol/L (ref 98–111)
Creatinine: 0.6 mg/dL (ref 0.44–1.00)
GFR, Estimated: >60 mL/min (ref >60)
Glucose, Bld: 152 mg/dL — ABNORMAL HIGH (ref 70–99)
Potassium: 4.1 mmol/L (ref 3.5–5.1)
Sodium: 138 mmol/L (ref 135–145)
Total Bilirubin: 0.5 mg/dL (ref 0.3–1.2)
Total Protein: 7.8 g/dL (ref 6.5–8.1)

## 2023-07-09 LAB — GENETIC SCREENING ORDER

## 2023-07-09 LAB — VITAMIN B12: Vitamin B-12: 288 pg/mL (ref 180–914)

## 2023-07-09 LAB — IRON AND IRON BINDING CAPACITY (CC-WL,HP ONLY)
Iron: 100 ug/dL (ref 28–170)
Saturation Ratios: 22 % (ref 10.4–31.8)
TIBC: 458 ug/dL — ABNORMAL HIGH (ref 250–450)
UIBC: 358 ug/dL (ref 148–442)

## 2023-07-09 LAB — MAGNESIUM: Magnesium: 1.8 mg/dL (ref 1.7–2.4)

## 2023-07-09 LAB — FERRITIN: Ferritin: 93 ng/mL (ref 11–307)

## 2023-07-09 MED ORDER — GOSERELIN ACETATE 3.6 MG ~~LOC~~ IMPL
3.6000 mg | DRUG_IMPLANT | SUBCUTANEOUS | Status: DC
  Administered 2023-07-09: 3.6 mg via SUBCUTANEOUS
  Filled 2023-07-09: qty 3.6

## 2023-07-11 ENCOUNTER — Encounter: Payer: Self-pay | Admitting: Adult Health

## 2023-07-12 ENCOUNTER — Other Ambulatory Visit: Payer: Self-pay | Admitting: Adult Health

## 2023-07-12 ENCOUNTER — Other Ambulatory Visit (HOSPITAL_COMMUNITY): Payer: Self-pay

## 2023-07-12 ENCOUNTER — Telehealth: Payer: Self-pay | Admitting: *Deleted

## 2023-07-12 MED ORDER — CIPROFLOXACIN HCL 500 MG PO TABS
500.0000 mg | ORAL_TABLET | Freq: Two times a day (BID) | ORAL | 0 refills | Status: DC
Start: 2023-07-12 — End: 2023-07-12
  Filled 2023-07-12: qty 10, 5d supply, fill #0

## 2023-07-12 MED ORDER — CIPROFLOXACIN HCL 500 MG PO TABS: 500.0000 mg | ORAL_TABLET | Freq: Two times a day (BID) | ORAL | 0 refills | Status: AC

## 2023-07-12 NOTE — Telephone Encounter (Signed)
Notified of message below. States when she needs to urinate, she has to go immediately.  Notified that Lillard Anes, NP has sent  Cipro to [facility] to take twice a day for 5 days. Pt states RX needs to be sent to [facility] on [address]. Rx changed as requested.

## 2023-07-12 NOTE — Telephone Encounter (Signed)
-----   Message from Noreene Filbert sent at 07/12/2023 11:27 AM EDT ----- Labs and xray are all back.  No signs of cancer recurrenc on imaging.  I recommend she take oral b12 1000 mcg sublingual daily.  Is she having urinary symptoms? ----- Message ----- From: Leory Plowman, Lab In [place] Sent: 07/09/2023   1:03 PM EDT To: Loa Socks, NP

## 2023-07-15 ENCOUNTER — Ambulatory Visit: Payer: 59 | Attending: Hematology and Oncology

## 2023-07-15 DIAGNOSIS — R293 Abnormal posture: Secondary | ICD-10-CM | POA: Diagnosis present

## 2023-07-15 DIAGNOSIS — I890 Lymphedema, not elsewhere classified: Secondary | ICD-10-CM | POA: Diagnosis present

## 2023-07-15 DIAGNOSIS — C50811 Malignant neoplasm of overlapping sites of right female breast: Secondary | ICD-10-CM | POA: Insufficient documentation

## 2023-07-15 DIAGNOSIS — Z483 Aftercare following surgery for neoplasm: Secondary | ICD-10-CM | POA: Insufficient documentation

## 2023-07-15 DIAGNOSIS — Z170 Estrogen receptor positive status [ER+]: Secondary | ICD-10-CM | POA: Insufficient documentation

## 2023-07-15 DIAGNOSIS — M25611 Stiffness of right shoulder, not elsewhere classified: Secondary | ICD-10-CM | POA: Insufficient documentation

## 2023-07-15 NOTE — Therapy (Signed)
OUTPATIENT PHYSICAL THERAPY  UPPER EXTREMITY ONCOLOGY TREATMENT  Patient Name: CATALEAH BERON MRN: [id number] DOB:05-15-1976, 47 y.o., female Today's Date: 07/15/2023  END OF SESSION:  PT End of Session - 07/15/23 1210     Visit Number 14    Number of Visits 32    Date for PT Re-Evaluation 08/22/23    PT Start Time 1207    PT Stop Time 1259    PT Time Calculation (min) 52 min    Activity Tolerance Patient tolerated treatment well;Patient limited by pain    Behavior During Therapy [facility] for tasks assessed/performed                 Past Medical History:  Diagnosis Date   Diabetes mellitus without complication (HCC)    Type 2   Family history of brain tumor    Family history of skin cancer    Gestational diabetes    Headache    otc med prn   Heartburn in pregnancy    Incompetent cervix in pregnancy 04/2015   Missed abortion    no surgery required   Personal history of chemotherapy    Personal history of radiation therapy    Postpartum care following cesarean delivery (11/2) 10/12/2015   right breast ca 06/2021   UTI in pregnancy 03/2015   recent UTI, treatment completed   Past Surgical History:  Procedure Laterality Date   APPENDECTOMY  12/10/2004   BREAST BIOPSY Right 2022   BREAST BIOPSY Left 2022   BREAST RECONSTRUCTION WITH PLACEMENT OF TISSUE EXPANDER AND ALLODERM Right 07/25/2021   Procedure: RIGHT BREAST RECONSTRUCTION WITH PLACEMENT OF TISSUE EXPANDER AND ALLODERM;  Surgeon: Glenna Fellows, MD;  Location: [facility];  Service: Plastics;  Laterality: Right;   CERCLAGE LAPAROSCOPIC ABDOMINAL N/A 04/15/2015   Procedure: LAPAROSCOPIC TRANSABDOMINAL CERVCOISTHMIC CERCLAGE;  Surgeon: Fermin Schwab, MD;  Location: [facility];  Service: Gynecology;  Laterality: N/A;   CERVICAL CERCLAGE  02/08/2011   vaginal   CESAREAN SECTION N/A 10/12/2015   Procedure: CESAREAN SECTION;  Surgeon: Genia Del, MD;  Location: [facility];  Service: Obstetrics;   Laterality: N/A;   CESAREAN SECTION N/A 03/03/2018   Procedure: Repeat CESAREAN SECTION/Removal of Abdominal Cerclage;  Surgeon: Shea Evans, MD;  Location: [facility];  Service: Obstetrics;  Laterality: N/A;  EDD: 03/28/18   MASTECTOMY Right 2022   MASTECTOMY W/ SENTINEL NODE BIOPSY Right 07/25/2021   Procedure: RIGHT MASTECTOMY WITH AXILLARY SENTINEL LYMPH NODE BIOPSY;  Surgeon: Emelia Loron, MD;  Location: [facility];  Service: General;  Laterality: Right;   PORTACATH PLACEMENT N/A 08/24/2021   Procedure: INSERTION PORT-A-CATH;  Surgeon: Emelia Loron, MD;  Location: [facility];  Service: General;  Laterality: N/A;   RADIOACTIVE SEED GUIDED AXILLARY SENTINEL LYMPH NODE Right 07/25/2021   Procedure: RADIOACTIVE SEED GUIDED RIGHT AXILLARY NODE EXCISION;  Surgeon: Emelia Loron, MD;  Location: [facility];  Service: General;  Laterality: Right;   TISSUE EXPANDER PLACEMENT Right 12/04/2021   Procedure: REMOVAL RIGHT CHEST TISSUE EXPANDER;  Surgeon: Glenna Fellows, MD;  Location: [facility];  Service: Plastics;  Laterality: Right;   WISDOM TOOTH EXTRACTION  12/11/1999   Patient Active Problem List   Diagnosis Date Noted   Lower leg mass, left 05/02/2022   Port-A-Cath in place 08/25/2021   S/P mastectomy, right 07/25/2021   Genetic testing 07/01/2021   Family history of skin cancer 06/22/2021   Family history of brain tumor 06/22/2021   Malignant neoplasm of  overlapping sites of right breast in female, estrogen receptor positive (HCC) 06/21/2021   Status post repeat low transverse cesarean section 03/03/2018   DM (diabetes mellitus) in pregnancy 03/02/2018   Incompetent cervix in pregnancy 03/02/2018    PCP: Dr. Adrian Prince, MD  REFERRING PROVIDER: [name], MD  REFERRING DIAG: S/p Right Mastectomy  THERAPY DIAG:  Stiffness of right shoulder, not elsewhere classified  Abnormal posture  ONSET DATE: April  22,2024  Rationale for Evaluation and Treatment: Rehabilitation  SUBJECTIVE:                                                                                                                                                                                           SUBJECTIVE STATEMENT:  I felt a lot of release after Johanna dry needled me 2 appts ago but the tightness came back. 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   PERTINENT HISTORY:  Pt is s/p Right Mastectomy with Tissue Expander and 4+/7 LN on 07/25/21. She is s/p 6 months of chemo followed by radiation. She had emergency surgery to remove tissue expander due to infection on 12/04/2021. She completed radiation on 04/04/22. She will be having right breast reconstruction on 06/24/2023.  PAIN:  Are you having pain? Yes, chronic  4/10 in Rt shoulder especially when I do the IR stretch with the pulleys  PRECAUTIONS: Right lymphedema risk  WEIGHT BEARING RESTRICTIONS: No  FALLS:  Has patient fallen in last 6 months? No  LIVING ENVIRONMENT: Lives with: husband and 2 children Lives in: House/apartment Stairs: Yes; Internal: 14 steps; can reach both and External: 0 steps; none   OCCUPATION: Administrative Asst, works from home  LEISURE: walking, work out with trainer,  HAND DOMINANCE: right   PRIOR LEVEL OF FUNCTION: Independent  PATIENT GOALS: Get my mobility back, decrease pain   OBJECTIVE:  COGNITION: Overall cognitive status: Within functional limits for tasks assessed   PALPATION:  7/18: 1/6 GH joint mobility with inferior, posterior, anterior glides  Very tender right UT, supraspinatus  OBSERVATIONS / OTHER ASSESSMENTS:  7/18:   Eval: Right UT compensation, decreased right scapular mobility with shoulder ROM  SENSATION: Light touch: Appears intact    POSTURE: forward head, rounded shoulders, trunk rotation  UPPER EXTREMITY  AROM/PROM:  A/PROM RIGHT   eval  RIGHT 06/04/23 RIGHT 06/18/23 RIGHT 7/18  Shoulder extension 25 (pain ant. shoulder 32 35 (pain ant shoulder) 25  Shoulder flexion 132, UT compensation 125 improved mechanics 97 118  Shoulder abduction 123, tight UT/arm  118 with UT pain 85  Shoulder internal rotation  Functional IR to mid gluteal before compensation with UT to gain more ROM  Shoulder external rotation        (Blank rows = not tested)  A/PROM LEFT   eval  Shoulder extension 41  Shoulder flexion 153  Shoulder abduction 178  Shoulder internal rotation 72  Shoulder external rotation 95    (Blank rows = not tested)  CERVICAL AROM:WNL except tightness noted in bilateral UT  UPPER EXTREMITY STRENGTH:  Middle and lower trap Rt 3-/5  LYMPHEDEMA ASSESSMENTS:   SURGERY TYPE/DATE: 07/25/2021 Right Mastectomy with SLNB and tissue expander, Tissue expander removed 12/04/21  NUMBER OF LYMPH NODES REMOVED: 4/7  CHEMOTHERAPY: YES, 08/25/21-01/19/22  RADIATION:YES, 02/26/22-04/04/2022  HORMONE TREATMENT: YES  INFECTIONS: Yes with need for tissue expander to be removed   LYMPHEDEMA ASSESSMENTS:   LANDMARK RIGHT  eval  At axilla  32.4  15 cm proximal to olecranon process   10 cm proximal to olecranon process 25.6  Olecranon process 24.7  15 cm proximal to ulnar styloid process   10 cm proximal to ulnar styloid process 21.0  Just proximal to ulnar styloid process 15.3  Across hand at thumb web space 19.1  At base of 2nd digit 5.8  (Blank rows = not tested)  LANDMARK LEFT  eval  At axilla  29  15 cm proximal to olecranon process   10 cm proximal to olecranon process 24.2  Olecranon process 24.3  15 cm proximal to ulnar styloid process   10 cm proximal to ulnar styloid process 20.3  Just proximal to ulnar styloid process 14.6  Across hand at thumb web space 18.8  At base of 2nd digit 5.9  (Blank rows = not tested)    GAIT: WNL  L-DEX LYMPHEDEMA SCREENING: The patient  was assessed using the L-Dex machine today to produce a lymphedema index baseline score. The patient will be reassessed on a regular basis (typically every 3 months) to obtain new L-Dex scores. If the score is > 6.5 points away from his/her baseline score indicating onset of subclinical lymphedema, it will be recommended to wear a compression garment for 4 weeks, 12 hours per day and then be reassessed. If the score continues to be > 6.5 points from baseline at reassessment, we will initiate lymphedema treatment. Assessing in this manner has a 95% rate of preventing clinically significant lymphedema. 04/30/2023 SCORE TODAY -1.7  QUICK DASH SURVEY: 61%   TODAY'S TREATMENT:                                                                                                                                         DATE:  07/15/23: Manual Therapy Trigger Point Dry-Needling performed by Reather Laurence, PT, DPT Treatment instructions: Expect mild to moderate muscle soreness. S/S of pneumothorax if dry needled over a lung field, and to seek immediate medical attention should they occur. Patient verbalized understanding of these instructions  and education. Patient Consent Given: Yes Education handout provided: Previously provided Muscles treated: Right upper trap, right rhomboids/infraspinatus, right lat, right delts, right thoracic multifidi Electrical stimulation performed: No Parameters: N/A Treatment response/outcome:  Utilized skilled palpation and identification of bony landmarks throughout treatment.  Able to illicit twitch response and muscle elongation. Pt with reports of decreased pain. Treatment resumed by PTA STM with cocoa butter to Rt shoulder, UT and lateral trunk where pt palpably very tight but less so after DN P/ROM to Rt shoulder into flex and scaption with therapist attempting scapular depression during but scap mobility very limited due to tight musculature Joint Mobs with working to tack  down pectoral and lat; pt with much tenderness at Rt lat today  06/27/23: Objective measures Pulleys - flexion, scaption, abduction, extension, functional IR in standing Trigger Point Dry-Needling  Treatment instructions: Expect mild to moderate muscle soreness. S/S of pneumothorax if dry needled over a lung field, and to seek immediate medical attention should they occur. Patient verbalized understanding of these instructions and education.  Patient Consent Given: Yes Education handout provided: Previously provided Muscles treated: Rt lat from anterior, pectorals Electrical stimulation performed: No Parameters: N/A Treatment response/outcome: no twitch or response today P/ROM with joint mobs at end range - tacking down pectorals and lat to get to joint mob level Review plan for extension and goal review   DATE:  06/24/23: Therapeutic Exercises Pulleys into IR standing at door for review as pt reports she has been doing these at home and wanted her technique assessed. She was feeling the beginning of Rt UT compensation and stopping at that point but did also need cuing to keep upper arm at side and not allow for shoulder abduction Manual Therapy Trigger Point Dry-Needling  Treatment instructions: Expect mild to moderate muscle soreness. S/S of pneumothorax if dry needled over a lung field, and to seek immediate medical attention should they occur. Patient verbalized understanding of these instructions and education.  Patient Consent Given: Yes Education handout provided: Previously provided Muscles treated: Rt upper trap and infraspinatus, lat Electrical stimulation performed: No Parameters: N/A Treatment response/outcome: signif twitch and release Rt upper trap and infraspinatus, no response in lat  DN performed by [name] Beuhring, PT  P/ROM in supine to Rt shoulder intermittently during STM into flexion, abd and D2 with scapular depression by therapist throughout, pt still tight  and demonstrates muscle guarding which limits her end motions with her abd still being more limited due to frozen shoulder and pain. Continued with multiple VC's to relax during P/ROM though she was more aware of this during session today and self corrected at times STM with cocoa butter in Lt S/L to Rt medial scapular border and UT where trigger points palpable Scap Mobs in Lt S/L into protraction and retraction, still with very limited mobility  MFR to Rt medial upper arm where cording visible by puckering at the skin    06/18/23: Self Care Discussed current functional status since pt just recently returned from her trip. She reports her Rt UT feels tight again with palpable trigger points and is starting to feel a pinch around her shoulder blade with Rt shoulder flexion. She feels her cervical A/ROM is good but does feel her UT pulling at end motions. Also feels the cording still in her upper arm, a bit worse than when she was here last before her trip.  Manual therapy P/ROM in supine to Rt shoulder intermittently during STM into flexion, abd and D2 with  scapular depression by therapist throughout, pt still tight and demonstrates muscle guarding which limits her end motions with her abd still being more limited due to frozen shoulder. Continued with multiple VC's to relax during P/ROM  STM with cocoa butter in Lt S/L to Rt medial scapular border and UT where trigger points palpable Scap Mobs in Lt S/L into protraction and retraction, very limited mobility noted but this improved some after MFR to Rt medial upper arm where cording palpable    PATIENT EDUCATION:  Education details: [email or web address], Discussed POC, DN mobilizations, STM, PROM, strength, decrease UT compensation Person educated: Patient Education method: Explanation Education comprehension: verbalized understanding  HOME EXERCISE PROGRAM: Access Code: [email or web address] URL: [email or web address] Date: 05/28/2023 Prepared by:  [name] Beuhring  Exercises - Seated Levator Scapulae Stretch  - 1 x daily - 7 x weekly - 1 sets - 2 reps - 20 hold - Seated Upper Trapezius Stretch  - 1 x daily - 7 x weekly - 1 sets - 2 reps - 20 hold - Shoulder Lat Pull Down with Resistance  - 1 x daily - 7 x weekly - 2 sets - 10 reps - Low Trap Setting at Wall  - 1 x daily - 7 x weekly - 3 sets - 5 reps - Shoulder External Rotation with Anchored Resistance  - 1 x daily - 7 x weekly - 2 sets - 10 reps - Standing Shoulder Internal Rotation with Anchored Resistance  - 1 x daily - 7 x weekly - 2 sets - 10 reps  ASSESSMENT:  CLINICAL IMPRESSION: Pt comes after not being able to return to PT x2 weeks due to our schedules being full. She was also very stressed last week due to getting an xray and labs done as oncology nurse told her it would be good to rule out bone cancer mets from her pain which made pt extremely nervous and stressed out. All labs and xray came back clear except for ecoli infection in her bladder that she was given a shot for. Today Shaunde, PT was able to DN pt during session and then manual therapy was continued. Pt reports she was feeling much looser after session today and muscle weren't near as tight. Encouraged pt to speak with PT at next session about expectations for upcoming appts with frozen shoulder diagnosis. Cont for now or place pt on hold due to increased pain? Pt verbalized understanding and agreed.   OBJECTIVE IMPAIRMENTS: decreased knowledge of condition, decreased ROM, decreased strength, increased fascial restrictions, impaired flexibility, impaired UE functional use, postural dysfunction, and pain.   ACTIVITY LIMITATIONS: lifting, sleeping, and reach over head  PARTICIPATION LIMITATIONS:  [place] dancing, working out  PERSONAL FACTORS: 1-2 comorbidities: Right breast cancer s/p chemotherapy and radiation  are also affecting patient's functional outcome.   REHAB POTENTIAL: Good  CLINICAL DECISION MAKING:  Stable/uncomplicated  EVALUATION COMPLEXITY: Low  GOALS: Goals reviewed with patient? Yes  SHORT TERM GOALS: Target date: 05/28/2023  Independent with HEP for Right shoulder ROM, strength/stabilization Baseline: Goal status:ONGOING  2.  Decreased pain in Right UT/pecs/ medial arm by 25 % Baseline:  Goal status: MET 6/25  3.  Shoulder flexion and abd improved by 10-12 degrees for improved reaching Baseline:  Goal status: MET Improved shoulder flexion and abd by 15-20 degrees  4.  Improved right scapulo -humeral rhythm and with decreased UT compensation Baseline:  Goal status: ONGOING   LONG TERM GOALS: Target date: 08/22/23  Right UT/pec/arm pain improved by atleast 50%  Baseline:  06/18/23 - 40% improvement at this time ; Goal status: PROGRESSING  2.  Quick dash no greater than 20%for improved function Baseline:  Goal status: INITIAL  3.   Able to perform [place] dancing with correct arm position without increased pain/compensation Baseline:  Goal status: ongoing   4.    Improved right scapulo -humeral rhythm and with decreased UT compensation Baseline: 06/18/23 - pt feels scapulo - humeral rhythm improved about 30% with Rt shoulder A/ROM Goal status: ONGOING   5.  Pt will have improved ability to sleep on the right side without increased pain. Baseline:  Goal status: ongoing still awakens with pain  PLAN:  PT FREQUENCY: 2x/week  PT DURATION: 8 weeks  PLANNED INTERVENTIONS: Therapeutic exercises, Therapeutic activity, Neuromuscular re-education, Patient/Family education, Self Care, Joint mobilization, Dry Needling, Manual lymph drainage, scar mobilization, Ionotophoresis 4mg /ml Dexamethasone, Manual therapy, and Re-evaluation  PLAN FOR NEXT SESSION: PT to renew next; review HEP progression, progress scapular training and mechanics as tol, Cont UT/levator stretches, cont DN to UT/pecs and other areas prn, STM prn, scapular and GH mobs, PROM, lower trap strengthening  to decrease compensation, functional training  Berna Spare, PTA 07/15/23 1:10 PM

## 2023-07-18 ENCOUNTER — Encounter: Payer: Self-pay | Admitting: Physical Therapy

## 2023-07-18 ENCOUNTER — Ambulatory Visit: Payer: 59 | Admitting: Physical Therapy

## 2023-07-18 DIAGNOSIS — M25611 Stiffness of right shoulder, not elsewhere classified: Secondary | ICD-10-CM | POA: Diagnosis not present

## 2023-07-18 DIAGNOSIS — R293 Abnormal posture: Secondary | ICD-10-CM

## 2023-07-18 NOTE — Therapy (Signed)
OUTPATIENT PHYSICAL THERAPY  UPPER EXTREMITY ONCOLOGY TREATMENT  Patient Name: Allison Reed MRN: [id number] DOB:20-Jun-1976, 47 y.o., female Today's Date: 07/18/2023  END OF SESSION:  PT End of Session - 07/18/23 0934     Visit Number 15    Number of Visits 32    Date for PT Re-Evaluation 08/22/23    PT Start Time 0934    PT Stop Time 1015    PT Time Calculation (min) 41 min    Activity Tolerance Patient tolerated treatment well;Patient limited by pain    Behavior During Therapy [facility] for tasks assessed/performed                  Past Medical History:  Diagnosis Date   Diabetes mellitus without complication (HCC)    Type 2   Family history of brain tumor    Family history of skin cancer    Gestational diabetes    Headache    otc med prn   Heartburn in pregnancy    Incompetent cervix in pregnancy 04/2015   Missed abortion    no surgery required   Personal history of chemotherapy    Personal history of radiation therapy    Postpartum care following cesarean delivery (11/2) 10/12/2015   right breast ca 06/2021   UTI in pregnancy 03/2015   recent UTI, treatment completed   Past Surgical History:  Procedure Laterality Date   APPENDECTOMY  12/10/2004   BREAST BIOPSY Right 2022   BREAST BIOPSY Left 2022   BREAST RECONSTRUCTION WITH PLACEMENT OF TISSUE EXPANDER AND ALLODERM Right 07/25/2021   Procedure: RIGHT BREAST RECONSTRUCTION WITH PLACEMENT OF TISSUE EXPANDER AND ALLODERM;  Surgeon: Glenna Fellows, MD;  Location: [facility];  Service: Plastics;  Laterality: Right;   CERCLAGE LAPAROSCOPIC ABDOMINAL N/A 04/15/2015   Procedure: LAPAROSCOPIC TRANSABDOMINAL CERVCOISTHMIC CERCLAGE;  Surgeon: Fermin Schwab, MD;  Location: [facility];  Service: Gynecology;  Laterality: N/A;   CERVICAL CERCLAGE  02/08/2011   vaginal   CESAREAN SECTION N/A 10/12/2015   Procedure: CESAREAN SECTION;  Surgeon: Genia Del, MD;  Location: [facility];  Service: Obstetrics;   Laterality: N/A;   CESAREAN SECTION N/A 03/03/2018   Procedure: Repeat CESAREAN SECTION/Removal of Abdominal Cerclage;  Surgeon: Shea Evans, MD;  Location[facility];  Service: Obstetrics;  Laterality: N/A;  EDD: 03/28/18   MASTECTOMY Right 2022   MASTECTOMY W/ SENTINEL NODE BIOPSY Right 07/25/2021   Procedure: RIGHT MASTECTOMY WITH AXILLARY SENTINEL LYMPH NODE BIOPSY;  Surgeon: Emelia Loron, MD;  Location: [facility];  Service: General;  Laterality: Right;   PORTACATH PLACEMENT N/A 08/24/2021   Procedure: INSERTION PORT-A-CATH;  Surgeon: Emelia Loron, MD;  Location: [facility];  Service: General;  Laterality: N/A;   RADIOACTIVE SEED GUIDED AXILLARY SENTINEL LYMPH NODE Right 07/25/2021   Procedure: RADIOACTIVE SEED GUIDED RIGHT AXILLARY NODE EXCISION;  Surgeon: Emelia Loron, MD;  Location: [facility];  Service: General;  Laterality: Right;   TISSUE EXPANDER PLACEMENT Right 12/04/2021   Procedure: REMOVAL RIGHT CHEST TISSUE EXPANDER;  Surgeon: Glenna Fellows, MD;  Location: [facility];  Service: Plastics;  Laterality: Right;   WISDOM TOOTH EXTRACTION  12/11/1999   Patient Active Problem List   Diagnosis Date Noted   Lower leg mass, left 05/02/2022   Port-A-Cath in place 08/25/2021   S/P mastectomy, right 07/25/2021   Genetic testing 07/01/2021   Family history of skin cancer 06/22/2021   Family history of brain tumor 06/22/2021   Malignant neoplasm  of overlapping sites of right breast in female, estrogen receptor positive (HCC) 06/21/2021   Status post repeat low transverse cesarean section 03/03/2018   DM (diabetes mellitus) in pregnancy 03/02/2018   Incompetent cervix in pregnancy 03/02/2018    PCP: Dr. Adrian Prince, MD  REFERRING PROVIDER: [name], MD  REFERRING DIAG: S/p Right Mastectomy  THERAPY DIAG:  Stiffness of right shoulder, not elsewhere classified  Abnormal posture  ONSET DATE: April  22,2024  Rationale for Evaluation and Treatment: Rehabilitation  SUBJECTIVE:                                                                                                                                                                                           SUBJECTIVE STATEMENT:  The joint mobs helped a lot several sessions ago.  The muscles twitch with the DN but I don't get as much range improvement as I do with the joint mobilization.  PERTINENT HISTORY:  Pt is s/p Right Mastectomy with Tissue Expander and 4+/7 LN on 07/25/21. She is s/p 6 months of chemo followed by radiation. She had emergency surgery to remove tissue expander due to infection on 12/04/2021. She completed radiation on 04/04/22. She will be having right breast reconstruction on 06/24/2023.  PAIN:  Are you having pain? Yes, chronic  4/10 in Rt shoulder especially when I do the IR stretch with the pulleys  PRECAUTIONS: Right lymphedema risk  WEIGHT BEARING RESTRICTIONS: No  FALLS:  Has patient fallen in last 6 months? No  LIVING ENVIRONMENT: Lives with: husband and 2 children Lives in: House/apartment Stairs: Yes; Internal: 14 steps; can reach both and External: 0 steps; none   OCCUPATION: Administrative Asst, works from home  LEISURE: walking, work out with trainer,  HAND DOMINANCE: right   PRIOR LEVEL OF FUNCTION: Independent  PATIENT GOALS: Get my mobility back, decrease pain   OBJECTIVE:  COGNITION: Overall cognitive status: Within functional limits for tasks assessed   PALPATION:  7/18: 1/6 GH joint mobility with inferior, posterior, anterior glides  Very tender right UT, supraspinatus  OBSERVATIONS / OTHER ASSESSMENTS:  7/18:   Eval: Right UT compensation, decreased right scapular mobility with shoulder ROM  SENSATION: Light touch: Appears intact    POSTURE: forward head, rounded shoulders, trunk rotation  UPPER EXTREMITY AROM/PROM:  A/PROM RIGHT   eval  RIGHT 06/04/23  RIGHT 06/18/23 RIGHT 7/18  Shoulder extension 25 (pain ant. shoulder 32 35 (pain ant shoulder) 25  Shoulder flexion 132, UT compensation 125 improved mechanics 97 118  Shoulder abduction 123, tight UT/arm  118 with UT pain 85  Shoulder internal rotation    Functional IR to  mid gluteal before compensation with UT to gain more ROM  Shoulder external rotation        (Blank rows = not tested)  A/PROM LEFT   eval  Shoulder extension 41  Shoulder flexion 153  Shoulder abduction 178  Shoulder internal rotation 72  Shoulder external rotation 95    (Blank rows = not tested)  CERVICAL AROM:WNL except tightness noted in bilateral UT  UPPER EXTREMITY STRENGTH:  Middle and lower trap Rt 3-/5  LYMPHEDEMA ASSESSMENTS:   SURGERY TYPE/DATE: 07/25/2021 Right Mastectomy with SLNB and tissue expander, Tissue expander removed 12/04/21  NUMBER OF LYMPH NODES REMOVED: 4/7  CHEMOTHERAPY: YES, 08/25/21-01/19/22  RADIATION:YES, 02/26/22-04/04/2022  HORMONE TREATMENT: YES  INFECTIONS: Yes with need for tissue expander to be removed   LYMPHEDEMA ASSESSMENTS:   LANDMARK RIGHT  eval  At axilla  32.4  15 cm proximal to olecranon process   10 cm proximal to olecranon process 25.6  Olecranon process 24.7  15 cm proximal to ulnar styloid process   10 cm proximal to ulnar styloid process 21.0  Just proximal to ulnar styloid process 15.3  Across hand at thumb web space 19.1  At base of 2nd digit 5.8  (Blank rows = not tested)  LANDMARK LEFT  eval  At axilla  29  15 cm proximal to olecranon process   10 cm proximal to olecranon process 24.2  Olecranon process 24.3  15 cm proximal to ulnar styloid process   10 cm proximal to ulnar styloid process 20.3  Just proximal to ulnar styloid process 14.6  Across hand at thumb web space 18.8  At base of 2nd digit 5.9  (Blank rows = not tested)    GAIT: WNL  L-DEX LYMPHEDEMA SCREENING: The patient was assessed using the L-Dex machine today to produce  a lymphedema index baseline score. The patient will be reassessed on a regular basis (typically every 3 months) to obtain new L-Dex scores. If the score is > 6.5 points away from his/her baseline score indicating onset of subclinical lymphedema, it will be recommended to wear a compression garment for 4 weeks, 12 hours per day and then be reassessed. If the score continues to be > 6.5 points from baseline at reassessment, we will initiate lymphedema treatment. Assessing in this manner has a 95% rate of preventing clinically significant lymphedema. 04/30/2023 SCORE TODAY -1.7  QUICK DASH SURVEY: 61%   TODAY'S TREATMENT:                                                                                                                                         DATE:  07/18/23: Pulleys - flexion, scaption, abduction, extension, functional IR in standing Standing T at counter for Rt shoulder flexion, reach Rt UE into horizontal adduction on counter for lat bias stretch Manual therapy: stripping and elongation of lat, pectoral, deltoid, bicep, Rt scapular mobs with movement in Lt  SL, P/ROM abduction, ER, IR with joint mobs at end range - tacking down pectorals and lat distal to proximal to get to joint mob level Trigger Point Dry-Needling  Treatment instructions: Expect mild to moderate muscle soreness. S/S of pneumothorax if dry needled over a lung field, and to seek immediate medical attention should they occur. Patient verbalized understanding of these instructions and education.  Patient Consent Given: Yes Education handout provided: Previously provided Muscles treated: Rt pectoral near axilla Electrical stimulation performed: No Parameters: N/A Treatment response/outcome: twitch, referral of ache into entire pectoral and into upper shoulder   07/15/23: Manual Therapy Trigger Point Dry-Needling performed by Reather Laurence, PT, DPT Treatment instructions: Expect mild to moderate muscle soreness. S/S of  pneumothorax if dry needled over a lung field, and to seek immediate medical attention should they occur. Patient verbalized understanding of these instructions and education. Patient Consent Given: Yes Education handout provided: Previously provided Muscles treated: Right upper trap, right rhomboids/infraspinatus, right lat, right delts, right thoracic multifidi Electrical stimulation performed: No Parameters: N/A Treatment response/outcome:  Utilized skilled palpation and identification of bony landmarks throughout treatment.  Able to illicit twitch response and muscle elongation. Pt with reports of decreased pain. Treatment resumed by PTA STM with cocoa butter to Rt shoulder, UT and lateral trunk where pt palpably very tight but less so after DN P/ROM to Rt shoulder into flex and scaption with therapist attempting scapular depression during but scap mobility very limited due to tight musculature Joint Mobs with working to tack down pectoral and lat; pt with much tenderness at Rt lat today  06/27/23: Objective measures Pulleys - flexion, scaption, abduction, extension, functional IR in standing Trigger Point Dry-Needling  Treatment instructions: Expect mild to moderate muscle soreness. S/S of pneumothorax if dry needled over a lung field, and to seek immediate medical attention should they occur. Patient verbalized understanding of these instructions and education.  Patient Consent Given: Yes Education handout provided: Previously provided Muscles treated: Rt lat from anterior, pectorals Electrical stimulation performed: No Parameters: N/A Treatment response/outcome: no twitch or response today P/ROM with joint mobs at end range - tacking down pectorals and lat to get to joint mob level Review plan for extension and goal review   DATE:  06/24/23: Therapeutic Exercises Pulleys into IR standing at door for review as pt reports she has been doing these at home and wanted her technique  assessed. She was feeling the beginning of Rt UT compensation and stopping at that point but did also need cuing to keep upper arm at side and not allow for shoulder abduction Manual Therapy Trigger Point Dry-Needling  Treatment instructions: Expect mild to moderate muscle soreness. S/S of pneumothorax if dry needled over a lung field, and to seek immediate medical attention should they occur. Patient verbalized understanding of these instructions and education.  Patient Consent Given: Yes Education handout provided: Previously provided Muscles treated: Rt upper trap and infraspinatus, lat Electrical stimulation performed: No Parameters: N/A Treatment response/outcome: signif twitch and release Rt upper trap and infraspinatus, no response in lat  DN performed by [name] , PT  P/ROM in supine to Rt shoulder intermittently during STM into flexion, abd and D2 with scapular depression by therapist throughout, pt still tight and demonstrates muscle guarding which limits her end motions with her abd still being more limited due to frozen shoulder and pain. Continued with multiple VC's to relax during P/ROM though she was more aware of this during session today and self corrected at times  STM with cocoa butter in Lt S/L to Rt medial scapular border and UT where trigger points palpable Scap Mobs in Lt S/L into protraction and retraction, still with very limited mobility  MFR to Rt medial upper arm where cording visible by puckering at the skin    06/18/23: Self Care Discussed current functional status since pt just recently returned from her trip. She reports her Rt UT feels tight again with palpable trigger points and is starting to feel a pinch around her shoulder blade with Rt shoulder flexion. She feels her cervical A/ROM is good but does feel her UT pulling at end motions. Also feels the cording still in her upper arm, a bit worse than when she was here last before her trip.  Manual  therapy P/ROM in supine to Rt shoulder intermittently during STM into flexion, abd and D2 with scapular depression by therapist throughout, pt still tight and demonstrates muscle guarding which limits her end motions with her abd still being more limited due to frozen shoulder. Continued with multiple VC's to relax during P/ROM  STM with cocoa butter in Lt S/L to Rt medial scapular border and UT where trigger points palpable Scap Mobs in Lt S/L into protraction and retraction, very limited mobility noted but this improved some after MFR to Rt medial upper arm where cording palpable    PATIENT EDUCATION:  Education details: [email or web address], Discussed POC, DN mobilizations, STM, PROM, strength, decrease UT compensation Person educated: Patient Education method: Explanation Education comprehension: verbalized understanding  HOME EXERCISE PROGRAM: Access Code: [email or web address] URL: [email or web address] Date: 05/28/2023 Prepared by: [name]   Exercises - Seated Levator Scapulae Stretch  - 1 x daily - 7 x weekly - 1 sets - 2 reps - 20 hold - Seated Upper Trapezius Stretch  - 1 x daily - 7 x weekly - 1 sets - 2 reps - 20 hold - Shoulder Lat Pull Down with Resistance  - 1 x daily - 7 x weekly - 2 sets - 10 reps - Low Trap Setting at Wall  - 1 x daily - 7 x weekly - 3 sets - 5 reps - Shoulder External Rotation with Anchored Resistance  - 1 x daily - 7 x weekly - 2 sets - 10 reps - Standing Shoulder Internal Rotation with Anchored Resistance  - 1 x daily - 7 x weekly - 2 sets - 10 reps  ASSESSMENT:  CLINICAL IMPRESSION: Pt continues to have limited Rt shoulder ROM limited by both soft tissue shortening and joint capsule with frozen shoulder.  She is getting good release of soft tissues with DN but at this point joint mobilization at end range of available motion tends to give greatest results.  PT did perform DN to pectorals today near axilla with good release.  Pt will continue to  benefit from end range manual therapy, DN and stretching/ROM to maximize functional use of Rt UE.  OBJECTIVE IMPAIRMENTS: decreased knowledge of condition, decreased ROM, decreased strength, increased fascial restrictions, impaired flexibility, impaired UE functional use, postural dysfunction, and pain.   ACTIVITY LIMITATIONS: lifting, sleeping, and reach over head  PARTICIPATION LIMITATIONS:  [place] dancing, working out  PERSONAL FACTORS: 1-2 comorbidities: Right breast cancer s/p chemotherapy and radiation  are also affecting patient's functional outcome.   REHAB POTENTIAL: Good  CLINICAL DECISION MAKING: Stable/uncomplicated  EVALUATION COMPLEXITY: Low  GOALS: Goals reviewed with patient? Yes  SHORT TERM GOALS: Target date: 05/28/2023  Independent with HEP for Right shoulder ROM, strength/stabilization Baseline: Goal status:ONGOING  2.  Decreased pain in Right UT/pecs/ medial arm by 25 % Baseline:  Goal status: MET 6/25  3.  Shoulder flexion and abd improved by 10-12 degrees for improved reaching Baseline:  Goal status: MET Improved shoulder flexion and abd by 15-20 degrees  4.  Improved right scapulo -humeral rhythm and with decreased UT compensation Baseline:  Goal status: ONGOING   LONG TERM GOALS: Target date: 08/22/23  Right UT/pec/arm pain improved by atleast 50%  Baseline: 06/18/23 - 40% improvement at this time ; Goal status: PROGRESSING  2.  Quick dash no greater than 20%for improved function Baseline:  Goal status: INITIAL  3.   Able to perform [place] dancing with correct arm position without increased pain/compensation Baseline:  Goal status: ongoing   4.    Improved right scapulo -humeral rhythm and with decreased UT compensation Baseline: 06/18/23 - pt feels scapulo - humeral rhythm improved about 30% with Rt shoulder A/ROM Goal status: ONGOING   5.  Pt will have improved ability to sleep on the right side without increased pain. Baseline:  Goal  status: ongoing still awakens with pain  PLAN:  PT FREQUENCY: 2x/week  PT DURATION: 8 weeks  PLANNED INTERVENTIONS: Therapeutic exercises, Therapeutic activity, Neuromuscular re-education, Patient/Family education, Self Care, Joint mobilization, Dry Needling, Manual lymph drainage, scar mobilization, Ionotophoresis 4mg /ml Dexamethasone, Manual therapy, and Re-evaluation  PLAN FOR NEXT SESSION: joint mobs at end range, stripping and elongation to pecs, lats, subscapularis, bicep, continue DN and scapular mobs, pulleys, review HEP progression, progress scapular training and mechanics as tol,  lower trap strengthening to decrease compensation, functional training   , PT 07/18/23 10:37 AM

## 2023-07-22 ENCOUNTER — Ambulatory Visit: Payer: 59

## 2023-07-22 DIAGNOSIS — Z170 Estrogen receptor positive status [ER+]: Secondary | ICD-10-CM

## 2023-07-22 DIAGNOSIS — M25611 Stiffness of right shoulder, not elsewhere classified: Secondary | ICD-10-CM | POA: Diagnosis not present

## 2023-07-22 DIAGNOSIS — Z483 Aftercare following surgery for neoplasm: Secondary | ICD-10-CM

## 2023-07-22 DIAGNOSIS — R293 Abnormal posture: Secondary | ICD-10-CM

## 2023-07-22 NOTE — Therapy (Signed)
OUTPATIENT PHYSICAL THERAPY  UPPER EXTREMITY ONCOLOGY TREATMENT  Patient Name: Allison Reed MRN: [id number] DOB:[birth year], 47 y.o., female Today's Date: 07/22/2023  END OF SESSION:  PT End of Session - 07/22/23 1103     Visit Number 16    Number of Visits 32    Date for PT Re-Evaluation 08/22/23    PT Start Time 1104    PT Stop Time 1156    PT Time Calculation (min) 52 min    Activity Tolerance Patient tolerated treatment well;Patient limited by pain    Behavior During Therapy [facility] for tasks assessed/performed                  Past Medical History:  Diagnosis Date   Diabetes mellitus without complication (HCC)    Type 2   Family history of brain tumor    Family history of skin cancer    Gestational diabetes    Headache    otc med prn   Heartburn in pregnancy    Incompetent cervix in pregnancy 04/2015   Missed abortion    no surgery required   Personal history of chemotherapy    Personal history of radiation therapy    Postpartum care following cesarean delivery (11/2) 10/12/2015   right breast ca 06/2021   UTI in pregnancy 03/2015   recent UTI, treatment completed   Past Surgical History:  Procedure Laterality Date   APPENDECTOMY  12/10/2004   BREAST BIOPSY Right 2022   BREAST BIOPSY Left 2022   BREAST RECONSTRUCTION WITH PLACEMENT OF TISSUE EXPANDER AND ALLODERM Right 07/25/2021   Procedure: RIGHT BREAST RECONSTRUCTION WITH PLACEMENT OF TISSUE EXPANDER AND ALLODERM;  Surgeon: Glenna Fellows, MD;  Location: [facility];  Service: Plastics;  Laterality: Right;   CERCLAGE LAPAROSCOPIC ABDOMINAL N/A 04/15/2015   Procedure: LAPAROSCOPIC TRANSABDOMINAL CERVCOISTHMIC CERCLAGE;  Surgeon: Fermin Schwab, MD;  Location: [facility];  Service: Gynecology;  Laterality: N/A;   CERVICAL CERCLAGE  02/08/2011   vaginal   CESAREAN SECTION N/A 10/12/2015   Procedure: CESAREAN SECTION;  Surgeon: Genia Del, MD;  Location: [facility];  Service:  Obstetrics;  Laterality: N/A;   CESAREAN SECTION N/A 03/03/2018   Procedure: Repeat CESAREAN SECTION/Removal of Abdominal Cerclage;  Surgeon: Shea Evans, MD;  Location: [facility];  Service: Obstetrics;  Laterality: N/A;  EDD: 03/28/18   MASTECTOMY Right 2022   MASTECTOMY W/ SENTINEL NODE BIOPSY Right 07/25/2021   Procedure: RIGHT MASTECTOMY WITH AXILLARY SENTINEL LYMPH NODE BIOPSY;  Surgeon: Emelia Loron, MD;  Location: [facility];  Service: General;  Laterality: Right;   PORTACATH PLACEMENT N/A 08/24/2021   Procedure: INSERTION PORT-A-CATH;  Surgeon: Emelia Loron, MD;  Location: [facility];  Service: General;  Laterality: N/A;   RADIOACTIVE SEED GUIDED AXILLARY SENTINEL LYMPH NODE Right 07/25/2021   Procedure: RADIOACTIVE SEED GUIDED RIGHT AXILLARY NODE EXCISION;  Surgeon: Emelia Loron, MD;  Location: [facility];  Service: General;  Laterality: Right;   TISSUE EXPANDER PLACEMENT Right 12/04/2021   Procedure: REMOVAL RIGHT CHEST TISSUE EXPANDER;  Surgeon: Glenna Fellows, MD;  Location: [facility];  Service: Plastics;  Laterality: Right;   WISDOM TOOTH EXTRACTION  12/11/1999   Patient Active Problem List   Diagnosis Date Noted   Lower leg mass, left 05/02/2022   Port-A-Cath in place 08/25/2021   S/P mastectomy, right 07/25/2021   Genetic testing 07/01/2021   Family history of skin cancer 06/22/2021   Family history of brain tumor 06/22/2021   Malignant neoplasm  of overlapping sites of right breast in female, estrogen receptor positive (HCC) 06/21/2021   Status post repeat low transverse cesarean section 03/03/2018   DM (diabetes mellitus) in pregnancy 03/02/2018   Incompetent cervix in pregnancy 03/02/2018    PCP: Dr. Adrian Prince, MD  REFERRING PROVIDER: [name], MD  REFERRING DIAG: S/p Right Mastectomy  THERAPY DIAG:  Stiffness of right shoulder, not elsewhere classified  Abnormal  posture  Malignant neoplasm of overlapping sites of right breast in female, estrogen receptor positive [facility])  Aftercare following surgery for neoplasm  ONSET DATE: April 22,2024  Rationale for Evaluation and Treatment: Rehabilitation  SUBJECTIVE:                                                                                                                                                                                           SUBJECTIVE STATEMENT:  I feel like my shoulder is getting worse. The DN helps for a couple of days and then pain returns again.  PERTINENT HISTORY:  Pt is s/p Right Mastectomy with Tissue Expander and 4+/7 LN on 07/25/21. She is s/p 6 months of chemo followed by radiation. She had emergency surgery to remove tissue expander due to infection on 12/04/2021. She completed radiation on 04/04/22. She will be having right breast reconstruction on 06/24/2023.  PAIN:  Are you having pain? Yes, 0/10 at rest chronic  4/10 in Rt shoulder especially when I do the IR stretch with the pulleys  PRECAUTIONS: Right lymphedema risk  WEIGHT BEARING RESTRICTIONS: No  FALLS:  Has patient fallen in last 6 months? No  LIVING ENVIRONMENT: Lives with: husband and 2 children Lives in: House/apartment Stairs: Yes; Internal: 14 steps; can reach both and External: 0 steps; none   OCCUPATION: Administrative Asst, works from home  LEISURE: walking, work out with trainer,  HAND DOMINANCE: right   PRIOR LEVEL OF FUNCTION: Independent  PATIENT GOALS: Get my mobility back, decrease pain   OBJECTIVE:  COGNITION: Overall cognitive status: Within functional limits for tasks assessed   PALPATION:  7/18: 1/6 GH joint mobility with inferior, posterior, anterior glides  Very tender right UT, supraspinatus  OBSERVATIONS / OTHER ASSESSMENTS:  7/18:   Eval: Right UT compensation, decreased right scapular mobility with shoulder ROM  SENSATION: Light touch: Appears  intact    POSTURE: forward head, rounded shoulders, trunk rotation  UPPER EXTREMITY AROM/PROM:  A/PROM RIGHT   eval  RIGHT 06/04/23 RIGHT 06/18/23 RIGHT 7/18  Shoulder extension 25 (pain ant. shoulder 32 35 (pain ant shoulder) 25  Shoulder flexion 132, UT compensation 125 improved mechanics 97 118  Shoulder abduction 123, tight UT/arm  118  with UT pain 85  Shoulder internal rotation    Functional IR to mid gluteal before compensation with UT to gain more ROM  Shoulder external rotation        (Blank rows = not tested)  A/PROM LEFT   eval  Shoulder extension 41  Shoulder flexion 153  Shoulder abduction 178  Shoulder internal rotation 72  Shoulder external rotation 95    (Blank rows = not tested)  CERVICAL AROM:WNL except tightness noted in bilateral UT  UPPER EXTREMITY STRENGTH:  Middle and lower trap Rt 3-/5  LYMPHEDEMA ASSESSMENTS:   SURGERY TYPE/DATE: 07/25/2021 Right Mastectomy with SLNB and tissue expander, Tissue expander removed 12/04/21  NUMBER OF LYMPH NODES REMOVED: 4/7  CHEMOTHERAPY: YES, 08/25/21-01/19/22  RADIATION:YES, 02/26/22-04/04/2022  HORMONE TREATMENT: YES  INFECTIONS: Yes with need for tissue expander to be removed   LYMPHEDEMA ASSESSMENTS:   LANDMARK RIGHT  eval  At axilla  32.4  15 cm proximal to olecranon process   10 cm proximal to olecranon process 25.6  Olecranon process 24.7  15 cm proximal to ulnar styloid process   10 cm proximal to ulnar styloid process 21.0  Just proximal to ulnar styloid process 15.3  Across hand at thumb web space 19.1  At base of 2nd digit 5.8  (Blank rows = not tested)  LANDMARK LEFT  eval  At axilla  29  15 cm proximal to olecranon process   10 cm proximal to olecranon process 24.2  Olecranon process 24.3  15 cm proximal to ulnar styloid process   10 cm proximal to ulnar styloid process 20.3  Just proximal to ulnar styloid process 14.6  Across hand at thumb web space 18.8  At base of 2nd digit 5.9   (Blank rows = not tested)    GAIT: WNL  L-DEX LYMPHEDEMA SCREENING: The patient was assessed using the L-Dex machine today to produce a lymphedema index baseline score. The patient will be reassessed on a regular basis (typically every 3 months) to obtain new L-Dex scores. If the score is > 6.5 points away from his/her baseline score indicating onset of subclinical lymphedema, it will be recommended to wear a compression garment for 4 weeks, 12 hours per day and then be reassessed. If the score continues to be > 6.5 points from baseline at reassessment, we will initiate lymphedema treatment. Assessing in this manner has a 95% rate of preventing clinically significant lymphedema. 04/30/2023 SCORE TODAY -1.7  QUICK DASH SURVEY: 61%   TODAY'S TREATMENT:                                                                                                                                         DATE:  07/21/2022 Performed SOZO screen at pt request Showed UT and levator scap stretch and pt performed x 2-3 ea. Will start doing at home [facility] mobs gr 3/4 posterior, inferior, SL scapular mobs all  directions STM to right pecs, UT, lats, and SL to scapular area with cupping to Lats and scapular region Supine wand flexion and scaption x 4 with 4 lb wt PROM Right shoulder with contract-relax flexion, scaption, ER Serratus on wall x 5 for scapular mobility Discussed frozen shoulder;pt could see ortho, may benefit from muscle relaxers or antiinflamatories  07/18/23: Pulleys - flexion, scaption, abduction, extension, functional IR in standing Standing T at counter for Rt shoulder flexion, reach Rt UE into horizontal adduction on counter for lat bias stretch Manual therapy: stripping and elongation of lat, pectoral, deltoid, bicep, Rt scapular mobs with movement in Lt SL, P/ROM abduction, ER, IR with joint mobs at end range - tacking down pectorals and lat distal to proximal to get to joint mob level Trigger Point  Dry-Needling  Treatment instructions: Expect mild to moderate muscle soreness. S/S of pneumothorax if dry needled over a lung field, and to seek immediate medical attention should they occur. Patient verbalized understanding of these instructions and education.  Patient Consent Given: Yes Education handout provided: Previously provided Muscles treated: Rt pectoral near axilla Electrical stimulation performed: No Parameters: N/A Treatment response/outcome: twitch, referral of ache into entire pectoral and into upper shoulder   07/15/23: Manual Therapy Trigger Point Dry-Needling performed by Reather Laurence, PT, DPT Treatment instructions: Expect mild to moderate muscle soreness. S/S of pneumothorax if dry needled over a lung field, and to seek immediate medical attention should they occur. Patient verbalized understanding of these instructions and education. Patient Consent Given: Yes Education handout provided: Previously provided Muscles treated: Right upper trap, right rhomboids/infraspinatus, right lat, right delts, right thoracic multifidi Electrical stimulation performed: No Parameters: N/A Treatment response/outcome:  Utilized skilled palpation and identification of bony landmarks throughout treatment.  Able to illicit twitch response and muscle elongation. Pt with reports of decreased pain. Treatment resumed by PTA STM with cocoa butter to Rt shoulder, UT and lateral trunk where pt palpably very tight but less so after DN P/ROM to Rt shoulder into flex and scaption with therapist attempting scapular depression during but scap mobility very limited due to tight musculature Joint Mobs with working to tack down pectoral and lat; pt with much tenderness at Rt lat today  06/27/23: Objective measures Pulleys - flexion, scaption, abduction, extension, functional IR in standing Trigger Point Dry-Needling  Treatment instructions: Expect mild to moderate muscle soreness. S/S of pneumothorax if  dry needled over a lung field, and to seek immediate medical attention should they occur. Patient verbalized understanding of these instructions and education.  Patient Consent Given: Yes Education handout provided: Previously provided Muscles treated: Rt lat from anterior, pectorals Electrical stimulation performed: No Parameters: N/A Treatment response/outcome: no twitch or response today P/ROM with joint mobs at end range - tacking down pectorals and lat to get to joint mob level Review plan for extension and goal review   DATE:  06/24/23: Therapeutic Exercises Pulleys into IR standing at door for review as pt reports she has been doing these at home and wanted her technique assessed. She was feeling the beginning of Rt UT compensation and stopping at that point but did also need cuing to keep upper arm at side and not allow for shoulder abduction Manual Therapy Trigger Point Dry-Needling  Treatment instructions: Expect mild to moderate muscle soreness. S/S of pneumothorax if dry needled over a lung field, and to seek immediate medical attention should they occur. Patient verbalized understanding of these instructions and education.  Patient Consent Given: Yes Education handout  provided: Previously provided Muscles treated: Rt upper trap and infraspinatus, lat Electrical stimulation performed: No Parameters: N/A Treatment response/outcome: signif twitch and release Rt upper trap and infraspinatus, no response in lat  DN performed by [name] Beuhring, PT  P/ROM in supine to Rt shoulder intermittently during STM into flexion, abd and D2 with scapular depression by therapist throughout, pt still tight and demonstrates muscle guarding which limits her end motions with her abd still being more limited due to frozen shoulder and pain. Continued with multiple VC's to relax during P/ROM though she was more aware of this during session today and self corrected at times STM with cocoa butter in  Lt S/L to Rt medial scapular border and UT where trigger points palpable Scap Mobs in Lt S/L into protraction and retraction, still with very limited mobility  MFR to Rt medial upper arm where cording visible by puckering at the skin    06/18/23: Self Care Discussed current functional status since pt just recently returned from her trip. She reports her Rt UT feels tight again with palpable trigger points and is starting to feel a pinch around her shoulder blade with Rt shoulder flexion. She feels her cervical A/ROM is good but does feel her UT pulling at end motions. Also feels the cording still in her upper arm, a bit worse than when she was here last before her trip.  Manual therapy P/ROM in supine to Rt shoulder intermittently during STM into flexion, abd and D2 with scapular depression by therapist throughout, pt still tight and demonstrates muscle guarding which limits her end motions with her abd still being more limited due to frozen shoulder. Continued with multiple VC's to relax during P/ROM  STM with cocoa butter in Lt S/L to Rt medial scapular border and UT where trigger points palpable Scap Mobs in Lt S/L into protraction and retraction, very limited mobility noted but this improved some after MFR to Rt medial upper arm where cording palpable    PATIENT EDUCATION:  Education details: [email or web address], Discussed POC, DN mobilizations, STM, PROM, strength, decrease UT compensation Person educated: Patient Education method: Explanation Education comprehension: verbalized understanding  HOME EXERCISE PROGRAM: Access Code: [email or web address] URL: [email or web address] Date: 05/28/2023 Prepared by: [name] Beuhring  Exercises - Seated Levator Scapulae Stretch  - 1 x daily - 7 x weekly - 1 sets - 2 reps - 20 hold - Seated Upper Trapezius Stretch  - 1 x daily - 7 x weekly - 1 sets - 2 reps - 20 hold - Shoulder Lat Pull Down with Resistance  - 1 x daily - 7 x weekly - 2 sets - 10  reps - Low Trap Setting at Wall  - 1 x daily - 7 x weekly - 3 sets - 5 reps - Shoulder External Rotation with Anchored Resistance  - 1 x daily - 7 x weekly - 2 sets - 10 reps - Standing Shoulder Internal Rotation with Anchored Resistance  - 1 x daily - 7 x weekly - 2 sets - 10 reps  ASSESSMENT:  CLINICAL IMPRESSION: Pts SOZO screen WNL/ Pt continues with decreased capsular mobility throughout and muscular tightness throughout the right UQ. She is compliant with HEP. She has greater ROM restrictions than when last seen by this PT.  OBJECTIVE IMPAIRMENTS: decreased knowledge of condition, decreased ROM, decreased strength, increased fascial restrictions, impaired flexibility, impaired UE functional use, postural dysfunction, and pain.   ACTIVITY LIMITATIONS: lifting, sleeping, and reach over head  PARTICIPATION LIMITATIONS:  [place] dancing, working  out  PERSONAL FACTORS: 1-2 comorbidities: Right breast cancer s/p chemotherapy and radiation  are also affecting patient's functional outcome.   REHAB POTENTIAL: Good  CLINICAL DECISION MAKING: Stable/uncomplicated  EVALUATION COMPLEXITY: Low  GOALS: Goals reviewed with patient? Yes  SHORT TERM GOALS: Target date: 05/28/2023  Independent with HEP for Right shoulder ROM, strength/stabilization Baseline: Goal status:ONGOING  2.  Decreased pain in Right UT/pecs/ medial arm by 25 % Baseline:  Goal status: MET 6/25  3.  Shoulder flexion and abd improved by 10-12 degrees for improved reaching Baseline:  Goal status: MET Improved shoulder flexion and abd by 15-20 degrees  4.  Improved right scapulo -humeral rhythm and with decreased UT compensation Baseline:  Goal status: ONGOING   LONG TERM GOALS: Target date: 08/22/23  Right UT/pec/arm pain improved by atleast 50%  Baseline: 06/18/23 - 40% improvement at this time ; Goal status: PROGRESSING  2.  Quick dash no greater than 20%for improved function Baseline:  Goal status:  INITIAL  3.   Able to perform [place] dancing with correct arm position without increased pain/compensation Baseline:  Goal status: ongoing   4.    Improved right scapulo -humeral rhythm and with decreased UT compensation Baseline: 06/18/23 - pt feels scapulo - humeral rhythm improved about 30% with Rt shoulder A/ROM Goal status: ONGOING   5.  Pt will have improved ability to sleep on the right side without increased pain. Baseline:  Goal status: ongoing still awakens with pain  PLAN:  PT FREQUENCY: 2x/week  PT DURATION: 8 weeks  PLANNED INTERVENTIONS: Therapeutic exercises, Therapeutic activity, Neuromuscular re-education, Patient/Family education, Self Care, Joint mobilization, Dry Needling, Manual lymph drainage, scar mobilization, Ionotophoresis 4mg /ml Dexamethasone, Manual therapy, and Re-evaluation  PLAN FOR NEXT SESSION: joint mobs at end range, stripping and elongation to pecs, lats, subscapularis, bicep, continue DN and scapular mobs, pulleys, review HEP progression, progress scapular training and mechanics as tol,  lower trap strengthening to decrease compensation, functional training  Johanna Beuhring, PT 07/22/23 11:59 AM

## 2023-07-24 ENCOUNTER — Encounter: Payer: Self-pay | Admitting: Hematology and Oncology

## 2023-07-24 ENCOUNTER — Ambulatory Visit: Payer: 59

## 2023-07-24 DIAGNOSIS — M25611 Stiffness of right shoulder, not elsewhere classified: Secondary | ICD-10-CM | POA: Diagnosis not present

## 2023-07-24 DIAGNOSIS — Z483 Aftercare following surgery for neoplasm: Secondary | ICD-10-CM

## 2023-07-24 DIAGNOSIS — R293 Abnormal posture: Secondary | ICD-10-CM

## 2023-07-24 DIAGNOSIS — C50811 Malignant neoplasm of overlapping sites of right female breast: Secondary | ICD-10-CM

## 2023-07-24 NOTE — Telephone Encounter (Signed)
Attempted to call pt regarding signatera results lvm for pt to return call back to receive results. Results was negative. Signatera will be out in the next 3-6 months to repeat labs.

## 2023-07-24 NOTE — Therapy (Signed)
OUTPATIENT PHYSICAL THERAPY  UPPER EXTREMITY ONCOLOGY TREATMENT  Patient Name: Allison Reed MRN: [id number] DOB:1976/10/09, 47 y.o., female Today's Date: 07/24/2023  END OF SESSION:  PT End of Session - 07/24/23 0927     Visit Number 17    Date for PT Re-Evaluation 08/22/23    PT Start Time 0847    PT Stop Time 0928    PT Time Calculation (min) 41 min                   Past Medical History:  Diagnosis Date   Diabetes mellitus without complication (HCC)    Type 2   Family history of brain tumor    Family history of skin cancer    Gestational diabetes    Headache    otc med prn   Heartburn in pregnancy    Incompetent cervix in pregnancy 04/2015   Missed abortion    no surgery required   Personal history of chemotherapy    Personal history of radiation therapy    Postpartum care following cesarean delivery (11/2) 10/12/2015   right breast ca 06/2021   UTI in pregnancy 03/2015   recent UTI, treatment completed   Past Surgical History:  Procedure Laterality Date   APPENDECTOMY  12/10/2004   BREAST BIOPSY Right 2022   BREAST BIOPSY Left 2022   BREAST RECONSTRUCTION WITH PLACEMENT OF TISSUE EXPANDER AND ALLODERM Right 07/25/2021   Procedure: RIGHT BREAST RECONSTRUCTION WITH PLACEMENT OF TISSUE EXPANDER AND ALLODERM;  Surgeon: Glenna Fellows, MD;  Location: [facility];  Service: Plastics;  Laterality: Right;   CERCLAGE LAPAROSCOPIC ABDOMINAL N/A 04/15/2015   Procedure: LAPAROSCOPIC TRANSABDOMINAL CERVCOISTHMIC CERCLAGE;  Surgeon: Fermin Schwab, MD;  Location: [facility];  Service: Gynecology;  Laterality: N/A;   CERVICAL CERCLAGE  02/08/2011   vaginal   CESAREAN SECTION N/A 10/12/2015   Procedure: CESAREAN SECTION;  Surgeon: Genia Del, MD;  Location: [facility];  Service: Obstetrics;  Laterality: N/A;   CESAREAN SECTION N/A 03/03/2018   Procedure: Repeat CESAREAN SECTION/Removal of Abdominal Cerclage;  Surgeon: Shea Evans, MD;   Location: [facility];  Service: Obstetrics;  Laterality: N/A;  EDD: 03/28/18   MASTECTOMY Right 2022   MASTECTOMY W/ SENTINEL NODE BIOPSY Right 07/25/2021   Procedure: RIGHT MASTECTOMY WITH AXILLARY SENTINEL LYMPH NODE BIOPSY;  Surgeon: Emelia Loron, MD;  Location: [facility];  Service: General;  Laterality: Right;   PORTACATH PLACEMENT N/A 08/24/2021   Procedure: INSERTION PORT-A-CATH;  Surgeon: Emelia Loron, MD;  Location: [facility];  Service: General;  Laterality: N/A;   RADIOACTIVE SEED GUIDED AXILLARY SENTINEL LYMPH NODE Right 07/25/2021   Procedure: RADIOACTIVE SEED GUIDED RIGHT AXILLARY NODE EXCISION;  Surgeon: Emelia Loron, MD;  Location: [facility];  Service: General;  Laterality: Right;   TISSUE EXPANDER PLACEMENT Right 12/04/2021   Procedure: REMOVAL RIGHT CHEST TISSUE EXPANDER;  Surgeon: Glenna Fellows, MD;  Location: [facility];  Service: Plastics;  Laterality: Right;   WISDOM TOOTH EXTRACTION  12/11/1999   Patient Active Problem List   Diagnosis Date Noted   Lower leg mass, left 05/02/2022   Port-A-Cath in place 08/25/2021   S/P mastectomy, right 07/25/2021   Genetic testing 07/01/2021   Family history of skin cancer 06/22/2021   Family history of brain tumor 06/22/2021   Malignant neoplasm of overlapping sites of right breast in female, estrogen receptor positive (HCC) 06/21/2021   Status post repeat low transverse cesarean section 03/03/2018   DM (diabetes mellitus)  in pregnancy 03/02/2018   Incompetent cervix in pregnancy 03/02/2018    PCP: Dr. Adrian Prince, MD  REFERRING PROVIDER: [name], MD  REFERRING DIAG: S/p Right Mastectomy  THERAPY DIAG:  Stiffness of right shoulder, not elsewhere classified  Abnormal posture  Malignant neoplasm of overlapping sites of right breast in female, estrogen receptor positive [facility])  Aftercare following surgery for neoplasm  ONSET DATE: April  22,2024  Rationale for Evaluation and Treatment: Rehabilitation  SUBJECTIVE:                                                                                                                                                                                           SUBJECTIVE STATEMENT:  I am having pain shooting to my hand on the Rt.  I have been stretching a lot at home  PERTINENT HISTORY:  Pt is s/p Right Mastectomy with Tissue Expander and 4+/7 LN on 07/25/21. She is s/p 6 months of chemo followed by radiation. She had emergency surgery to remove tissue expander due to infection on 12/04/2021. She completed radiation on 04/04/22. She will be having right breast reconstruction on 06/24/2023.  PAIN:  Are you having pain? Yes, 3/10 at rest chronic  4/10 in Rt shoulder especially when I do the IR stretch with the pulleys  PRECAUTIONS: Right lymphedema risk  WEIGHT BEARING RESTRICTIONS: No  FALLS:  Has patient fallen in last 6 months? No  LIVING ENVIRONMENT: Lives with: husband and 2 children Lives in: House/apartment Stairs: Yes; Internal: 14 steps; can reach both and External: 0 steps; none   OCCUPATION: Administrative Asst, works from home  LEISURE: walking, work out with trainer,  HAND DOMINANCE: right   PRIOR LEVEL OF FUNCTION: Independent  PATIENT GOALS: Get my mobility back, decrease pain   OBJECTIVE:  COGNITION: Overall cognitive status: Within functional limits for tasks assessed   PALPATION:  7/18: 1/6 GH joint mobility with inferior, posterior, anterior glides  Very tender right UT, supraspinatus  OBSERVATIONS / OTHER ASSESSMENTS:  7/18:   Eval: Right UT compensation, decreased right scapular mobility with shoulder ROM  SENSATION: Light touch: Appears intact    POSTURE: forward head, rounded shoulders, trunk rotation  UPPER EXTREMITY AROM/PROM:  A/PROM RIGHT   eval  RIGHT 06/04/23 RIGHT 06/18/23 RIGHT 7/18  Shoulder extension 25 (pain ant.  shoulder 32 35 (pain ant shoulder) 25  Shoulder flexion 132, UT compensation 125 improved mechanics 97 118  Shoulder abduction 123, tight UT/arm  118 with UT pain 85  Shoulder internal rotation    Functional IR to mid gluteal before compensation with UT to gain more ROM  Shoulder external rotation        (  Blank rows = not tested)  A/PROM LEFT   eval  Shoulder extension 41  Shoulder flexion 153  Shoulder abduction 178  Shoulder internal rotation 72  Shoulder external rotation 95    (Blank rows = not tested)  CERVICAL AROM:WNL except tightness noted in bilateral UT  UPPER EXTREMITY STRENGTH:  Middle and lower trap Rt 3-/5  LYMPHEDEMA ASSESSMENTS:   SURGERY TYPE/DATE: 07/25/2021 Right Mastectomy with SLNB and tissue expander, Tissue expander removed 12/04/21  NUMBER OF LYMPH NODES REMOVED: 4/7  CHEMOTHERAPY: YES, 08/25/21-01/19/22  RADIATION:YES, 02/26/22-04/04/2022  HORMONE TREATMENT: YES  INFECTIONS: Yes with need for tissue expander to be removed   LYMPHEDEMA ASSESSMENTS:   LANDMARK RIGHT  eval  At axilla  32.4  15 cm proximal to olecranon process   10 cm proximal to olecranon process 25.6  Olecranon process 24.7  15 cm proximal to ulnar styloid process   10 cm proximal to ulnar styloid process 21.0  Just proximal to ulnar styloid process 15.3  Across hand at thumb web space 19.1  At base of 2nd digit 5.8  (Blank rows = not tested)  LANDMARK LEFT  eval  At axilla  29  15 cm proximal to olecranon process   10 cm proximal to olecranon process 24.2  Olecranon process 24.3  15 cm proximal to ulnar styloid process   10 cm proximal to ulnar styloid process 20.3  Just proximal to ulnar styloid process 14.6  Across hand at thumb web space 18.8  At base of 2nd digit 5.9  (Blank rows = not tested)    GAIT: WNL  L-DEX LYMPHEDEMA SCREENING: The patient was assessed using the L-Dex machine today to produce a lymphedema index baseline score. The patient will be  reassessed on a regular basis (typically every 3 months) to obtain new L-Dex scores. If the score is > 6.5 points away from his/her baseline score indicating onset of subclinical lymphedema, it will be recommended to wear a compression garment for 4 weeks, 12 hours per day and then be reassessed. If the score continues to be > 6.5 points from baseline at reassessment, we will initiate lymphedema treatment. Assessing in this manner has a 95% rate of preventing clinically significant lymphedema. 04/30/2023 SCORE TODAY -1.7  QUICK DASH SURVEY: 61%   TODAY'S TREATMENT:     07/24/23: Pulleys - flexion, scaption, abduction, extension  Manual therapy: elongation and release to all areas needled and scapular mobs  Trigger Point Dry-Needling  Treatment instructions: Expect mild to moderate muscle soreness. S/S of pneumothorax if dry needled over a lung field, and to seek immediate medical attention should they occur. Patient verbalized understanding of these instructions and education.  Patient Consent Given: Yes Education handout provided: Previously provided Muscles treated: Rt pectoral near axilla, Rt upper traps, rhomboids and lats, posterior deltoid  Electrical stimulation performed: No Parameters: N/A Treatment response/outcome: twitch, referral of ache into entire pectoral and into upper shoulder  DATE:  07/21/2022 Performed SOZO screen at pt request Showed UT and levator scap stretch and pt performed x 2-3 ea. Will start doing at home [facility] mobs gr 3/4 posterior, inferior, SL scapular mobs all directions STM to right pecs, UT, lats, and SL to scapular area with cupping to Lats and scapular region Supine wand flexion and scaption x 4 with 4 lb wt PROM Right shoulder with contract-relax flexion, scaption, ER Serratus on wall x 5 for scapular mobility Discussed frozen  shoulder;pt could see ortho, may benefit from muscle relaxers or antiinflamatories  07/18/23: Pulleys - flexion, scaption, abduction, extension, functional IR in standing Standing T at counter for Rt shoulder flexion, reach Rt UE into horizontal adduction on counter for lat bias stretch Manual therapy: stripping and elongation of lat, pectoral, deltoid, bicep, Rt scapular mobs with movement in Lt SL, P/ROM abduction, ER, IR with joint mobs at end range - tacking down pectorals and lat distal to proximal to get to joint mob level Trigger Point Dry-Needling  Treatment instructions: Expect mild to moderate muscle soreness. S/S of pneumothorax if dry needled over a lung field, and to seek immediate medical attention should they occur. Patient verbalized understanding of these instructions and education.  Patient Consent Given: Yes Education handout provided: Previously provided Muscles treated: Rt pectoral near axilla Electrical stimulation performed: No Parameters: N/A Treatment response/outcome: twitch, referral of ache into entire pectoral and into upper shoulder   PATIENT EDUCATION:  Education details: [email or web address], Discussed POC, DN mobilizations, STM, PROM, strength, decrease UT compensation Person educated: Patient Education method: Explanation Education comprehension: verbalized understanding  HOME EXERCISE PROGRAM: Access Code: [email or web address] URL: [email or web address] Date: 05/28/2023 Prepared by: [name] Beuhring  Exercises - Seated Levator Scapulae Stretch  - 1 x daily - 7 x weekly - 1 sets - 2 reps - 20 hold - Seated Upper Trapezius Stretch  - 1 x daily - 7 x weekly - 1 sets - 2 reps - 20 hold - Shoulder Lat Pull Down with Resistance  - 1 x daily - 7 x weekly - 2 sets - 10 reps - Low Trap Setting at Wall  - 1 x daily - 7 x weekly - 3 sets - 5 reps - Shoulder External Rotation with Anchored Resistance  - 1 x daily - 7 x weekly - 2 sets - 10 reps - Standing Shoulder  Internal Rotation with Anchored Resistance  - 1 x daily - 7 x weekly - 2 sets - 10 reps  ASSESSMENT:  CLINICAL IMPRESSION: Pt with complaint of Rt shoulder pain and tingling to the thumb and pinky finger.  Session focused on flexibility and DN to Rt upper quarter to address trigger points.  Multiple twitch responses in all regions and improved tissue mobility at the end of session.  PT emphasized importance of stretching regularly to maximize benefit.  Patient will benefit from skilled PT to address the below impairments and improve overall function.   OBJECTIVE IMPAIRMENTS: decreased knowledge of condition, decreased ROM, decreased strength, increased fascial restrictions, impaired flexibility, impaired UE functional use, postural dysfunction, and pain.   ACTIVITY LIMITATIONS: lifting, sleeping, and reach over head  PARTICIPATION LIMITATIONS:  [place] dancing, working out  PERSONAL FACTORS: 1-2 comorbidities: Right breast cancer s/p chemotherapy and radiation  are also affecting patient's functional outcome.   REHAB POTENTIAL: Good  CLINICAL DECISION MAKING: Stable/uncomplicated  EVALUATION COMPLEXITY: Low  GOALS: Goals reviewed with patient? Yes  SHORT TERM GOALS: Target date: 05/28/2023  Independent with HEP for Right shoulder ROM,  strength/stabilization Baseline: Goal status:ONGOING  2.  Decreased pain in Right UT/pecs/ medial arm by 25 % Baseline:  Goal status: MET 6/25  3.  Shoulder flexion and abd improved by 10-12 degrees for improved reaching Baseline:  Goal status: MET Improved shoulder flexion and abd by 15-20 degrees  4.  Improved right scapulo -humeral rhythm and with decreased UT compensation Baseline:  Goal status: ONGOING   LONG TERM GOALS: Target date: 08/22/23  Right UT/pec/arm pain improved by atleast 50%  Baseline: 06/18/23 - 40% improvement at this time ; Goal status: PROGRESSING  2.  Quick dash no greater than 20%for improved function Baseline:   Goal status: INITIAL  3.   Able to perform [place] dancing with correct arm position without increased pain/compensation Baseline:  Goal status: ongoing   4.    Improved right scapulo -humeral rhythm and with decreased UT compensation Baseline: 06/18/23 - pt feels scapulo - humeral rhythm improved about 30% with Rt shoulder A/ROM Goal status: ONGOING   5.  Pt will have improved ability to sleep on the right side without increased pain. Baseline:  Goal status: ongoing still awakens with pain  PLAN:  PT FREQUENCY: 2x/week  PT DURATION: 8 weeks  PLANNED INTERVENTIONS: Therapeutic exercises, Therapeutic activity, Neuromuscular re-education, Patient/Family education, Self Care, Joint mobilization, Dry Needling, Manual lymph drainage, scar mobilization, Ionotophoresis 4mg /ml Dexamethasone, Manual therapy, and Re-evaluation  PLAN FOR NEXT SESSION: assess response to DN, joint mobs at end range, stripping and elongation to pecs, lats, subscapularis, bicep, continue DN and scapular mobs, pulleys, review HEP progression, progress scapular training and mechanics as tol,  lower trap strengthening to decrease compensation, functional training  Lorrene Reid, PT 07/24/23 9:39 AM

## 2023-07-26 ENCOUNTER — Inpatient Hospital Stay: Payer: 59

## 2023-07-30 ENCOUNTER — Inpatient Hospital Stay: Payer: 59

## 2023-07-30 ENCOUNTER — Ambulatory Visit: Payer: 59

## 2023-07-30 DIAGNOSIS — M25611 Stiffness of right shoulder, not elsewhere classified: Secondary | ICD-10-CM | POA: Diagnosis not present

## 2023-07-30 DIAGNOSIS — R293 Abnormal posture: Secondary | ICD-10-CM

## 2023-07-30 DIAGNOSIS — I890 Lymphedema, not elsewhere classified: Secondary | ICD-10-CM

## 2023-07-30 DIAGNOSIS — Z170 Estrogen receptor positive status [ER+]: Secondary | ICD-10-CM

## 2023-07-30 DIAGNOSIS — Z483 Aftercare following surgery for neoplasm: Secondary | ICD-10-CM

## 2023-07-30 NOTE — Therapy (Signed)
OUTPATIENT PHYSICAL THERAPY  UPPER EXTREMITY ONCOLOGY TREATMENT  Patient Name: Allison Reed MRN: [id number] DOB:February 13, 1976, 47 y.o., female Today's Date: 07/30/2023  END OF SESSION:  PT End of Session - 07/30/23 0806     Visit Number 18    Number of Visits 32    Date for PT Re-Evaluation 08/22/23    PT Start Time 0803    PT Stop Time 0845   pt requested to leave early   PT Time Calculation (min) 42 min    Activity Tolerance Patient tolerated treatment well;Patient limited by pain    Behavior During Therapy [facility] for tasks assessed/performed                   Past Medical History:  Diagnosis Date   Diabetes mellitus without complication (HCC)    Type 2   Family history of brain tumor    Family history of skin cancer    Gestational diabetes    Headache    otc med prn   Heartburn in pregnancy    Incompetent cervix in pregnancy 04/2015   Missed abortion    no surgery required   Personal history of chemotherapy    Personal history of radiation therapy    Postpartum care following cesarean delivery (11/2) 10/12/2015   right breast ca 06/2021   UTI in pregnancy 03/2015   recent UTI, treatment completed   Past Surgical History:  Procedure Laterality Date   APPENDECTOMY  12/10/2004   BREAST BIOPSY Right 2022   BREAST BIOPSY Left 2022   BREAST RECONSTRUCTION WITH PLACEMENT OF TISSUE EXPANDER AND ALLODERM Right 07/25/2021   Procedure: RIGHT BREAST RECONSTRUCTION WITH PLACEMENT OF TISSUE EXPANDER AND ALLODERM;  Surgeon: Glenna Fellows, MD;  Location: [facility];  Service: Plastics;  Laterality: Right;   CERCLAGE LAPAROSCOPIC ABDOMINAL N/A 04/15/2015   Procedure: LAPAROSCOPIC TRANSABDOMINAL CERVCOISTHMIC CERCLAGE;  Surgeon: Fermin Schwab, MD;  Location: [facility];  Service: Gynecology;  Laterality: N/A;   CERVICAL CERCLAGE  02/08/2011   vaginal   CESAREAN SECTION N/A 10/12/2015   Procedure: CESAREAN SECTION;  Surgeon: Genia Del, MD;   Location: [facility];  Service: Obstetrics;  Laterality: N/A;   CESAREAN SECTION N/A 03/03/2018   Procedure: Repeat CESAREAN SECTION/Removal of Abdominal Cerclage;  Surgeon: Shea Evans, MD;  Location: [facility];  Service: Obstetrics;  Laterality: N/A;  EDD: 03/28/18   MASTECTOMY Right 2022   MASTECTOMY W/ SENTINEL NODE BIOPSY Right 07/25/2021   Procedure: RIGHT MASTECTOMY WITH AXILLARY SENTINEL LYMPH NODE BIOPSY;  Surgeon: Emelia Loron, MD;  Location: [facility];  Service: General;  Laterality: Right;   PORTACATH PLACEMENT N/A 08/24/2021   Procedure: INSERTION PORT-A-CATH;  Surgeon: Emelia Loron, MD;  Location: [facility];  Service: General;  Laterality: N/A;   RADIOACTIVE SEED GUIDED AXILLARY SENTINEL LYMPH NODE Right 07/25/2021   Procedure: RADIOACTIVE SEED GUIDED RIGHT AXILLARY NODE EXCISION;  Surgeon: Emelia Loron, MD;  Location: [facility];  Service: General;  Laterality: Right;   TISSUE EXPANDER PLACEMENT Right 12/04/2021   Procedure: REMOVAL RIGHT CHEST TISSUE EXPANDER;  Surgeon: Glenna Fellows, MD;  Location: [facility];  Service: Plastics;  Laterality: Right;   WISDOM TOOTH EXTRACTION  12/11/1999   Patient Active Problem List   Diagnosis Date Noted   Lower leg mass, left 05/02/2022   Port-A-Cath in place 08/25/2021   S/P mastectomy, right 07/25/2021   Genetic testing 07/01/2021   Family history of skin cancer 06/22/2021   Family history of  brain tumor 06/22/2021   Malignant neoplasm of overlapping sites of right breast in female, estrogen receptor positive (HCC) 06/21/2021   Status post repeat low transverse cesarean section 03/03/2018   DM (diabetes mellitus) in pregnancy 03/02/2018   Incompetent cervix in pregnancy 03/02/2018    PCP: Dr. Adrian Prince, MD  REFERRING PROVIDER: [name], MD  REFERRING DIAG: S/p Right Mastectomy  THERAPY DIAG:  Stiffness of right shoulder, not elsewhere  classified  Abnormal posture  Malignant neoplasm of overlapping sites of right breast in female, estrogen receptor positive [facility])  Aftercare following surgery for neoplasm  Lymphedema, not elsewhere classified  ONSET DATE: April 22,2024  Rationale for Evaluation and Treatment: Rehabilitation  SUBJECTIVE:                                                                                                                                                                                           SUBJECTIVE STATEMENT:  What [name] did last time really helped.   PERTINENT HISTORY:  Pt is s/p Right Mastectomy with Tissue Expander and 4+/7 LN on 07/25/21. She is s/p 6 months of chemo followed by radiation. She had emergency surgery to remove tissue expander due to infection on 12/04/2021. She completed radiation on 04/04/22. She will be having right breast reconstruction on 06/24/2023.  PAIN:  Are you having pain? Yes, 7/10 at constant ache  PRECAUTIONS: Right lymphedema risk  WEIGHT BEARING RESTRICTIONS: No  FALLS:  Has patient fallen in last 6 months? No  LIVING ENVIRONMENT: Lives with: husband and 2 children Lives in: House/apartment Stairs: Yes; Internal: 14 steps; can reach both and External: 0 steps; none   OCCUPATION: Administrative Asst, works from home  LEISURE: walking, work out with trainer,  HAND DOMINANCE: right   PRIOR LEVEL OF FUNCTION: Independent  PATIENT GOALS: Get my mobility back, decrease pain   OBJECTIVE:  COGNITION: Overall cognitive status: Within functional limits for tasks assessed   PALPATION:  7/18: 1/6 GH joint mobility with inferior, posterior, anterior glides  Very tender right UT, supraspinatus  OBSERVATIONS / OTHER ASSESSMENTS:  7/18:   Eval: Right UT compensation, decreased right scapular mobility with shoulder ROM  SENSATION: Light touch: Appears intact    POSTURE: forward head, rounded shoulders, trunk rotation  UPPER  EXTREMITY AROM/PROM:  A/PROM RIGHT   eval  RIGHT 06/04/23 RIGHT 06/18/23 RIGHT 7/18  Shoulder extension 25 (pain ant. shoulder 32 35 (pain ant shoulder) 25  Shoulder flexion 132, UT compensation 125 improved mechanics 97 118  Shoulder abduction 123, tight UT/arm  118 with UT pain 85  Shoulder internal rotation    Functional IR to mid gluteal  before compensation with UT to gain more ROM  Shoulder external rotation        (Blank rows = not tested)  A/PROM LEFT   eval  Shoulder extension 41  Shoulder flexion 153  Shoulder abduction 178  Shoulder internal rotation 72  Shoulder external rotation 95    (Blank rows = not tested)  CERVICAL AROM:WNL except tightness noted in bilateral UT  UPPER EXTREMITY STRENGTH:  Middle and lower trap Rt 3-/5  LYMPHEDEMA ASSESSMENTS:   SURGERY TYPE/DATE: 07/25/2021 Right Mastectomy with SLNB and tissue expander, Tissue expander removed 12/04/21  NUMBER OF LYMPH NODES REMOVED: 4/7  CHEMOTHERAPY: YES, 08/25/21-01/19/22  RADIATION:YES, 02/26/22-04/04/2022  HORMONE TREATMENT: YES  INFECTIONS: Yes with need for tissue expander to be removed   LYMPHEDEMA ASSESSMENTS:   LANDMARK RIGHT  eval  At axilla  32.4  15 cm proximal to olecranon process   10 cm proximal to olecranon process 25.6  Olecranon process 24.7  15 cm proximal to ulnar styloid process   10 cm proximal to ulnar styloid process 21.0  Just proximal to ulnar styloid process 15.3  Across hand at thumb web space 19.1  At base of 2nd digit 5.8  (Blank rows = not tested)  LANDMARK LEFT  eval  At axilla  29  15 cm proximal to olecranon process   10 cm proximal to olecranon process 24.2  Olecranon process 24.3  15 cm proximal to ulnar styloid process   10 cm proximal to ulnar styloid process 20.3  Just proximal to ulnar styloid process 14.6  Across hand at thumb web space 18.8  At base of 2nd digit 5.9  (Blank rows = not tested)    GAIT: WNL  L-DEX LYMPHEDEMA SCREENING: The  patient was assessed using the L-Dex machine today to produce a lymphedema index baseline score. The patient will be reassessed on a regular basis (typically every 3 months) to obtain new L-Dex scores. If the score is > 6.5 points away from his/her baseline score indicating onset of subclinical lymphedema, it will be recommended to wear a compression garment for 4 weeks, 12 hours per day and then be reassessed. If the score continues to be > 6.5 points from baseline at reassessment, we will initiate lymphedema treatment. Assessing in this manner has a 95% rate of preventing clinically significant lymphedema. 04/30/2023 SCORE TODAY -1.7  QUICK DASH SURVEY: 61%   TODAY'S TREATMENT:     07/30/23: Manual Therapy GH mobs gr 3/4 posterior to Rt shoulder STM in prone with cocoa butter for cupping to Rt UT and superior scapular region over multiple trigger points  P/ROM as able mostly into flexion, but some scaption and D2 all with scapular depression throughout  07/24/23: Pulleys - flexion, scaption, abduction, extension  Manual therapy: elongation and release to all areas needled and scapular mobs  Trigger Point Dry-Needling  Treatment instructions: Expect mild to moderate muscle soreness. S/S of pneumothorax if dry needled over a lung field, and to seek immediate medical attention should they occur. Patient verbalized understanding of these instructions and education.  Patient Consent Given: Yes Education handout provided: Previously provided Muscles treated: Rt pectoral near axilla, Rt upper traps, rhomboids and lats, posterior deltoid  Electrical stimulation performed: No Parameters: N/A Treatment response/outcome: twitch, referral of ache into entire pectoral and into upper shoulder  DATE:  07/21/2022 Performed SOZO screen at pt request Showed UT and levator scap  stretch and pt performed x 2-3 ea. Will start doing at home [facility] mobs gr 3/4 posterior, inferior, SL scapular mobs all directions STM to right pecs, UT, lats, and SL to scapular area with cupping to Lats and scapular region Supine wand flexion and scaption x 4 with 4 lb wt PROM Right shoulder with contract-relax flexion, scaption, ER Serratus on wall x 5 for scapular mobility Discussed frozen shoulder;pt could see ortho, may benefit from muscle relaxers or antiinflamatories  07/18/23: Pulleys - flexion, scaption, abduction, extension, functional IR in standing Standing T at counter for Rt shoulder flexion, reach Rt UE into horizontal adduction on counter for lat bias stretch Manual therapy: stripping and elongation of lat, pectoral, deltoid, bicep, Rt scapular mobs with movement in Lt SL, P/ROM abduction, ER, IR with joint mobs at end range - tacking down pectorals and lat distal to proximal to get to joint mob level Trigger Point Dry-Needling  Treatment instructions: Expect mild to moderate muscle soreness. S/S of pneumothorax if dry needled over a lung field, and to seek immediate medical attention should they occur. Patient verbalized understanding of these instructions and education.  Patient Consent Given: Yes Education handout provided: Previously provided Muscles treated: Rt pectoral near axilla Electrical stimulation performed: No Parameters: N/A Treatment response/outcome: twitch, referral of ache into entire pectoral and into upper shoulder   PATIENT EDUCATION:  Education details: [email or web address], Discussed POC, DN mobilizations, STM, PROM, strength, decrease UT compensation Person educated: Patient Education method: Explanation Education comprehension: verbalized understanding  HOME EXERCISE PROGRAM: Access Code: [email or web address] URL: [email or web address] Date: 05/28/2023 Prepared by: [name] Beuhring  Exercises - Seated Levator Scapulae Stretch  - 1 x daily - 7 x weekly -  1 sets - 2 reps - 20 hold - Seated Upper Trapezius Stretch  - 1 x daily - 7 x weekly - 1 sets - 2 reps - 20 hold - Shoulder Lat Pull Down with Resistance  - 1 x daily - 7 x weekly - 2 sets - 10 reps - Low Trap Setting at Wall  - 1 x daily - 7 x weekly - 3 sets - 5 reps - Shoulder External Rotation with Anchored Resistance  - 1 x daily - 7 x weekly - 2 sets - 10 reps - Standing Shoulder Internal Rotation with Anchored Resistance  - 1 x daily - 7 x weekly - 2 sets - 10 reps  ASSESSMENT:  CLINICAL IMPRESSION: Pt reports new UT and rhomboid stretches have been very beneficial in minimizing the overall upper quadrant tightness from her current frozen shoulder. She also reported good relief for about 1-2 days after last session so continued cupping and focused on multiple trigger points in Rt UT in prone and shoulder mobs. Pt reported some relief in UT tightness by end of session. She had to leave session early so ended at 845.   OBJECTIVE IMPAIRMENTS: decreased knowledge of condition, decreased ROM, decreased strength, increased fascial restrictions, impaired flexibility, impaired UE functional use, postural dysfunction, and pain.   ACTIVITY LIMITATIONS: lifting, sleeping, and reach over head  PARTICIPATION LIMITATIONS:  [place] dancing, working out  PERSONAL FACTORS: 1-2 comorbidities: Right breast cancer s/p chemotherapy and radiation  are also affecting patient's functional outcome.   REHAB POTENTIAL: Good  CLINICAL DECISION MAKING: Stable/uncomplicated  EVALUATION COMPLEXITY: Low  GOALS: Goals reviewed with patient? Yes  SHORT TERM GOALS: Target date: 05/28/2023  Independent with HEP for Right shoulder  ROM, strength/stabilization Baseline: Goal status:ONGOING  2.  Decreased pain in Right UT/pecs/ medial arm by 25 % Baseline:  Goal status: MET 6/25  3.  Shoulder flexion and abd improved by 10-12 degrees for improved reaching Baseline:  Goal status: MET Improved shoulder flexion  and abd by 15-20 degrees  4.  Improved right scapulo -humeral rhythm and with decreased UT compensation Baseline:  Goal status: ONGOING   LONG TERM GOALS: Target date: 08/22/23  Right UT/pec/arm pain improved by atleast 50%  Baseline: 06/18/23 - 40% improvement at this time ; Goal status: PROGRESSING  2.  Quick dash no greater than 20%for improved function Baseline:  Goal status: INITIAL  3.   Able to perform [place] dancing with correct arm position without increased pain/compensation Baseline:  Goal status: ongoing   4.    Improved right scapulo -humeral rhythm and with decreased UT compensation Baseline: 06/18/23 - pt feels scapulo - humeral rhythm improved about 30% with Rt shoulder A/ROM Goal status: ONGOING   5.  Pt will have improved ability to sleep on the right side without increased pain. Baseline:  Goal status: ongoing still awakens with pain  PLAN:  PT FREQUENCY: 2x/week  PT DURATION: 8 weeks  PLANNED INTERVENTIONS: Therapeutic exercises, Therapeutic activity, Neuromuscular re-education, Patient/Family education, Self Care, Joint mobilization, Dry Needling, Manual lymph drainage, scar mobilization, Ionotophoresis 4mg /ml Dexamethasone, Manual therapy, and Re-evaluation  PLAN FOR NEXT SESSION: assess response to DN, joint mobs at end range, stripping and elongation to pecs, lats, subscapularis, bicep, continue DN and scapular mobs, pulleys, review HEP progression, progress scapular training and mechanics as tol,  lower trap strengthening to decrease compensation, functional training  Lorrene Reid, PT 07/30/23 9:16 AM

## 2023-08-01 ENCOUNTER — Encounter: Payer: Self-pay | Admitting: Physical Therapy

## 2023-08-01 ENCOUNTER — Ambulatory Visit: Payer: 59 | Admitting: Physical Therapy

## 2023-08-06 ENCOUNTER — Ambulatory Visit: Payer: 59

## 2023-08-06 ENCOUNTER — Inpatient Hospital Stay: Payer: 59 | Attending: Hematology and Oncology

## 2023-08-06 VITALS — BP 114/84 | HR 99 | Temp 98.4°F | Resp 16

## 2023-08-06 DIAGNOSIS — Z483 Aftercare following surgery for neoplasm: Secondary | ICD-10-CM

## 2023-08-06 DIAGNOSIS — Z79811 Long term (current) use of aromatase inhibitors: Secondary | ICD-10-CM | POA: Diagnosis not present

## 2023-08-06 DIAGNOSIS — Z170 Estrogen receptor positive status [ER+]: Secondary | ICD-10-CM | POA: Insufficient documentation

## 2023-08-06 DIAGNOSIS — M25611 Stiffness of right shoulder, not elsewhere classified: Secondary | ICD-10-CM

## 2023-08-06 DIAGNOSIS — R293 Abnormal posture: Secondary | ICD-10-CM

## 2023-08-06 DIAGNOSIS — Z5111 Encounter for antineoplastic chemotherapy: Secondary | ICD-10-CM | POA: Insufficient documentation

## 2023-08-06 DIAGNOSIS — I890 Lymphedema, not elsewhere classified: Secondary | ICD-10-CM

## 2023-08-06 DIAGNOSIS — C50811 Malignant neoplasm of overlapping sites of right female breast: Secondary | ICD-10-CM | POA: Insufficient documentation

## 2023-08-06 MED ORDER — GOSERELIN ACETATE 3.6 MG ~~LOC~~ IMPL
3.6000 mg | DRUG_IMPLANT | SUBCUTANEOUS | Status: DC
  Administered 2023-08-06: 3.6 mg via SUBCUTANEOUS
  Filled 2023-08-06: qty 3.6

## 2023-08-06 NOTE — Therapy (Signed)
OUTPATIENT PHYSICAL THERAPY  UPPER EXTREMITY ONCOLOGY TREATMENT  Patient Name: SAMIAH CULLEN MRN: [id number] DOB:02-03-1976, 47 y.o., female Today's Date: 08/06/2023  END OF SESSION:  PT End of Session - 08/06/23 0805     Visit Number 19    Number of Visits 32    Date for PT Re-Evaluation 08/22/23    PT Start Time 0803    PT Stop Time 0851   pt had to leave at 850   PT Time Calculation (min) 48 min    Activity Tolerance Patient tolerated treatment well;Patient limited by pain    Behavior During Therapy [facility] for tasks assessed/performed                   Past Medical History:  Diagnosis Date   Diabetes mellitus without complication (HCC)    Type 2   Family history of brain tumor    Family history of skin cancer    Gestational diabetes    Headache    otc med prn   Heartburn in pregnancy    Incompetent cervix in pregnancy 04/2015   Missed abortion    no surgery required   Personal history of chemotherapy    Personal history of radiation therapy    Postpartum care following cesarean delivery (11/2) 10/12/2015   right breast ca 06/2021   UTI in pregnancy 03/2015   recent UTI, treatment completed   Past Surgical History:  Procedure Laterality Date   APPENDECTOMY  12/10/2004   BREAST BIOPSY Right 2022   BREAST BIOPSY Left 2022   BREAST RECONSTRUCTION WITH PLACEMENT OF TISSUE EXPANDER AND ALLODERM Right 07/25/2021   Procedure: RIGHT BREAST RECONSTRUCTION WITH PLACEMENT OF TISSUE EXPANDER AND ALLODERM;  Surgeon: Glenna Fellows, MD;  Location: [facility];  Service: Plastics;  Laterality: Right;   CERCLAGE LAPAROSCOPIC ABDOMINAL N/A 04/15/2015   Procedure: LAPAROSCOPIC TRANSABDOMINAL CERVCOISTHMIC CERCLAGE;  Surgeon: Fermin Schwab, MD;  Location: [facility];  Service: Gynecology;  Laterality: N/A;   CERVICAL CERCLAGE  02/08/2011   vaginal   CESAREAN SECTION N/A 10/12/2015   Procedure: CESAREAN SECTION;  Surgeon: Genia Del, MD;  Location: [facility];  Service: Obstetrics;  Laterality: N/A;   CESAREAN SECTION N/A 03/03/2018   Procedure: Repeat CESAREAN SECTION/Removal of Abdominal Cerclage;  Surgeon: Shea Evans, MD;  Location: [facility];  Service: Obstetrics;  Laterality: N/A;  EDD: 03/28/18   MASTECTOMY Right 2022   MASTECTOMY W/ SENTINEL NODE BIOPSY Right 07/25/2021   Procedure: RIGHT MASTECTOMY WITH AXILLARY SENTINEL LYMPH NODE BIOPSY;  Surgeon: Emelia Loron, MD;  Location: [facility];  Service: General;  Laterality: Right;   PORTACATH PLACEMENT N/A 08/24/2021   Procedure: INSERTION PORT-A-CATH;  Surgeon: Emelia Loron, MD;  Location: [facility];  Service: General;  Laterality: N/A;   RADIOACTIVE SEED GUIDED AXILLARY SENTINEL LYMPH NODE Right 07/25/2021   Procedure: RADIOACTIVE SEED GUIDED RIGHT AXILLARY NODE EXCISION;  Surgeon: Emelia Loron, MD;  Location: [facility];  Service: General;  Laterality: Right;   TISSUE EXPANDER PLACEMENT Right 12/04/2021   Procedure: REMOVAL RIGHT CHEST TISSUE EXPANDER;  Surgeon: Glenna Fellows, MD;  Location: [facility];  Service: Plastics;  Laterality: Right;   WISDOM TOOTH EXTRACTION  12/11/1999   Patient Active Problem List   Diagnosis Date Noted   Lower leg mass, left 05/02/2022   Port-A-Cath in place 08/25/2021   S/P mastectomy, right 07/25/2021   Genetic testing 07/01/2021   Family history of skin cancer 06/22/2021   Family history  of brain tumor 06/22/2021   Malignant neoplasm of overlapping sites of right breast in female, estrogen receptor positive (HCC) 06/21/2021   Status post repeat low transverse cesarean section 03/03/2018   DM (diabetes mellitus) in pregnancy 03/02/2018   Incompetent cervix in pregnancy 03/02/2018    PCP: Dr. Adrian Prince, MD  REFERRING PROVIDER: [name], MD  REFERRING DIAG: S/p Right Mastectomy  THERAPY DIAG:  Stiffness of right shoulder, not elsewhere classified  Abnormal  posture  Malignant neoplasm of overlapping sites of right breast in female, estrogen receptor positive [facility])  Aftercare following surgery for neoplasm  Lymphedema, not elsewhere classified  ONSET DATE: April 22,2024  Rationale for Evaluation and Treatment: Rehabilitation  SUBJECTIVE:                                                                                                                                                                                           SUBJECTIVE STATEMENT:  I need to make an appt with Terrilee Files, MD. My Rt shoulder hurts at night and wakes me up. I just know I'm dealing with frozen shoulder.   PERTINENT HISTORY:  Pt is s/p Right Mastectomy with Tissue Expander and 4+/7 LN on 07/25/21. She is s/p 6 months of chemo followed by radiation. She had emergency surgery to remove tissue expander due to infection on 12/04/2021. She completed radiation on 04/04/22. She will be having right breast reconstruction on 06/24/2023.  PAIN:  Are you having pain? Yes, 5-6/10 at constant ache in Rt shoulder  PRECAUTIONS: Right lymphedema risk  WEIGHT BEARING RESTRICTIONS: No  FALLS:  Has patient fallen in last 6 months? No  LIVING ENVIRONMENT: Lives with: husband and 2 children Lives in: House/apartment Stairs: Yes; Internal: 14 steps; can reach both and External: 0 steps; none   OCCUPATION: Administrative Asst, works from home  LEISURE: walking, work out with trainer,  HAND DOMINANCE: right   PRIOR LEVEL OF FUNCTION: Independent  PATIENT GOALS: Get my mobility back, decrease pain   OBJECTIVE:  COGNITION: Overall cognitive status: Within functional limits for tasks assessed   PALPATION:  7/18: 1/6 GH joint mobility with inferior, posterior, anterior glides  Very tender right UT, supraspinatus  OBSERVATIONS / OTHER ASSESSMENTS:  7/18:   Eval: Right UT compensation, decreased right scapular mobility with shoulder ROM  SENSATION: Light touch:  Appears intact    POSTURE: forward head, rounded shoulders, trunk rotation  UPPER EXTREMITY AROM/PROM:  A/PROM RIGHT   eval  RIGHT 06/04/23 RIGHT 06/18/23 RIGHT 7/18  Shoulder extension 25 (pain ant. shoulder 32 35 (pain ant shoulder) 25  Shoulder flexion 132, UT compensation 125 improved mechanics 97  118  Shoulder abduction 123, tight UT/arm  118 with UT pain 85  Shoulder internal rotation    Functional IR to mid gluteal before compensation with UT to gain more ROM  Shoulder external rotation        (Blank rows = not tested)  A/PROM LEFT   eval  Shoulder extension 41  Shoulder flexion 153  Shoulder abduction 178  Shoulder internal rotation 72  Shoulder external rotation 95    (Blank rows = not tested)  CERVICAL AROM:WNL except tightness noted in bilateral UT  UPPER EXTREMITY STRENGTH:  Middle and lower trap Rt 3-/5  LYMPHEDEMA ASSESSMENTS:   SURGERY TYPE/DATE: 07/25/2021 Right Mastectomy with SLNB and tissue expander, Tissue expander removed 12/04/21  NUMBER OF LYMPH NODES REMOVED: 4/7  CHEMOTHERAPY: YES, 08/25/21-01/19/22  RADIATION:YES, 02/26/22-04/04/2022  HORMONE TREATMENT: YES  INFECTIONS: Yes with need for tissue expander to be removed   LYMPHEDEMA ASSESSMENTS:   LANDMARK RIGHT  eval  At axilla  32.4  15 cm proximal to olecranon process   10 cm proximal to olecranon process 25.6  Olecranon process 24.7  15 cm proximal to ulnar styloid process   10 cm proximal to ulnar styloid process 21.0  Just proximal to ulnar styloid process 15.3  Across hand at thumb web space 19.1  At base of 2nd digit 5.8  (Blank rows = not tested)  LANDMARK LEFT  eval  At axilla  29  15 cm proximal to olecranon process   10 cm proximal to olecranon process 24.2  Olecranon process 24.3  15 cm proximal to ulnar styloid process   10 cm proximal to ulnar styloid process 20.3  Just proximal to ulnar styloid process 14.6  Across hand at thumb web space 18.8  At base of 2nd  digit 5.9  (Blank rows = not tested)    GAIT: WNL  L-DEX LYMPHEDEMA SCREENING: The patient was assessed using the L-Dex machine today to produce a lymphedema index baseline score. The patient will be reassessed on a regular basis (typically every 3 months) to obtain new L-Dex scores. If the score is > 6.5 points away from his/her baseline score indicating onset of subclinical lymphedema, it will be recommended to wear a compression garment for 4 weeks, 12 hours per day and then be reassessed. If the score continues to be > 6.5 points from baseline at reassessment, we will initiate lymphedema treatment. Assessing in this manner has a 95% rate of preventing clinically significant lymphedema. 04/30/2023 SCORE TODAY -1.7  QUICK DASH SURVEY: 61%   TODAY'S TREATMENT:     08/06/23: STM in prone with cocoa butter for cupping to Rt UT and superior scapular region over multiple trigger points  P/ROM as able mostly into flexion, but some scaption and D2 all with scapular depression throughout [facility] mobs gr 3/4 posterior to Rt shoulder Attempted MFR to cording pt feels in her medial upper arm but unable to get her arm into enough of a stretch to stretch the cording  07/30/23: Manual Therapy GH mobs gr 3/4 posterior to Rt shoulder STM in prone with cocoa butter for cupping to Rt UT and superior scapular region over multiple trigger points  P/ROM as able mostly into flexion, but some scaption and D2 all with scapular depression throughout  07/24/23: Pulleys - flexion, scaption, abduction, extension  Manual therapy: elongation and release to all areas needled and scapular mobs  Trigger Point Dry-Needling  Treatment instructions: Expect mild to moderate muscle soreness. S/S of pneumothorax if dry needled  over a lung field, and to seek immediate medical attention should they occur. Patient verbalized understanding of these instructions and education.  Patient Consent Given: Yes Education handout provided:  Previously provided Muscles treated: Rt pectoral near axilla, Rt upper traps, rhomboids and lats, posterior deltoid  Electrical stimulation performed: No Parameters: N/A Treatment response/outcome: twitch, referral of ache into entire pectoral and into upper shoulder                                                                                                                                     DATE:  07/21/2022 Performed SOZO screen at pt request Showed UT and levator scap stretch and pt performed x 2-3 ea. Will start doing at home [facility] mobs gr 3/4 posterior, inferior, SL scapular mobs all directions STM to right pecs, UT, lats, and SL to scapular area with cupping to Lats and scapular region Supine wand flexion and scaption x 4 with 4 lb wt PROM Right shoulder with contract-relax flexion, scaption, ER Serratus on wall x 5 for scapular mobility Discussed frozen shoulder;pt could see ortho, may benefit from muscle relaxers or antiinflamatories  07/18/23: Pulleys - flexion, scaption, abduction, extension, functional IR in standing Standing T at counter for Rt shoulder flexion, reach Rt UE into horizontal adduction on counter for lat bias stretch Manual therapy: stripping and elongation of lat, pectoral, deltoid, bicep, Rt scapular mobs with movement in Lt SL, P/ROM abduction, ER, IR with joint mobs at end range - tacking down pectorals and lat distal to proximal to get to joint mob level Trigger Point Dry-Needling  Treatment instructions: Expect mild to moderate muscle soreness. S/S of pneumothorax if dry needled over a lung field, and to seek immediate medical attention should they occur. Patient verbalized understanding of these instructions and education.  Patient Consent Given: Yes Education handout provided: Previously provided Muscles treated: Rt pectoral near axilla Electrical stimulation performed: No Parameters: N/A Treatment response/outcome: twitch, referral of ache into entire  pectoral and into upper shoulder   PATIENT EDUCATION:  Education details: [email or web address], Discussed POC, DN mobilizations, STM, PROM, strength, decrease UT compensation Person educated: Patient Education method: Explanation Education comprehension: verbalized understanding  HOME EXERCISE PROGRAM: Access Code: [email or web address] URL: [email or web address] Date: 05/28/2023 Prepared by: [name] Beuhring  Exercises - Seated Levator Scapulae Stretch  - 1 x daily - 7 x weekly - 1 sets - 2 reps - 20 hold - Seated Upper Trapezius Stretch  - 1 x daily - 7 x weekly - 1 sets - 2 reps - 20 hold - Shoulder Lat Pull Down with Resistance  - 1 x daily - 7 x weekly - 2 sets - 10 reps - Low Trap Setting at Wall  - 1 x daily - 7 x weekly - 3 sets - 5 reps - Shoulder External Rotation with Anchored Resistance  - 1 x daily - 7 x weekly - 2 sets - 10 reps -  Standing Shoulder Internal Rotation with Anchored Resistance  - 1 x daily - 7 x weekly - 2 sets - 10 reps  ASSESSMENT:  CLINICAL IMPRESSION: Pt reports the cupping has been beneficial so continued with that today. Her Rt shoulder A/ROM was improved some and less painful for pt by end of session. She reports she plans to make an appt with orthopedist Terrilee Files, MD soon to see if there is anything else that can be done for her Rt shoulder.   OBJECTIVE IMPAIRMENTS: decreased knowledge of condition, decreased ROM, decreased strength, increased fascial restrictions, impaired flexibility, impaired UE functional use, postural dysfunction, and pain.   ACTIVITY LIMITATIONS: lifting, sleeping, and reach over head  PARTICIPATION LIMITATIONS:  [place] dancing, working out  PERSONAL FACTORS: 1-2 comorbidities: Right breast cancer s/p chemotherapy and radiation  are also affecting patient's functional outcome.   REHAB POTENTIAL: Good  CLINICAL DECISION MAKING: Stable/uncomplicated  EVALUATION COMPLEXITY: Low  GOALS: Goals reviewed with patient? Yes  SHORT  TERM GOALS: Target date: 05/28/2023  Independent with HEP for Right shoulder ROM, strength/stabilization Baseline: Goal status:ONGOING  2.  Decreased pain in Right UT/pecs/ medial arm by 25 % Baseline:  Goal status: MET 6/25  3.  Shoulder flexion and abd improved by 10-12 degrees for improved reaching Baseline:  Goal status: MET Improved shoulder flexion and abd by 15-20 degrees  4.  Improved right scapulo -humeral rhythm and with decreased UT compensation Baseline:  Goal status: ONGOING   LONG TERM GOALS: Target date: 08/22/23  Right UT/pec/arm pain improved by atleast 50%  Baseline: 06/18/23 - 40% improvement at this time ; Goal status: PROGRESSING  2.  Quick dash no greater than 20%for improved function Baseline:  Goal status: INITIAL  3.   Able to perform [place] dancing with correct arm position without increased pain/compensation Baseline:  Goal status: ongoing   4.    Improved right scapulo -humeral rhythm and with decreased UT compensation Baseline: 06/18/23 - pt feels scapulo - humeral rhythm improved about 30% with Rt shoulder A/ROM Goal status: ONGOING   5.  Pt will have improved ability to sleep on the right side without increased pain. Baseline:  Goal status: ongoing still awakens with pain  PLAN:  PT FREQUENCY: 2x/week  PT DURATION: 8 weeks  PLANNED INTERVENTIONS: Therapeutic exercises, Therapeutic activity, Neuromuscular re-education, Patient/Family education, Self Care, Joint mobilization, Dry Needling, Manual lymph drainage, scar mobilization, Ionotophoresis 4mg /ml Dexamethasone, Manual therapy, and Re-evaluation  PLAN FOR NEXT SESSION: assess response to DN, joint mobs at end range, stripping and elongation to pecs, lats, subscapularis, bicep, continue DN and scapular mobs, pulleys, review HEP progression, progress scapular training and mechanics as tol,  lower trap strengthening to decrease compensation, functional training  Berna Spare,  PTA 08/06/23 8:58 AM

## 2023-08-08 ENCOUNTER — Encounter: Payer: Self-pay | Admitting: Physical Therapy

## 2023-08-08 ENCOUNTER — Ambulatory Visit: Payer: 59 | Admitting: Physical Therapy

## 2023-08-08 DIAGNOSIS — M25611 Stiffness of right shoulder, not elsewhere classified: Secondary | ICD-10-CM

## 2023-08-08 DIAGNOSIS — R293 Abnormal posture: Secondary | ICD-10-CM

## 2023-08-08 NOTE — Therapy (Signed)
OUTPATIENT PHYSICAL THERAPY  UPPER EXTREMITY ONCOLOGY TREATMENT  Patient Name: Allison Reed MRN: [id number] DOB:1976/03/10, 47 y.o., female Today's Date: 08/08/2023  END OF SESSION:  PT End of Session - 08/08/23 0806     Visit Number 20    Number of Visits 32    Date for PT Re-Evaluation 08/22/23    PT Start Time 0807    PT Stop Time 0846    PT Time Calculation (min) 39 min    Activity Tolerance Patient tolerated treatment well;Patient limited by pain    Behavior During Therapy [facility] for tasks assessed/performed                    Past Medical History:  Diagnosis Date   Diabetes mellitus without complication (HCC)    Type 2   Family history of brain tumor    Family history of skin cancer    Gestational diabetes    Headache    otc med prn   Heartburn in pregnancy    Incompetent cervix in pregnancy 04/2015   Missed abortion    no surgery required   Personal history of chemotherapy    Personal history of radiation therapy    Postpartum care following cesarean delivery (11/2) 10/12/2015   right breast ca 06/2021   UTI in pregnancy 03/2015   recent UTI, treatment completed   Past Surgical History:  Procedure Laterality Date   APPENDECTOMY  12/10/2004   BREAST BIOPSY Right 2022   BREAST BIOPSY Left 2022   BREAST RECONSTRUCTION WITH PLACEMENT OF TISSUE EXPANDER AND ALLODERM Right 07/25/2021   Procedure: RIGHT BREAST RECONSTRUCTION WITH PLACEMENT OF TISSUE EXPANDER AND ALLODERM;  Surgeon: Glenna Fellows, MD;  Location: [facility];  Service: Plastics;  Laterality: Right;   CERCLAGE LAPAROSCOPIC ABDOMINAL N/A 04/15/2015   Procedure: LAPAROSCOPIC TRANSABDOMINAL CERVCOISTHMIC CERCLAGE;  Surgeon: Fermin Schwab, MD;  Location: [facility];  Service: Gynecology;  Laterality: N/A;   CERVICAL CERCLAGE  02/08/2011   vaginal   CESAREAN SECTION N/A 10/12/2015   Procedure: CESAREAN SECTION;  Surgeon: Genia Del, MD;  Location: [facility];  Service:  Obstetrics;  Laterality: N/A;   CESAREAN SECTION N/A 03/03/2018   Procedure: Repeat CESAREAN SECTION/Removal of Abdominal Cerclage;  Surgeon: Shea Evans, MD;  Location: [facility];  Service: Obstetrics;  Laterality: N/A;  EDD: 03/28/18   MASTECTOMY Right 2022   MASTECTOMY W/ SENTINEL NODE BIOPSY Right 07/25/2021   Procedure: RIGHT MASTECTOMY WITH AXILLARY SENTINEL LYMPH NODE BIOPSY;  Surgeon: Emelia Loron, MD;  Location: [facility];  Service: General;  Laterality: Right;   PORTACATH PLACEMENT N/A 08/24/2021   Procedure: INSERTION PORT-A-CATH;  Surgeon: Emelia Loron, MD;  Location: [facility];  Service: General;  Laterality: N/A;   RADIOACTIVE SEED GUIDED AXILLARY SENTINEL LYMPH NODE Right 07/25/2021   Procedure: RADIOACTIVE SEED GUIDED RIGHT AXILLARY NODE EXCISION;  Surgeon: Emelia Loron, MD;  Location: [facility];  Service: General;  Laterality: Right;   TISSUE EXPANDER PLACEMENT Right 12/04/2021   Procedure: REMOVAL RIGHT CHEST TISSUE EXPANDER;  Surgeon: Glenna Fellows, MD;  Location: [facility];  Service: Plastics;  Laterality: Right;   WISDOM TOOTH EXTRACTION  12/11/1999   Patient Active Problem List   Diagnosis Date Noted   Lower leg mass, left 05/02/2022   Port-A-Cath in place 08/25/2021   S/P mastectomy, right 07/25/2021   Genetic testing 07/01/2021   Family history of skin cancer 06/22/2021   Family history of brain tumor 06/22/2021  Malignant neoplasm of overlapping sites of right breast in female, estrogen receptor positive (HCC) 06/21/2021   Status post repeat low transverse cesarean section 03/03/2018   DM (diabetes mellitus) in pregnancy 03/02/2018   Incompetent cervix in pregnancy 03/02/2018    PCP: Dr. Adrian Prince, MD  REFERRING PROVIDER: [name], MD  REFERRING DIAG: S/p Right Mastectomy  THERAPY DIAG:  Stiffness of right shoulder, not elsewhere classified  Abnormal posture  ONSET  DATE: April 22,2024  Rationale for Evaluation and Treatment: Rehabilitation  SUBJECTIVE:                                                                                                                                                                                           SUBJECTIVE STATEMENT:  I need to make an appt with Terrilee Files, MD. I slept 4 hours without pain. My shoulder is still very limited but I started jogging and it feels like it helps loosen me up.  PERTINENT HISTORY:  Pt is s/p Right Mastectomy with Tissue Expander and 4+/7 LN on 07/25/21. She is s/p 6 months of chemo followed by radiation. She had emergency surgery to remove tissue expander due to infection on 12/04/2021. She completed radiation on 04/04/22. She will be having right breast reconstruction on 06/24/2023.  PAIN:  Are you having pain? Yes, 5-6/10 at constant ache in Rt shoulder  PRECAUTIONS: Right lymphedema risk  WEIGHT BEARING RESTRICTIONS: No  FALLS:  Has patient fallen in last 6 months? No  LIVING ENVIRONMENT: Lives with: husband and 2 children Lives in: House/apartment Stairs: Yes; Internal: 14 steps; can reach both and External: 0 steps; none   OCCUPATION: Administrative Asst, works from home  LEISURE: walking, work out with trainer,  HAND DOMINANCE: right   PRIOR LEVEL OF FUNCTION: Independent  PATIENT GOALS: Get my mobility back, decrease pain   OBJECTIVE:  COGNITION: Overall cognitive status: Within functional limits for tasks assessed   PALPATION:  7/18: 1/6 GH joint mobility with inferior, posterior, anterior glides  Very tender right UT, supraspinatus  OBSERVATIONS / OTHER ASSESSMENTS:  7/18:   Eval: Right UT compensation, decreased right scapular mobility with shoulder ROM  SENSATION: Light touch: Appears intact    POSTURE: forward head, rounded shoulders, trunk rotation  UPPER EXTREMITY AROM/PROM:  A/PROM RIGHT   eval  RIGHT 06/04/23 RIGHT 06/18/23 RIGHT 7/18   Shoulder extension 25 (pain ant. shoulder 32 35 (pain ant shoulder) 25  Shoulder flexion 132, UT compensation 125 improved mechanics 97 118  Shoulder abduction 123, tight UT/arm  118 with UT pain 85  Shoulder internal rotation    Functional IR to mid gluteal before  compensation with UT to gain more ROM  Shoulder external rotation        (Blank rows = not tested)  A/PROM LEFT   eval  Shoulder extension 41  Shoulder flexion 153  Shoulder abduction 178  Shoulder internal rotation 72  Shoulder external rotation 95    (Blank rows = not tested)  CERVICAL AROM:WNL except tightness noted in bilateral UT  UPPER EXTREMITY STRENGTH:  Middle and lower trap Rt 3-/5  LYMPHEDEMA ASSESSMENTS:   SURGERY TYPE/DATE: 07/25/2021 Right Mastectomy with SLNB and tissue expander, Tissue expander removed 12/04/21  NUMBER OF LYMPH NODES REMOVED: 4/7  CHEMOTHERAPY: YES, 08/25/21-01/19/22  RADIATION:YES, 02/26/22-04/04/2022  HORMONE TREATMENT: YES  INFECTIONS: Yes with need for tissue expander to be removed   LYMPHEDEMA ASSESSMENTS:   LANDMARK RIGHT  eval  At axilla  32.4  15 cm proximal to olecranon process   10 cm proximal to olecranon process 25.6  Olecranon process 24.7  15 cm proximal to ulnar styloid process   10 cm proximal to ulnar styloid process 21.0  Just proximal to ulnar styloid process 15.3  Across hand at thumb web space 19.1  At base of 2nd digit 5.8  (Blank rows = not tested)  LANDMARK LEFT  eval  At axilla  29  15 cm proximal to olecranon process   10 cm proximal to olecranon process 24.2  Olecranon process 24.3  15 cm proximal to ulnar styloid process   10 cm proximal to ulnar styloid process 20.3  Just proximal to ulnar styloid process 14.6  Across hand at thumb web space 18.8  At base of 2nd digit 5.9  (Blank rows = not tested)    GAIT: WNL  L-DEX LYMPHEDEMA SCREENING: The patient was assessed using the L-Dex machine today to produce a lymphedema index  baseline score. The patient will be reassessed on a regular basis (typically every 3 months) to obtain new L-Dex scores. If the score is > 6.5 points away from his/her baseline score indicating onset of subclinical lymphedema, it will be recommended to wear a compression garment for 4 weeks, 12 hours per day and then be reassessed. If the score continues to be > 6.5 points from baseline at reassessment, we will initiate lymphedema treatment. Assessing in this manner has a 95% rate of preventing clinically significant lymphedema. 04/30/2023 SCORE TODAY -1.7  QUICK DASH SURVEY: 61%   TODAY'S TREATMENT:     08/08/23: Rt UE 6lb weighted pendulums 10x circles bil, flex/ext, abd/add Thoracic ext with pec stretch over firm foam roller in supine x10 Open book into Lt rot laying with firm foam roller under lat for pressure release Sleeper stretch in Rt SL with scapula protracted IR/ER 10x5" holds Supine bil UE AA/ROM flexion with dowel + 7.5lb ankle weight on dowel Diagonals with dowel Rt D2 10x5" holds in horiz add and horiz abd along diagonal  Standing T at counter with thumbs toward ceiling x 10 Standing shoulder rolls fwd/bwd x10 Lacrosse ball and theracane TP release against wall: Rt pectorals, Rt intrascapular Seated on Rt hand Rt upper trap and levator stretch x30" each Horiz abduction stretch grabbing wall and turning trunk away Rt 2x20" Standing bil shoulder ext holding long towel, then add trunk flexion x 10 Ellipitcal x 3' L3 incline 5 with exaggerated arms  08/06/23: STM in prone with cocoa butter for cupping to Rt UT and superior scapular region over multiple trigger points  P/ROM as able mostly into flexion, but some scaption and D2 all with  scapular depression throughout [facility] mobs gr 3/4 posterior to Rt shoulder Attempted MFR to cording pt feels in her medial upper arm but unable to get her arm into enough of a stretch to stretch the cording  07/30/23: Manual Therapy GH mobs gr 3/4  posterior to Rt shoulder STM in prone with cocoa butter for cupping to Rt UT and superior scapular region over multiple trigger points  P/ROM as able mostly into flexion, but some scaption and D2 all with scapular depression throughout                                                                                                                                     PATIENT EDUCATION:  Education details: [email or web address], Discussed POC, DN mobilizations, STM, PROM, strength, decrease UT compensation Person educated: Patient Education method: Explanation Education comprehension: verbalized understanding  HOME EXERCISE PROGRAM: Access Code: [email or web address] URL: [email or web address] Date: 05/28/2023 Prepared by: [name] Mina Babula  Exercises - Seated Levator Scapulae Stretch  - 1 x daily - 7 x weekly - 1 sets - 2 reps - 20 hold - Seated Upper Trapezius Stretch  - 1 x daily - 7 x weekly - 1 sets - 2 reps - 20 hold - Shoulder Lat Pull Down with Resistance  - 1 x daily - 7 x weekly - 2 sets - 10 reps - Low Trap Setting at Wall  - 1 x daily - 7 x weekly - 3 sets - 5 reps - Shoulder External Rotation with Anchored Resistance  - 1 x daily - 7 x weekly - 2 sets - 10 reps - Standing Shoulder Internal Rotation with Anchored Resistance  - 1 x daily - 7 x weekly - 2 sets - 10 reps  ASSESSMENT:  CLINICAL IMPRESSION: Pt continues to maintain gains to date but has stalled in making further ROM progress.  Discussed how the DN helps to address the muscular tension and pain but the primary focus needs to be on aggressive ROM and stretching of the shoulder.  Pt tends to rush through ROM so PT cued for her to engage in the stretch with each rep at end range for longer holds.  Pt Rt shoulder abd is limited to approx 80 deg active and passive.  She has added running into mix which helps open up scapular region.  She plans to return to Dr. Katrinka Blazing for further advice.   OBJECTIVE IMPAIRMENTS: decreased knowledge of  condition, decreased ROM, decreased strength, increased fascial restrictions, impaired flexibility, impaired UE functional use, postural dysfunction, and pain.   ACTIVITY LIMITATIONS: lifting, sleeping, and reach over head  PARTICIPATION LIMITATIONS:  [place] dancing, working out  PERSONAL FACTORS: 1-2 comorbidities: Right breast cancer s/p chemotherapy and radiation  are also affecting patient's functional outcome.   REHAB POTENTIAL: Good  CLINICAL DECISION MAKING: Stable/uncomplicated  EVALUATION COMPLEXITY: Low  GOALS: Goals reviewed with patient? Yes  SHORT TERM GOALS: Target date: 05/28/2023  Independent  with HEP for Right shoulder ROM, strength/stabilization Baseline: Goal status:ONGOING  2.  Decreased pain in Right UT/pecs/ medial arm by 25 % Baseline:  Goal status: MET 6/25  3.  Shoulder flexion and abd improved by 10-12 degrees for improved reaching Baseline:  Goal status: MET Improved shoulder flexion and abd by 15-20 degrees  4.  Improved right scapulo -humeral rhythm and with decreased UT compensation Baseline:  Goal status: ONGOING   LONG TERM GOALS: Target date: 08/22/23  Right UT/pec/arm pain improved by atleast 50%  Baseline: 06/18/23 - 40% improvement at this time ; Goal status: PROGRESSING  2.  Quick dash no greater than 20%for improved function Baseline:  Goal status: INITIAL  3.   Able to perform [place] dancing with correct arm position without increased pain/compensation Baseline:  Goal status: ongoing   4.    Improved right scapulo -humeral rhythm and with decreased UT compensation Baseline: 06/18/23 - pt feels scapulo - humeral rhythm improved about 30% with Rt shoulder A/ROM Goal status: ONGOING   5.  Pt will have improved ability to sleep on the right side without increased pain. Baseline:  Goal status: ongoing still awakens with pain  PLAN:  PT FREQUENCY: 2x/week  PT DURATION: 8 weeks  PLANNED INTERVENTIONS: Therapeutic exercises,  Therapeutic activity, Neuromuscular re-education, Patient/Family education, Self Care, Joint mobilization, Dry Needling, Manual lymph drainage, scar mobilization, Ionotophoresis 4mg /ml Dexamethasone, Manual therapy, and Re-evaluation  PLAN FOR NEXT SESSION: next visit see if ortho PT avail to DN Rt upper trap, joint mobs at end range, stripping and elongation to pecs, lats, subscapularis, bicep, continue DN and scapular mobs, pulleys, review HEP progression, progress scapular training and mechanics as tol,  lower trap strengthening to decrease compensation, functional training  Danika Kluender, PT 08/08/23 8:47 AM

## 2023-08-12 ENCOUNTER — Other Ambulatory Visit: Payer: Self-pay | Admitting: Adult Health

## 2023-08-12 DIAGNOSIS — C50811 Malignant neoplasm of overlapping sites of right female breast: Secondary | ICD-10-CM

## 2023-08-13 ENCOUNTER — Ambulatory Visit: Payer: 59 | Attending: Hematology and Oncology

## 2023-08-13 DIAGNOSIS — R293 Abnormal posture: Secondary | ICD-10-CM | POA: Diagnosis present

## 2023-08-13 DIAGNOSIS — M25611 Stiffness of right shoulder, not elsewhere classified: Secondary | ICD-10-CM | POA: Insufficient documentation

## 2023-08-13 NOTE — Therapy (Signed)
OUTPATIENT PHYSICAL THERAPY  UPPER EXTREMITY ONCOLOGY TREATMENT  Patient Name: Allison Reed MRN: [id number] DOB:06-08-1976, 47 y.o., female Today's Date: 08/13/2023  END OF SESSION:  PT End of Session - 08/13/23 0804     Visit Number 21    Number of Visits 32    Date for PT Re-Evaluation 08/22/23    PT Start Time 0801    PT Stop Time 0858    PT Time Calculation (min) 57 min    Activity Tolerance Patient tolerated treatment well;Patient limited by pain    Behavior During Therapy [facility] for tasks assessed/performed                    Past Medical History:  Diagnosis Date   Diabetes mellitus without complication (HCC)    Type 2   Family history of brain tumor    Family history of skin cancer    Gestational diabetes    Headache    otc med prn   Heartburn in pregnancy    Incompetent cervix in pregnancy 04/2015   Missed abortion    no surgery required   Personal history of chemotherapy    Personal history of radiation therapy    Postpartum care following cesarean delivery (11/2) 10/12/2015   right breast ca 06/2021   UTI in pregnancy 03/2015   recent UTI, treatment completed   Past Surgical History:  Procedure Laterality Date   APPENDECTOMY  12/10/2004   BREAST BIOPSY Right 2022   BREAST BIOPSY Left 2022   BREAST RECONSTRUCTION WITH PLACEMENT OF TISSUE EXPANDER AND ALLODERM Right 07/25/2021   Procedure: RIGHT BREAST RECONSTRUCTION WITH PLACEMENT OF TISSUE EXPANDER AND ALLODERM;  Surgeon: Glenna Fellows, MD;  Location: [facility];  Service: Plastics;  Laterality: Right;   CERCLAGE LAPAROSCOPIC ABDOMINAL N/A 04/15/2015   Procedure: LAPAROSCOPIC TRANSABDOMINAL CERVCOISTHMIC CERCLAGE;  Surgeon: Fermin Schwab, MD;  Location: [facility];  Service: Gynecology;  Laterality: N/A;   CERVICAL CERCLAGE  02/08/2011   vaginal   CESAREAN SECTION N/A 10/12/2015   Procedure: CESAREAN SECTION;  Surgeon: Genia Del, MD;  Location: [facility];  Service:  Obstetrics;  Laterality: N/A;   CESAREAN SECTION N/A 03/03/2018   Procedure: Repeat CESAREAN SECTION/Removal of Abdominal Cerclage;  Surgeon: Shea Evans, MD;  Location: [facility];  Service: Obstetrics;  Laterality: N/A;  EDD: 03/28/18   MASTECTOMY Right 2022   MASTECTOMY W/ SENTINEL NODE BIOPSY Right 07/25/2021   Procedure: RIGHT MASTECTOMY WITH AXILLARY SENTINEL LYMPH NODE BIOPSY;  Surgeon: Emelia Loron, MD;  Location: [facility];  Service: General;  Laterality: Right;   PORTACATH PLACEMENT N/A 08/24/2021   Procedure: INSERTION PORT-A-CATH;  Surgeon: Emelia Loron, MD;  Location: [facility];  Service: General;  Laterality: N/A;   RADIOACTIVE SEED GUIDED AXILLARY SENTINEL LYMPH NODE Right 07/25/2021   Procedure: RADIOACTIVE SEED GUIDED RIGHT AXILLARY NODE EXCISION;  Surgeon: Emelia Loron, MD;  Location: [facility];  Service: General;  Laterality: Right;   TISSUE EXPANDER PLACEMENT Right 12/04/2021   Procedure: REMOVAL RIGHT CHEST TISSUE EXPANDER;  Surgeon: Glenna Fellows, MD;  Location: [facility];  Service: Plastics;  Laterality: Right;   WISDOM TOOTH EXTRACTION  12/11/1999   Patient Active Problem List   Diagnosis Date Noted   Lower leg mass, left 05/02/2022   Port-A-Cath in place 08/25/2021   S/P mastectomy, right 07/25/2021   Genetic testing 07/01/2021   Family history of skin cancer 06/22/2021   Family history of brain tumor 06/22/2021  Malignant neoplasm of overlapping sites of right breast in female, estrogen receptor positive (HCC) 06/21/2021   Status post repeat low transverse cesarean section 03/03/2018   DM (diabetes mellitus) in pregnancy 03/02/2018   Incompetent cervix in pregnancy 03/02/2018    PCP: Dr. Adrian Prince, MD  REFERRING PROVIDER: [name], MD  REFERRING DIAG: S/p Right Mastectomy  THERAPY DIAG:  Stiffness of right shoulder, not elsewhere classified  Abnormal posture  ONSET  DATE: April 22,2024  Rationale for Evaluation and Treatment: Rehabilitation  SUBJECTIVE:                                                                                                                                                                                           SUBJECTIVE STATEMENT:  I made an appt with [facility] for 9/26 and they put me on a wait list in case an earlier appt opens up. I hope someone can DN me today because I am just miserable with pain.   PERTINENT HISTORY:  Pt is s/p Right Mastectomy with Tissue Expander and 4+/7 LN on 07/25/21. She is s/p 6 months of chemo followed by radiation. She had emergency surgery to remove tissue expander due to infection on 12/04/2021. She completed radiation on 04/04/22. She will be having right breast reconstruction on 06/24/2023.  PAIN:  Are you having pain? Yes, 9/10 at constant ache in Rt shoulder  PRECAUTIONS: Right lymphedema risk  WEIGHT BEARING RESTRICTIONS: No  FALLS:  Has patient fallen in last 6 months? No  LIVING ENVIRONMENT: Lives with: husband and 2 children Lives in: House/apartment Stairs: Yes; Internal: 14 steps; can reach both and External: 0 steps; none   OCCUPATION: Administrative Asst, works from home  LEISURE: walking, work out with trainer,  HAND DOMINANCE: right   PRIOR LEVEL OF FUNCTION: Independent  PATIENT GOALS: Get my mobility back, decrease pain   OBJECTIVE:  COGNITION: Overall cognitive status: Within functional limits for tasks assessed   PALPATION:  7/18: 1/6 GH joint mobility with inferior, posterior, anterior glides  Very tender right UT, supraspinatus  OBSERVATIONS / OTHER ASSESSMENTS:  7/18:   Eval: Right UT compensation, decreased right scapular mobility with shoulder ROM  SENSATION: Light touch: Appears intact    POSTURE: forward head, rounded shoulders, trunk rotation  UPPER EXTREMITY AROM/PROM:  A/PROM RIGHT   eval  RIGHT 06/04/23 RIGHT 06/18/23  RIGHT 7/18  Shoulder extension 25 (pain ant. shoulder 32 35 (pain ant shoulder) 25  Shoulder flexion 132, UT compensation 125 improved mechanics 97 118  Shoulder abduction 123, tight UT/arm  118 with UT pain 85  Shoulder internal rotation    Functional IR to  mid gluteal before compensation with UT to gain more ROM  Shoulder external rotation        (Blank rows = not tested)  A/PROM LEFT   eval  Shoulder extension 41  Shoulder flexion 153  Shoulder abduction 178  Shoulder internal rotation 72  Shoulder external rotation 95    (Blank rows = not tested)  CERVICAL AROM:WNL except tightness noted in bilateral UT  UPPER EXTREMITY STRENGTH:  Middle and lower trap Rt 3-/5  LYMPHEDEMA ASSESSMENTS:   SURGERY TYPE/DATE: 07/25/2021 Right Mastectomy with SLNB and tissue expander, Tissue expander removed 12/04/21  NUMBER OF LYMPH NODES REMOVED: 4/7  CHEMOTHERAPY: YES, 08/25/21-01/19/22  RADIATION:YES, 02/26/22-04/04/2022  HORMONE TREATMENT: YES  INFECTIONS: Yes with need for tissue expander to be removed   LYMPHEDEMA ASSESSMENTS:   LANDMARK RIGHT  eval  At axilla  32.4  15 cm proximal to olecranon process   10 cm proximal to olecranon process 25.6  Olecranon process 24.7  15 cm proximal to ulnar styloid process   10 cm proximal to ulnar styloid process 21.0  Just proximal to ulnar styloid process 15.3  Across hand at thumb web space 19.1  At base of 2nd digit 5.8  (Blank rows = not tested)  LANDMARK LEFT  eval  At axilla  29  15 cm proximal to olecranon process   10 cm proximal to olecranon process 24.2  Olecranon process 24.3  15 cm proximal to ulnar styloid process   10 cm proximal to ulnar styloid process 20.3  Just proximal to ulnar styloid process 14.6  Across hand at thumb web space 18.8  At base of 2nd digit 5.9  (Blank rows = not tested)    GAIT: WNL  L-DEX LYMPHEDEMA SCREENING: The patient was assessed using the L-Dex machine today to produce a lymphedema  index baseline score. The patient will be reassessed on a regular basis (typically every 3 months) to obtain new L-Dex scores. If the score is > 6.5 points away from his/her baseline score indicating onset of subclinical lymphedema, it will be recommended to wear a compression garment for 4 weeks, 12 hours per day and then be reassessed. If the score continues to be > 6.5 points from baseline at reassessment, we will initiate lymphedema treatment. Assessing in this manner has a 95% rate of preventing clinically significant lymphedema. 04/30/2023 SCORE TODAY -1.7  QUICK DASH SURVEY: 61%   TODAY'S TREATMENT:     08/13/23: Manual Therapy STM in prone with cocoa butter with and without cupping to Rt UT and superior scapular region over multiple trigger points  Scap mobs when prone to Rt scap into protraction and retraction. This very tight and immobile initially ubt this did loosen up  P/ROM as able mostly into flexion, but some scaption and IR/er all with scapular depression throughout as able GH mobs gr 3/4 posterior and inferior to Rt shoulder MFR to cording in medial upper arm, was able to get her into flexion enough today that skin puckering from cording was visible  08/08/23: Rt UE 6lb weighted pendulums 10x circles bil, flex/ext, abd/add Thoracic ext with pec stretch over firm foam roller in supine x10 Open book into Lt rot laying with firm foam roller under lat for pressure release Sleeper stretch in Rt SL with scapula protracted IR/ER 10x5" holds Supine bil UE AA/ROM flexion with dowel + 7.5lb ankle weight on dowel Diagonals with dowel Rt D2 10x5" holds in horiz add and horiz abd along diagonal  Standing T at counter  with thumbs toward ceiling x 10 Standing shoulder rolls fwd/bwd x10 Lacrosse ball and theracane TP release against wall: Rt pectorals, Rt intrascapular Seated on Rt hand Rt upper trap and levator stretch x30" each Horiz abduction stretch grabbing wall and turning trunk away Rt  2x20" Standing bil shoulder ext holding long towel, then add trunk flexion x 10 Ellipitcal x 3' L3 incline 5 with exaggerated arms  08/06/23: STM in prone with cocoa butter for cupping to Rt UT and superior scapular region over multiple trigger points  P/ROM as able mostly into flexion, but some scaption and D2 all with scapular depression throughout [facility] mobs gr 3/4 posterior to Rt shoulder Attempted MFR to cording pt feels in her medial upper arm but unable to get her arm into enough of a stretch to stretch the cording  07/30/23: Manual Therapy GH mobs gr 3/4 posterior to Rt shoulder STM in prone with cocoa butter for cupping to Rt UT and superior scapular region over multiple trigger points  P/ROM as able mostly into flexion, but some scaption and D2 all with scapular depression throughout                                                                                                                                     PATIENT EDUCATION:  Education details: [email or web address], Discussed POC, DN mobilizations, STM, PROM, strength, decrease UT compensation Person educated: Patient Education method: Explanation Education comprehension: verbalized understanding  HOME EXERCISE PROGRAM: Access Code: [email or web address] URL: [email or web address] Date: 05/28/2023 Prepared by: [name] Beuhring  Exercises - Seated Levator Scapulae Stretch  - 1 x daily - 7 x weekly - 1 sets - 2 reps - 20 hold - Seated Upper Trapezius Stretch  - 1 x daily - 7 x weekly - 1 sets - 2 reps - 20 hold - Shoulder Lat Pull Down with Resistance  - 1 x daily - 7 x weekly - 2 sets - 10 reps - Low Trap Setting at Wall  - 1 x daily - 7 x weekly - 3 sets - 5 reps - Shoulder External Rotation with Anchored Resistance  - 1 x daily - 7 x weekly - 2 sets - 10 reps - Standing Shoulder Internal Rotation with Anchored Resistance  - 1 x daily - 7 x weekly - 2 sets - 10 reps  ASSESSMENT:  CLINICAL IMPRESSION: Pt has scheduled an  appointment with Dr. Katrinka Blazing for 9/26 and is on their wait list if something opens up sooner. 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 Also included aggressive P/ROM into flexion and abd with stabilizing scapula during to limit compensations. Pts P/ROM is much improved after each session. She is able to don her shirt with less pain and improved A/ROM. She reports this relief generally lasts up to 2 days after each session allowing her to be more aggressive with  her stretching at home between sessions.   OBJECTIVE IMPAIRMENTS: decreased knowledge of condition, decreased ROM, decreased strength, increased fascial restrictions, impaired flexibility, impaired UE functional use, postural dysfunction, and pain.   ACTIVITY LIMITATIONS: lifting, sleeping, and reach over head  PARTICIPATION LIMITATIONS:  [place] dancing, working out  PERSONAL FACTORS: 1-2 comorbidities: Right breast cancer s/p chemotherapy and radiation  are also affecting patient's functional outcome.   REHAB POTENTIAL: Good  CLINICAL DECISION MAKING: Stable/uncomplicated  EVALUATION COMPLEXITY: Low  GOALS: Goals reviewed with patient? Yes  SHORT TERM GOALS: Target date: 05/28/2023  Independent with HEP for Right shoulder ROM, strength/stabilization Baseline: Goal status:ONGOING  2.  Decreased pain in Right UT/pecs/ medial arm by 25 % Baseline:  Goal status: MET 6/25  3.  Shoulder flexion and abd improved by 10-12 degrees for improved reaching Baseline:  Goal status: MET Improved shoulder flexion and abd by 15-20 degrees  4.  Improved right scapulo -humeral rhythm and with decreased UT compensation Baseline:  Goal status: ONGOING   LONG TERM GOALS: Target date: 08/22/23  Right UT/pec/arm pain improved by atleast 50%  Baseline: 06/18/23 - 40% improvement at this time ; Goal status: PROGRESSING  2.  Quick dash no greater than 20%for  improved function Baseline:  Goal status: INITIAL  3.   Able to perform [place] dancing with correct arm position without increased pain/compensation Baseline:  Goal status: ongoing   4.    Improved right scapulo -humeral rhythm and with decreased UT compensation Baseline: 06/18/23 - pt feels scapulo - humeral rhythm improved about 30% with Rt shoulder A/ROM Goal status: ONGOING   5.  Pt will have improved ability to sleep on the right side without increased pain. Baseline:  Goal status: ongoing still awakens with pain  PLAN:  PT FREQUENCY: 2x/week  PT DURATION: 8 weeks  PLANNED INTERVENTIONS: Therapeutic exercises, Therapeutic activity, Neuromuscular re-education, Patient/Family education, Self Care, Joint mobilization, Dry Needling, Manual lymph drainage, scar mobilization, Ionotophoresis 4mg /ml Dexamethasone, Manual therapy, and Re-evaluation  PLAN FOR NEXT SESSION: Cont DN, joint mobs at end range, stripping and elongation to pecs, lats, subscapularis, bicep, continue DN and scapular mobs, pulleys, review HEP progression, progress scapular training and mechanics as tol,  lower trap strengthening to decrease compensation, functional training  Berna Spare, PTA 08/13/23 9:06 AM

## 2023-08-15 ENCOUNTER — Ambulatory Visit: Payer: 59 | Admitting: Physical Therapy

## 2023-08-15 ENCOUNTER — Encounter: Payer: Self-pay | Admitting: Physical Therapy

## 2023-08-15 DIAGNOSIS — R293 Abnormal posture: Secondary | ICD-10-CM

## 2023-08-15 DIAGNOSIS — M25611 Stiffness of right shoulder, not elsewhere classified: Secondary | ICD-10-CM | POA: Diagnosis not present

## 2023-08-15 NOTE — Therapy (Signed)
OUTPATIENT PHYSICAL THERAPY  UPPER EXTREMITY ONCOLOGY TREATMENT  Patient Name: Allison Reed MRN: [id number] DOB:Dec 03, 1976, 47 y.o., female Today's Date: 08/15/2023  END OF SESSION:  PT End of Session - 08/15/23 0756     Visit Number 22    Number of Visits 32    Date for PT Re-Evaluation 08/22/23    PT Start Time 0757    PT Stop Time 0842    PT Time Calculation (min) 45 min    Activity Tolerance Patient tolerated treatment well;Patient limited by pain    Behavior During Therapy [facility] for tasks assessed/performed                     Past Medical History:  Diagnosis Date   Diabetes mellitus without complication (HCC)    Type 2   Family history of brain tumor    Family history of skin cancer    Gestational diabetes    Headache    otc med prn   Heartburn in pregnancy    Incompetent cervix in pregnancy 04/2015   Missed abortion    no surgery required   Personal history of chemotherapy    Personal history of radiation therapy    Postpartum care following cesarean delivery (11/2) 10/12/2015   right breast ca 06/2021   UTI in pregnancy 03/2015   recent UTI, treatment completed   Past Surgical History:  Procedure Laterality Date   APPENDECTOMY  12/10/2004   BREAST BIOPSY Right 2022   BREAST BIOPSY Left 2022   BREAST RECONSTRUCTION WITH PLACEMENT OF TISSUE EXPANDER AND ALLODERM Right 07/25/2021   Procedure: RIGHT BREAST RECONSTRUCTION WITH PLACEMENT OF TISSUE EXPANDER AND ALLODERM;  Surgeon: Glenna Fellows, MD;  Location: [facility];  Service: Plastics;  Laterality: Right;   CERCLAGE LAPAROSCOPIC ABDOMINAL N/A 04/15/2015   Procedure: LAPAROSCOPIC TRANSABDOMINAL CERVCOISTHMIC CERCLAGE;  Surgeon: Fermin Schwab, MD;  Location: [facility];  Service: Gynecology;  Laterality: N/A;   CERVICAL CERCLAGE  02/08/2011   vaginal   CESAREAN SECTION N/A 10/12/2015   Procedure: CESAREAN SECTION;  Surgeon: Genia Del, MD;  Location: [facility];  Service:  Obstetrics;  Laterality: N/A;   CESAREAN SECTION N/A 03/03/2018   Procedure: Repeat CESAREAN SECTION/Removal of Abdominal Cerclage;  Surgeon: Shea Evans, MD;  Location: [facility];  Service: Obstetrics;  Laterality: N/A;  EDD: 03/28/18   MASTECTOMY Right 2022   MASTECTOMY W/ SENTINEL NODE BIOPSY Right 07/25/2021   Procedure: RIGHT MASTECTOMY WITH AXILLARY SENTINEL LYMPH NODE BIOPSY;  Surgeon: Emelia Loron, MD;  Location: [facility];  Service: General;  Laterality: Right;   PORTACATH PLACEMENT N/A 08/24/2021   Procedure: INSERTION PORT-A-CATH;  Surgeon: Emelia Loron, MD;  Location: [facility];  Service: General;  Laterality: N/A;   RADIOACTIVE SEED GUIDED AXILLARY SENTINEL LYMPH NODE Right 07/25/2021   Procedure: RADIOACTIVE SEED GUIDED RIGHT AXILLARY NODE EXCISION;  Surgeon: Emelia Loron, MD;  Location: [facility];  Service: General;  Laterality: Right;   TISSUE EXPANDER PLACEMENT Right 12/04/2021   Procedure: REMOVAL RIGHT CHEST TISSUE EXPANDER;  Surgeon: Glenna Fellows, MD;  Location: [facility];  Service: Plastics;  Laterality: Right;   WISDOM TOOTH EXTRACTION  12/11/1999   Patient Active Problem List   Diagnosis Date Noted   Lower leg mass, left 05/02/2022   Port-A-Cath in place 08/25/2021   S/P mastectomy, right 07/25/2021   Genetic testing 07/01/2021   Family history of skin cancer 06/22/2021   Family history of brain tumor 06/22/2021  Malignant neoplasm of overlapping sites of right breast in female, estrogen receptor positive (HCC) 06/21/2021   Status post repeat low transverse cesarean section 03/03/2018   DM (diabetes mellitus) in pregnancy 03/02/2018   Incompetent cervix in pregnancy 03/02/2018    PCP: Dr. Adrian Prince, MD  REFERRING PROVIDER: [name], MD  REFERRING DIAG: S/p Right Mastectomy  THERAPY DIAG:  Stiffness of right shoulder, not elsewhere classified  Abnormal posture  ONSET  DATE: April 22,2024  Rationale for Evaluation and Treatment: Rehabilitation  SUBJECTIVE:                                                                                                                                                                                           SUBJECTIVE STATEMENT:  The manual work from last time really helped me feel better and released the shoulder.  The foam roller to my lat is very painful but helpful.  I can do it up to 5 min.  The pulleys are helping too.   I see Dr. Katrinka Blazing on 9/19 but hoping for something earlier.   PERTINENT HISTORY:  Pt is s/p Right Mastectomy with Tissue Expander and 4+/7 LN on 07/25/21. She is s/p 6 months of chemo followed by radiation. She had emergency surgery to remove tissue expander due to infection on 12/04/2021. She completed radiation on 04/04/22. She will be having right breast reconstruction on 06/24/2023.  PAIN:  Are you having pain? Yes, /10 at constant ache in Rt shoulder  PRECAUTIONS: Right lymphedema risk  WEIGHT BEARING RESTRICTIONS: No  FALLS:  Has patient fallen in last 6 months? No  LIVING ENVIRONMENT: Lives with: husband and 2 children Lives in: House/apartment Stairs: Yes; Internal: 14 steps; can reach both and External: 0 steps; none   OCCUPATION: Administrative Asst, works from home  LEISURE: walking, work out with trainer,  HAND DOMINANCE: right   PRIOR LEVEL OF FUNCTION: Independent  PATIENT GOALS: Get my mobility back, decrease pain   OBJECTIVE:  COGNITION: Overall cognitive status: Within functional limits for tasks assessed   PALPATION:  7/18: 1/6 GH joint mobility with inferior, posterior, anterior glides  Very tender right UT, supraspinatus  OBSERVATIONS / OTHER ASSESSMENTS:  7/18:   Eval: Right UT compensation, decreased right scapular mobility with shoulder ROM  SENSATION: Light touch: Appears intact    POSTURE: forward head, rounded shoulders, trunk rotation  UPPER  EXTREMITY AROM/PROM:  A/PROM RIGHT   eval  RIGHT 06/04/23 RIGHT 06/18/23 RIGHT 7/18  Shoulder extension 25 (pain ant. shoulder 32 35 (pain ant shoulder) 25  Shoulder flexion 132, UT compensation 125 improved mechanics 97 118  Shoulder abduction 123,  tight UT/arm  118 with UT pain 85  Shoulder internal rotation    Functional IR to mid gluteal before compensation with UT to gain more ROM  Shoulder external rotation        (Blank rows = not tested)  A/PROM LEFT   eval  Shoulder extension 41  Shoulder flexion 153  Shoulder abduction 178  Shoulder internal rotation 72  Shoulder external rotation 95    (Blank rows = not tested)  CERVICAL AROM:WNL except tightness noted in bilateral UT  UPPER EXTREMITY STRENGTH:  Middle and lower trap Rt 3-/5  LYMPHEDEMA ASSESSMENTS:   SURGERY TYPE/DATE: 07/25/2021 Right Mastectomy with SLNB and tissue expander, Tissue expander removed 12/04/21  NUMBER OF LYMPH NODES REMOVED: 4/7  CHEMOTHERAPY: YES, 08/25/21-01/19/22  RADIATION:YES, 02/26/22-04/04/2022  HORMONE TREATMENT: YES  INFECTIONS: Yes with need for tissue expander to be removed   LYMPHEDEMA ASSESSMENTS:   LANDMARK RIGHT  eval  At axilla  32.4  15 cm proximal to olecranon process   10 cm proximal to olecranon process 25.6  Olecranon process 24.7  15 cm proximal to ulnar styloid process   10 cm proximal to ulnar styloid process 21.0  Just proximal to ulnar styloid process 15.3  Across hand at thumb web space 19.1  At base of 2nd digit 5.8  (Blank rows = not tested)  LANDMARK LEFT  eval  At axilla  29  15 cm proximal to olecranon process   10 cm proximal to olecranon process 24.2  Olecranon process 24.3  15 cm proximal to ulnar styloid process   10 cm proximal to ulnar styloid process 20.3  Just proximal to ulnar styloid process 14.6  Across hand at thumb web space 18.8  At base of 2nd digit 5.9  (Blank rows = not tested)    GAIT: WNL  L-DEX LYMPHEDEMA SCREENING: The  patient was assessed using the L-Dex machine today to produce a lymphedema index baseline score. The patient will be reassessed on a regular basis (typically every 3 months) to obtain new L-Dex scores. If the score is > 6.5 points away from his/her baseline score indicating onset of subclinical lymphedema, it will be recommended to wear a compression garment for 4 weeks, 12 hours per day and then be reassessed. If the score continues to be > 6.5 points from baseline at reassessment, we will initiate lymphedema treatment. Assessing in this manner has a 95% rate of preventing clinically significant lymphedema. 04/30/2023 SCORE TODAY -1.7  QUICK DASH SURVEY: 61%   TODAY'S TREATMENT:     08/15/23: Trigger Point Dry-Needling  Treatment instructions: Expect mild to moderate muscle soreness. S/S of pneumothorax if dry needled over a lung field, and to seek immediate medical attention should they occur. Patient verbalized understanding of these instructions and education.  Patient Consent Given: Yes Education handout provided: Previously provided Muscles treated: Rt lat, upper trap, infraspinatus, teres minor/major Electrical stimulation performed: No Parameters: N/A Treatment response/outcome: signif twitch, release and ROM of Rt shoulder  Supine manual therapy with full elbow flexion positioning to target shoulder stretching, mobs, and contract/relax of lat into flexion, IR, ER Standing shoulder pulleys all directions Rt SL Rt shoulder IR/ER end range stretching with self-overpressure  08/13/23: Manual Therapy STM in prone with cocoa butter with and without cupping to Rt UT and superior scapular region over multiple trigger points  Scap mobs when prone to Rt scap into protraction and retraction. This very tight and immobile initially ubt this did loosen up  P/ROM as able  mostly into flexion, but some scaption and IR/er all with scapular depression throughout as able GH mobs gr 3/4 posterior and  inferior to Rt shoulder MFR to cording in medial upper arm, was able to get her into flexion enough today that skin puckering from cording was visible  08/08/23: Rt UE 6lb weighted pendulums 10x circles bil, flex/ext, abd/add Thoracic ext with pec stretch over firm foam roller in supine x10 Open book into Lt rot laying with firm foam roller under lat for pressure release Sleeper stretch in Rt SL with scapula protracted IR/ER 10x5" holds Supine bil UE AA/ROM flexion with dowel + 7.5lb ankle weight on dowel Diagonals with dowel Rt D2 10x5" holds in horiz add and horiz abd along diagonal  Standing T at counter with thumbs toward ceiling x 10 Standing shoulder rolls fwd/bwd x10 Lacrosse ball and theracane TP release against wall: Rt pectorals, Rt intrascapular Seated on Rt hand Rt upper trap and levator stretch x30" each Horiz abduction stretch grabbing wall and turning trunk away Rt 2x20" Standing bil shoulder ext holding long towel, then add trunk flexion x 10 Ellipitcal x 3' L3 incline 5 with exaggerated arms  08/06/23: STM in prone with cocoa butter for cupping to Rt UT and superior scapular region over multiple trigger points  P/ROM as able mostly into flexion, but some scaption and D2 all with scapular depression throughout GH mobs gr 3/4 posterior to Rt shoulder Attempted MFR to cording pt feels in her medial upper arm but unable to get her arm into enough of a stretch to stretch the cording                                                                                                                                     PATIENT EDUCATION:  Education details: [email or web address], Discussed POC, DN mobilizations, STM, PROM, strength, decrease UT compensation Person educated: Patient Education method: Explanation Education comprehension: verbalized understanding  HOME EXERCISE PROGRAM: Access Code: [email or web address] URL: [email or web address] Date: 05/28/2023 Prepared by: [name]  Cinch Ormond  Exercises - Seated Levator Scapulae Stretch  - 1 x daily - 7 x weekly - 1 sets - 2 reps - 20 hold - Seated Upper Trapezius Stretch  - 1 x daily - 7 x weekly - 1 sets - 2 reps - 20 hold - Shoulder Lat Pull Down with Resistance  - 1 x daily - 7 x weekly - 2 sets - 10 reps - Low Trap Setting at Wall  - 1 x daily - 7 x weekly - 3 sets - 5 reps - Shoulder External Rotation with Anchored Resistance  - 1 x daily - 7 x weekly - 2 sets - 10 reps - Standing Shoulder Internal Rotation with Anchored Resistance  - 1 x daily - 7 x weekly - 2 sets - 10 reps  ASSESSMENT:  CLINICAL IMPRESSION: Pt is compliant with pulleys, foam roller release and stretching with overpressure.  She has two sources limiting her Rt shoulder ROM: capsular restriction and cording.  She has reactive spasms in shoulder girdle muscles which are a secondary source of pain which improve with DN and other manual techniques.  She gains more range of motion within sessions but may benefit from more frequent sessions for improved lasting carry over of progress.  PT used elbow flexion positioning with mobs and contract relax to slacken cording restriction today to focus more on shoulder capsule stretching.  OBJECTIVE IMPAIRMENTS: decreased knowledge of condition, decreased ROM, decreased strength, increased fascial restrictions, impaired flexibility, impaired UE functional use, postural dysfunction, and pain.   ACTIVITY LIMITATIONS: lifting, sleeping, and reach over head  PARTICIPATION LIMITATIONS:  [place] dancing, working out  PERSONAL FACTORS: 1-2 comorbidities: Right breast cancer s/p chemotherapy and radiation  are also affecting patient's functional outcome.   REHAB POTENTIAL: Good  CLINICAL DECISION MAKING: Stable/uncomplicated  EVALUATION COMPLEXITY: Low  GOALS: Goals reviewed with patient? Yes  SHORT TERM GOALS: Target date: 05/28/2023  Independent with HEP for Right shoulder ROM,  strength/stabilization Baseline: Goal status:ONGOING  2.  Decreased pain in Right UT/pecs/ medial arm by 25 % Baseline:  Goal status: MET 6/25  3.  Shoulder flexion and abd improved by 10-12 degrees for improved reaching Baseline:  Goal status: MET Improved shoulder flexion and abd by 15-20 degrees  4.  Improved right scapulo -humeral rhythm and with decreased UT compensation Baseline:  Goal status: ONGOING   LONG TERM GOALS: Target date: 08/22/23  Right UT/pec/arm pain improved by atleast 50%  Baseline: 06/18/23 - 40% improvement at this time ; Goal status: PROGRESSING  2.  Quick dash no greater than 20%for improved function Baseline:  Goal status: INITIAL  3.   Able to perform [place] dancing with correct arm position without increased pain/compensation Baseline:  Goal status: ongoing   4.    Improved right scapulo -humeral rhythm and with decreased UT compensation Baseline: 06/18/23 - pt feels scapulo - humeral rhythm improved about 30% with Rt shoulder A/ROM Goal status: ONGOING   5.  Pt will have improved ability to sleep on the right side without increased pain. Baseline:  Goal status: ongoing still awakens with pain  PLAN:  PT FREQUENCY: 2x/week  PT DURATION: 8 weeks  PLANNED INTERVENTIONS: Therapeutic exercises, Therapeutic activity, Neuromuscular re-education, Patient/Family education, Self Care, Joint mobilization, Dry Needling, Manual lymph drainage, scar mobilization, Ionotophoresis 4mg /ml Dexamethasone, Manual therapy, and Re-evaluation  PLAN FOR NEXT SESSION: discuss ideal POC for upcoming ERO (d/c or extend and what frequency), Cont DN, joint mobs at end range, stripping and elongation to pecs, lats, subscapularis, bicep, continue DN and scapular mobs, pulleys, review HEP progression, progress scapular training and mechanics as tol,  lower trap strengthening to decrease compensation, functional training  Johndaniel Catlin, PT 08/15/23 8:46  AM

## 2023-08-20 ENCOUNTER — Ambulatory Visit: Payer: 59

## 2023-08-20 DIAGNOSIS — M25611 Stiffness of right shoulder, not elsewhere classified: Secondary | ICD-10-CM | POA: Diagnosis not present

## 2023-08-20 DIAGNOSIS — R293 Abnormal posture: Secondary | ICD-10-CM

## 2023-08-20 NOTE — Therapy (Signed)
OUTPATIENT PHYSICAL THERAPY  UPPER EXTREMITY ONCOLOGY TREATMENT  Patient Name: Allison Reed MRN: [id number] DOB:1976/01/05, 47 y.o., female Today's Date: 08/20/2023  END OF SESSION:  PT End of Session - 08/20/23 0815     Visit Number 23    Number of Visits 32    Date for PT Re-Evaluation 08/22/23    PT Start Time 0809   pt arrived late   PT Stop Time 0858    PT Time Calculation (min) 49 min    Activity Tolerance Patient tolerated treatment well;Patient limited by pain    Behavior During Therapy [facility] for tasks assessed/performed                     Past Medical History:  Diagnosis Date   Diabetes mellitus without complication (HCC)    Type 2   Family history of brain tumor    Family history of skin cancer    Gestational diabetes    Headache    otc med prn   Heartburn in pregnancy    Incompetent cervix in pregnancy 04/2015   Missed abortion    no surgery required   Personal history of chemotherapy    Personal history of radiation therapy    Postpartum care following cesarean delivery (11/2) 10/12/2015   right breast ca 06/2021   UTI in pregnancy 03/2015   recent UTI, treatment completed   Past Surgical History:  Procedure Laterality Date   APPENDECTOMY  12/10/2004   BREAST BIOPSY Right 2022   BREAST BIOPSY Left 2022   BREAST RECONSTRUCTION WITH PLACEMENT OF TISSUE EXPANDER AND ALLODERM Right 07/25/2021   Procedure: RIGHT BREAST RECONSTRUCTION WITH PLACEMENT OF TISSUE EXPANDER AND ALLODERM;  Surgeon: Glenna Fellows, MD;  Location: [facility];  Service: Plastics;  Laterality: Right;   CERCLAGE LAPAROSCOPIC ABDOMINAL N/A 04/15/2015   Procedure: LAPAROSCOPIC TRANSABDOMINAL CERVCOISTHMIC CERCLAGE;  Surgeon: Fermin Schwab, MD;  Location: [facility];  Service: Gynecology;  Laterality: N/A;   CERVICAL CERCLAGE  02/08/2011   vaginal   CESAREAN SECTION N/A 10/12/2015   Procedure: CESAREAN SECTION;  Surgeon: Genia Del, MD;  Location: [facility];  Service: Obstetrics;  Laterality: N/A;   CESAREAN SECTION N/A 03/03/2018   Procedure: Repeat CESAREAN SECTION/Removal of Abdominal Cerclage;  Surgeon: Shea Evans, MD;  Location: [facility];  Service: Obstetrics;  Laterality: N/A;  EDD: 03/28/18   MASTECTOMY Right 2022   MASTECTOMY W/ SENTINEL NODE BIOPSY Right 07/25/2021   Procedure: RIGHT MASTECTOMY WITH AXILLARY SENTINEL LYMPH NODE BIOPSY;  Surgeon: Emelia Loron, MD;  Location: [facility];  Service: General;  Laterality: Right;   PORTACATH PLACEMENT N/A 08/24/2021   Procedure: INSERTION PORT-A-CATH;  Surgeon: Emelia Loron, MD;  Location: [facility];  Service: General;  Laterality: N/A;   RADIOACTIVE SEED GUIDED AXILLARY SENTINEL LYMPH NODE Right 07/25/2021   Procedure: RADIOACTIVE SEED GUIDED RIGHT AXILLARY NODE EXCISION;  Surgeon: Emelia Loron, MD;  Location: [facility];  Service: General;  Laterality: Right;   TISSUE EXPANDER PLACEMENT Right 12/04/2021   Procedure: REMOVAL RIGHT CHEST TISSUE EXPANDER;  Surgeon: Glenna Fellows, MD;  Location: [facility];  Service: Plastics;  Laterality: Right;   WISDOM TOOTH EXTRACTION  12/11/1999   Patient Active Problem List   Diagnosis Date Noted   Lower leg mass, left 05/02/2022   Port-A-Cath in place 08/25/2021   S/P mastectomy, right 07/25/2021   Genetic testing 07/01/2021   Family history of skin cancer 06/22/2021   Family history of  brain tumor 06/22/2021   Malignant neoplasm of overlapping sites of right breast in female, estrogen receptor positive (HCC) 06/21/2021   Status post repeat low transverse cesarean section 03/03/2018   DM (diabetes mellitus) in pregnancy 03/02/2018   Incompetent cervix in pregnancy 03/02/2018    PCP: Dr. Adrian Prince, MD  REFERRING PROVIDER: [name], MD  REFERRING DIAG: S/p Right Mastectomy  THERAPY DIAG:  Stiffness of right shoulder, not elsewhere classified  Abnormal  posture  ONSET DATE: April 22,2024  Rationale for Evaluation and Treatment: Rehabilitation  SUBJECTIVE:                                                                                                                                                                                           SUBJECTIVE STATEMENT:  I got an appt with Terrilee Files tomorrow! I really felt better after seeing you guys last week. I think I slept on the Lt side wrong and now it's bothering me. I haven't been feeling well so I've been laying down more past few days and the Lt side just feels like a muscle sprain in my neck.   PERTINENT HISTORY:  Pt is s/p Right Mastectomy with Tissue Expander and 4+/7 LN on 07/25/21. She is s/p 6 months of chemo followed by radiation. She had emergency surgery to remove tissue expander due to infection on 12/04/2021. She completed radiation on 04/04/22. She will be having right breast reconstruction on 06/24/2023.  PAIN:  Are you having pain? Yes, 4/10 at constant ache in Rt shoulder  PRECAUTIONS: Right lymphedema risk  WEIGHT BEARING RESTRICTIONS: No  FALLS:  Has patient fallen in last 6 months? No  LIVING ENVIRONMENT: Lives with: husband and 2 children Lives in: House/apartment Stairs: Yes; Internal: 14 steps; can reach both and External: 0 steps; none   OCCUPATION: Administrative Asst, works from home  LEISURE: walking, work out with trainer,  HAND DOMINANCE: right   PRIOR LEVEL OF FUNCTION: Independent  PATIENT GOALS: Get my mobility back, decrease pain   OBJECTIVE:  COGNITION: Overall cognitive status: Within functional limits for tasks assessed   PALPATION:  7/18: 1/6 GH joint mobility with inferior, posterior, anterior glides  Very tender right UT, supraspinatus  OBSERVATIONS / OTHER ASSESSMENTS:  7/18:   Eval: Right UT compensation, decreased right scapular mobility with shoulder ROM  SENSATION: Light touch: Appears intact    POSTURE: forward  head, rounded shoulders, trunk rotation  UPPER EXTREMITY AROM/PROM:  A/PROM RIGHT   eval  RIGHT 06/04/23 RIGHT 06/18/23 RIGHT 7/18  Shoulder extension 25 (pain ant. shoulder 32 35 (pain ant shoulder) 25  Shoulder flexion 132, UT compensation  125 improved mechanics 97 118  Shoulder abduction 123, tight UT/arm  118 with UT pain 85  Shoulder internal rotation    Functional IR to mid gluteal before compensation with UT to gain more ROM  Shoulder external rotation        (Blank rows = not tested)  A/PROM LEFT   eval  Shoulder extension 41  Shoulder flexion 153  Shoulder abduction 178  Shoulder internal rotation 72  Shoulder external rotation 95    (Blank rows = not tested)  CERVICAL AROM:WNL except tightness noted in bilateral UT  UPPER EXTREMITY STRENGTH:  Middle and lower trap Rt 3-/5  LYMPHEDEMA ASSESSMENTS:   SURGERY TYPE/DATE: 07/25/2021 Right Mastectomy with SLNB and tissue expander, Tissue expander removed 12/04/21  NUMBER OF LYMPH NODES REMOVED: 4/7  CHEMOTHERAPY: YES, 08/25/21-01/19/22  RADIATION:YES, 02/26/22-04/04/2022  HORMONE TREATMENT: YES  INFECTIONS: Yes with need for tissue expander to be removed   LYMPHEDEMA ASSESSMENTS:   LANDMARK RIGHT  eval  At axilla  32.4  15 cm proximal to olecranon process   10 cm proximal to olecranon process 25.6  Olecranon process 24.7  15 cm proximal to ulnar styloid process   10 cm proximal to ulnar styloid process 21.0  Just proximal to ulnar styloid process 15.3  Across hand at thumb web space 19.1  At base of 2nd digit 5.8  (Blank rows = not tested)  LANDMARK LEFT  eval  At axilla  29  15 cm proximal to olecranon process   10 cm proximal to olecranon process 24.2  Olecranon process 24.3  15 cm proximal to ulnar styloid process   10 cm proximal to ulnar styloid process 20.3  Just proximal to ulnar styloid process 14.6  Across hand at thumb web space 18.8  At base of 2nd digit 5.9  (Blank rows = not  tested)    GAIT: WNL  L-DEX LYMPHEDEMA SCREENING: The patient was assessed using the L-Dex machine today to produce a lymphedema index baseline score. The patient will be reassessed on a regular basis (typically every 3 months) to obtain new L-Dex scores. If the score is > 6.5 points away from his/her baseline score indicating onset of subclinical lymphedema, it will be recommended to wear a compression garment for 4 weeks, 12 hours per day and then be reassessed. If the score continues to be > 6.5 points from baseline at reassessment, we will initiate lymphedema treatment. Assessing in this manner has a 95% rate of preventing clinically significant lymphedema. 04/30/2023 SCORE TODAY -1.7  QUICK DASH SURVEY: 61%   TODAY'S TREATMENT:     08/20/23: Manual Therapy P/ROM as able mostly into flexion, but some scaption and IR/er all with scapular depression throughout as able STM in prone with cocoa butter with and without cupping to Rt UT and superior scapular region over multiple trigger points; in supine for gentle cervical traction to decrease bil UT tightness Scap mobs when prone to Rt scap into protraction and retraction. Scap mobility much improved today GH mobs gr 3/4 posterior and inferior to Rt shoulder MFR to cording in medial upper arm, was again able to get her into flexion enough today that skin puckering from cording was visible  08/15/23: Trigger Point Dry-Needling  Treatment instructions: Expect mild to moderate muscle soreness. S/S of pneumothorax if dry needled over a lung field, and to seek immediate medical attention should they occur. Patient verbalized understanding of these instructions and education.  Patient Consent Given: Yes Education handout provided: Previously provided Muscles  treated: Rt lat, upper trap, infraspinatus, teres minor/major Electrical stimulation performed: No Parameters: N/A Treatment response/outcome: signif twitch, release and ROM of Rt  shoulder  Supine manual therapy with full elbow flexion positioning to target shoulder stretching, mobs, and contract/relax of lat into flexion, IR, ER Standing shoulder pulleys all directions Rt SL Rt shoulder IR/ER end range stretching with self-overpressure  08/13/23: Manual Therapy STM in prone with cocoa butter with and without cupping to Rt UT and superior scapular region over multiple trigger points  Scap mobs when prone to Rt scap into protraction and retraction. This very tight and immobile initially ubt this did loosen up  P/ROM as able mostly into flexion, but some scaption and IR/er all with scapular depression throughout as able GH mobs gr 3/4 posterior and inferior to Rt shoulder MFR to cording in medial upper arm, was able to get her into flexion enough today that skin puckering from cording was visible  08/08/23: Rt UE 6lb weighted pendulums 10x circles bil, flex/ext, abd/add Thoracic ext with pec stretch over firm foam roller in supine x10 Open book into Lt rot laying with firm foam roller under lat for pressure release Sleeper stretch in Rt SL with scapula protracted IR/ER 10x5" holds Supine bil UE AA/ROM flexion with dowel + 7.5lb ankle weight on dowel Diagonals with dowel Rt D2 10x5" holds in horiz add and horiz abd along diagonal  Standing T at counter with thumbs toward ceiling x 10 Standing shoulder rolls fwd/bwd x10 Lacrosse ball and theracane TP release against wall: Rt pectorals, Rt intrascapular Seated on Rt hand Rt upper trap and levator stretch x30" each Horiz abduction stretch grabbing wall and turning trunk away Rt 2x20" Standing bil shoulder ext holding long towel, then add trunk flexion x 10 Ellipitcal x 3' L3 incline 5 with exaggerated arms  08/06/23: STM in prone with cocoa butter for cupping to Rt UT and superior scapular region over multiple trigger points  P/ROM as able mostly into flexion, but some scaption and D2 all with scapular depression  throughout GH mobs gr 3/4 posterior to Rt shoulder Attempted MFR to cording pt feels in her medial upper arm but unable to get her arm into enough of a stretch to stretch the cording                                                                                                                                     PATIENT EDUCATION:  Education details: [email or web address], Discussed POC, DN mobilizations, STM, PROM, strength, decrease UT compensation Person educated: Patient Education method: Explanation Education comprehension: verbalized understanding  HOME EXERCISE PROGRAM: Access Code: [email or web address] URL: [email or web address] Date: 05/28/2023 Prepared by: [name] Beuhring  Exercises - Seated Levator Scapulae Stretch  - 1 x daily - 7 x weekly - 1 sets - 2 reps - 20 hold - Seated Upper Trapezius Stretch  - 1 x daily - 7 x weekly - 1  sets - 2 reps - 20 hold - Shoulder Lat Pull Down with Resistance  - 1 x daily - 7 x weekly - 2 sets - 10 reps - Low Trap Setting at Wall  - 1 x daily - 7 x weekly - 3 sets - 5 reps - Shoulder External Rotation with Anchored Resistance  - 1 x daily - 7 x weekly - 2 sets - 10 reps - Standing Shoulder Internal Rotation with Anchored Resistance  - 1 x daily - 7 x weekly - 2 sets - 10 reps  ASSESSMENT:  CLINICAL IMPRESSION: Pt was able to get an appt with Terrilee Files, MD tomorrow. She is aware that if we are to cont PT at this point it would need to be due to a medical intervention as she has mostly plateaued with therapy at this time. However, her Rt upper quadrant was less tight today and her P/ROM was improved of her Rt shoulder. Also her A/ROM before and after MT was better as well. Her Rt shoulder before was: Flex 116 and Abd 76 degrees, then after: Flex 124 and Abd 87 degrees and pt reporting much less pain today an more stretch felt with aggressive P/ROM.   OBJECTIVE IMPAIRMENTS: decreased knowledge of condition, decreased ROM, decreased strength,  increased fascial restrictions, impaired flexibility, impaired UE functional use, postural dysfunction, and pain.   ACTIVITY LIMITATIONS: lifting, sleeping, and reach over head  PARTICIPATION LIMITATIONS:  [place] dancing, working out  PERSONAL FACTORS: 1-2 comorbidities: Right breast cancer s/p chemotherapy and radiation  are also affecting patient's functional outcome.   REHAB POTENTIAL: Good  CLINICAL DECISION MAKING: Stable/uncomplicated  EVALUATION COMPLEXITY: Low  GOALS: Goals reviewed with patient? Yes  SHORT TERM GOALS: Target date: 05/28/2023  Independent with HEP for Right shoulder ROM, strength/stabilization Baseline: Goal status:ONGOING  2.  Decreased pain in Right UT/pecs/ medial arm by 25 % Baseline:  Goal status: MET 6/25  3.  Shoulder flexion and abd improved by 10-12 degrees for improved reaching Baseline:  Goal status: MET Improved shoulder flexion and abd by 15-20 degrees  4.  Improved right scapulo -humeral rhythm and with decreased UT compensation Baseline:  Goal status: ONGOING   LONG TERM GOALS: Target date: 08/22/23  Right UT/pec/arm pain improved by atleast 50%  Baseline: 06/18/23 - 40% improvement at this time ; Goal status: PROGRESSING  2.  Quick dash no greater than 20%for improved function Baseline:  Goal status: INITIAL  3.   Able to perform [place] dancing with correct arm position without increased pain/compensation Baseline:  Goal status: ongoing   4.    Improved right scapulo -humeral rhythm and with decreased UT compensation Baseline: 06/18/23 - pt feels scapulo - humeral rhythm improved about 30% with Rt shoulder A/ROM Goal status: ONGOING   5.  Pt will have improved ability to sleep on the right side without increased pain. Baseline:  Goal status: ongoing still awakens with pain  PLAN:  PT FREQUENCY: 2x/week  PT DURATION: 8 weeks  PLANNED INTERVENTIONS: Therapeutic exercises, Therapeutic activity, Neuromuscular  re-education, Patient/Family education, Self Care, Joint mobilization, Dry Needling, Manual lymph drainage, scar mobilization, Ionotophoresis 4mg /ml Dexamethasone, Manual therapy, and Re-evaluation  PLAN FOR NEXT SESSION: How was appt with Dr. Katrinka Blazing? discuss ideal POC for upcoming ERO (d/c or extend and what frequency), Cont DN, joint mobs at end range, stripping and elongation to pecs, lats, subscapularis, bicep, continue DN and scapular mobs, pulleys, review HEP progression, progress scapular training and mechanics as tol,  lower trap  strengthening to decrease compensation, functional training  Berna Spare, PTA 08/20/23 11:20 AM  PHYSICAL THERAPY DISCHARGE SUMMARY  Visits from Start of Care: 23  Current functional level related to goals / functional outcomes: Pt has stalled in PT.  She saw MD who has ordered an MRI to determine whether other pathology is present such as RTC tear. We will d/c PT for now   Remaining deficits: See above   Education / Equipment: HEP   Patient agrees to discharge. Patient goals were partially met. Patient is being discharged due to lack of progress.  Johanna Beuhring, PT 08/22/23 8:21 AM

## 2023-08-21 ENCOUNTER — Ambulatory Visit (INDEPENDENT_AMBULATORY_CARE_PROVIDER_SITE_OTHER): Payer: 59 | Admitting: Family Medicine

## 2023-08-21 ENCOUNTER — Encounter: Payer: Self-pay | Admitting: Family Medicine

## 2023-08-21 ENCOUNTER — Other Ambulatory Visit: Payer: Self-pay

## 2023-08-21 VITALS — BP 100/68 | HR 98 | Ht 67.0 in | Wt 149.0 lb

## 2023-08-21 DIAGNOSIS — M25512 Pain in left shoulder: Secondary | ICD-10-CM | POA: Diagnosis not present

## 2023-08-21 DIAGNOSIS — R29898 Other symptoms and signs involving the musculoskeletal system: Secondary | ICD-10-CM | POA: Diagnosis not present

## 2023-08-21 DIAGNOSIS — R2242 Localized swelling, mass and lump, left lower limb: Secondary | ICD-10-CM | POA: Diagnosis not present

## 2023-08-21 DIAGNOSIS — M25511 Pain in right shoulder: Secondary | ICD-10-CM | POA: Diagnosis not present

## 2023-08-21 NOTE — Progress Notes (Signed)
[facility] [address] [place] [address] Phone: [phone] Subjective:   [name], am serving as a scribe for Dr. Antoine Primas.  I'm seeing this patient by the request  of:  [name], MD  CC: shoulder pain   [id number]  Allison Reed is a 47 y.o. female coming in with complaint of right shoulder pain. Last seen in May 2023.  Patient states that she has pain with abduction and flexion. Also decrease ROM. PT for 2x a week.   Continues to having L lower leg mass that seems to have not gone away since last year. No pain in mass. Swelling intermittently that will cause pain.   Past medical history is significant for mastectomy for breast cancer as well as chemotherapy.Has been seen by physical therapy for a long amount of time and has stalled significantly with range of motion.   Right shoulder x-rays taken July 30 shows no significant bony abnormality except minor acromioclavicular joint.  Past Medical History:  Diagnosis Date   Diabetes mellitus without complication (HCC)    Type 2   Family history of brain tumor    Family history of skin cancer    Gestational diabetes    Headache    otc med prn   Heartburn in pregnancy    Incompetent cervix in pregnancy 04/2015   Missed abortion    no surgery required   Personal history of chemotherapy    Personal history of radiation therapy    Postpartum care following cesarean delivery (11/2) 10/12/2015   right breast ca 06/2021   UTI in pregnancy 03/2015   recent UTI, treatment completed   Past Surgical History:  Procedure Laterality Date   APPENDECTOMY  12/10/2004   BREAST BIOPSY Right 2022   BREAST BIOPSY Left 2022   BREAST RECONSTRUCTION WITH PLACEMENT OF TISSUE EXPANDER AND ALLODERM Right 07/25/2021   Procedure: RIGHT BREAST RECONSTRUCTION WITH PLACEMENT OF TISSUE EXPANDER AND ALLODERM;  Surgeon: Glenna Fellows, MD;  Location: [facility];  Service:  Plastics;  Laterality: Right;   CERCLAGE LAPAROSCOPIC ABDOMINAL N/A 04/15/2015   Procedure: LAPAROSCOPIC TRANSABDOMINAL CERVCOISTHMIC CERCLAGE;  Surgeon: Fermin Schwab, MD;  Location: [facility];  Service: Gynecology;  Laterality: N/A;   CERVICAL CERCLAGE  02/08/2011   vaginal   CESAREAN SECTION N/A 10/12/2015   Procedure: CESAREAN SECTION;  Surgeon: Genia Del, MD;  Location: [facility];  Service: Obstetrics;  Laterality: N/A;   CESAREAN SECTION N/A 03/03/2018   Procedure: Repeat CESAREAN SECTION/Removal of Abdominal Cerclage;  Surgeon: Shea Evans, MD;  Location: [facility];  Service: Obstetrics;  Laterality: N/A;  EDD: 03/28/18   MASTECTOMY Right 2022   MASTECTOMY W/ SENTINEL NODE BIOPSY Right 07/25/2021   Procedure: RIGHT MASTECTOMY WITH AXILLARY SENTINEL LYMPH NODE BIOPSY;  Surgeon: Emelia Loron, MD;  Location: [facility];  Service: General;  Laterality: Right;   PORTACATH PLACEMENT N/A 08/24/2021   Procedure: INSERTION PORT-A-CATH;  Surgeon: Emelia Loron, MD;  Location: [facility];  Service: General;  Laterality: N/A;   RADIOACTIVE SEED GUIDED AXILLARY SENTINEL LYMPH NODE Right 07/25/2021   Procedure: RADIOACTIVE SEED GUIDED RIGHT AXILLARY NODE EXCISION;  Surgeon: Emelia Loron, MD;  Location: [facility];  Service: General;  Laterality: Right;   TISSUE EXPANDER PLACEMENT Right 12/04/2021   Procedure: REMOVAL RIGHT CHEST TISSUE EXPANDER;  Surgeon: Glenna Fellows, MD;  Location: [facility];  Service: Plastics;  Laterality: Right;   WISDOM TOOTH EXTRACTION  12/11/1999  Social History   Socioeconomic History   Marital status: Married    Spouse name: Dr Cyril Mourning   Number of children: Not on file   Years of education: Not on file   Highest education level: Not on file  Occupational History   Not on file  Tobacco Use   Smoking status: Never   Smokeless tobacco: Never  Vaping Use   Vaping status: Never Used   Substance and Sexual Activity   Alcohol use: Yes    Comment: social   Drug use: No   Sexual activity: Yes    Birth control/protection: None  Other Topics Concern   Not on file  Social History Narrative   Not on file   Social Determinants of Health   Financial Resource Strain: Not on file  Food Insecurity: Not on file  Transportation Needs: Not on file  Physical Activity: Not on file  Stress: Not on file  Social Connections: Not on file   Allergies  Allergen Reactions   Betadine [Povidone Iodine] Rash   Tape Rash   Family History  Problem Relation Age of Onset   Diabetes Father    HIV Maternal Uncle    Skin cancer Maternal Uncle 39   HIV Maternal Uncle    Diabetes Maternal Grandmother    Hypertension Maternal Grandmother    Alzheimer's disease Maternal Grandmother    Cirrhosis Maternal Grandfather    Alcoholism Maternal Grandfather    Other Paternal Grandfather        brain hemorrhage    Current Outpatient Medications (Endocrine & Metabolic):    Empagliflozin-metFORMIN HCl ER (SYNJARDY XR) 09-999 MG TB24, Take by mouth.   TRADJENTA 5 MG TABS tablet, Take 5 mg by mouth daily.   Current Outpatient Medications (Respiratory):    magic mouthwash w/lidocaine SOLN*, Take 5 mLs by mouth 4 (four) times daily as needed for mouth pain.    Current Outpatient Medications (Other):    ciprofloxacin (CIPRO) 500 MG tablet, Take 1 tablet (500 mg total) by mouth 2 (two) times daily.   ergocalciferol (VITAMIN D2) 1.25 MG (50000 UT) capsule, Take 1 capsule (50,000 Units total) by mouth once a week.   letrozole (FEMARA) 2.5 MG tablet, TAKE 1 TABLET BY MOUTH EVERY DAY   magic mouthwash w/lidocaine SOLN*, Take 5 mLs by mouth 4 (four) times daily as needed for mouth pain.   Multiple Vitamins-Minerals (WOMENS DAILY FORMULA PO), Take by mouth daily.   ONETOUCH VERIO test strip, 1 each by Other route 2 (two) times daily. * These medications belong to multiple therapeutic classes and  are listed under each applicable group.   Reviewed prior external information including notes and imaging from  primary care provider As well as notes that were available from care everywhere and other healthcare systems.  Past medical history, social, surgical and family history all reviewed in electronic medical record.  No pertanent information unless stated regarding to the chief complaint.   Review of Systems:  No headache, visual changes, nausea, vomiting, diarrhea, constipation, dizziness, abdominal pain, skin rash, fevers, chills, night sweats, weight loss, swollen lymph nodes, body aches, joint swelling, chest pain, shortness of breath, mood changes. POSITIVE muscle aches  Objective  Blood pressure 100/68, pulse 98, height 5\' 7"  (1.702 m), weight 149 lb (67.6 kg), SpO2 97%.   General: No apparent distress alert and oriented x3 mood and affect normal, dressed appropriately.  HEENT: Pupils equal, extraocular movements intact  Respiratory: Patient's speak in full sentences and does not appear  short of breath  Cardiovascular: No lower extremity edema, non tender, no erythema  Shoulder exam shows patient does have some limited range of motion in all planes with only 5 degrees of external rotation and no internal rotation to lateral hip.  Patient does have weakness, with 3 out of 5 strength of the rotator cuff compared to the contralateral side.  Limited muscular skeletal ultrasound was performed and interpreted by Antoine Primas, M  Limited ultrasound shows atrophy noted of the subscapularis and supraspinatus but difficult to assess secondary to patient's limited range of motion.  Minimal thickening of the capsule but not significant.  Mild arthritic changes of the acromioclavicular joint    Impression and Recommendations:     The above documentation has been reviewed and is accurate and complete Judi Saa, DO

## 2023-08-21 NOTE — Assessment & Plan Note (Signed)
Right shoulder weakness does seem to be out of proportion as well as with his significant limitation in range of motion.  Definitely could go with the potential for the frozen shoulder but has been going on for greater than 4 months of physical therapy.  Patient has a past medical history is significant for diabetes but also considered for right breast cancer status postchemotherapy and radiation.  Differential includes more nerve injuries that could be potentially contributing as well.  I do feel with patient failing all conservative therapy at this time that advanced imaging with an MR arthrogram would be beneficial for further evaluation for any type of loose bodies, labral pathology or rotator cuff pathology.  Ultrasound is somewhat concerning for the possibility of a rotator cuff atrophy noted.  Depending on findings then we will discuss if we can continue with formal physical therapy or consider such things as injections.  Follow-up with me again after imaging to discuss further.

## 2023-08-21 NOTE — Assessment & Plan Note (Signed)
Overall normal at this time

## 2023-08-21 NOTE — Patient Instructions (Addendum)
MRA R shoulder [phone] ?We will be in touch ?

## 2023-08-22 ENCOUNTER — Ambulatory Visit: Payer: 59 | Admitting: Physical Therapy

## 2023-08-22 ENCOUNTER — Encounter: Payer: Self-pay | Admitting: Physical Therapy

## 2023-08-22 NOTE — Addendum Note (Signed)
Addended by: Ethlyn Daniels on: 08/22/2023 08:52 AM   Modules accepted: Orders

## 2023-08-27 ENCOUNTER — Ambulatory Visit: Payer: 59

## 2023-08-27 ENCOUNTER — Inpatient Hospital Stay: Payer: 59

## 2023-08-28 ENCOUNTER — Ambulatory Visit (HOSPITAL_COMMUNITY): Admission: RE | Admit: 2023-08-28 | Payer: 59 | Source: Ambulatory Visit

## 2023-08-28 ENCOUNTER — Ambulatory Visit (HOSPITAL_COMMUNITY): Payer: 59

## 2023-08-29 ENCOUNTER — Ambulatory Visit: Payer: 59 | Admitting: Family Medicine

## 2023-09-03 ENCOUNTER — Inpatient Hospital Stay: Payer: 59 | Attending: Hematology and Oncology

## 2023-09-03 ENCOUNTER — Inpatient Hospital Stay: Payer: 59 | Admitting: Hematology and Oncology

## 2023-09-03 VITALS — BP 101/68 | HR 119 | Temp 97.5°F | Resp 18 | Ht 67.0 in | Wt 149.5 lb

## 2023-09-03 DIAGNOSIS — C50811 Malignant neoplasm of overlapping sites of right female breast: Secondary | ICD-10-CM | POA: Diagnosis present

## 2023-09-03 DIAGNOSIS — Z5111 Encounter for antineoplastic chemotherapy: Secondary | ICD-10-CM | POA: Insufficient documentation

## 2023-09-03 DIAGNOSIS — Z170 Estrogen receptor positive status [ER+]: Secondary | ICD-10-CM | POA: Diagnosis not present

## 2023-09-03 MED ORDER — GOSERELIN ACETATE 3.6 MG ~~LOC~~ IMPL
3.6000 mg | DRUG_IMPLANT | SUBCUTANEOUS | Status: DC
  Administered 2023-09-03: 3.6 mg via SUBCUTANEOUS
  Filled 2023-09-03: qty 3.6

## 2023-09-04 ENCOUNTER — Encounter: Payer: Self-pay | Admitting: Hematology and Oncology

## 2023-09-04 ENCOUNTER — Ambulatory Visit (HOSPITAL_COMMUNITY)
Admission: RE | Admit: 2023-09-04 | Discharge: 2023-09-04 | Disposition: A | Discharge status: Home / Self Care | Payer: 59 | Source: Ambulatory Visit | Attending: Family Medicine | Admitting: Family Medicine

## 2023-09-04 ENCOUNTER — Encounter (HOSPITAL_COMMUNITY): Payer: Self-pay

## 2023-09-04 DIAGNOSIS — M25511 Pain in right shoulder: Secondary | ICD-10-CM

## 2023-09-04 MED ORDER — GADOBUTROL 1 MMOL/ML IV SOLN
0.0500 mL | Freq: Once | INTRAVENOUS | Status: AC | PRN
  Administered 2023-09-04: 0.05 mL

## 2023-09-04 MED ORDER — SODIUM CHLORIDE (PF) 0.9% IJ SOLUTION - NO CHARGE
10.0000 mL | Freq: Once | INTRAMUSCULAR | Status: AC
  Administered 2023-09-04: 10 mL
  Filled 2023-09-04: qty 10

## 2023-09-04 MED ORDER — LIDOCAINE HCL (PF) 1 % IJ SOLN
5.0000 mL | Freq: Once | INTRAMUSCULAR | Status: AC
  Administered 2023-09-04: 5 mL via INTRADERMAL

## 2023-09-04 MED ORDER — IOHEXOL 180 MG/ML  SOLN
10.0000 mL | Freq: Once | INTRAMUSCULAR | Status: AC | PRN
  Administered 2023-09-04: 10 mL via INTRA_ARTICULAR

## 2023-09-04 NOTE — Progress Notes (Signed)
Patient Care Team: [name], MD as PCP - General (Hematology and Oncology) Lonie Peak, MD as Attending Physician (Radiation Oncology) Glenna Fellows, MD as Consulting Physician (Plastic Surgery) [name], MD as Consulting Physician (Hematology and Oncology) Emelia Loron, MD as Consulting Physician (General Surgery)  DIAGNOSIS:  Encounter Diagnosis  Name Primary?   Malignant neoplasm of overlapping sites of right breast in female, estrogen receptor positive (HCC) Yes    SUMMARY OF ONCOLOGIC HISTORY: Oncology History  Malignant neoplasm of overlapping sites of right breast in female, estrogen receptor positive (HCC)  06/16/2021 Initial Diagnosis   Palpable right breast mass: Breast MRI revealed extensive involvement of the right breast with mass and non-mass enhancement superior right breast mass measures 4.9 x 1.2 cm and together with non-mass enhancement measured 8.8 cm, enhancing mass LOQ 1.3 cm, left breast indeterminate 0.9 cm mass and a 0.8 cm mass UOQ, single abnormal lymph node Biopsy 10:00, 11:00 and 12:00: IDC with DCIS, biopsy right axillary lymph node: IDC, ER 75 to 95%, PR 90 to 95%, Ki-67 25%, HER2 negative on 1 biopsy and 2+ by IHC and FISH pending   06/21/2021 Cancer Staging   Staging form: Breast, AJCC 8th Edition - Clinical stage from 06/21/2021: Stage IIA (cT3, cN1, cM0, G2, ER+, PR+, HER2-) - Signed by [name], MD on 06/21/2021 Histologic grading system: 3 grade system   07/01/2021 Genetic Testing   Negative genetic testing:  No pathogenic variants detected on the [employer] BRCAplus panel (report date 07/01/2021) or the [employer] CancerNext-Expanded + RNAinsight panel (report date 07/01/2021).   The BRCAplus panel offered by [employer] and includes sequencing and deletion/duplication analysis for the following 8 genes: ATM, BRCA1, BRCA2, CDH1, CHEK2, PALB2, PTEN, and TP53. The CancerNext-Expanded + RNAinsight gene panel offered by [employer] and  includes sequencing and rearrangement analysis for the following 77 genes: AIP, ALK, APC, ATM, AXIN2, BAP1, BARD1, BLM, BMPR1A, BRCA1, BRCA2, BRIP1, CDC73, CDH1, CDK4, CDKN1B, CDKN2A, CHEK2, CTNNA1, DICER1, FANCC, FH, FLCN, GALNT12, KIF1B, LZTR1, MAX, MEN1, MET, MLH1, MSH2, MSH3, MSH6, MUTYH, NBN, NF1, NF2, NTHL1, PALB2, PHOX2B, PMS2, POT1, PRKAR1A, PTCH1, PTEN, RAD51C, RAD51D, RB1, RECQL, RET, SDHA, SDHAF2, SDHB, SDHC, SDHD, SMAD4, SMARCA4, SMARCB1, SMARCE1, STK11, SUFU, TMEM127, TP53, TSC1, TSC2, VHL and XRCC2 (sequencing and deletion/duplication); EGFR, EGLN1, HOXB13, KIT, MITF, PDGFRA, POLD1 and POLE (sequencing only); EPCAM and GREM1 (deletion/duplication only). RNA data is routinely analyzed for use in variant interpretation for all genes.   07/25/2021 Surgery   Right mastectomy: Foci of invasive ductal carcinoma measuring 1.1 cm grade 2 with extensive DCIS spanning 8.9 cm, margins negative, 4/7 lymph nodes, ER 75 to 95%, PR 9095%, HER2 negative, Ki-67 25%   07/31/2021 Cancer Staging   Staging form: Breast, AJCC 8th Edition - Pathologic: Stage IB (pT1c, pN2a(sn), cM0, G2, ER+, PR+, HER2-) - Signed by [name], MD on 09/11/2022 Method of lymph node assessment: Sentinel lymph node biopsy Histologic grading system: 3 grade system   08/25/2021 - 01/19/2022 Adjuvant Chemotherapy   Adjuvant chemotherapy: dose dense AC x 4 followed by weekly Taxol x 12.    02/26/2022 - 04/04/2022 Radiation Therapy   Site Technique Total Dose (Gy) Dose per Fx (Gy) Completed Fx Beam Energies  Chest Wall, Right: CW_R_IMN 3D 50.4/50.4 1.8 28/28 6X  Chest Wall, Right: CW_R_PAB_SCV 3D 50.4/50.4 1.8 28/28 6X, 10X     03/14/2022 -  Anti-estrogen oral therapy   Zoladex.  Letrozole started 07/2022.       CHIEF COMPLIANT: Follow-up on Zoladex with  letrozole  History of Present Illness   [facility]ison [facility], a patient with a history of breast cancer, presents with a chief complaint of right shoulder pain. The pain began at the start of  the year and has progressively worsened, leading her to seek physical therapy. Despite therapy, the pain did not improve and was later diagnosed as a rotator cuff tear by a specialist.  In addition to the shoulder pain, Allison Reed expresses a high level of fear about her cancer returning. This fear has been heightened due to knowing two other individuals whose cancer has returned. She is currently on Zoladex shots with letrozole as part of her cancer treatment.  Allison Reed also reports experiencing significant fatigue, which she describes as a constant struggle. She notes that the fatigue does not improve with rest and is not sure if it is related to her shoulder pain or a side effect of her cancer treatment. Despite the fatigue, she has been able to maintain physical activity, including running three to four miles.  She also reports high sugar levels, for which she has been prescribed Ozempic. She has been on this medication for three weeks and has noticed weight loss, which she attributes to diarrhea.      ALLERGIES:  is allergic to betadine [povidone iodine] and tape.  MEDICATIONS:  Current Outpatient Medications  Medication Sig Dispense Refill   ciprofloxacin (CIPRO) 500 MG tablet Take 1 tablet (500 mg total) by mouth 2 (two) times daily. 10 tablet 0   Empagliflozin-metFORMIN HCl ER (SYNJARDY XR) 09-999 MG TB24 Take by mouth.     ergocalciferol (VITAMIN D2) 1.25 MG (50000 UT) capsule Take 1 capsule (50,000 Units total) by mouth once a week. 12 capsule 0   letrozole (FEMARA) 2.5 MG tablet TAKE 1 TABLET BY MOUTH EVERY DAY 90 tablet 3   magic mouthwash w/lidocaine SOLN Take 5 mLs by mouth 4 (four) times daily as needed for mouth pain. 240 mL 1   Multiple Vitamins-Minerals (WOMENS DAILY FORMULA PO) Take by mouth daily.     ONETOUCH VERIO test strip 1 each by Other route 2 (two) times daily.     TRADJENTA 5 MG TABS tablet Take 5 mg by mouth daily.     No current facility-administered medications for  this visit.    PHYSICAL EXAMINATION: ECOG PERFORMANCE STATUS: 1 - Symptomatic but completely ambulatory  Vitals:   09/03/23 1441  BP: 101/68  Pulse: (!) 119  Resp: 18  Temp: (!) 97.5 F (36.4 C)  SpO2: 100%   Filed Weights   09/03/23 1441  Weight: 149 lb 8 oz (67.8 kg)      LABORATORY DATA:  I have reviewed the data as listed    Latest Ref Rng & Units 07/09/2023   12:57 PM 03/12/2023    1:30 PM 02/05/2023    8:06 AM  CMP  Glucose 70 - 99 mg/dL 161  096  045   BUN 6 - 20 mg/dL 11  13  11    Creatinine 0.44 - 1.00 mg/dL 4.09  8.11  9.14   Sodium 135 - 145 mmol/L 138  137  138   Potassium 3.5 - 5.1 mmol/L 4.1  4.1  4.3   Chloride 98 - 111 mmol/L 101  103  99   CO2 22 - 32 mmol/L 24  26  30    Calcium 8.9 - 10.3 mg/dL 78.2  9.5  9.4   Total Protein 6.5 - 8.1 g/dL 7.8  6.9  7.3   Total Bilirubin 0.3 -  1.2 mg/dL 0.5  0.5  0.6   Alkaline Phos 38 - 126 U/L 68  49  49   AST 15 - 41 U/L 24  24  24    ALT 0 - 44 U/L 29  29  26      Lab Results  Component Value Date   WBC 6.6 07/09/2023   HGB 15.9 (H) 07/09/2023   HCT 46.1 (H) 07/09/2023   MCV 76.7 (L) 07/09/2023   PLT 216 07/09/2023   NEUTROABS 4.3 07/09/2023    ASSESSMENT & PLAN:    Assessment and Plan    Breast cancer: Continue with monthly Zoladex injections with letrozole.  Rotator Cuff Tear Persistent shoulder pain initially thought to be due to cording or frozen shoulder. Ultrasound confirmed rotator cuff tear. MRI scheduled to assess severity. -Complete MRI with contrast. -Continue physical therapy.  Cancer Anxiety Expressed fear of cancer recurrence despite completion of treatment. Discussed the uncertainty and reassurance provided by surveillance. -Plan whole body CT and bone scan for surveillance. -Consider annual scans for the first five years post-treatment.  Fatigue Persistent fatigue not improved with rest. Unclear if related to shoulder pain or post-cancer treatment effects. Normal thyroid, iron,  B12, and vitamin D levels. Elevated hemoglobin. -Check thyroid, B12 levels. -Consider sleep study to rule out sleep apnea. -Repeat hemoglobin level in three months. It is possible that the cause of fatigue may be Zoladex and letrozole.  Diabetes HbA1c of 7.8 despite treatment, recently started on Ozempic. -Continue Ozempic. -Follow up with Dr. Tildon Husky regarding urinary E. Coli and diabetes management.  Follow-up in six months or sooner if scan results warrant. Add lab appointment in three months to recheck hemoglobin level.      Orders Placed This Encounter  Procedures   CT CHEST ABDOMEN PELVIS W CONTRAST    Standing Status:   Future    Standing Expiration Date:   09/02/2024    Order Specific Question:   If indicated for the ordered procedure, I authorize the administration of contrast media per Radiology protocol    Answer:   Yes    Order Specific Question:   Does the patient have a contrast media/X-ray dye allergy?    Answer:   No    Order Specific Question:   Is patient pregnant?    Answer:   No    Order Specific Question:   Preferred imaging location?    Answer:   [facility]    Order Specific Question:   Release to patient    Answer:   Immediate    Order Specific Question:   If indicated for the ordered procedure, I authorize the administration of oral contrast media per Radiology protocol    Answer:   Yes   NM Bone Scan Whole Body    Standing Status:   Future    Standing Expiration Date:   09/02/2024    Order Specific Question:   If indicated for the ordered procedure, I authorize the administration of a radiopharmaceutical per Radiology protocol    Answer:   Yes    Order Specific Question:   Is the patient pregnant?    Answer:   No    Order Specific Question:   Preferred imaging location?    Answer:   [facility]    Order Specific Question:   Release to patient    Answer:   Immediate   CBC with Differential (Cancer Center Only)    Standing Status:    Future  Standing Expiration Date:   09/02/2024   CMP (Cancer Center only)    Standing Status:   Future    Standing Expiration Date:   09/02/2024   JAK2 (INCLUDING V617F AND EXON 12), MPL,& CALR W/RFL MPN PANEL (NGS)    Standing Status:   Future    Standing Expiration Date:   09/02/2024   The patient has a good understanding of the overall plan. she agrees with it. she will call with any problems that may develop before the next visit here. Total time spent: 30 mins including face to face time and time spent for planning, charting and co-ordination of care   Tamsen Meek, MD 09/04/23

## 2023-09-05 ENCOUNTER — Ambulatory Visit: Payer: 59 | Admitting: Family Medicine

## 2023-09-06 ENCOUNTER — Ambulatory Visit (HOSPITAL_COMMUNITY): Payer: 59

## 2023-09-06 ENCOUNTER — Ambulatory Visit (HOSPITAL_COMMUNITY): Admission: RE | Admit: 2023-09-06 | Payer: 59 | Source: Ambulatory Visit

## 2023-09-11 ENCOUNTER — Encounter (HOSPITAL_COMMUNITY)
Admission: RE | Admit: 2023-09-11 | Discharge: 2023-09-11 | Disposition: A | Discharge status: Home / Self Care | Payer: 59 | Source: Ambulatory Visit | Attending: Hematology and Oncology | Admitting: Hematology and Oncology

## 2023-09-11 DIAGNOSIS — O24113 Pre-existing diabetes mellitus, type 2, in pregnancy, third trimester: Secondary | ICD-10-CM | POA: Insufficient documentation

## 2023-09-11 DIAGNOSIS — C50811 Malignant neoplasm of overlapping sites of right female breast: Secondary | ICD-10-CM | POA: Diagnosis present

## 2023-09-11 DIAGNOSIS — Z170 Estrogen receptor positive status [ER+]: Secondary | ICD-10-CM | POA: Insufficient documentation

## 2023-09-11 LAB — POCT I-STAT CREATININE: Creatinine, Ser: 0.5 mg/dL (ref 0.44–1.00)

## 2023-09-11 MED ORDER — IOHEXOL 300 MG/ML  SOLN
100.0000 mL | Freq: Once | INTRAMUSCULAR | Status: AC | PRN
  Administered 2023-09-11: 100 mL via INTRAVENOUS

## 2023-09-13 NOTE — Progress Notes (Deleted)
Allison Reed [address] [place] [address] Phone: [phone] Subjective:    I'm seeing this patient by the request  of:  [name], MD  CC:   NGE:[id number]  08/21/2023 Right shoulder weakness does seem to be out of proportion as well as with his significant limitation in range of motion.  Definitely could go with the potential for the frozen shoulder but has been going on for greater than 4 months of physical therapy.  Patient has a past medical history is significant for diabetes but also considered for right breast cancer status postchemotherapy and radiation.  Differential includes more nerve injuries that could be potentially contributing as well.  I do feel with patient failing all conservative therapy at this time that advanced imaging with an MR arthrogram would be beneficial for further evaluation for any type of loose bodies, labral pathology or rotator cuff pathology.  Ultrasound is somewhat concerning for the possibility of a rotator cuff atrophy noted.  Depending on findings then we will discuss if we can continue with formal physical therapy or consider such things as injections.  Follow-up with me again after imaging to discuss further.      Update 09/16/2023 Allison Reed is a 47 y.o. female coming in with complaint of R shoulder pain. Patient states      Past Medical History:  Diagnosis Date   Diabetes mellitus without complication (HCC)    Type 2   Family history of brain tumor    Family history of skin cancer    Gestational diabetes    Headache    otc med prn   Heartburn in pregnancy    Incompetent cervix in pregnancy 04/2015   Missed abortion    no surgery required   Personal history of chemotherapy    Personal history of radiation therapy    Postpartum care following cesarean delivery (11/2) 10/12/2015   right breast ca 06/2021   UTI in pregnancy 03/2015   recent UTI, treatment completed   Past Surgical History:   Procedure Laterality Date   APPENDECTOMY  12/10/2004   BREAST BIOPSY Right 2022   BREAST BIOPSY Left 2022   BREAST RECONSTRUCTION WITH PLACEMENT OF TISSUE EXPANDER AND ALLODERM Right 07/25/2021   Procedure: RIGHT BREAST RECONSTRUCTION WITH PLACEMENT OF TISSUE EXPANDER AND ALLODERM;  Surgeon: Glenna Fellows, MD;  Location: [facility];  Service: Plastics;  Laterality: Right;   CERCLAGE LAPAROSCOPIC ABDOMINAL N/A 04/15/2015   Procedure: LAPAROSCOPIC TRANSABDOMINAL CERVCOISTHMIC CERCLAGE;  Surgeon: Fermin Schwab, MD;  Location: [facility];  Service: Gynecology;  Laterality: N/A;   CERVICAL CERCLAGE  02/08/2011   vaginal   CESAREAN SECTION N/A 10/12/2015   Procedure: CESAREAN SECTION;  Surgeon: Genia Del, MD;  Location: [facility];  Service: Obstetrics;  Laterality: N/A;   CESAREAN SECTION N/A 03/03/2018   Procedure: Repeat CESAREAN SECTION/Removal of Abdominal Cerclage;  Surgeon: Shea Evans, MD;  Location: [facility];  Service: Obstetrics;  Laterality: N/A;  EDD: 03/28/18   MASTECTOMY Right 2022   MASTECTOMY W/ SENTINEL NODE BIOPSY Right 07/25/2021   Procedure: RIGHT MASTECTOMY WITH AXILLARY SENTINEL LYMPH NODE BIOPSY;  Surgeon: Emelia Loron, MD;  Location: [facility];  Service: General;  Laterality: Right;   PORTACATH PLACEMENT N/A 08/24/2021   Procedure: INSERTION PORT-A-CATH;  Surgeon: Emelia Loron, MD;  Location: [facility];  Service: General;  Laterality: N/A;   RADIOACTIVE SEED GUIDED AXILLARY SENTINEL LYMPH NODE Right 07/25/2021   Procedure: RADIOACTIVE SEED  GUIDED RIGHT AXILLARY NODE EXCISION;  Surgeon: Emelia Loron, MD;  Location: [facility];  Service: General;  Laterality: Right;   TISSUE EXPANDER PLACEMENT Right 12/04/2021   Procedure: REMOVAL RIGHT CHEST TISSUE EXPANDER;  Surgeon: Glenna Fellows, MD;  Location: [facility];  Service: Plastics;  Laterality: Right;   TISSUE EXPANDER REMOVAL      WISDOM TOOTH EXTRACTION  12/11/1999   Social History   Socioeconomic History   Marital status: Married    Spouse name: Dr Cyril Mourning   Number of children: Not on file   Years of education: Not on file   Highest education level: Not on file  Occupational History   Not on file  Tobacco Use   Smoking status: Never   Smokeless tobacco: Never  Vaping Use   Vaping status: Never Used  Substance and Sexual Activity   Alcohol use: Yes    Comment: social   Drug use: No   Sexual activity: Yes    Birth control/protection: None  Other Topics Concern   Not on file  Social History Narrative   Not on file   Social Determinants of Health   Financial Resource Strain: Not on file  Food Insecurity: Not on file  Transportation Needs: Not on file  Physical Activity: Not on file  Stress: Not on file  Social Connections: Not on file   Allergies  Allergen Reactions   Betadine [Povidone Iodine] Rash   Tape Rash   Family History  Problem Relation Age of Onset   Diabetes Father    HIV Maternal Uncle    Skin cancer Maternal Uncle 39   HIV Maternal Uncle    Diabetes Maternal Grandmother    Hypertension Maternal Grandmother    Alzheimer's disease Maternal Grandmother    Cirrhosis Maternal Grandfather    Alcoholism Maternal Grandfather    Other Paternal Grandfather        brain hemorrhage    Current Outpatient Medications (Endocrine & Metabolic):    Empagliflozin-metFORMIN HCl ER (SYNJARDY XR) 09-999 MG TB24, Take by mouth.   TRADJENTA 5 MG TABS tablet, Take 5 mg by mouth daily.   Current Outpatient Medications (Respiratory):    magic mouthwash w/lidocaine SOLN*, Take 5 mLs by mouth 4 (four) times daily as needed for mouth pain.    Current Outpatient Medications (Other):    ciprofloxacin (CIPRO) 500 MG tablet, Take 1 tablet (500 mg total) by mouth 2 (two) times daily.   ergocalciferol (VITAMIN D2) 1.25 MG (50000 UT) capsule, Take 1 capsule (50,000 Units total) by mouth once a  week.   letrozole (FEMARA) 2.5 MG tablet, TAKE 1 TABLET BY MOUTH EVERY DAY   magic mouthwash w/lidocaine SOLN*, Take 5 mLs by mouth 4 (four) times daily as needed for mouth pain.   Multiple Vitamins-Minerals (WOMENS DAILY FORMULA PO), Take by mouth daily.   ONETOUCH VERIO test strip, 1 each by Other route 2 (two) times daily. * These medications belong to multiple therapeutic classes and are listed under each applicable group.   Reviewed prior external information including notes and imaging from  primary care provider As well as notes that were available from care everywhere and other healthcare systems.  Past medical history, social, surgical and family history all reviewed in electronic medical record.  No pertanent information unless stated regarding to the chief complaint.   Review of Systems:  No headache, visual changes, nausea, vomiting, diarrhea, constipation, dizziness, abdominal pain, skin rash, fevers, chills, night sweats, weight loss, swollen lymph nodes,  body aches, joint swelling, chest pain, shortness of breath, mood changes. POSITIVE muscle aches  Objective  There were no vitals taken for this visit.   General: No apparent distress alert and oriented x3 mood and affect normal, dressed appropriately.  HEENT: Pupils equal, extraocular movements intact  Respiratory: Patient's speak in full sentences and does not appear short of breath  Cardiovascular: No lower extremity edema, non tender, no erythema      Impression and Recommendations:

## 2023-09-16 ENCOUNTER — Other Ambulatory Visit: Payer: 59

## 2023-09-16 ENCOUNTER — Ambulatory Visit: Payer: 59 | Admitting: Family Medicine

## 2023-09-17 NOTE — Addendum Note (Signed)
Addended by: Debbe Odea R on: 09/17/2023 10:09 AM   Modules accepted: Orders

## 2023-09-19 ENCOUNTER — Ambulatory Visit (HOSPITAL_COMMUNITY)
Admission: RE | Admit: 2023-09-19 | Discharge: 2023-09-19 | Disposition: A | Discharge status: Home / Self Care | Payer: 59 | Source: Ambulatory Visit | Attending: Hematology and Oncology | Admitting: Hematology and Oncology

## 2023-09-19 DIAGNOSIS — Z170 Estrogen receptor positive status [ER+]: Secondary | ICD-10-CM | POA: Insufficient documentation

## 2023-09-19 DIAGNOSIS — C50811 Malignant neoplasm of overlapping sites of right female breast: Secondary | ICD-10-CM | POA: Diagnosis present

## 2023-09-19 MED ORDER — TECHNETIUM TC 99M MEDRONATE IV KIT
20.0000 | PACK | Freq: Once | INTRAVENOUS | Status: AC | PRN
  Administered 2023-09-19: 19.2 via INTRAVENOUS

## 2023-09-24 ENCOUNTER — Inpatient Hospital Stay: Payer: 59

## 2023-09-24 ENCOUNTER — Ambulatory Visit: Payer: 59 | Admitting: Family Medicine

## 2023-09-24 ENCOUNTER — Inpatient Hospital Stay: Payer: 59 | Admitting: Hematology and Oncology

## 2023-09-26 ENCOUNTER — Inpatient Hospital Stay: Payer: 59

## 2023-09-26 ENCOUNTER — Inpatient Hospital Stay: Payer: 59 | Admitting: Hematology and Oncology

## 2023-10-01 ENCOUNTER — Inpatient Hospital Stay: Payer: 59 | Admitting: Hematology and Oncology

## 2023-10-01 ENCOUNTER — Inpatient Hospital Stay: Payer: 59 | Attending: Hematology and Oncology

## 2023-10-01 ENCOUNTER — Other Ambulatory Visit: Payer: Self-pay

## 2023-10-01 DIAGNOSIS — Z79818 Long term (current) use of other agents affecting estrogen receptors and estrogen levels: Secondary | ICD-10-CM | POA: Insufficient documentation

## 2023-10-01 DIAGNOSIS — Z5111 Encounter for antineoplastic chemotherapy: Secondary | ICD-10-CM | POA: Diagnosis present

## 2023-10-01 DIAGNOSIS — C50811 Malignant neoplasm of overlapping sites of right female breast: Secondary | ICD-10-CM | POA: Insufficient documentation

## 2023-10-01 DIAGNOSIS — Z170 Estrogen receptor positive status [ER+]: Secondary | ICD-10-CM | POA: Diagnosis not present

## 2023-10-01 MED ORDER — GOSERELIN ACETATE 3.6 MG ~~LOC~~ IMPL
3.6000 mg | DRUG_IMPLANT | SUBCUTANEOUS | Status: DC
  Administered 2023-10-01: 3.6 mg via SUBCUTANEOUS
  Filled 2023-10-01: qty 3.6

## 2023-10-02 ENCOUNTER — Ambulatory Visit: Payer: 59 | Admitting: Physical Therapy

## 2023-10-29 ENCOUNTER — Inpatient Hospital Stay: Payer: 59 | Attending: Hematology and Oncology

## 2023-10-29 ENCOUNTER — Other Ambulatory Visit: Payer: Self-pay

## 2023-10-29 VITALS — BP 102/75 | HR 97 | Temp 98.4°F | Resp 18

## 2023-10-29 DIAGNOSIS — C50811 Malignant neoplasm of overlapping sites of right female breast: Secondary | ICD-10-CM | POA: Diagnosis present

## 2023-10-29 DIAGNOSIS — Z5111 Encounter for antineoplastic chemotherapy: Secondary | ICD-10-CM | POA: Insufficient documentation

## 2023-10-29 DIAGNOSIS — Z79818 Long term (current) use of other agents affecting estrogen receptors and estrogen levels: Secondary | ICD-10-CM | POA: Insufficient documentation

## 2023-10-29 DIAGNOSIS — Z9011 Acquired absence of right breast and nipple: Secondary | ICD-10-CM | POA: Diagnosis not present

## 2023-10-29 DIAGNOSIS — Z170 Estrogen receptor positive status [ER+]: Secondary | ICD-10-CM | POA: Insufficient documentation

## 2023-10-29 MED ORDER — GOSERELIN ACETATE 3.6 MG ~~LOC~~ IMPL
3.6000 mg | DRUG_IMPLANT | SUBCUTANEOUS | Status: DC
  Administered 2023-10-29: 3.6 mg via SUBCUTANEOUS
  Filled 2023-10-29: qty 3.6

## 2023-11-19 ENCOUNTER — Other Ambulatory Visit (HOSPITAL_COMMUNITY): Payer: Self-pay

## 2023-11-19 MED ORDER — INTRAROSA 6.5 MG VA INST
6.5000 mg | VAGINAL_INSERT | Freq: Every day | VAGINAL | 12 refills | Status: AC
Start: 2023-11-19 — End: ?
  Filled 2023-11-19 – 2023-11-27 (×2): qty 28, 28d supply, fill #0

## 2023-11-20 ENCOUNTER — Other Ambulatory Visit (HOSPITAL_COMMUNITY): Payer: Self-pay

## 2023-11-20 ENCOUNTER — Encounter: Payer: Self-pay | Admitting: Hematology and Oncology

## 2023-11-21 ENCOUNTER — Encounter: Payer: Self-pay | Admitting: Family Medicine

## 2023-11-22 ENCOUNTER — Other Ambulatory Visit: Payer: Self-pay

## 2023-11-22 DIAGNOSIS — G8929 Other chronic pain: Secondary | ICD-10-CM

## 2023-11-26 ENCOUNTER — Inpatient Hospital Stay (HOSPITAL_BASED_OUTPATIENT_CLINIC_OR_DEPARTMENT_OTHER): Payer: 59 | Admitting: Hematology and Oncology

## 2023-11-26 ENCOUNTER — Other Ambulatory Visit: Payer: Self-pay | Admitting: *Deleted

## 2023-11-26 ENCOUNTER — Inpatient Hospital Stay: Payer: 59

## 2023-11-26 ENCOUNTER — Inpatient Hospital Stay: Payer: 59 | Attending: Hematology and Oncology

## 2023-11-26 VITALS — BP 87/63 | HR 98 | Temp 98.2°F | Resp 18 | Ht 67.0 in | Wt 147.4 lb

## 2023-11-26 DIAGNOSIS — Z170 Estrogen receptor positive status [ER+]: Secondary | ICD-10-CM

## 2023-11-26 DIAGNOSIS — Z5111 Encounter for antineoplastic chemotherapy: Secondary | ICD-10-CM | POA: Insufficient documentation

## 2023-11-26 DIAGNOSIS — Z79811 Long term (current) use of aromatase inhibitors: Secondary | ICD-10-CM | POA: Diagnosis not present

## 2023-11-26 DIAGNOSIS — C50811 Malignant neoplasm of overlapping sites of right female breast: Secondary | ICD-10-CM | POA: Insufficient documentation

## 2023-11-26 LAB — CBC WITH DIFFERENTIAL (CANCER CENTER ONLY)
Abs Immature Granulocytes: 0.01 10*3/uL (ref 0.00–0.07)
Basophils Absolute: 0 10*3/uL (ref 0.0–0.1)
Basophils Relative: 0 %
Eosinophils Absolute: 0.2 10*3/uL (ref 0.0–0.5)
Eosinophils Relative: 4 %
HCT: 43.2 % (ref 36.0–46.0)
Hemoglobin: 15 g/dL (ref 12.0–15.0)
Immature Granulocytes: 0 %
Lymphocytes Relative: 36 %
Lymphs Abs: 2 10*3/uL (ref 0.7–4.0)
MCH: 27.1 pg (ref 26.0–34.0)
MCHC: 34.7 g/dL (ref 30.0–36.0)
MCV: 78.1 fL — ABNORMAL LOW (ref 80.0–100.0)
Monocytes Absolute: 0.4 10*3/uL (ref 0.1–1.0)
Monocytes Relative: 7 %
Neutro Abs: 2.8 10*3/uL (ref 1.7–7.7)
Neutrophils Relative %: 53 %
Platelet Count: 219 10*3/uL (ref 150–400)
RBC: 5.53 MIL/uL — ABNORMAL HIGH (ref 3.87–5.11)
RDW: 13.6 % (ref 11.5–15.5)
WBC Count: 5.4 10*3/uL (ref 4.0–10.5)
nRBC: 0 % (ref 0.0–0.2)

## 2023-11-26 LAB — CMP (CANCER CENTER ONLY)
ALT: 20 U/L (ref 0–44)
AST: 17 U/L (ref 15–41)
Albumin: 4.6 g/dL (ref 3.5–5.0)
Alkaline Phosphatase: 69 U/L (ref 38–126)
Anion gap: 7 (ref 5–15)
BUN: 10 mg/dL (ref 6–20)
CO2: 30 mmol/L (ref 22–32)
Calcium: 9.8 mg/dL (ref 8.9–10.3)
Chloride: 99 mmol/L (ref 98–111)
Creatinine: 0.6 mg/dL (ref 0.44–1.00)
GFR, Estimated: >60 mL/min (ref >60)
Glucose, Bld: 180 mg/dL — ABNORMAL HIGH (ref 70–99)
Potassium: 4 mmol/L (ref 3.5–5.1)
Sodium: 136 mmol/L (ref 135–145)
Total Bilirubin: 0.4 mg/dL (ref <1.2)
Total Protein: 6.7 g/dL (ref 6.5–8.1)

## 2023-11-26 MED ORDER — GOSERELIN ACETATE 3.6 MG ~~LOC~~ IMPL: 3.6000 mg | DRUG_IMPLANT | Freq: Once | SUBCUTANEOUS | Status: DC

## 2023-11-26 MED ORDER — GOSERELIN ACETATE 3.6 MG ~~LOC~~ IMPL
3.6000 mg | DRUG_IMPLANT | SUBCUTANEOUS | Status: DC
  Administered 2023-11-26: 3.6 mg via SUBCUTANEOUS
  Filled 2023-11-26 (×2): qty 3.6

## 2023-11-26 NOTE — Progress Notes (Signed)
Patient Care Team: [name], MD as PCP - General (Hematology and Oncology) Lonie Peak, MD as Attending Physician (Radiation Oncology) Glenna Fellows, MD as Consulting Physician (Plastic Surgery) [name], MD as Consulting Physician (Hematology and Oncology) Emelia Loron, MD as Consulting Physician (General Surgery)  DIAGNOSIS:  Encounter Diagnosis  Name Primary?   Malignant neoplasm of overlapping sites of right breast in female, estrogen receptor positive (HCC) Yes    SUMMARY OF ONCOLOGIC HISTORY: Oncology History  Malignant neoplasm of overlapping sites of right breast in female, estrogen receptor positive (HCC)  06/16/2021 Initial Diagnosis   Palpable right breast mass: Breast MRI revealed extensive involvement of the right breast with mass and non-mass enhancement superior right breast mass measures 4.9 x 1.2 cm and together with non-mass enhancement measured 8.8 cm, enhancing mass LOQ 1.3 cm, left breast indeterminate 0.9 cm mass and a 0.8 cm mass UOQ, single abnormal lymph node Biopsy 10:00, 11:00 and 12:00: IDC with DCIS, biopsy right axillary lymph node: IDC, ER 75 to 95%, PR 90 to 95%, Ki-67 25%, HER2 negative on 1 biopsy and 2+ by IHC and FISH pending   06/21/2021 Cancer Staging   Staging form: Breast, AJCC 8th Edition - Clinical stage from 06/21/2021: Stage IIA (cT3, cN1, cM0, G2, ER+, PR+, HER2-) - Signed by [name], MD on 06/21/2021 Histologic grading system: 3 grade system   07/01/2021 Genetic Testing   Negative genetic testing:  No pathogenic variants detected on the [facility] BRCAplus panel (report date 07/01/2021) or the [employer] CancerNext-Expanded + RNAinsight panel (report date 07/01/2021).   The BRCAplus panel offered by [employer] and includes sequencing and deletion/duplication analysis for the following 8 genes: ATM, BRCA1, BRCA2, CDH1, CHEK2, PALB2, PTEN, and TP53. The CancerNext-Expanded + RNAinsight gene panel offered by [employer] and  includes sequencing and rearrangement analysis for the following 77 genes: AIP, ALK, APC, ATM, AXIN2, BAP1, BARD1, BLM, BMPR1A, BRCA1, BRCA2, BRIP1, CDC73, CDH1, CDK4, CDKN1B, CDKN2A, CHEK2, CTNNA1, DICER1, FANCC, FH, FLCN, GALNT12, KIF1B, LZTR1, MAX, MEN1, MET, MLH1, MSH2, MSH3, MSH6, MUTYH, NBN, NF1, NF2, NTHL1, PALB2, PHOX2B, PMS2, POT1, PRKAR1A, PTCH1, PTEN, RAD51C, RAD51D, RB1, RECQL, RET, SDHA, SDHAF2, SDHB, SDHC, SDHD, SMAD4, SMARCA4, SMARCB1, SMARCE1, STK11, SUFU, TMEM127, TP53, TSC1, TSC2, VHL and XRCC2 (sequencing and deletion/duplication); EGFR, EGLN1, HOXB13, KIT, MITF, PDGFRA, POLD1 and POLE (sequencing only); EPCAM and GREM1 (deletion/duplication only). RNA data is routinely analyzed for use in variant interpretation for all genes.   07/25/2021 Surgery   Right mastectomy: Foci of invasive ductal carcinoma measuring 1.1 cm grade 2 with extensive DCIS spanning 8.9 cm, margins negative, 4/7 lymph nodes, ER 75 to 95%, PR 9095%, HER2 negative, Ki-67 25%   07/31/2021 Cancer Staging   Staging form: Breast, AJCC 8th Edition - Pathologic: Stage IB (pT1c, pN2a(sn), cM0, G2, ER+, PR+, HER2-) - Signed by [name], MD on 09/11/2022 Method of lymph node assessment: Sentinel lymph node biopsy Histologic grading system: 3 grade system   08/25/2021 - 01/19/2022 Adjuvant Chemotherapy   Adjuvant chemotherapy: dose dense AC x 4 followed by weekly Taxol x 12.    02/26/2022 - 04/04/2022 Radiation Therapy   Site Technique Total Dose (Gy) Dose per Fx (Gy) Completed Fx Beam Energies  Chest Wall, Right: CW_R_IMN 3D 50.4/50.4 1.8 28/28 6X  Chest Wall, Right: CW_R_PAB_SCV 3D 50.4/50.4 1.8 28/28 6X, 10X     03/14/2022 -  Anti-estrogen oral therapy   Zoladex.  Letrozole started 07/2022.       CHIEF COMPLIANT: F/U on Zoladex inj  HISTORY OF PRESENT ILLNESS:   History of Present Illness   The patient, with a history of breast cancer and currently on Zoladex injections, presents with various health concerns.  She previously had a bone scan and CT scans, the results of which took three weeks to come back, causing significant stress.    The patient also reports vaginal dryness, for which her OBGYN has prescribed a cream. The cream, which is yet to be started, is expected to thicken the skin around the vagina and improve moisture.  The patient has also been experiencing weight loss, which she attributes to the medication Ozympic. She reports a loss of about ten pounds    The patient's most pressing concern is a shoulder injury. She reports significant improvement in pain and mobility after receiving treatment from a physical therapist. The injury, initially causing severe pain and limiting the patient's ability to lift her hand or sleep on the right side, is now only causing soreness and tightness. The patient is scheduled to start orthopedic treatment next year.  The patient also mentions the side effects of the Xolair injections, which she has been managing well.         ALLERGIES:  is allergic to betadine [povidone iodine] and tape.  MEDICATIONS:  Current Outpatient Medications  Medication Sig Dispense Refill   ciprofloxacin (CIPRO) 500 MG tablet Take 1 tablet (500 mg total) by mouth 2 (two) times daily. 10 tablet 0   Empagliflozin-metFORMIN HCl ER (SYNJARDY XR) 09-999 MG TB24 Take by mouth.     ergocalciferol (VITAMIN D2) 1.25 MG (50000 UT) capsule Take 1 capsule (50,000 Units total) by mouth once a week. 12 capsule 0   letrozole (FEMARA) 2.5 MG tablet TAKE 1 TABLET BY MOUTH EVERY DAY 90 tablet 3   magic mouthwash w/lidocaine SOLN Take 5 mLs by mouth 4 (four) times daily as needed for mouth pain. 240 mL 1   Multiple Vitamins-Minerals (WOMENS DAILY FORMULA PO) Take by mouth daily.     ONETOUCH VERIO test strip 1 each by Other route 2 (two) times daily.     Prasterone (INTRAROSA) 6.5 MG INST Place 1 insert (6.5 mg total) vaginally daily, as directed. 28 each 12   TRADJENTA 5 MG TABS tablet Take  5 mg by mouth daily.     No current facility-administered medications for this visit.   Facility-Administered Medications Ordered in Other Visits  Medication Dose Route Frequency Provider Last Rate Last Admin   goserelin (ZOLADEX) injection 3.6 mg  3.6 mg Subcutaneous Q28 days Causey, Larna Daughters, NP        PHYSICAL EXAMINATION: ECOG PERFORMANCE STATUS: 1 - Symptomatic but completely ambulatory  Vitals:   11/26/23 1524 11/26/23 1526  BP: (!) 89/59 (!) 87/63  Pulse: 98   Resp: 18   Temp: 98.2 F (36.8 C)   SpO2: 100%    Filed Weights   11/26/23 1524  Weight: 147 lb 6.4 oz (66.9 kg)    Physical Exam   MEASUREMENTS: WT- about 10 pounds lost      (exam performed in the presence of a chaperone)  LABORATORY DATA:  I have reviewed the data as listed    Latest Ref Rng & Units 11/26/2023    2:57 PM 09/11/2023    5:51 PM 07/09/2023   12:57 PM  CMP  Glucose 70 - 99 mg/dL 161   096   BUN 6 - 20 mg/dL 10   11   Creatinine 0.45 - 1.00 mg/dL 4.09  8.11  0.60   Sodium 135 - 145 mmol/L 136   138   Potassium 3.5 - 5.1 mmol/L 4.0   4.1   Chloride 98 - 111 mmol/L 99   101   CO2 22 - 32 mmol/L 30   24   Calcium 8.9 - 10.3 mg/dL 9.8   16.1   Total Protein 6.5 - 8.1 g/dL 6.7   7.8   Total Bilirubin <1.2 mg/dL 0.4   0.5   Alkaline Phos 38 - 126 U/L 69   68   AST 15 - 41 U/L 17   24   ALT 0 - 44 U/L 20   29     Lab Results  Component Value Date   WBC 5.4 11/26/2023   HGB 15.0 11/26/2023   HCT 43.2 11/26/2023   MCV 78.1 (L) 11/26/2023   PLT 219 11/26/2023   NEUTROABS 2.8 11/26/2023    ASSESSMENT & PLAN:  Malignant neoplasm of overlapping sites of right breast in female, estrogen receptor positive (HCC) 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, margins negative, 4/7 lymph nodes, ER 75 to 95%, PR 9095%, HER2 negative, Ki-67 25% Genetics: Negative MammaPrint: Low risk (this is not a valid test  for 4+ lymph nodes) Repeat prognostic panel on the lymph node in the final pathology also came back as ER/PR positive   Treatment plan: 1.  Adjuvant chemotherapy with dose dense Adriamycin and Cytoxan x4 followed by Taxol weekly x12 started 08/25/2021-01/19/2022 2. adjuvant radiation therapy 02/27/2022-04/04/2022 3.  Followed by adjuvant antiestrogen therapy with complete estrogen blockade and abemaciclib (abemaciclib started 12/13/2022 discontinued due to toxicities) ------------------------------------------------------------------------------------------------------------------------ Current treatment: Zoladex with letrozole started June 2023 Toxicities:  Breast cancer surveillance: Plan is to do annual CT scans. CT CAP and bone scan November 2024: Benign  Return to clinic monthly for Zoladex injections and every 6 months for follow-up with me.    Orders Placed This Encounter  Procedures   CBC with Differential (Cancer Center Only)    Standing Status:   Future    Expiration Date:   11/25/2024   CMP (Cancer Center only)    Standing Status:   Future    Expiration Date:   11/25/2024   The patient has a good understanding of the overall plan. she agrees with it. she will call with any problems that may develop before the next visit here. Total time spent: 30 mins including face to face time and time spent for planning, charting and co-ordination of care   Tamsen Meek, MD 11/26/23

## 2023-11-26 NOTE — Progress Notes (Signed)
Per MD JAK2 testing to be canceled. Pt does not need testing at this time.

## 2023-11-26 NOTE — Assessment & Plan Note (Signed)
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, margins negative, 4/7 lymph nodes, ER 75 to 95%, PR 9095%, HER2 negative, Ki-67 25% Genetics: Negative MammaPrint: Low risk (this is not a valid test for 4+ lymph nodes) Repeat prognostic panel on the lymph node in the final pathology also came back as ER/PR positive   Treatment plan: 1.  Adjuvant chemotherapy with dose dense Adriamycin and Cytoxan x4 followed by Taxol weekly x12 started 08/25/2021-01/19/2022 2. adjuvant radiation therapy 02/27/2022-04/04/2022 3.  Followed by adjuvant antiestrogen therapy with complete estrogen blockade and abemaciclib (abemaciclib started 12/13/2022 discontinued due to toxicities) ------------------------------------------------------------------------------------------------------------------------ Current treatment: Zoladex with letrozole started June 2023 Toxicities:  Breast cancer surveillance: Plan is to do annual CT scans. CT CAP and bone scan November 2024: Benign  Return to clinic monthly for Zoladex injections and every 6 months for follow-up with me.

## 2023-11-27 ENCOUNTER — Other Ambulatory Visit (HOSPITAL_COMMUNITY): Payer: Self-pay

## 2023-12-17 ENCOUNTER — Encounter: Payer: Self-pay | Admitting: Physical Therapy

## 2023-12-17 ENCOUNTER — Other Ambulatory Visit: Payer: Self-pay

## 2023-12-17 ENCOUNTER — Ambulatory Visit: Payer: 59 | Attending: Family Medicine | Admitting: Physical Therapy

## 2023-12-17 DIAGNOSIS — M25611 Stiffness of right shoulder, not elsewhere classified: Secondary | ICD-10-CM | POA: Diagnosis not present

## 2023-12-17 DIAGNOSIS — M7918 Myalgia, other site: Secondary | ICD-10-CM

## 2023-12-17 DIAGNOSIS — G8929 Other chronic pain: Secondary | ICD-10-CM | POA: Insufficient documentation

## 2023-12-17 DIAGNOSIS — M6281 Muscle weakness (generalized): Secondary | ICD-10-CM | POA: Insufficient documentation

## 2023-12-17 DIAGNOSIS — M25512 Pain in left shoulder: Secondary | ICD-10-CM | POA: Insufficient documentation

## 2023-12-17 NOTE — Therapy (Signed)
 OUTPATIENT PHYSICAL THERAPY SHOULDER EVALUATION   Patient Name: Allison Reed MRN: [id number] DOB:1976-03-11, 48 y.o., female Today's Date: 12/17/2023  END OF SESSION:  PT End of Session - 12/17/23 1237     Visit Number 1    Date for PT Re-Evaluation 02/11/24    Authorization Type UHC    PT Start Time 1235    PT Stop Time 1315    PT Time Calculation (min) 40 min    Activity Tolerance Patient tolerated treatment well             Past Medical History:  Diagnosis Date   Diabetes mellitus without complication (HCC)    Type 2   Family history of brain tumor    Family history of skin cancer    Gestational diabetes    Headache    otc med prn   Heartburn in pregnancy    Incompetent cervix in pregnancy 04/2015   Missed abortion    no surgery required   Personal history of chemotherapy    Personal history of radiation therapy    Postpartum care following cesarean delivery (11/2) 10/12/2015   right breast ca 06/2021   UTI in pregnancy 03/2015   recent UTI, treatment completed   Past Surgical History:  Procedure Laterality Date   APPENDECTOMY  12/10/2004   BREAST BIOPSY Right 2022   BREAST BIOPSY Left 2022   BREAST RECONSTRUCTION WITH PLACEMENT OF TISSUE EXPANDER AND ALLODERM Right 07/25/2021   Procedure: RIGHT BREAST RECONSTRUCTION WITH PLACEMENT OF TISSUE EXPANDER AND ALLODERM;  Surgeon: Arelia Filippo, MD;  Location: [facility];  Service: Plastics;  Laterality: Right;   CERCLAGE LAPAROSCOPIC ABDOMINAL N/A 04/15/2015   Procedure: LAPAROSCOPIC TRANSABDOMINAL CERVCOISTHMIC CERCLAGE;  Surgeon: Cynthia Loss, MD;  Location: [facility];  Service: Gynecology;  Laterality: N/A;   CERVICAL CERCLAGE  02/08/2011   vaginal   CESAREAN SECTION N/A 10/12/2015   Procedure: CESAREAN SECTION;  Surgeon: Percilla Burly, MD;  Location: [facility];  Service: Obstetrics;  Laterality: N/A;   CESAREAN SECTION N/A 03/03/2018   Procedure: Repeat CESAREAN SECTION/Removal of  Abdominal Cerclage;  Surgeon: Barbette Knock, MD;  Location: [facility];  Service: Obstetrics;  Laterality: N/A;  EDD: 03/28/18   MASTECTOMY Right 2022   MASTECTOMY W/ SENTINEL NODE BIOPSY Right 07/25/2021   Procedure: RIGHT MASTECTOMY WITH AXILLARY SENTINEL LYMPH NODE BIOPSY;  Surgeon: Ebbie Cough, MD;  Location: [facility];  Service: General;  Laterality: Right;   PORTACATH PLACEMENT N/A 08/24/2021   Procedure: INSERTION PORT-A-CATH;  Surgeon: Ebbie Cough, MD;  Location: [facility];  Service: General;  Laterality: N/A;   RADIOACTIVE SEED GUIDED AXILLARY SENTINEL LYMPH NODE Right 07/25/2021   Procedure: RADIOACTIVE SEED GUIDED RIGHT AXILLARY NODE EXCISION;  Surgeon: Ebbie Cough, MD;  Location: [facility];  Service: General;  Laterality: Right;   TISSUE EXPANDER PLACEMENT Right 12/04/2021   Procedure: REMOVAL RIGHT CHEST TISSUE EXPANDER;  Surgeon: Arelia Filippo, MD;  Location: [facility];  Service: Plastics;  Laterality: Right;   TISSUE EXPANDER REMOVAL     WISDOM TOOTH EXTRACTION  12/11/1999   Patient Active Problem List   Diagnosis Date Noted   Weakness of right shoulder 08/21/2023   Lower leg mass, left 05/02/2022   Port-A-Cath in place 08/25/2021   S/P mastectomy, right 07/25/2021   Genetic testing 07/01/2021   Family history of skin cancer 06/22/2021   Family history of brain tumor 06/22/2021   Malignant neoplasm of overlapping sites of right breast in  female, estrogen receptor positive (HCC) 06/21/2021   Status post repeat low transverse cesarean section 03/03/2018   DM (diabetes mellitus) in pregnancy 03/02/2018   Incompetent cervix in pregnancy 03/02/2018    PCP: Odean Potts MD  REFERRING PROVIDER: Claudene Hussar DO  REFERRING DIAG: F74.487, G89.29 chronic left shoulder pain but pt states her problem is RIGHT shoulder pain  THERAPY DIAG:  right shoulder stiffness; fascial restriction  Rationale for  Evaluation and Treatment: Rehabilitation  ONSET DATE: > 1 year  SUBJECTIVE:                                                                                                                                                                                                         SUBJECTIVE STATEMENT: Had previous cancer rehab and PT at this facility for right shoulder pain. Had MRI in September no rotator cuff tear;  Sees osteopath who was able to relieve pain but has continued decreased right shoulder ROM;  osteopath does DN near spine and pecs but not around scapula or thorax. Tightness approx 6-7 months.  Patient interested in Graston for myofascial release.   Hand dominance: Right Uses pulleys, cat cow, open book, 3 down dog; wall stretching once a day Self Limits weight carries to 2-3 pounds, no overhead lifting PERTINENT HISTORY:  Pt is s/p Right Mastectomy with Tissue Expander and 4+/7 LN on 07/25/21. She is s/p 6 months of chemo followed by radiation. She had emergency surgery to remove tissue expander due to infection on 12/04/2021. She completed radiation on 04/04/22.  right breast reconstruction on 06/24/2023.  7 lymph node removal;  + cording + diabetes 20 years On Ozempic since Sept.   PAIN:   Are you having pain? no NPRS scale: 0/10  PRECAUTIONS: Right lymphedema risk    WEIGHT BEARING RESTRICTIONS: No  FALLS:  Has patient fallen in last 6 months? No  LIVING ENVIRONMENT: Lives with: husband and 2 children Lives in: House/apartment Stairs: Yes; Internal: 14 steps; can reach both and External: 0 steps; none  OCCUPATION: Administrative Asst, works from home   LEISURE: walking, work out with psychologist, educational,  PLOF: Independent  PATIENT GOALS: improve shoulder mobility; pt is a horticulturist, commercial  OBJECTIVE:  Note: Objective measures were completed at Evaluation unless otherwise noted.  DIAGNOSTIC FINDINGS:  September 2024 IMPRESSION: 1. Mild supraspinatus tendinopathy. No rotator cuff  tear. 2. Mild tendinopathy of the intra-articular segment of the long head of the biceps. 3. Mild degenerative chondral thinning in the glenohumeral joint. 4. Mild degenerative spurring in the [facility] joint. 5. Trace subacromial subdeltoid  bursitis.  PATIENT SURVEYS:  FOTO 51%  COGNITION: Overall cognitive status: Within functional limits for tasks assessed  PALPATION: Moderate to severe scapular hypomobility; decreased lat, teres soft tissue mobility   CERVICAL ROM: decreased cervical rotation by 25% right/left UPPER EXTREMITY ROM:  Active ROM Right eval Left eval  Shoulder flexion 110 158  Shoulder extension    Shoulder abduction 108 170  Shoulder adduction    Shoulder extension    Shoulder internal rotation Right iliac crest T3  Shoulder external rotation 38; scapular spine right  73/T3  Elbow flexion    Elbow extension    Wrist flexion    Wrist extension    Wrist ulnar deviation    Wrist radial deviation    Wrist pronation    Wrist supination     (Blank rows = not tested)  Active ROM: Supine 131 flexion  UPPER EXTREMITY MMT: grossly 4/5 in available ROM  TREATMENT DATE: 12/17/23     Eval completed Discussed principles of increasing ROM: every 2 hours 3-5 min of stretching;  low load but long duration stretching; prioritize supine flexion stretch (pt currently does supine cane with 1# weight)                                                                                                                             PATIENT EDUCATION:  Education details: Educated patient on anatomy and physiology of current symptoms, prognosis, plan of care as well as initial self care strategies to promote recovery Person educated: Patient Education method: Explanation Education comprehension: verbalized understanding  HOME EXERCISE PROGRAM: To be started  ASSESSMENT:  CLINICAL IMPRESSION: Patient is a 48 y.o. female who was seen today for physical therapy evaluation and  treatment for right shoulder stiffness consistent with adhesive capsulitis.  Her pain has improved after working with an osteopath however limited ROM persists in all planes of movement:  flexion 110 degrees, abduction 108 degrees, internal rotation to iliac crest, external rotation 38 degrees.  Decreased glenohumeral and scapular mobility in all planes.  Decreased myofascial mobility of right lats, teres and pectorals.  She would benefit from PT to address these impairment which affect ability to dress, grooming, reaching overhead and dance.   OBJECTIVE IMPAIRMENTS: decreased activity tolerance, decreased ROM, increased fascial restrictions, impaired perceived functional ability, impaired UE functional use, and pain.   ACTIVITY LIMITATIONS: carrying, lifting, reach over head, and hygiene/grooming  PARTICIPATION LIMITATIONS: meal prep, cleaning, and laundry  PERSONAL FACTORS: 1-2 comorbidities: risk factors for adhesive capsulitis including age, gender, diabetes, breast cancer/surgeries  are also affecting patient's functional outcome.   REHAB POTENTIAL: Good  CLINICAL DECISION MAKING: Stable/uncomplicated  EVALUATION COMPLEXITY: Low   GOALS: Goals reviewed with patient? Yes  SHORT TERM GOALS: Target date: 01/28/2024    The patient will demonstrate knowledge of basic self care strategies and exercises to promote healing  Baseline:  Goal status: INITIAL  2.  The patient will have improved shoulder elevation ROM  to at least 130 degrees needed for grooming/dressing purposes as well as reaching higher shelves  Baseline:  Goal status: INITIAL  3.  Right shoulder internal rotation improved with reach behind back to L4 needed for dressing,hygiene  Baseline:  Goal status: INITIAL  4.  The patient will have strength needed to carry a 5# object/shopping bag Baseline:  Goal status: INITIAL     LONG TERM GOALS: Target date: 03/10/2024      The patient will be independent in a safe  self progression of a home exercise program to promote further recovery of function  Baseline:  Goal status: INITIAL  2.  The patient will have improved shoulder elevation ROM to at least 150 degrees needed for grooming/dressing purposes as well as reaching high shelves  Baseline:  Goal status: INITIAL  3.  Right shoulder external rotation to 50 degrees needed for fixing her hair, dressing tasks Baseline:  Goal status: INITIAL  4.  The patient will have strength needed to lift and lower a 5# object from a high shelf  Baseline:  Goal status: INITIAL  5.  The patient will have improved FOTO score to   67%    indicating improved function with less pain  Baseline:  Goal status: INITIAL    PLAN:  PT FREQUENCY: 1x/week initially, then decrease to every 2 weeks  PT DURATION: 12 weeks  PLANNED INTERVENTIONS: 97164- PT Re-evaluation, 97110-Therapeutic exercises, 97530- Therapeutic activity, 97112- Neuromuscular re-education, 97535- Self Care, 02859- Manual therapy, [id number]- Aquatic Therapy, 97014- Electrical stimulation (unattended), [id number]- Electrical stimulation (manual), [id number]- Ultrasound, 02966- Ionotophoresis 4mg /ml Dexamethasone , Patient/Family education, Taping, Dry Needling, Joint mobilization, Spinal mobilization, Cryotherapy, and Moist heat  PLAN FOR NEXT SESSION: instrument assisted myofascial release (Graston) sidelying and child's pose;  lat specific stretching; glenohumeral and scapular mobility; long duration holds  Glade Pesa, PT 12/17/23 7:07 PM Phone: [phone] Fax: [phone]

## 2023-12-24 ENCOUNTER — Other Ambulatory Visit: Payer: Self-pay

## 2023-12-24 ENCOUNTER — Inpatient Hospital Stay: Payer: 59 | Attending: Hematology and Oncology

## 2023-12-24 VITALS — BP 87/70 | HR 102 | Temp 98.7°F | Resp 16

## 2023-12-24 DIAGNOSIS — Z1732 Human epidermal growth factor receptor 2 negative status: Secondary | ICD-10-CM | POA: Insufficient documentation

## 2023-12-24 DIAGNOSIS — Z79811 Long term (current) use of aromatase inhibitors: Secondary | ICD-10-CM | POA: Insufficient documentation

## 2023-12-24 DIAGNOSIS — Z1721 Progesterone receptor positive status: Secondary | ICD-10-CM | POA: Insufficient documentation

## 2023-12-24 DIAGNOSIS — Z5111 Encounter for antineoplastic chemotherapy: Secondary | ICD-10-CM | POA: Insufficient documentation

## 2023-12-24 DIAGNOSIS — Z170 Estrogen receptor positive status [ER+]: Secondary | ICD-10-CM | POA: Insufficient documentation

## 2023-12-24 DIAGNOSIS — C50811 Malignant neoplasm of overlapping sites of right female breast: Secondary | ICD-10-CM | POA: Diagnosis present

## 2023-12-24 DIAGNOSIS — Z923 Personal history of irradiation: Secondary | ICD-10-CM | POA: Diagnosis not present

## 2023-12-24 DIAGNOSIS — Z9221 Personal history of antineoplastic chemotherapy: Secondary | ICD-10-CM | POA: Diagnosis not present

## 2023-12-24 MED ORDER — GOSERELIN ACETATE 3.6 MG ~~LOC~~ IMPL
3.6000 mg | DRUG_IMPLANT | Freq: Once | SUBCUTANEOUS | Status: AC
  Administered 2023-12-24: 3.6 mg via SUBCUTANEOUS
  Filled 2023-12-24: qty 3.6

## 2023-12-25 ENCOUNTER — Ambulatory Visit: Payer: 59 | Admitting: Physical Therapy

## 2023-12-25 DIAGNOSIS — G8929 Other chronic pain: Secondary | ICD-10-CM | POA: Diagnosis not present

## 2023-12-25 DIAGNOSIS — M6281 Muscle weakness (generalized): Secondary | ICD-10-CM

## 2023-12-25 DIAGNOSIS — M7918 Myalgia, other site: Secondary | ICD-10-CM

## 2023-12-25 DIAGNOSIS — M25611 Stiffness of right shoulder, not elsewhere classified: Secondary | ICD-10-CM

## 2023-12-25 NOTE — Therapy (Signed)
 OUTPATIENT PHYSICAL THERAPY SHOULDER PROGRESS NOTE   Patient Name: Allison Reed MRN: [id number] DOB:[birth year], 48 y.o., female Today's Date: 12/25/2023  END OF SESSION:  PT End of Session - 12/25/23 0805     Visit Number 2    Date for PT Re-Evaluation 02/11/24    Authorization Type UHC    PT Start Time 0805    PT Stop Time 0844    PT Time Calculation (min) 39 min    Activity Tolerance Patient tolerated treatment well             Past Medical History:  Diagnosis Date   Diabetes mellitus without complication (HCC)    Type 2   Family history of brain tumor    Family history of skin cancer    Gestational diabetes    Headache    otc med prn   Heartburn in pregnancy    Incompetent cervix in pregnancy 04/2015   Missed abortion    no surgery required   Personal history of chemotherapy    Personal history of radiation therapy    Postpartum care following cesarean delivery (11/2) 10/12/2015   right breast ca 06/2021   UTI in pregnancy 03/2015   recent UTI, treatment completed   Past Surgical History:  Procedure Laterality Date   APPENDECTOMY  12/10/2004   BREAST BIOPSY Right 2022   BREAST BIOPSY Left 2022   BREAST RECONSTRUCTION WITH PLACEMENT OF TISSUE EXPANDER AND ALLODERM Right 07/25/2021   Procedure: RIGHT BREAST RECONSTRUCTION WITH PLACEMENT OF TISSUE EXPANDER AND ALLODERM;  Surgeon: Alger Infield, MD;  Location: [facility];  Service: Plastics;  Laterality: Right;   CERCLAGE LAPAROSCOPIC ABDOMINAL N/A 04/15/2015   Procedure: LAPAROSCOPIC TRANSABDOMINAL CERVCOISTHMIC CERCLAGE;  Surgeon: Asa Bjork, MD;  Location: [facility];  Service: Gynecology;  Laterality: N/A;   CERVICAL CERCLAGE  02/08/2011   vaginal   CESAREAN SECTION N/A 10/12/2015   Procedure: CESAREAN SECTION;  Surgeon: Percy Bracken, MD;  Location: [facility];  Service: Obstetrics;  Laterality: N/A;   CESAREAN SECTION N/A 03/03/2018   Procedure: Repeat CESAREAN SECTION/Removal of  Abdominal Cerclage;  Surgeon: Terri Fester, MD;  Location: [facility];  Service: Obstetrics;  Laterality: N/A;  EDD: 03/28/18   MASTECTOMY Right 2022   MASTECTOMY W/ SENTINEL NODE BIOPSY Right 07/25/2021   Procedure: RIGHT MASTECTOMY WITH AXILLARY SENTINEL LYMPH NODE BIOPSY;  Surgeon: Enid Harry, MD;  Location: [facility];  Service: General;  Laterality: Right;   PORTACATH PLACEMENT N/A 08/24/2021   Procedure: INSERTION PORT-A-CATH;  Surgeon: Enid Harry, MD;  Location: [facility];  Service: General;  Laterality: N/A;   RADIOACTIVE SEED GUIDED AXILLARY SENTINEL LYMPH NODE Right 07/25/2021   Procedure: RADIOACTIVE SEED GUIDED RIGHT AXILLARY NODE EXCISION;  Surgeon: Enid Harry, MD;  Location: [facility];  Service: General;  Laterality: Right;   TISSUE EXPANDER PLACEMENT Right 12/04/2021   Procedure: REMOVAL RIGHT CHEST TISSUE EXPANDER;  Surgeon: Alger Infield, MD;  Location: [facility];  Service: Plastics;  Laterality: Right;   TISSUE EXPANDER REMOVAL     WISDOM TOOTH EXTRACTION  12/11/1999   Patient Active Problem List   Diagnosis Date Noted   Weakness of right shoulder 08/21/2023   Lower leg mass, left 05/02/2022   Port-A-Cath in place 08/25/2021   S/P mastectomy, right 07/25/2021   Genetic testing 07/01/2021   Family history of skin cancer 06/22/2021   Family history of brain tumor 06/22/2021   Malignant neoplasm of overlapping sites of right breast  in female, estrogen receptor positive (HCC) 06/21/2021   Status post repeat low transverse cesarean section 03/03/2018   DM (diabetes mellitus) in pregnancy 03/02/2018   Incompetent cervix in pregnancy 03/02/2018    PCP: Cameron Cea MD  REFERRING PROVIDER: Ronnell Coins DO  REFERRING DIAG: Z61.096, G89.29 chronic left shoulder pain but pt states her problem is RIGHT shoulder pain  THERAPY DIAG:  right shoulder stiffness; fascial restriction  Rationale for  Evaluation and Treatment: Rehabilitation  ONSET DATE: > 1 year  SUBJECTIVE:                                                                                                                                                                                                         SUBJECTIVE STATEMENT: Last week was a busy week bc of work and the kids being out of school (for weather).  Trying to do the ex's as much as possible.     EVAL:Had previous cancer rehab and PT at this facility for right shoulder pain. Had MRI in September no rotator cuff tear;  Sees osteopath who was able to relieve pain but has continued decreased right shoulder ROM;  osteopath does DN near spine and pecs but not around scapula or thorax. Tightness approx 6-7 months.  Patient interested in Graston for myofascial release.   Hand dominance: Right Uses pulleys, cat cow, open book, 3 down dog; wall stretching once a day Self Limits weight carries to 2-3 pounds, no overhead lifting PERTINENT HISTORY:  Pt is s/p Right Mastectomy with Tissue Expander and 4+/7 LN on 07/25/21. She is s/p 6 months of chemo followed by radiation. She had emergency surgery to remove tissue expander due to infection on 12/04/2021. She completed radiation on 04/04/22.  right breast reconstruction on 06/24/2023.  7 lymph node removal;  + cording + diabetes 20 years On Ozempic since Sept.   PAIN:   Are you having pain? no NPRS scale: 0/10  PRECAUTIONS: Right lymphedema risk    WEIGHT BEARING RESTRICTIONS: No  FALLS:  Has patient fallen in last 6 months? No  LIVING ENVIRONMENT: Lives with: husband and 2 children Lives in: House/apartment Stairs: Yes; Internal: 14 steps; can reach both and External: 0 steps; none  OCCUPATION: Administrative Asst, works from home   LEISURE: walking, work out with Psychologist, educational,  PLOF: Independent  PATIENT GOALS: improve shoulder mobility; pt is a Horticulturist, commercial  OBJECTIVE:  Note: Objective measures were completed at  Evaluation unless otherwise noted.  DIAGNOSTIC FINDINGS:  September 2024 IMPRESSION: 1. Mild supraspinatus tendinopathy. No rotator cuff tear. 2. Mild  tendinopathy of the intra-articular segment of the long head of the biceps. 3. Mild degenerative chondral thinning in the glenohumeral joint. 4. Mild degenerative spurring in the [facility] joint. 5. Trace subacromial subdeltoid bursitis.  PATIENT SURVEYS:  FOTO 51%  COGNITION: Overall cognitive status: Within functional limits for tasks assessed  PALPATION: Moderate to severe scapular hypomobility; decreased lat, teres soft tissue mobility   CERVICAL ROM: decreased cervical rotation by 25% right/left UPPER EXTREMITY ROM:  Active ROM Right eval Left eval  Shoulder flexion 110 158  Shoulder extension    Shoulder abduction 108 170  Shoulder adduction    Shoulder extension    Shoulder internal rotation Right iliac crest T3  Shoulder external rotation 38; scapular spine right  73/T3  Elbow flexion    Elbow extension    Wrist flexion    Wrist extension    Wrist ulnar deviation    Wrist radial deviation    Wrist pronation    Wrist supination     (Blank rows = not tested)  Active ROM: Supine 131 flexion  UPPER EXTREMITY MMT: grossly 4/5 in available ROM  TREATMENT DATE:  12/25/23: Manual therapy: Graston instrument #5 seated myofascial sweeping and fanning in seated position: upper traps, levator scap, rhomboids 45 degree angle Supine: Graston #5  10 degree angle (light) to pectorals Child's pose Graston #5 45 degree angle to lats and teres Therapeutic exercise:  seated external rotation with trunk lean on table top 30 sec holds Seated flexion on table top 30 sec holds Standing shoulder flexion table walk aways Doorway shoulder extension with dowel  Dowel shoulder external rotation        12/17/23     Eval completed Discussed principles of increasing ROM: every 2 hours 3-5 min of stretching;  low load but long duration  stretching; prioritize supine flexion stretch (pt currently does supine cane with 1# weight)                                                                                                                             PATIENT EDUCATION:  Education details: Educated patient on anatomy and physiology of current symptoms, prognosis, plan of care as well as initial self care strategies to promote recovery Person educated: Patient Education method: Explanation Education comprehension: verbalized understanding  HOME EXERCISE PROGRAM: Discussed principles of increasing ROM: every 2 hours 3-5 min of stretching;  low load but long duration stretching; prioritize supine flexion stretch (pt currently does supine cane with 1# weight)     ASSESSMENT:  CLINICAL IMPRESSION: The patient had a positive initial response to Graston myofascial release with much improved soft tissue mobility and decreased size and number of tender points.  Therapist monitoring response and educating patient on what to expect following Graston and how to optimize benefit with specific exercise.  Anticipate mobility will continue to improve over the next few days.     OBJECTIVE IMPAIRMENTS: decreased activity tolerance, decreased ROM, increased fascial restrictions,  impaired perceived functional ability, impaired UE functional use, and pain.   ACTIVITY LIMITATIONS: carrying, lifting, reach over head, and hygiene/grooming  PARTICIPATION LIMITATIONS: meal prep, cleaning, and laundry  PERSONAL FACTORS: 1-2 comorbidities: risk factors for adhesive capsulitis including age, gender, diabetes, breast cancer/surgeries  are also affecting patient's functional outcome.   REHAB POTENTIAL: Good  CLINICAL DECISION MAKING: Stable/uncomplicated  EVALUATION COMPLEXITY: Low   GOALS: Goals reviewed with patient? Yes  SHORT TERM GOALS: Target date: 01/28/2024    The patient will demonstrate knowledge of basic self care strategies and  exercises to promote healing  Baseline:  Goal status: INITIAL  2.  The patient will have improved shoulder elevation ROM to at least 130 degrees needed for grooming/dressing purposes as well as reaching higher shelves  Baseline:  Goal status: INITIAL  3.  Right shoulder internal rotation improved with reach behind back to L4 needed for dressing,hygiene  Baseline:  Goal status: INITIAL  4.  The patient will have strength needed to carry a 5# object/shopping bag Baseline:  Goal status: INITIAL     LONG TERM GOALS: Target date: 03/10/2024      The patient will be independent in a safe self progression of a home exercise program to promote further recovery of function  Baseline:  Goal status: INITIAL  2.  The patient will have improved shoulder elevation ROM to at least 150 degrees needed for grooming/dressing purposes as well as reaching high shelves  Baseline:  Goal status: INITIAL  3.  Right shoulder external rotation to 50 degrees needed for fixing her hair, dressing tasks Baseline:  Goal status: INITIAL  4.  The patient will have strength needed to lift and lower a 5# object from a high shelf  Baseline:  Goal status: INITIAL  5.  The patient will have improved FOTO score to   67%    indicating improved function with less pain  Baseline:  Goal status: INITIAL    PLAN:  PT FREQUENCY: 1x/week initially, then decrease to every 2 weeks  PT DURATION: 12 weeks  PLANNED INTERVENTIONS: 97164- PT Re-evaluation, 97110-Therapeutic exercises, 97530- Therapeutic activity, 97112- Neuromuscular re-education, 97535- Self Care, 11914- Manual therapy, [id number]- Aquatic Therapy, 97014- Electrical stimulation (unattended), [id number]- Electrical stimulation (manual), [id number]- Ultrasound, 78295- Ionotophoresis 4mg /ml Dexamethasone , Patient/Family education, Taping, Dry Needling, Joint mobilization, Spinal mobilization, Cryotherapy, and Moist heat  PLAN FOR NEXT SESSION: check ROM; instrument  assisted myofascial release (Graston) sidelying and child's pose;  lat specific stretching; glenohumeral and scapular mobility; long duration holds  Darien Eden, PT 12/25/23 5:13 PM Phone: [phone] Fax: [phone]

## 2023-12-31 ENCOUNTER — Ambulatory Visit: Payer: 59 | Admitting: Physical Therapy

## 2023-12-31 DIAGNOSIS — M25611 Stiffness of right shoulder, not elsewhere classified: Secondary | ICD-10-CM

## 2023-12-31 DIAGNOSIS — M7918 Myalgia, other site: Secondary | ICD-10-CM

## 2023-12-31 DIAGNOSIS — M6281 Muscle weakness (generalized): Secondary | ICD-10-CM

## 2023-12-31 DIAGNOSIS — G8929 Other chronic pain: Secondary | ICD-10-CM | POA: Diagnosis not present

## 2023-12-31 NOTE — Therapy (Signed)
OUTPATIENT PHYSICAL THERAPY SHOULDER PROGRESS NOTE   Patient Name: Allison Reed MRN: [id number] DOB:1976/05/26, 48 y.o., female Today's Date: 12/31/2023  END OF SESSION:  PT End of Session - 12/31/23 0801     Visit Number 3    Date for PT Re-Evaluation 02/11/24    Authorization Type [employer]    PT Start Time 0801    PT Stop Time 0844    PT Time Calculation (min) 43 min    Activity Tolerance Patient tolerated treatment well             Past Medical History:  Diagnosis Date   Diabetes mellitus without complication (HCC)    Type 2   Family history of brain tumor    Family history of skin cancer    Gestational diabetes    Headache    otc med prn   Heartburn in pregnancy    Incompetent cervix in pregnancy 04/2015   Missed abortion    no surgery required   Personal history of chemotherapy    Personal history of radiation therapy    Postpartum care following cesarean delivery (11/2) 10/12/2015   right breast ca 06/2021   UTI in pregnancy 03/2015   recent UTI, treatment completed   Past Surgical History:  Procedure Laterality Date   APPENDECTOMY  12/10/2004   BREAST BIOPSY Right 2022   BREAST BIOPSY Left 2022   BREAST RECONSTRUCTION WITH PLACEMENT OF TISSUE EXPANDER AND ALLODERM Right 07/25/2021   Procedure: RIGHT BREAST RECONSTRUCTION WITH PLACEMENT OF TISSUE EXPANDER AND ALLODERM;  Surgeon: Glenna Fellows, MD;  Location: [facility];  Service: Plastics;  Laterality: Right;   CERCLAGE LAPAROSCOPIC ABDOMINAL N/A 04/15/2015   Procedure: LAPAROSCOPIC TRANSABDOMINAL CERVCOISTHMIC CERCLAGE;  Surgeon: Fermin Schwab, MD;  Location: [facility];  Service: Gynecology;  Laterality: N/A;   CERVICAL CERCLAGE  02/08/2011   vaginal   CESAREAN SECTION N/A 10/12/2015   Procedure: CESAREAN SECTION;  Surgeon: Genia Del, MD;  Location: [facility];  Service: Obstetrics;  Laterality: N/A;   CESAREAN SECTION N/A 03/03/2018   Procedure: Repeat CESAREAN SECTION/Removal of  Abdominal Cerclage;  Surgeon: Shea Evans, MD;  Location: [facility];  Service: Obstetrics;  Laterality: N/A;  EDD: 03/28/18   MASTECTOMY Right 2022   MASTECTOMY W/ SENTINEL NODE BIOPSY Right 07/25/2021   Procedure: RIGHT MASTECTOMY WITH AXILLARY SENTINEL LYMPH NODE BIOPSY;  Surgeon: Emelia Loron, MD;  Location: [facility];  Service: General;  Laterality: Right;   PORTACATH PLACEMENT N/A 08/24/2021   Procedure: INSERTION PORT-A-CATH;  Surgeon: Emelia Loron, MD;  Location: [facility];  Service: General;  Laterality: N/A;   RADIOACTIVE SEED GUIDED AXILLARY SENTINEL LYMPH NODE Right 07/25/2021   Procedure: RADIOACTIVE SEED GUIDED RIGHT AXILLARY NODE EXCISION;  Surgeon: Emelia Loron, MD;  Location: [facility];  Service: General;  Laterality: Right;   TISSUE EXPANDER PLACEMENT Right 12/04/2021   Procedure: REMOVAL RIGHT CHEST TISSUE EXPANDER;  Surgeon: Glenna Fellows, MD;  Location: [facility];  Service: Plastics;  Laterality: Right;   TISSUE EXPANDER REMOVAL     WISDOM TOOTH EXTRACTION  12/11/1999   Patient Active Problem List   Diagnosis Date Noted   Weakness of right shoulder 08/21/2023   Lower leg mass, left 05/02/2022   Port-A-Cath in place 08/25/2021   S/P mastectomy, right 07/25/2021   Genetic testing 07/01/2021   Family history of skin cancer 06/22/2021   Family history of brain tumor 06/22/2021   Malignant neoplasm of overlapping sites of right breast  in female, estrogen receptor positive (HCC) 06/21/2021   Status post repeat low transverse cesarean section 03/03/2018   DM (diabetes mellitus) in pregnancy 03/02/2018   Incompetent cervix in pregnancy 03/02/2018    PCP: [name] MD  REFERRING PROVIDER: Antoine Primas DO  REFERRING DIAG: Z30.865, G89.29 chronic left shoulder pain but pt states her problem is RIGHT shoulder pain  THERAPY DIAG:  right shoulder stiffness; fascial restriction  Rationale for  Evaluation and Treatment: Rehabilitation  ONSET DATE: > 1 year  SUBJECTIVE:                                                                                                                                                                                                         SUBJECTIVE STATEMENT:  Sore for a couple of days after last session. I handled it OK.  I wasn't able to do the exercises you showed me but did more reps of the others. The osteopath has been giving me exercises to open the hips to help the shoulder.    EVAL:Had previous cancer rehab and PT at this facility for right shoulder pain. Had MRI in September no rotator cuff tear;  Sees osteopath who was able to relieve pain but has continued decreased right shoulder ROM;  osteopath does DN near spine and pecs but not around scapula or thorax. Tightness approx 6-7 months.  Patient interested in Graston for myofascial release.   Hand dominance: Right Uses pulleys, cat cow, open book, 3 down dog; wall stretching once a day Self Limits weight carries to 2-3 pounds, no overhead lifting PERTINENT HISTORY:  Pt is s/p Right Mastectomy with Tissue Expander and 4+/7 LN on 07/25/21. She is s/p 6 months of chemo followed by radiation. She had emergency surgery to remove tissue expander due to infection on 12/04/2021. She completed radiation on 04/04/22.  right breast reconstruction on 06/24/2023.  7 lymph node removal;  + cording + diabetes 20 years On Ozempic since Sept.   PAIN:   Are you having pain? no NPRS scale: 0/10  PRECAUTIONS: Right lymphedema risk    WEIGHT BEARING RESTRICTIONS: No  FALLS:  Has patient fallen in last 6 months? No  LIVING ENVIRONMENT: Lives with: husband and 2 children Lives in: House/apartment Stairs: Yes; Internal: 14 steps; can reach both and External: 0 steps; none  OCCUPATION: Administrative Asst, works from home   LEISURE: walking, work out with Psychologist, educational,  PLOF: Independent  PATIENT GOALS:  improve shoulder mobility; pt is a Horticulturist, commercial  OBJECTIVE:  Note: Objective measures were completed at Evaluation unless otherwise  noted.  DIAGNOSTIC FINDINGS:  September 2024 IMPRESSION: 1. Mild supraspinatus tendinopathy. No rotator cuff tear. 2. Mild tendinopathy of the intra-articular segment of the long head of the biceps. 3. Mild degenerative chondral thinning in the glenohumeral joint. 4. Mild degenerative spurring in the [facility] joint. 5. Trace subacromial subdeltoid bursitis.  PATIENT SURVEYS:  FOTO 51%  COGNITION: Overall cognitive status: Within functional limits for tasks assessed  PALPATION: Moderate to severe scapular hypomobility; decreased lat, teres soft tissue mobility   CERVICAL ROM: decreased cervical rotation by 25% right/left UPPER EXTREMITY ROM:  Active ROM Right eval Left eval 1/21  Shoulder flexion 110 158 118  Shoulder extension     Shoulder abduction 108 170 124  Shoulder adduction     Shoulder extension     Shoulder internal rotation Right iliac crest T3 Right iliac spine  Shoulder external rotation 38; scapular spine right  73/T3 38:scapular spine  Elbow flexion     Elbow extension     Wrist flexion     Wrist extension     Wrist ulnar deviation     Wrist radial deviation     Wrist pronation     Wrist supination      (Blank rows = not tested)  Active ROM: Supine 131 flexion  UPPER EXTREMITY MMT: grossly 4/5 in available ROM  TREATMENT DATE:  12/31/23: Manual therapy: Graston instrument 4 seated myofascial sweeping and fanning in seated position: upper traps, levator scap, rhomboids, anterior deltoid 45 degree angle Sidelying: teres, lateral deltoid, triceps 40 degrees angle Supine: Graston 4  30 degree angle (light) to pectorals Child's pose Graston 4 45 degree angle to lats and teres Therapeutic exercise:  Shoulder ROM as above Sidelying sleeper stretch 3x  Review of current HEP and "little bits often" principle with  exercise  12/25/23: Manual therapy: Graston instrument #5 seated myofascial sweeping and fanning in seated position: upper traps, levator scap, rhomboids 45 degree angle Supine: Graston 4  10 degree angle (light) to pectorals Child's pose Graston 4 45 degree angle to lats and teres Therapeutic exercise:  seated external rotation with trunk lean on table top 30 sec holds Seated flexion on table top 30 sec holds Standing shoulder flexion table walk aways Doorway shoulder extension with dowel  Dowel shoulder external rotation        12/17/23     Eval completed Discussed principles of increasing ROM: every 2 hours 3-5 min of stretching;  low load but long duration stretching; prioritize supine flexion stretch (pt currently does supine cane with 1# weight)                                                                                                                             PATIENT EDUCATION:  Education details: Educated patient on anatomy and physiology of current symptoms, prognosis, plan of care as well as initial self care strategies to promote recovery Person educated: Patient Education method: Explanation Education comprehension: verbalized understanding  HOME EXERCISE PROGRAM: Discussed principles of increasing ROM: every 2 hours 3-5 min of stretching;  low load but long duration stretching; prioritize supine flexion stretch (pt currently does supine cane with 1# weight)     ASSESSMENT:  CLINICAL IMPRESSION: Improving shoulder flexion and especially abduction ROM since start of care.  The patient was encouraged in regular performance of HEP particularly soft tissue lengthening to enhance long term benefit.  Instructed in internal rotation ROM/stretch (most limited ROM).  Therapist monitoring response and adjusting treatment as needed.   OBJECTIVE IMPAIRMENTS: decreased activity tolerance, decreased ROM, increased fascial restrictions, impaired perceived functional ability,  impaired UE functional use, and pain.   ACTIVITY LIMITATIONS: carrying, lifting, reach over head, and hygiene/grooming  PARTICIPATION LIMITATIONS: meal prep, cleaning, and laundry  PERSONAL FACTORS: 1-2 comorbidities: risk factors for adhesive capsulitis including age, gender, diabetes, breast cancer/surgeries  are also affecting patient's functional outcome.   REHAB POTENTIAL: Good  CLINICAL DECISION MAKING: Stable/uncomplicated  EVALUATION COMPLEXITY: Low   GOALS: Goals reviewed with patient? Yes  SHORT TERM GOALS: Target date: 01/28/2024    The patient will demonstrate knowledge of basic self care strategies and exercises to promote healing  Baseline:  Goal status: INITIAL  2.  The patient will have improved shoulder elevation ROM to at least 130 degrees needed for grooming/dressing purposes as well as reaching higher shelves  Baseline:  Goal status: INITIAL  3.  Right shoulder internal rotation improved with reach behind back to L4 needed for dressing,hygiene  Baseline:  Goal status: INITIAL  4.  The patient will have strength needed to carry a 5# object/shopping bag Baseline:  Goal status: INITIAL     LONG TERM GOALS: Target date: 03/10/2024      The patient will be independent in a safe self progression of a home exercise program to promote further recovery of function  Baseline:  Goal status: INITIAL  2.  The patient will have improved shoulder elevation ROM to at least 150 degrees needed for grooming/dressing purposes as well as reaching high shelves  Baseline:  Goal status: INITIAL  3.  Right shoulder external rotation to 50 degrees needed for fixing her hair, dressing tasks Baseline:  Goal status: INITIAL  4.  The patient will have strength needed to lift and lower a 5# object from a high shelf  Baseline:  Goal status: INITIAL  5.  The patient will have improved FOTO score to   67%    indicating improved function with less pain  Baseline:  Goal  status: INITIAL    PLAN:  PT FREQUENCY: 1x/week initially, then decrease to every 2 weeks  PT DURATION: 12 weeks  PLANNED INTERVENTIONS: 97164- PT Re-evaluation, 97110-Therapeutic exercises, 97530- Therapeutic activity, 97112- Neuromuscular re-education, 97535- Self Care, [id number]- Manual therapy, [id number]- Aquatic Therapy, 97014- Electrical stimulation (unattended), [id number]- Electrical stimulation (manual), [id number]- Ultrasound, 25366- Ionotophoresis 4mg /ml Dexamethasone, Patient/Family education, Taping, Dry Needling, Joint mobilization, Spinal mobilization, Cryotherapy, and Moist heat  PLAN FOR NEXT SESSION: instrument assisted myofascial release (Graston) seated, sidelying and child's pose;  lat specific stretching; glenohumeral and scapular mobility; long duration holds  Lavinia Sharps, PT 12/31/23 2:20 PM Phone: [phone] Fax: [phone]

## 2024-01-07 ENCOUNTER — Ambulatory Visit: Payer: 59 | Admitting: Physical Therapy

## 2024-01-07 DIAGNOSIS — M7918 Myalgia, other site: Secondary | ICD-10-CM

## 2024-01-07 DIAGNOSIS — M25611 Stiffness of right shoulder, not elsewhere classified: Secondary | ICD-10-CM

## 2024-01-07 DIAGNOSIS — M6281 Muscle weakness (generalized): Secondary | ICD-10-CM

## 2024-01-07 DIAGNOSIS — G8929 Other chronic pain: Secondary | ICD-10-CM | POA: Diagnosis not present

## 2024-01-07 NOTE — Therapy (Signed)
OUTPATIENT PHYSICAL THERAPY SHOULDER PROGRESS NOTE   Patient Name: Allison Reed MRN: [id number] DOB:July 03, 1976, 48 y.o., female Today's Date: 01/07/2024  END OF SESSION:  PT End of Session - 01/07/24 1527     Visit Number 4    Date for PT Re-Evaluation 02/11/24    Authorization Type [employer]    PT Start Time 1530    PT Stop Time 1610    PT Time Calculation (min) 40 min    Activity Tolerance Patient tolerated treatment well             Past Medical History:  Diagnosis Date   Diabetes mellitus without complication (HCC)    Type 2   Family history of brain tumor    Family history of skin cancer    Gestational diabetes    Headache    otc med prn   Heartburn in pregnancy    Incompetent cervix in pregnancy 04/2015   Missed abortion    no surgery required   Personal history of chemotherapy    Personal history of radiation therapy    Postpartum care following cesarean delivery (11/2) 10/12/2015   right breast ca 06/2021   UTI in pregnancy 03/2015   recent UTI, treatment completed   Past Surgical History:  Procedure Laterality Date   APPENDECTOMY  12/10/2004   BREAST BIOPSY Right 2022   BREAST BIOPSY Left 2022   BREAST RECONSTRUCTION WITH PLACEMENT OF TISSUE EXPANDER AND ALLODERM Right 07/25/2021   Procedure: RIGHT BREAST RECONSTRUCTION WITH PLACEMENT OF TISSUE EXPANDER AND ALLODERM;  Surgeon: Glenna Fellows, MD;  Location: [facility];  Service: Plastics;  Laterality: Right;   CERCLAGE LAPAROSCOPIC ABDOMINAL N/A 04/15/2015   Procedure: LAPAROSCOPIC TRANSABDOMINAL CERVCOISTHMIC CERCLAGE;  Surgeon: Fermin Schwab, MD;  Location: [facility];  Service: Gynecology;  Laterality: N/A;   CERVICAL CERCLAGE  02/08/2011   vaginal   CESAREAN SECTION N/A 10/12/2015   Procedure: CESAREAN SECTION;  Surgeon: Genia Del, MD;  Location: [facility];  Service: Obstetrics;  Laterality: N/A;   CESAREAN SECTION N/A 03/03/2018   Procedure: Repeat CESAREAN SECTION/Removal of  Abdominal Cerclage;  Surgeon: Shea Evans, MD;  Location: [facility];  Service: Obstetrics;  Laterality: N/A;  EDD: 03/28/18   MASTECTOMY Right 2022   MASTECTOMY W/ SENTINEL NODE BIOPSY Right 07/25/2021   Procedure: RIGHT MASTECTOMY WITH AXILLARY SENTINEL LYMPH NODE BIOPSY;  Surgeon: Emelia Loron, MD;  Location: [facility];  Service: General;  Laterality: Right;   PORTACATH PLACEMENT N/A 08/24/2021   Procedure: INSERTION PORT-A-CATH;  Surgeon: Emelia Loron, MD;  Location: [facility];  Service: General;  Laterality: N/A;   RADIOACTIVE SEED GUIDED AXILLARY SENTINEL LYMPH NODE Right 07/25/2021   Procedure: RADIOACTIVE SEED GUIDED RIGHT AXILLARY NODE EXCISION;  Surgeon: Emelia Loron, MD;  Location: [facility];  Service: General;  Laterality: Right;   TISSUE EXPANDER PLACEMENT Right 12/04/2021   Procedure: REMOVAL RIGHT CHEST TISSUE EXPANDER;  Surgeon: Glenna Fellows, MD;  Location: [facility];  Service: Plastics;  Laterality: Right;   TISSUE EXPANDER REMOVAL     WISDOM TOOTH EXTRACTION  12/11/1999   Patient Active Problem List   Diagnosis Date Noted   Weakness of right shoulder 08/21/2023   Lower leg mass, left 05/02/2022   Port-A-Cath in place 08/25/2021   S/P mastectomy, right 07/25/2021   Genetic testing 07/01/2021   Family history of skin cancer 06/22/2021   Family history of brain tumor 06/22/2021   Malignant neoplasm of overlapping sites of right breast  in female, estrogen receptor positive (HCC) 06/21/2021   Status post repeat low transverse cesarean section 03/03/2018   DM (diabetes mellitus) in pregnancy 03/02/2018   Incompetent cervix in pregnancy 03/02/2018    PCP: [name] MD  REFERRING PROVIDER: Antoine Primas DO  REFERRING DIAG: W09.811, G89.29 chronic left shoulder pain but pt states her problem is RIGHT shoulder pain  THERAPY DIAG:  right shoulder stiffness; fascial restriction  Rationale for  Evaluation and Treatment: Rehabilitation  ONSET DATE: > 1 year  SUBJECTIVE:                                                                                                                                                                                                         SUBJECTIVE STATEMENT: No soreness from Graston last time.  You can go harder.      EVAL:Had previous cancer rehab and PT at this facility for right shoulder pain. Had MRI in September no rotator cuff tear;  Sees osteopath who was able to relieve pain but has continued decreased right shoulder ROM;  osteopath does DN near spine and pecs but not around scapula or thorax. Tightness approx 6-7 months.  Patient interested in Graston for myofascial release.   Hand dominance: Right Uses pulleys, cat cow, open book, 3 down dog; wall stretching once a day Self Limits weight carries to 2-3 pounds, no overhead lifting PERTINENT HISTORY:  Pt is s/p Right Mastectomy with Tissue Expander and 4+/7 LN on 07/25/21. She is s/p 6 months of chemo followed by radiation. She had emergency surgery to remove tissue expander due to infection on 12/04/2021. She completed radiation on 04/04/22.  right breast reconstruction on 06/24/2023.  7 lymph node removal;  + cording + diabetes 20 years On Ozempic since Sept.   PAIN:   Are you having pain? no NPRS scale: 0/10  PRECAUTIONS: Right lymphedema risk    WEIGHT BEARING RESTRICTIONS: No  FALLS:  Has patient fallen in last 6 months? No  LIVING ENVIRONMENT: Lives with: husband and 2 children Lives in: House/apartment Stairs: Yes; Internal: 14 steps; can reach both and External: 0 steps; none  OCCUPATION: Administrative Asst, works from home   LEISURE: walking, work out with Psychologist, educational,  PLOF: Independent  PATIENT GOALS: improve shoulder mobility; pt is a Horticulturist, commercial  OBJECTIVE:  Note: Objective measures were completed at Evaluation unless otherwise noted.  DIAGNOSTIC FINDINGS:  September  2024 IMPRESSION: 1. Mild supraspinatus tendinopathy. No rotator cuff tear. 2. Mild tendinopathy of the intra-articular segment of the long head of the biceps. 3. Mild degenerative chondral  thinning in the glenohumeral joint. 4. Mild degenerative spurring in the [facility] joint. 5. Trace subacromial subdeltoid bursitis.  PATIENT SURVEYS:  FOTO 51%  COGNITION: Overall cognitive status: Within functional limits for tasks assessed  PALPATION: Moderate to severe scapular hypomobility; decreased lat, teres soft tissue mobility   CERVICAL ROM: decreased cervical rotation by 25% right/left UPPER EXTREMITY ROM:  Active ROM Right eval Left eval 1/21 1/28  Shoulder flexion 110 158 118 Pre 129; post 132  Shoulder extension      Shoulder abduction 108 170 124 pre134; post136  Shoulder adduction      Shoulder extension      Shoulder internal rotation Right iliac crest T3 Right iliac spine Right SIJ  Shoulder external rotation 38; scapular spine right  73/T3 38:scapular spine 43  Elbow flexion      Elbow extension      Wrist flexion      Wrist extension      Wrist ulnar deviation      Wrist radial deviation      Wrist pronation      Wrist supination       (Blank rows = not tested)  Active ROM: Supine 131 flexion  UPPER EXTREMITY MMT: grossly 4/5 in available ROM  TREATMENT DATE:  01/07/24: Shoulder ROM pre and post manual therapy Discussion of current HEP "hard to do every 2 hours" Manual therapy: Graston instrument 4 seated myofascial sweeping and fanning in seated position: upper traps, levator scap, rhomboids, anterior deltoid 45 degree angle; G3 supraspinatus Sidelying: teres, lateral deltoid, triceps 45 degrees angle using G2 Supine: Graston 2  45 degree angle  to pectorals Discussed self monitoring of lymphedema   12/31/23: Manual therapy: Graston instrument 4 seated myofascial sweeping and fanning in seated position: upper traps, levator scap, rhomboids, anterior deltoid 45  degree angle Sidelying: teres, lateral deltoid, triceps 40 degrees angle Supine: Graston 4  30 degree angle (light) to pectorals Child's pose Graston 4 45 degree angle to lats and teres Therapeutic exercise:  Shoulder ROM as above Sidelying sleeper stretch 3x  Review of current HEP and "little bits often" principle with exercise  12/25/23: Manual therapy: Graston instrument #5 seated myofascial sweeping and fanning in seated position: upper traps, levator scap, rhomboids 45 degree angle Supine: Graston 4  10 degree angle (light) to pectorals Child's pose Graston 4 45 degree angle to lats and teres Therapeutic exercise:  seated external rotation with trunk lean on table top 30 sec holds Seated flexion on table top 30 sec holds Standing shoulder flexion table walk aways Doorway shoulder extension with dowel  Dowel shoulder external rotation     PATIENT EDUCATION:  Education details: Educated patient on anatomy and physiology of current symptoms, prognosis, plan of care as well as initial self care strategies to promote recovery Person educated: Patient Education method: Explanation Education comprehension: verbalized understanding  HOME EXERCISE PROGRAM: Discussed principles of increasing ROM: every 2 hours 3-5 min of stretching;  low load but long duration stretching; prioritize supine flexion stretch (pt currently does supine cane with 1# weight)     ASSESSMENT:  CLINICAL IMPRESSION: Much improved shoulder ROM in all planes since start of care and pre and post treatment today.  Increased pressure and intensity using smaller instrument with good initial response.  Therapist monitoring response to all interventions and modifying treatment accordingly. Numbness in axillary area.  Discussed self monitoring of lymphedema and strategies to get under control if signs present.   OBJECTIVE IMPAIRMENTS: decreased activity tolerance,  decreased ROM, increased fascial restrictions, impaired  perceived functional ability, impaired UE functional use, and pain.   ACTIVITY LIMITATIONS: carrying, lifting, reach over head, and hygiene/grooming  PARTICIPATION LIMITATIONS: meal prep, cleaning, and laundry  PERSONAL FACTORS: 1-2 comorbidities: risk factors for adhesive capsulitis including age, gender, diabetes, breast cancer/surgeries  are also affecting patient's functional outcome.   REHAB POTENTIAL: Good  CLINICAL DECISION MAKING: Stable/uncomplicated  EVALUATION COMPLEXITY: Low   GOALS: Goals reviewed with patient? Yes  SHORT TERM GOALS: Target date: 01/28/2024    The patient will demonstrate knowledge of basic self care strategies and exercises to promote healing  Baseline:  Goal status: INITIAL  2.  The patient will have improved shoulder elevation ROM to at least 130 degrees needed for grooming/dressing purposes as well as reaching higher shelves  Baseline:  Goal status: INITIAL  3.  Right shoulder internal rotation improved with reach behind back to L4 needed for dressing,hygiene  Baseline:  Goal status: INITIAL  4.  The patient will have strength needed to carry a 5# object/shopping bag Baseline:  Goal status: INITIAL     LONG TERM GOALS: Target date: 03/10/2024      The patient will be independent in a safe self progression of a home exercise program to promote further recovery of function  Baseline:  Goal status: INITIAL  2.  The patient will have improved shoulder elevation ROM to at least 150 degrees needed for grooming/dressing purposes as well as reaching high shelves  Baseline:  Goal status: INITIAL  3.  Right shoulder external rotation to 50 degrees needed for fixing her hair, dressing tasks Baseline:  Goal status: INITIAL  4.  The patient will have strength needed to lift and lower a 5# object from a high shelf  Baseline:  Goal status: INITIAL  5.  The patient will have improved FOTO score to   67%    indicating improved function with  less pain  Baseline:  Goal status: INITIAL    PLAN:  PT FREQUENCY: 1x/week initially, then decrease to every 2 weeks  PT DURATION: 12 weeks  PLANNED INTERVENTIONS: 97164- PT Re-evaluation, 97110-Therapeutic exercises, 97530- Therapeutic activity, 97112- Neuromuscular re-education, 97535- Self Care, 16109- Manual therapy, [id number]- Aquatic Therapy, 97014- Electrical stimulation (unattended), [id number]- Electrical stimulation (manual), [id number]- Ultrasound, 60454- Ionotophoresis 4mg /ml Dexamethasone, Patient/Family education, Taping, Dry Needling, Joint mobilization, Spinal mobilization, Cryotherapy, and Moist heat  PLAN FOR NEXT SESSION: instrument assisted myofascial release (Graston) seated, sidelying and child's pose;  lat specific stretching; glenohumeral and scapular mobility; long duration holds  Lavinia Sharps, PT 01/07/24 5:18 PM Phone: [phone] Fax: [phone]

## 2024-01-17 ENCOUNTER — Ambulatory Visit: Payer: 59 | Attending: Family Medicine | Admitting: Physical Therapy

## 2024-01-17 DIAGNOSIS — M25611 Stiffness of right shoulder, not elsewhere classified: Secondary | ICD-10-CM | POA: Insufficient documentation

## 2024-01-17 DIAGNOSIS — M7918 Myalgia, other site: Secondary | ICD-10-CM | POA: Insufficient documentation

## 2024-01-17 DIAGNOSIS — M6281 Muscle weakness (generalized): Secondary | ICD-10-CM | POA: Diagnosis present

## 2024-01-17 NOTE — Therapy (Signed)
 OUTPATIENT PHYSICAL THERAPY SHOULDER PROGRESS NOTE   Patient Name: Allison Reed MRN: [id number] DOB:Jan 12, 1976, 48 y.o., female Today's Date: 01/17/2024  END OF SESSION:  PT End of Session - 01/17/24 0849     Visit Number 5    Date for PT Re-Evaluation 02/11/24    Authorization Type [employer]    PT Start Time 0849    PT Stop Time 0928    PT Time Calculation (min) 39 min    Activity Tolerance Patient tolerated treatment well             Past Medical History:  Diagnosis Date   Diabetes mellitus without complication (HCC)    Type 2   Family history of brain tumor    Family history of skin cancer    Gestational diabetes    Headache    otc med prn   Heartburn in pregnancy    Incompetent cervix in pregnancy 04/2015   Missed abortion    no surgery required   Personal history of chemotherapy    Personal history of radiation therapy    Postpartum care following cesarean delivery (11/2) 10/12/2015   right breast ca 06/2021   UTI in pregnancy 03/2015   recent UTI, treatment completed   Past Surgical History:  Procedure Laterality Date   APPENDECTOMY  12/10/2004   BREAST BIOPSY Right 2022   BREAST BIOPSY Left 2022   BREAST RECONSTRUCTION WITH PLACEMENT OF TISSUE EXPANDER AND ALLODERM Right 07/25/2021   Procedure: RIGHT BREAST RECONSTRUCTION WITH PLACEMENT OF TISSUE EXPANDER AND ALLODERM;  Surgeon: Arelia Filippo, MD;  Location: [facility];  Service: Plastics;  Laterality: Right;   CERCLAGE LAPAROSCOPIC ABDOMINAL N/A 04/15/2015   Procedure: LAPAROSCOPIC TRANSABDOMINAL CERVCOISTHMIC CERCLAGE;  Surgeon: Cynthia Loss, MD;  Location: [facility];  Service: Gynecology;  Laterality: N/A;   CERVICAL CERCLAGE  02/08/2011   vaginal   CESAREAN SECTION N/A 10/12/2015   Procedure: CESAREAN SECTION;  Surgeon: Percilla Burly, MD;  Location: [facility];  Service: Obstetrics;  Laterality: N/A;   CESAREAN SECTION N/A 03/03/2018   Procedure: Repeat CESAREAN SECTION/Removal of  Abdominal Cerclage;  Surgeon: Barbette Knock, MD;  Location: [facility];  Service: Obstetrics;  Laterality: N/A;  EDD: 03/28/18   MASTECTOMY Right 2022   MASTECTOMY W/ SENTINEL NODE BIOPSY Right 07/25/2021   Procedure: RIGHT MASTECTOMY WITH AXILLARY SENTINEL LYMPH NODE BIOPSY;  Surgeon: Ebbie Cough, MD;  Location: [facility];  Service: General;  Laterality: Right;   PORTACATH PLACEMENT N/A 08/24/2021   Procedure: INSERTION PORT-A-CATH;  Surgeon: Ebbie Cough, MD;  Location: [facility];  Service: General;  Laterality: N/A;   RADIOACTIVE SEED GUIDED AXILLARY SENTINEL LYMPH NODE Right 07/25/2021   Procedure: RADIOACTIVE SEED GUIDED RIGHT AXILLARY NODE EXCISION;  Surgeon: Ebbie Cough, MD;  Location: [facility];  Service: General;  Laterality: Right;   TISSUE EXPANDER PLACEMENT Right 12/04/2021   Procedure: REMOVAL RIGHT CHEST TISSUE EXPANDER;  Surgeon: Arelia Filippo, MD;  Location: [facility];  Service: Plastics;  Laterality: Right;   TISSUE EXPANDER REMOVAL     WISDOM TOOTH EXTRACTION  12/11/1999   Patient Active Problem List   Diagnosis Date Noted   Weakness of right shoulder 08/21/2023   Lower leg mass, left 05/02/2022   Port-A-Cath in place 08/25/2021   S/P mastectomy, right 07/25/2021   Genetic testing 07/01/2021   Family history of skin cancer 06/22/2021   Family history of brain tumor 06/22/2021   Malignant neoplasm of overlapping sites of right breast  in female, estrogen receptor positive (HCC) 06/21/2021   Status post repeat low transverse cesarean section 03/03/2018   DM (diabetes mellitus) in pregnancy 03/02/2018   Incompetent cervix in pregnancy 03/02/2018    PCP: Odean Potts MD  REFERRING PROVIDER: Claudene Hussar DO  REFERRING DIAG: F74.487, G89.29 chronic left shoulder pain but pt states her problem is RIGHT shoulder pain  THERAPY DIAG:  right shoulder stiffness; fascial restriction  Rationale for  Evaluation and Treatment: Rehabilitation  ONSET DATE: > 1 year  SUBJECTIVE:                                                                                                                                                                                                         SUBJECTIVE STATEMENT: My shoulder is opening up. Demonstrates improved 138 degrees elevation on arrival.  Did fine after last session with no swelling or bruising no issues.  Dance recital in early May.    EVAL:Had previous cancer rehab and PT at this facility for right shoulder pain. Had MRI in September no rotator cuff tear;  Sees osteopath who was able to relieve pain but has continued decreased right shoulder ROM;  osteopath does DN near spine and pecs but not around scapula or thorax. Tightness approx 6-7 months.  Patient interested in Graston for myofascial release.   Hand dominance: Right Uses pulleys, cat cow, open book, 3 down dog; wall stretching once a day Self Limits weight carries to 2-3 pounds, no overhead lifting PERTINENT HISTORY:  Pt is s/p Right Mastectomy with Tissue Expander and 4+/7 LN on 07/25/21. She is s/p 6 months of chemo followed by radiation. She had emergency surgery to remove tissue expander due to infection on 12/04/2021. She completed radiation on 04/04/22.  right breast reconstruction on 06/24/2023.  7 lymph node removal;  + cording + diabetes 20 years On Ozempic since Sept.   PAIN:   Are you having pain? no NPRS scale: 0/10  PRECAUTIONS: Right lymphedema risk    WEIGHT BEARING RESTRICTIONS: No  FALLS:  Has patient fallen in last 6 months? No  LIVING ENVIRONMENT: Lives with: husband and 2 children Lives in: House/apartment Stairs: Yes; Internal: 14 steps; can reach both and External: 0 steps; none  OCCUPATION: Administrative Asst, works from home   LEISURE: walking, work out with psychologist, educational,  PLOF: Independent  PATIENT GOALS: improve shoulder mobility; pt is a  horticulturist, commercial  OBJECTIVE:  Note: Objective measures were completed at Evaluation unless otherwise noted.  DIAGNOSTIC FINDINGS:  September 2024 IMPRESSION: 1. Mild supraspinatus tendinopathy. No rotator cuff tear.  2. Mild tendinopathy of the intra-articular segment of the long head of the biceps. 3. Mild degenerative chondral thinning in the glenohumeral joint. 4. Mild degenerative spurring in the [facility] joint. 5. Trace subacromial subdeltoid bursitis.  PATIENT SURVEYS:  FOTO 51%  COGNITION: Overall cognitive status: Within functional limits for tasks assessed  PALPATION: Moderate to severe scapular hypomobility; decreased lat, teres soft tissue mobility   CERVICAL ROM: decreased cervical rotation by 25% right/left UPPER EXTREMITY ROM:  Active ROM Right eval Left eval 1/21 1/28  Shoulder flexion 110 158 118 Pre 129; post 132  Shoulder extension      Shoulder abduction 108 170 124 pre134; post136  Shoulder adduction      Shoulder extension      Shoulder internal rotation Right iliac crest T3 Right iliac spine Right SIJ  Shoulder external rotation 38; scapular spine right  73/T3 38:scapular spine 43  Elbow flexion      Elbow extension      Wrist flexion      Wrist extension      Wrist ulnar deviation      Wrist radial deviation      Wrist pronation      Wrist supination       (Blank rows = not tested)  Active ROM: Supine 131 flexion  UPPER EXTREMITY MMT: grossly 4/5 in available ROM  TREATMENT DATE:  01/17/24: Shoulder ROM  Therapeutic activity: Discussed upcoming dance recital: dance involves shoulder abducted to 90 degrees and internal rotation; discussed placing elbow on wall and maintaining there for a period of time and also elbow lift offs from that position Manual therapy: Graston instrument 4 seated myofascial sweeping and fanning in seated position: upper traps, levator scap, rhomboids, anterior deltoid 45 degree angle; G3 supraspinatus Sidelying: teres, lateral  deltoid, triceps 45 degrees angle using G2 Supine: Graston 2  45 degree angle  to pectorals Childs pose: teres, lats G4 01/07/24: Shoulder ROM pre and post manual therapy Discussion of current HEP hard to do every 2 hours Manual therapy: Graston instrument 4 seated myofascial sweeping and fanning in seated position: upper traps, levator scap, rhomboids, anterior deltoid 45 degree angle; G3 supraspinatus Sidelying: teres, lateral deltoid, triceps 45 degrees angle using G2 Supine: Graston 2  45 degree angle  to pectorals Discussed self monitoring of lymphedema   12/31/23: Manual therapy: Graston instrument 4 seated myofascial sweeping and fanning in seated position: upper traps, levator scap, rhomboids, anterior deltoid 45 degree angle Sidelying: teres, lateral deltoid, triceps 40 degrees angle Supine: Graston 4  30 degree angle (light) to pectorals Child's pose Graston 4 45 degree angle to lats and teres Therapeutic exercise:  Shoulder ROM as above Sidelying sleeper stretch 3x  Review of current HEP and little bits often principle with exercise  12/25/23: Manual therapy: Graston instrument #5 seated myofascial sweeping and fanning in seated position: upper traps, levator scap, rhomboids 45 degree angle Supine: Graston 4  10 degree angle (light) to pectorals Child's pose Graston 4 45 degree angle to lats and teres Therapeutic exercise:  seated external rotation with trunk lean on table top 30 sec holds Seated flexion on table top 30 sec holds Standing shoulder flexion table walk aways Doorway shoulder extension with dowel  Dowel shoulder external rotation     PATIENT EDUCATION:  Education details: Educated patient on anatomy and physiology of current symptoms, prognosis, plan of care as well as initial self care strategies to promote recovery Person educated: Patient Education method: Explanation Education comprehension: verbalized understanding  HOME EXERCISE  PROGRAM: Discussed principles of increasing ROM: every 2 hours 3-5 min of stretching;  low load but long duration stretching; prioritize supine flexion stretch (pt currently does supine cane with 1# weight)     ASSESSMENT:  CLINICAL IMPRESSION: Improving ROM of the shoulder.  Treatment focus on  myofascial release of shoulder and scapular musculature with improving muscle length noted.  Therapeutic activities including strategies and exercises for upcoming dance recital.  Dance includes sustained shoulder abduction at 90 degrees.  Therapist monitoring response to all interventions and modifying treatment accordingly.     OBJECTIVE IMPAIRMENTS: decreased activity tolerance, decreased ROM, increased fascial restrictions, impaired perceived functional ability, impaired UE functional use, and pain.   ACTIVITY LIMITATIONS: carrying, lifting, reach over head, and hygiene/grooming  PARTICIPATION LIMITATIONS: meal prep, cleaning, and laundry  PERSONAL FACTORS: 1-2 comorbidities: risk factors for adhesive capsulitis including age, gender, diabetes, breast cancer/surgeries  are also affecting patient's functional outcome.   REHAB POTENTIAL: Good  CLINICAL DECISION MAKING: Stable/uncomplicated  EVALUATION COMPLEXITY: Low   GOALS: Goals reviewed with patient? Yes  SHORT TERM GOALS: Target date: 01/28/2024    The patient will demonstrate knowledge of basic self care strategies and exercises to promote healing  Baseline:  Goal status: INITIAL  2.  The patient will have improved shoulder elevation ROM to at least 130 degrees needed for grooming/dressing purposes as well as reaching higher shelves  Baseline:  Goal status: INITIAL  3.  Right shoulder internal rotation improved with reach behind back to L4 needed for dressing,hygiene  Baseline:  Goal status: INITIAL  4.  The patient will have strength needed to carry a 5# object/shopping bag Baseline:  Goal status: INITIAL     LONG  TERM GOALS: Target date: 03/10/2024      The patient will be independent in a safe self progression of a home exercise program to promote further recovery of function  Baseline:  Goal status: INITIAL  2.  The patient will have improved shoulder elevation ROM to at least 150 degrees needed for grooming/dressing purposes as well as reaching high shelves  Baseline:  Goal status: INITIAL  3.  Right shoulder external rotation to 50 degrees needed for fixing her hair, dressing tasks Baseline:  Goal status: INITIAL  4.  The patient will have strength needed to lift and lower a 5# object from a high shelf  Baseline:  Goal status: INITIAL  5.  The patient will have improved FOTO score to   67%    indicating improved function with less pain  Baseline:  Goal status: INITIAL    PLAN:  PT FREQUENCY: 1x/week initially, then decrease to every 2 weeks  PT DURATION: 12 weeks  PLANNED INTERVENTIONS: 97164- PT Re-evaluation, 97110-Therapeutic exercises, 97530- Therapeutic activity, 97112- Neuromuscular re-education, 97535- Self Care, 02859- Manual therapy, [id number]- Aquatic Therapy, 97014- Electrical stimulation (unattended), [id number]- Electrical stimulation (manual), [id number]- Ultrasound, 02966- Ionotophoresis 4mg /ml Dexamethasone , Patient/Family education, Taping, Dry Needling, Joint mobilization, Spinal mobilization, Cryotherapy, and Moist heat  PLAN FOR NEXT SESSION: instrument assisted myofascial release (Graston) seated, sidelying and child's pose;  lat specific stretching; glenohumeral and scapular mobility; long duration holds;  early May dance recital  Glade Pesa, PT 01/17/24 12:07 PM Phone: [phone] Fax: [phone]

## 2024-01-21 ENCOUNTER — Inpatient Hospital Stay: Payer: 59 | Attending: Hematology and Oncology

## 2024-01-21 VITALS — BP 99/74 | HR 97 | Temp 98.4°F | Resp 16

## 2024-01-21 DIAGNOSIS — Z5111 Encounter for antineoplastic chemotherapy: Secondary | ICD-10-CM | POA: Insufficient documentation

## 2024-01-21 DIAGNOSIS — Z170 Estrogen receptor positive status [ER+]: Secondary | ICD-10-CM | POA: Insufficient documentation

## 2024-01-21 DIAGNOSIS — C50811 Malignant neoplasm of overlapping sites of right female breast: Secondary | ICD-10-CM | POA: Insufficient documentation

## 2024-01-21 MED ORDER — GOSERELIN ACETATE 3.6 MG ~~LOC~~ IMPL
3.6000 mg | DRUG_IMPLANT | SUBCUTANEOUS | Status: DC
  Administered 2024-01-21: 3.6 mg via SUBCUTANEOUS
  Filled 2024-01-21: qty 3.6

## 2024-01-22 ENCOUNTER — Ambulatory Visit: Payer: 59 | Admitting: Physical Therapy

## 2024-01-22 DIAGNOSIS — M6281 Muscle weakness (generalized): Secondary | ICD-10-CM | POA: Diagnosis not present

## 2024-01-22 DIAGNOSIS — M7918 Myalgia, other site: Secondary | ICD-10-CM

## 2024-01-22 DIAGNOSIS — M25611 Stiffness of right shoulder, not elsewhere classified: Secondary | ICD-10-CM

## 2024-01-22 NOTE — Therapy (Signed)
OUTPATIENT PHYSICAL THERAPY SHOULDER PROGRESS NOTE   Patient Name: Allison Reed MRN: [id number] DOB:October 04, 1976, 48 y.o., female Today's Date: 01/22/2024  END OF SESSION:  PT End of Session - 01/22/24 0803     Visit Number 6    Date for PT Re-Evaluation 02/11/24    Authorization Type [employer]    PT Start Time 0802    PT Stop Time 0840    PT Time Calculation (min) 38 min    Activity Tolerance Patient tolerated treatment well             Past Medical History:  Diagnosis Date   Diabetes mellitus without complication (HCC)    Type 2   Family history of brain tumor    Family history of skin cancer    Gestational diabetes    Headache    otc med prn   Heartburn in pregnancy    Incompetent cervix in pregnancy 04/2015   Missed abortion    no surgery required   Personal history of chemotherapy    Personal history of radiation therapy    Postpartum care following cesarean delivery (11/2) 10/12/2015   right breast ca 06/2021   UTI in pregnancy 03/2015   recent UTI, treatment completed   Past Surgical History:  Procedure Laterality Date   APPENDECTOMY  12/10/2004   BREAST BIOPSY Right 2022   BREAST BIOPSY Left 2022   BREAST RECONSTRUCTION WITH PLACEMENT OF TISSUE EXPANDER AND ALLODERM Right 07/25/2021   Procedure: RIGHT BREAST RECONSTRUCTION WITH PLACEMENT OF TISSUE EXPANDER AND ALLODERM;  Surgeon: Glenna Fellows, MD;  Location: [facility];  Service: Plastics;  Laterality: Right;   CERCLAGE LAPAROSCOPIC ABDOMINAL N/A 04/15/2015   Procedure: LAPAROSCOPIC TRANSABDOMINAL CERVCOISTHMIC CERCLAGE;  Surgeon: Fermin Schwab, MD;  Location: [facility];  Service: Gynecology;  Laterality: N/A;   CERVICAL CERCLAGE  02/08/2011   vaginal   CESAREAN SECTION N/A 10/12/2015   Procedure: CESAREAN SECTION;  Surgeon: Genia Del, MD;  Location: [facility];  Service: Obstetrics;  Laterality: N/A;   CESAREAN SECTION N/A 03/03/2018   Procedure: Repeat CESAREAN SECTION/Removal of  Abdominal Cerclage;  Surgeon: Shea Evans, MD;  Location: [facility];  Service: Obstetrics;  Laterality: N/A;  EDD: 03/28/18   MASTECTOMY Right 2022   MASTECTOMY W/ SENTINEL NODE BIOPSY Right 07/25/2021   Procedure: RIGHT MASTECTOMY WITH AXILLARY SENTINEL LYMPH NODE BIOPSY;  Surgeon: Emelia Loron, MD;  Location: [facility];  Service: General;  Laterality: Right;   PORTACATH PLACEMENT N/A 08/24/2021   Procedure: INSERTION PORT-A-CATH;  Surgeon: Emelia Loron, MD;  Location: [facility];  Service: General;  Laterality: N/A;   RADIOACTIVE SEED GUIDED AXILLARY SENTINEL LYMPH NODE Right 07/25/2021   Procedure: RADIOACTIVE SEED GUIDED RIGHT AXILLARY NODE EXCISION;  Surgeon: Emelia Loron, MD;  Location: [facility];  Service: General;  Laterality: Right;   TISSUE EXPANDER PLACEMENT Right 12/04/2021   Procedure: REMOVAL RIGHT CHEST TISSUE EXPANDER;  Surgeon: Glenna Fellows, MD;  Location: [facility];  Service: Plastics;  Laterality: Right;   TISSUE EXPANDER REMOVAL     WISDOM TOOTH EXTRACTION  12/11/1999   Patient Active Problem List   Diagnosis Date Noted   Weakness of right shoulder 08/21/2023   Lower leg mass, left 05/02/2022   Port-A-Cath in place 08/25/2021   S/P mastectomy, right 07/25/2021   Genetic testing 07/01/2021   Family history of skin cancer 06/22/2021   Family history of brain tumor 06/22/2021   Malignant neoplasm of overlapping sites of right breast  in female, estrogen receptor positive (HCC) 06/21/2021   Status post repeat low transverse cesarean section 03/03/2018   DM (diabetes mellitus) in pregnancy 03/02/2018   Incompetent cervix in pregnancy 03/02/2018    PCP: [name] MD  REFERRING PROVIDER: Antoine Primas DO  REFERRING DIAG: X91.478, G89.29 chronic left shoulder pain but pt states her problem is RIGHT shoulder pain  THERAPY DIAG:  right shoulder stiffness; fascial restriction  Rationale for  Evaluation and Treatment: Rehabilitation  ONSET DATE: > 1 year  SUBJECTIVE:                                                                                                                                                                                                         SUBJECTIVE STATEMENT: I felt heavy after last time but I had a busy few days after I was here.  Had a massage and drove to [place] also. No swelling.   Dance recital in early May.    EVAL:Had previous cancer rehab and PT at this facility for right shoulder pain. Had MRI in September no rotator cuff tear;  Sees osteopath who was able to relieve pain but has continued decreased right shoulder ROM;  osteopath does DN near spine and pecs but not around scapula or thorax. Tightness approx 6-7 months.  Patient interested in Graston for myofascial release.   Hand dominance: Right Uses pulleys, cat cow, open book, 3 down dog; wall stretching once a day Self Limits weight carries to 2-3 pounds, no overhead lifting PERTINENT HISTORY:  Pt is s/p Right Mastectomy with Tissue Expander and 4+/7 LN on 07/25/21. She is s/p 6 months of chemo followed by radiation. She had emergency surgery to remove tissue expander due to infection on 12/04/2021. She completed radiation on 04/04/22.  right breast reconstruction on 06/24/2023.  7 lymph node removal;  + cording + diabetes 20 years On Ozempic since Sept.   PAIN:   Are you having pain? no NPRS scale: 0/10  PRECAUTIONS: Right lymphedema risk    WEIGHT BEARING RESTRICTIONS: No  FALLS:  Has patient fallen in last 6 months? No  LIVING ENVIRONMENT: Lives with: husband and 2 children Lives in: House/apartment Stairs: Yes; Internal: 14 steps; can reach both and External: 0 steps; none  OCCUPATION: Administrative Asst, works from home   LEISURE: walking, work out with Psychologist, educational,  PLOF: Independent  PATIENT GOALS: improve shoulder mobility; pt is a Horticulturist, commercial  OBJECTIVE:  Note: Objective  measures were completed at Evaluation unless otherwise noted.  DIAGNOSTIC FINDINGS:  September 2024 IMPRESSION: 1. Mild supraspinatus tendinopathy.  No rotator cuff tear. 2. Mild tendinopathy of the intra-articular segment of the long head of the biceps. 3. Mild degenerative chondral thinning in the glenohumeral joint. 4. Mild degenerative spurring in the [facility] joint. 5. Trace subacromial subdeltoid bursitis.  PATIENT SURVEYS:  FOTO 51%  COGNITION: Overall cognitive status: Within functional limits for tasks assessed  PALPATION: Moderate to severe scapular hypomobility; decreased lat, teres soft tissue mobility   CERVICAL ROM: decreased cervical rotation by 25% right/left UPPER EXTREMITY ROM:  Active ROM Right eval Left eval 1/21 1/28 2/12  Shoulder flexion 110 158 118 Pre 129; post 132 135 post  Shoulder extension       Shoulder abduction 108 170 124 pre134; post136   Shoulder adduction       Shoulder extension       Shoulder internal rotation Right iliac crest T3 Right iliac spine Right SIJ   Shoulder external rotation 38; scapular spine right  73/T3 38:scapular spine 43   Elbow flexion       Elbow extension       Wrist flexion       Wrist extension       Wrist ulnar deviation       Wrist radial deviation       Wrist pronation       Wrist supination        (Blank rows = not tested)  Active ROM: Supine 131 flexion  UPPER EXTREMITY MMT: grossly 4/5 in available ROM  TREATMENT DATE:  01/22/24: Pt demo windmill standing ex Discussed place and hold arm at 90 degrees on wall with head turns, wrist ROM Seated thoracic extension with ball at midback 10x (could also use towel roll) Discussed how thoracic extension aids UE elevation Manual therapy: Graston instrument 4 seated myofascial sweeping and fanning in seated position: upper traps, levator scap, rhomboids, anterior deltoid 45 degree angle;Sidelying: teres, lateral deltoid, triceps 45 degrees angle using G2 Supine:  Graston 4  45 degree angle  to pectorals Childs pose: teres, lats G4 01/17/24: Shoulder ROM  Therapeutic activity: Discussed upcoming dance recital: dance involves shoulder abducted to 90 degrees and internal rotation; discussed placing elbow on wall and maintaining there for a period of time and also elbow lift offs from that position Manual therapy: Graston instrument 4 seated myofascial sweeping and fanning in seated position: upper traps, levator scap, rhomboids, anterior deltoid 45 degree angle; G3 supraspinatus Sidelying: teres, lateral deltoid, triceps 45 degrees angle using G2 Supine: Graston 2  45 degree angle  to pectorals Childs pose: teres, lats G4 01/07/24: Shoulder ROM pre and post manual therapy Discussion of current HEP "hard to do every 2 hours" Manual therapy: Graston instrument 4 seated myofascial sweeping and fanning in seated position: upper traps, levator scap, rhomboids, anterior deltoid 45 degree angle; G3 supraspinatus Sidelying: teres, lateral deltoid, triceps 45 degrees angle using G2 Supine: Graston 2  45 degree angle  to pectorals Discussed self monitoring of lymphedema   12/31/23: Manual therapy: Graston instrument 4 seated myofascial sweeping and fanning in seated position: upper traps, levator scap, rhomboids, anterior deltoid 45 degree angle Sidelying: teres, lateral deltoid, triceps 40 degrees angle Supine: Graston 4  30 degree angle (light) to pectorals Child's pose Graston 4 45 degree angle to lats and teres Therapeutic exercise:  Shoulder ROM as above Sidelying sleeper stretch 3x  Review of current HEP and "little bits often" principle with exercise  12/25/23: Manual therapy: Graston instrument #5 seated myofascial sweeping and fanning in seated position: upper  traps, levator scap, rhomboids 45 degree angle Supine: Graston 4  10 degree angle (light) to pectorals Child's pose Graston 4 45 degree angle to lats and teres Therapeutic exercise:  seated  external rotation with trunk lean on table top 30 sec holds Seated flexion on table top 30 sec holds Standing shoulder flexion table walk aways Doorway shoulder extension with dowel  Dowel shoulder external rotation     PATIENT EDUCATION:  Education details: Educated patient on anatomy and physiology of current symptoms, prognosis, plan of care as well as initial self care strategies to promote recovery Person educated: Patient Education method: Explanation Education comprehension: verbalized understanding  HOME EXERCISE PROGRAM: Discussed principles of increasing ROM: every 2 hours 3-5 min of stretching;  low load but long duration stretching; prioritize supine flexion stretch (pt currently does supine cane with 1# weight)     ASSESSMENT:  CLINICAL IMPRESSION: Lightened intensity of myofascial instrument assisted release secondary to symptoms of heaviness following last session but also after massage and a busy weekend including a lot of driving.  Instructed in place and holds on the wall with mobility of the neck and wrist for added soft tissue lengthening.  Therapist monitoring response to all interventions and modifying treatment accordingly.     OBJECTIVE IMPAIRMENTS: decreased activity tolerance, decreased ROM, increased fascial restrictions, impaired perceived functional ability, impaired UE functional use, and pain.   ACTIVITY LIMITATIONS: carrying, lifting, reach over head, and hygiene/grooming  PARTICIPATION LIMITATIONS: meal prep, cleaning, and laundry  PERSONAL FACTORS: 1-2 comorbidities: risk factors for adhesive capsulitis including age, gender, diabetes, breast cancer/surgeries  are also affecting patient's functional outcome.   REHAB POTENTIAL: Good  CLINICAL DECISION MAKING: Stable/uncomplicated  EVALUATION COMPLEXITY: Low   GOALS: Goals reviewed with patient? Yes  SHORT TERM GOALS: Target date: 01/28/2024    The patient will demonstrate knowledge of basic  self care strategies and exercises to promote healing  Baseline:  Goal status:met 2/12 2.  The patient will have improved shoulder elevation ROM to at least 130 degrees needed for grooming/dressing purposes as well as reaching higher shelves  Baseline:  Goal status: met 2/12  3.  Right shoulder internal rotation improved with reach behind back to L4 needed for dressing,hygiene  Baseline:  Goal status: ongoing  4.  The patient will have strength needed to carry a 5# object/shopping bag Baseline:  Goal status: ongoing    LONG TERM GOALS: Target date: 03/10/2024      The patient will be independent in a safe self progression of a home exercise program to promote further recovery of function  Baseline:  Goal status: INITIAL  2.  The patient will have improved shoulder elevation ROM to at least 150 degrees needed for grooming/dressing purposes as well as reaching high shelves  Baseline:  Goal status: INITIAL  3.  Right shoulder external rotation to 50 degrees needed for fixing her hair, dressing tasks Baseline:  Goal status: INITIAL  4.  The patient will have strength needed to lift and lower a 5# object from a high shelf  Baseline:  Goal status: INITIAL  5.  The patient will have improved FOTO score to   67%    indicating improved function with less pain  Baseline:  Goal status: INITIAL    PLAN:  PT FREQUENCY: 1x/week initially, then decrease to every 2 weeks  PT DURATION: 12 weeks  PLANNED INTERVENTIONS: 97164- PT Re-evaluation, 97110-Therapeutic exercises, 97530- Therapeutic activity, [id number]- Neuromuscular re-education, 97535- Self Care, 16109- Manual therapy, [id number]- Aquatic  Therapy, 97014- Electrical stimulation (unattended), [id number]- Electrical stimulation (manual), 16109- Ultrasound, [id number]- Ionotophoresis 4mg /ml Dexamethasone, Patient/Family education, Taping, Dry Needling, Joint mobilization, Spinal mobilization, Cryotherapy, and Moist heat  PLAN FOR NEXT SESSION:  check ROM next visit; check remaining STGS; instrument assisted myofascial release (Graston) seated, sidelying and child's pose;  lat specific stretching; glenohumeral and scapular mobility; long duration holds;  early May dance recital  Lavinia Sharps, PT 01/22/24 8:44 AM Phone: [phone] Fax: [phone]

## 2024-02-04 ENCOUNTER — Encounter: Payer: 59 | Admitting: Physical Therapy

## 2024-02-04 ENCOUNTER — Ambulatory Visit: Payer: 59 | Admitting: Physical Therapy

## 2024-02-04 DIAGNOSIS — M25611 Stiffness of right shoulder, not elsewhere classified: Secondary | ICD-10-CM

## 2024-02-04 DIAGNOSIS — M7918 Myalgia, other site: Secondary | ICD-10-CM

## 2024-02-04 DIAGNOSIS — M6281 Muscle weakness (generalized): Secondary | ICD-10-CM | POA: Diagnosis not present

## 2024-02-04 NOTE — Therapy (Signed)
 OUTPATIENT PHYSICAL THERAPY SHOULDER PROGRESS NOTE   Patient Name: Allison Reed MRN: [id number] DOB:08-07-1976, 48 y.o., female Today's Date: 02/04/2024  END OF SESSION:  PT End of Session - 02/04/24 1627     Visit Number 7    Date for PT Re-Evaluation 02/11/24    Authorization Type [employer]    PT Start Time 1615    PT Stop Time 1645   needs to leave early to pick up kids   PT Time Calculation (min) 30 min    Activity Tolerance Patient tolerated treatment well             Past Medical History:  Diagnosis Date   Diabetes mellitus without complication (HCC)    Type 2   Family history of brain tumor    Family history of skin cancer    Gestational diabetes    Headache    otc med prn   Heartburn in pregnancy    Incompetent cervix in pregnancy 04/2015   Missed abortion    no surgery required   Personal history of chemotherapy    Personal history of radiation therapy    Postpartum care following cesarean delivery (11/2) 10/12/2015   right breast ca 06/2021   UTI in pregnancy 03/2015   recent UTI, treatment completed   Past Surgical History:  Procedure Laterality Date   APPENDECTOMY  12/10/2004   BREAST BIOPSY Right 2022   BREAST BIOPSY Left 2022   BREAST RECONSTRUCTION WITH PLACEMENT OF TISSUE EXPANDER AND ALLODERM Right 07/25/2021   Procedure: RIGHT BREAST RECONSTRUCTION WITH PLACEMENT OF TISSUE EXPANDER AND ALLODERM;  Surgeon: Glenna Fellows, MD;  Location: [facility];  Service: Plastics;  Laterality: Right;   CERCLAGE LAPAROSCOPIC ABDOMINAL N/A 04/15/2015   Procedure: LAPAROSCOPIC TRANSABDOMINAL CERVCOISTHMIC CERCLAGE;  Surgeon: Fermin Schwab, MD;  Location: [facility];  Service: Gynecology;  Laterality: N/A;   CERVICAL CERCLAGE  02/08/2011   vaginal   CESAREAN SECTION N/A 10/12/2015   Procedure: CESAREAN SECTION;  Surgeon: Genia Del, MD;  Location: [facility];  Service: Obstetrics;  Laterality: N/A;   CESAREAN SECTION N/A 03/03/2018    Procedure: Repeat CESAREAN SECTION/Removal of Abdominal Cerclage;  Surgeon: Shea Evans, MD;  Location: [facility];  Service: Obstetrics;  Laterality: N/A;  EDD: 03/28/18   MASTECTOMY Right 2022   MASTECTOMY W/ SENTINEL NODE BIOPSY Right 07/25/2021   Procedure: RIGHT MASTECTOMY WITH AXILLARY SENTINEL LYMPH NODE BIOPSY;  Surgeon: Emelia Loron, MD;  Location: [facility];  Service: General;  Laterality: Right;   PORTACATH PLACEMENT N/A 08/24/2021   Procedure: INSERTION PORT-A-CATH;  Surgeon: Emelia Loron, MD;  Location: [facility];  Service: General;  Laterality: N/A;   RADIOACTIVE SEED GUIDED AXILLARY SENTINEL LYMPH NODE Right 07/25/2021   Procedure: RADIOACTIVE SEED GUIDED RIGHT AXILLARY NODE EXCISION;  Surgeon: Emelia Loron, MD;  Location: [facility];  Service: General;  Laterality: Right;   TISSUE EXPANDER PLACEMENT Right 12/04/2021   Procedure: REMOVAL RIGHT CHEST TISSUE EXPANDER;  Surgeon: Glenna Fellows, MD;  Location: [facility];  Service: Plastics;  Laterality: Right;   TISSUE EXPANDER REMOVAL     WISDOM TOOTH EXTRACTION  12/11/1999   Patient Active Problem List   Diagnosis Date Noted   Weakness of right shoulder 08/21/2023   Lower leg mass, left 05/02/2022   Port-A-Cath in place 08/25/2021   S/P mastectomy, right 07/25/2021   Genetic testing 07/01/2021   Family history of skin cancer 06/22/2021   Family history of brain tumor 06/22/2021  Malignant neoplasm of overlapping sites of right breast in female, estrogen receptor positive (HCC) 06/21/2021   Status post repeat low transverse cesarean section 03/03/2018   DM (diabetes mellitus) in pregnancy 03/02/2018   Incompetent cervix in pregnancy 03/02/2018    PCP: [name] MD  REFERRING PROVIDER: Antoine Primas DO  REFERRING DIAG: Z61.096, G89.29 chronic left shoulder pain but pt states her problem is RIGHT shoulder pain  THERAPY DIAG:  right shoulder  stiffness; fascial restriction  Rationale for Evaluation and Treatment: Rehabilitation  ONSET DATE: > 1 year  SUBJECTIVE:                                                                                                                                                                                                         SUBJECTIVE STATEMENT: My shoulder keeps hiking up (upper trap shrug). Is that normal?  Denies any residual tightness or symptoms after last session.  Dance recital in early May.    EVAL:Had previous cancer rehab and PT at this facility for right shoulder pain. Had MRI in September no rotator cuff tear;  Sees osteopath who was able to relieve pain but has continued decreased right shoulder ROM;  osteopath does DN near spine and pecs but not around scapula or thorax. Tightness approx 6-7 months.  Patient interested in Graston for myofascial release.   Hand dominance: Right Uses pulleys, cat cow, open book, 3 down dog; wall stretching once a day Self Limits weight carries to 2-3 pounds, no overhead lifting PERTINENT HISTORY:  Pt is s/p Right Mastectomy with Tissue Expander and 4+/7 LN on 07/25/21. She is s/p 6 months of chemo followed by radiation. She had emergency surgery to remove tissue expander due to infection on 12/04/2021. She completed radiation on 04/04/22.  right breast reconstruction on 06/24/2023.  7 lymph node removal;  + cording + diabetes 20 years On Ozempic since Sept.   PAIN:   Are you having pain? no NPRS scale: 0/10  PRECAUTIONS: Right lymphedema risk    WEIGHT BEARING RESTRICTIONS: No  FALLS:  Has patient fallen in last 6 months? No  LIVING ENVIRONMENT: Lives with: husband and 2 children Lives in: House/apartment Stairs: Yes; Internal: 14 steps; can reach both and External: 0 steps; none  OCCUPATION: Administrative Asst, works from home   LEISURE: walking, work out with Psychologist, educational,  PLOF: Independent  PATIENT GOALS: improve shoulder mobility;  pt is a Horticulturist, commercial  OBJECTIVE:  Note: Objective measures were completed at Evaluation unless otherwise noted.  DIAGNOSTIC FINDINGS:  September 2024 IMPRESSION: 1. Mild supraspinatus tendinopathy.  No rotator cuff tear. 2. Mild tendinopathy of the intra-articular segment of the long head of the biceps. 3. Mild degenerative chondral thinning in the glenohumeral joint. 4. Mild degenerative spurring in the [facility] joint. 5. Trace subacromial subdeltoid bursitis.  PATIENT SURVEYS:  FOTO 51%  COGNITION: Overall cognitive status: Within functional limits for tasks assessed  PALPATION: Moderate to severe scapular hypomobility; decreased lat, teres soft tissue mobility   CERVICAL ROM: decreased cervical rotation by 25% right/left UPPER EXTREMITY ROM:  Active ROM Right eval Left eval 1/21 1/28 2/12  Shoulder flexion 110 158 118 Pre 129; post 132 135 post  Shoulder extension       Shoulder abduction 108 170 124 pre134; post136   Shoulder adduction       Shoulder extension       Shoulder internal rotation Right iliac crest T3 Right iliac spine Right SIJ   Shoulder external rotation 38; scapular spine right  73/T3 38:scapular spine 43   Elbow flexion       Elbow extension       Wrist flexion       Wrist extension       Wrist ulnar deviation       Wrist radial deviation       Wrist pronation       Wrist supination        (Blank rows = not tested)  Active ROM: Supine 131 flexion  UPPER EXTREMITY MMT: grossly 4/5 in available ROM  TREATMENT DATE: 02/04/24: Discussed compensatory shoulder hike/upper trap overuse with overhead elevation Discussed strategies like place and holds and lift offs in mid ROM to work on avoiding upper trap dominance Manual therapy: Graston instrument 4 seated myofascial sweeping and fanning in seated position: upper traps, levator scap, rhomboids, anterior deltoid 45 degree angle; Sidelying: teres, lateral deltoid, triceps 45 degrees angle using G4 Supine:  Graston 4  45 degree angle  to pectorals Childs pose: teres, lats G4  01/22/24: Pt demo windmill standing ex Discussed place and hold arm at 90 degrees on wall with head turns, wrist ROM Seated thoracic extension with ball at midback 10x (could also use towel roll) Discussed how thoracic extension aids UE elevation Manual therapy: Graston instrument 4 seated myofascial sweeping and fanning in seated position: upper traps, levator scap, rhomboids, anterior deltoid 45 degree angle;Sidelying: teres, lateral deltoid, triceps 45 degrees angle using G2 Supine: Graston 4  45 degree angle  to pectorals Childs pose: teres, lats G4 01/17/24: Shoulder ROM  Therapeutic activity: Discussed upcoming dance recital: dance involves shoulder abducted to 90 degrees and internal rotation; discussed placing elbow on wall and maintaining there for a period of time and also elbow lift offs from that position Manual therapy: Graston instrument 4 seated myofascial sweeping and fanning in seated position: upper traps, levator scap, rhomboids, anterior deltoid 45 degree angle; G3 supraspinatus Sidelying: teres, lateral deltoid, triceps 45 degrees angle using G2 Supine: Graston 2  45 degree angle  to pectorals Childs pose: teres, lats G4 01/07/24: Shoulder ROM pre and post manual therapy Discussion of current HEP "hard to do every 2 hours" Manual therapy: Graston instrument 4 seated myofascial sweeping and fanning in seated position: upper traps, levator scap, rhomboids, anterior deltoid 45 degree angle; G3 supraspinatus Sidelying: teres, lateral deltoid, triceps 45 degrees angle using G2 Supine: Graston 2  45 degree angle  to pectorals Discussed self monitoring of lymphedema      PATIENT EDUCATION:  Education details: Educated patient on anatomy and physiology of  current symptoms, prognosis, plan of care as well as initial self care strategies to promote recovery Person educated: Patient Education method:  Explanation Education comprehension: verbalized understanding  HOME EXERCISE PROGRAM: Discussed principles of increasing ROM: every 2 hours 3-5 min of stretching;  low load but long duration stretching; prioritize supine flexion stretch (pt currently does supine cane with 1# weight)     ASSESSMENT:  CLINICAL IMPRESSION: Improving shoulder ROM and improved fascial lengthening of pectorals.  Verbal and tactile cues for scapular depression to limit compensatory shoulder hike. The patient was encouraged in continuation of HEP post session including soft tissue lengthening and ROM exercises to enhance carryover.    OBJECTIVE IMPAIRMENTS: decreased activity tolerance, decreased ROM, increased fascial restrictions, impaired perceived functional ability, impaired UE functional use, and pain.   ACTIVITY LIMITATIONS: carrying, lifting, reach over head, and hygiene/grooming  PARTICIPATION LIMITATIONS: meal prep, cleaning, and laundry  PERSONAL FACTORS: 1-2 comorbidities: risk factors for adhesive capsulitis including age, gender, diabetes, breast cancer/surgeries  are also affecting patient's functional outcome.   REHAB POTENTIAL: Good  CLINICAL DECISION MAKING: Stable/uncomplicated  EVALUATION COMPLEXITY: Low   GOALS: Goals reviewed with patient? Yes  SHORT TERM GOALS: Target date: 01/28/2024    The patient will demonstrate knowledge of basic self care strategies and exercises to promote healing  Baseline:  Goal status:met 2/12 2.  The patient will have improved shoulder elevation ROM to at least 130 degrees needed for grooming/dressing purposes as well as reaching higher shelves  Baseline:  Goal status: met 2/12  3.  Right shoulder internal rotation improved with reach behind back to L4 needed for dressing,hygiene  Baseline:  Goal status: ongoing  4.  The patient will have strength needed to carry a 5# object/shopping bag Baseline:  Goal status: ongoing    LONG TERM GOALS:  Target date: 03/10/2024      The patient will be independent in a safe self progression of a home exercise program to promote further recovery of function  Baseline:  Goal status: INITIAL  2.  The patient will have improved shoulder elevation ROM to at least 150 degrees needed for grooming/dressing purposes as well as reaching high shelves  Baseline:  Goal status: INITIAL  3.  Right shoulder external rotation to 50 degrees needed for fixing her hair, dressing tasks Baseline:  Goal status: INITIAL  4.  The patient will have strength needed to lift and lower a 5# object from a high shelf  Baseline:  Goal status: INITIAL  5.  The patient will have improved FOTO score to   67%    indicating improved function with less pain  Baseline:  Goal status: INITIAL    PLAN:  PT FREQUENCY: 1x/week initially, then decrease to every 2 weeks  PT DURATION: 12 weeks  PLANNED INTERVENTIONS: 97164- PT Re-evaluation, 97110-Therapeutic exercises, 97530- Therapeutic activity, 97112- Neuromuscular re-education, 97535- Self Care, [id number]- Manual therapy, [id number]- Aquatic Therapy, 97014- Electrical stimulation (unattended), [id number]- Electrical stimulation (manual), [id number]- Ultrasound, 19147- Ionotophoresis 4mg /ml Dexamethasone, Patient/Family education, Taping, Dry Needling, Joint mobilization, Spinal mobilization, Cryotherapy, and Moist heat  PLAN FOR NEXT SESSION: ERO;  instrument assisted myofascial release (Graston) seated, sidelying and child's pose;  lat specific stretching; glenohumeral and scapular mobility; long duration holds;  early May dance recital  Lavinia Sharps, PT 02/04/24 4:50 PM Phone: [phone] Fax: [phone]

## 2024-02-06 ENCOUNTER — Ambulatory Visit: Payer: 59 | Admitting: Physical Therapy

## 2024-02-12 ENCOUNTER — Encounter: Payer: Self-pay | Admitting: Physical Therapy

## 2024-02-14 ENCOUNTER — Telehealth: Payer: Self-pay | Admitting: Hematology and Oncology

## 2024-02-14 NOTE — Telephone Encounter (Signed)
 Rescheduled appointments per patients request via incoming call. Patient is aware of the changes made to her upcoming appointments.

## 2024-02-18 ENCOUNTER — Inpatient Hospital Stay: Payer: 59

## 2024-02-19 ENCOUNTER — Inpatient Hospital Stay: Attending: Hematology and Oncology

## 2024-02-19 VITALS — BP 110/81 | HR 101 | Temp 98.0°F | Resp 18

## 2024-02-19 DIAGNOSIS — C50811 Malignant neoplasm of overlapping sites of right female breast: Secondary | ICD-10-CM | POA: Insufficient documentation

## 2024-02-19 DIAGNOSIS — Z170 Estrogen receptor positive status [ER+]: Secondary | ICD-10-CM | POA: Diagnosis not present

## 2024-02-19 DIAGNOSIS — Z1721 Progesterone receptor positive status: Secondary | ICD-10-CM | POA: Diagnosis not present

## 2024-02-19 DIAGNOSIS — Z5111 Encounter for antineoplastic chemotherapy: Secondary | ICD-10-CM | POA: Diagnosis present

## 2024-02-19 DIAGNOSIS — Z1732 Human epidermal growth factor receptor 2 negative status: Secondary | ICD-10-CM | POA: Insufficient documentation

## 2024-02-19 MED ORDER — GOSERELIN ACETATE 3.6 MG ~~LOC~~ IMPL
3.6000 mg | DRUG_IMPLANT | SUBCUTANEOUS | Status: DC
  Administered 2024-02-19: 3.6 mg via SUBCUTANEOUS
  Filled 2024-02-19: qty 3.6

## 2024-02-20 ENCOUNTER — Ambulatory Visit: Payer: 59 | Attending: Family Medicine | Admitting: Physical Therapy

## 2024-02-20 DIAGNOSIS — M7918 Myalgia, other site: Secondary | ICD-10-CM

## 2024-02-20 DIAGNOSIS — M25611 Stiffness of right shoulder, not elsewhere classified: Secondary | ICD-10-CM | POA: Diagnosis present

## 2024-02-20 DIAGNOSIS — M6281 Muscle weakness (generalized): Secondary | ICD-10-CM

## 2024-02-20 NOTE — Therapy (Signed)
 OUTPATIENT PHYSICAL THERAPY SHOULDER PROGRESS NOTE   Patient Name: Allison Reed MRN: [id number] DOB:12-Sep-1976, 48 y.o., female Today's Date: 02/20/2024  END OF SESSION:  PT End of Session - 02/20/24 1616     Visit Number 8    Date for PT Re-Evaluation 03/10/24    Authorization Type [employer]    PT Start Time 1615    PT Stop Time 1648    PT Time Calculation (min) 33 min    Activity Tolerance Patient tolerated treatment well             Past Medical History:  Diagnosis Date   Diabetes mellitus without complication (HCC)    Type 2   Family history of brain tumor    Family history of skin cancer    Gestational diabetes    Headache    otc med prn   Heartburn in pregnancy    Incompetent cervix in pregnancy 04/2015   Missed abortion    no surgery required   Personal history of chemotherapy    Personal history of radiation therapy    Postpartum care following cesarean delivery (11/2) 10/12/2015   right breast ca 06/2021   UTI in pregnancy 03/2015   recent UTI, treatment completed   Past Surgical History:  Procedure Laterality Date   APPENDECTOMY  12/10/2004   BREAST BIOPSY Right 2022   BREAST BIOPSY Left 2022   BREAST RECONSTRUCTION WITH PLACEMENT OF TISSUE EXPANDER AND ALLODERM Right 07/25/2021   Procedure: RIGHT BREAST RECONSTRUCTION WITH PLACEMENT OF TISSUE EXPANDER AND ALLODERM;  Surgeon: Glenna Fellows, MD;  Location: [facility];  Service: Plastics;  Laterality: Right;   CERCLAGE LAPAROSCOPIC ABDOMINAL N/A 04/15/2015   Procedure: LAPAROSCOPIC TRANSABDOMINAL CERVCOISTHMIC CERCLAGE;  Surgeon: Fermin Schwab, MD;  Location: [facility];  Service: Gynecology;  Laterality: N/A;   CERVICAL CERCLAGE  02/08/2011   vaginal   CESAREAN SECTION N/A 10/12/2015   Procedure: CESAREAN SECTION;  Surgeon: Genia Del, MD;  Location: [facility];  Service: Obstetrics;  Laterality: N/A;   CESAREAN SECTION N/A 03/03/2018   Procedure: Repeat CESAREAN SECTION/Removal of  Abdominal Cerclage;  Surgeon: Shea Evans, MD;  Location: [facility];  Service: Obstetrics;  Laterality: N/A;  EDD: 03/28/18   MASTECTOMY Right 2022   MASTECTOMY W/ SENTINEL NODE BIOPSY Right 07/25/2021   Procedure: RIGHT MASTECTOMY WITH AXILLARY SENTINEL LYMPH NODE BIOPSY;  Surgeon: Emelia Loron, MD;  Location: [facility];  Service: General;  Laterality: Right;   PORTACATH PLACEMENT N/A 08/24/2021   Procedure: INSERTION PORT-A-CATH;  Surgeon: Emelia Loron, MD;  Location: [facility];  Service: General;  Laterality: N/A;   RADIOACTIVE SEED GUIDED AXILLARY SENTINEL LYMPH NODE Right 07/25/2021   Procedure: RADIOACTIVE SEED GUIDED RIGHT AXILLARY NODE EXCISION;  Surgeon: Emelia Loron, MD;  Location: [facility];  Service: General;  Laterality: Right;   TISSUE EXPANDER PLACEMENT Right 12/04/2021   Procedure: REMOVAL RIGHT CHEST TISSUE EXPANDER;  Surgeon: Glenna Fellows, MD;  Location: [facility];  Service: Plastics;  Laterality: Right;   TISSUE EXPANDER REMOVAL     WISDOM TOOTH EXTRACTION  12/11/1999   Patient Active Problem List   Diagnosis Date Noted   Weakness of right shoulder 08/21/2023   Lower leg mass, left 05/02/2022   Port-A-Cath in place 08/25/2021   S/P mastectomy, right 07/25/2021   Genetic testing 07/01/2021   Family history of skin cancer 06/22/2021   Family history of brain tumor 06/22/2021   Malignant neoplasm of overlapping sites of right breast  in female, estrogen receptor positive (HCC) 06/21/2021   Status post repeat low transverse cesarean section 03/03/2018   DM (diabetes mellitus) in pregnancy 03/02/2018   Incompetent cervix in pregnancy 03/02/2018    PCP: [name] MD  REFERRING PROVIDER: Antoine Primas DO  REFERRING DIAG: E45.409, G89.29 chronic left shoulder pain but pt states her problem is RIGHT shoulder pain  THERAPY DIAG:  right shoulder stiffness; fascial restriction  Rationale for  Evaluation and Treatment: Rehabilitation  ONSET DATE: > 1 year  SUBJECTIVE:                                                                                                                                                                                                         SUBJECTIVE STATEMENT: My shoulder shrugs up.  Reconstruction surgery May 12th.  The surgeon says I might feel better after surgery.  The osteopath discharged me and said to continue with Graston and exercise.   Dance recital in early May.    EVAL:Had previous cancer rehab and PT at this facility for right shoulder pain. Had MRI in September no rotator cuff tear;  Sees osteopath who was able to relieve pain but has continued decreased right shoulder ROM;  osteopath does DN near spine and pecs but not around scapula or thorax. Tightness approx 6-7 months.  Patient interested in Graston for myofascial release.   Hand dominance: Right Uses pulleys, cat cow, open book, 3 down dog; wall stretching once a day Self Limits weight carries to 2-3 pounds, no overhead lifting PERTINENT HISTORY:  Pt is s/p Right Mastectomy with Tissue Expander and 4+/7 LN on 07/25/21. She is s/p 6 months of chemo followed by radiation. She had emergency surgery to remove tissue expander due to infection on 12/04/2021. She completed radiation on 04/04/22.  right breast reconstruction on 06/24/2023.  7 lymph node removal;  + cording + diabetes 20 years On Ozempic since Sept.   PAIN:   Are you having pain? no NPRS scale: 0/10  PRECAUTIONS: Right lymphedema risk    WEIGHT BEARING RESTRICTIONS: No  FALLS:  Has patient fallen in last 6 months? No  LIVING ENVIRONMENT: Lives with: husband and 2 children Lives in: House/apartment Stairs: Yes; Internal: 14 steps; can reach both and External: 0 steps; none  OCCUPATION: Administrative Asst, works from home   LEISURE: walking, work out with Psychologist, educational,  PLOF: Independent  PATIENT GOALS: improve  shoulder mobility; pt is a Horticulturist, commercial  OBJECTIVE:  Note: Objective measures were completed at Evaluation unless otherwise noted.  DIAGNOSTIC FINDINGS:  September 2024 IMPRESSION:  1. Mild supraspinatus tendinopathy. No rotator cuff tear. 2. Mild tendinopathy of the intra-articular segment of the long head of the biceps. 3. Mild degenerative chondral thinning in the glenohumeral joint. 4. Mild degenerative spurring in the [facility] joint. 5. Trace subacromial subdeltoid bursitis.  PATIENT SURVEYS:  FOTO 51%  COGNITION: Overall cognitive status: Within functional limits for tasks assessed  PALPATION: Moderate to severe scapular hypomobility; decreased lat, teres soft tissue mobility   CERVICAL ROM: decreased cervical rotation by 25% right/left UPPER EXTREMITY ROM:  Active ROM Right eval Left eval 1/21 1/28 2/12 3/13  Shoulder flexion 110 158 118 Pre 129; post 132 135 post 126  Shoulder extension        Shoulder abduction 108 170 124 pre134; post136  113  Shoulder adduction        Shoulder extension        Shoulder internal rotation Right iliac crest T3 Right iliac spine Right SIJ  L5  Shoulder external rotation 38; scapular spine right  73/T3 38:scapular spine 43  49  Elbow flexion        Elbow extension        Wrist flexion        Wrist extension        Wrist ulnar deviation        Wrist radial deviation        Wrist pronation        Wrist supination         (Blank rows = not tested)  Active ROM: Supine 131 flexion  UPPER EXTREMITY MMT: grossly 4/5 in available ROM  TREATMENT DATE: 02/20/24: Shoulder ROM Review of HEP and response to current treatment Pt demo ulnar nerve stretch (hand circling eye, fingers toward side of face) Manual therapy: Graston instrument 4 seated myofascial sweeping and fanning in seated position: upper traps, levator scap, rhomboids, anterior deltoid 45 degree angle; Sidelying: teres, lateral deltoid, triceps 45 degrees angle using G4 Supine:  Graston 4  45 degree angle  to pectorals Childs pose: teres, lats G4  02/04/24: Discussed compensatory shoulder hike/upper trap overuse with overhead elevation Discussed strategies like place and holds and lift offs in mid ROM to work on avoiding upper trap dominance Manual therapy: Graston instrument 4 seated myofascial sweeping and fanning in seated position: upper traps, levator scap, rhomboids, anterior deltoid 45 degree angle; Sidelying: teres, lateral deltoid, triceps 45 degrees angle using G4 Supine: Graston 4  45 degree angle  to pectorals Childs pose: teres, lats G4  01/22/24: Pt demo windmill standing ex Discussed place and hold arm at 90 degrees on wall with head turns, wrist ROM Seated thoracic extension with ball at midback 10x (could also use towel roll) Discussed how thoracic extension aids UE elevation Manual therapy: Graston instrument 4 seated myofascial sweeping and fanning in seated position: upper traps, levator scap, rhomboids, anterior deltoid 45 degree angle;Sidelying: teres, lateral deltoid, triceps 45 degrees angle using G2 Supine: Graston 4  45 degree angle  to pectorals Childs pose: teres, lats G4    PATIENT EDUCATION:  Education details: Educated patient on anatomy and physiology of current symptoms, prognosis, plan of care as well as initial self care strategies to promote recovery Person educated: Patient Education method: Explanation Education comprehension: verbalized understanding  HOME EXERCISE PROGRAM: Discussed principles of increasing ROM: every 2 hours 3-5 min of stretching;  low load but long duration stretching; prioritize supine flexion stretch (pt currently does supine cane with 1# weight)     ASSESSMENT:  CLINICAL  IMPRESSION: Improved internal and external rotation ROM.  Less compensatory upper trap hike.  Good response with Graston myofascial release with improved tolerance to sustained positioning for pectorals and lats in child's pose  position.  The patient was encouraged in regular performance of HEP post session including soft tissue lengthening of glenohumeral musculature to enhance long term benefit.    OBJECTIVE IMPAIRMENTS: decreased activity tolerance, decreased ROM, increased fascial restrictions, impaired perceived functional ability, impaired UE functional use, and pain.   ACTIVITY LIMITATIONS: carrying, lifting, reach over head, and hygiene/grooming  PARTICIPATION LIMITATIONS: meal prep, cleaning, and laundry  PERSONAL FACTORS: 1-2 comorbidities: risk factors for adhesive capsulitis including age, gender, diabetes, breast cancer/surgeries  are also affecting patient's functional outcome.   REHAB POTENTIAL: Good  CLINICAL DECISION MAKING: Stable/uncomplicated  EVALUATION COMPLEXITY: Low   GOALS: Goals reviewed with patient? Yes  SHORT TERM GOALS: Target date: 01/28/2024    The patient will demonstrate knowledge of basic self care strategies and exercises to promote healing  Baseline:  Goal status:met 2/12 2.  The patient will have improved shoulder elevation ROM to at least 130 degrees needed for grooming/dressing purposes as well as reaching higher shelves  Baseline:  Goal status: met 2/12  3.  Right shoulder internal rotation improved with reach behind back to L4 needed for dressing,hygiene  Baseline:  Goal status: ongoing  4.  The patient will have strength needed to carry a 5# object/shopping bag Baseline:  Goal status: ongoing    LONG TERM GOALS: Target date: 03/10/2024      The patient will be independent in a safe self progression of a home exercise program to promote further recovery of function  Baseline:  Goal status: INITIAL  2.  The patient will have improved shoulder elevation ROM to at least 150 degrees needed for grooming/dressing purposes as well as reaching high shelves  Baseline:  Goal status: INITIAL  3.  Right shoulder external rotation to 50 degrees needed for  fixing her hair, dressing tasks Baseline:  Goal status: INITIAL  4.  The patient will have strength needed to lift and lower a 5# object from a high shelf  Baseline:  Goal status: INITIAL  5.  The patient will have improved FOTO score to   67%    indicating improved function with less pain  Baseline:  Goal status: INITIAL    PLAN:  PT FREQUENCY: 1x/week initially, then decrease to every 2 weeks  PT DURATION: 12 weeks  PLANNED INTERVENTIONS: 97164- PT Re-evaluation, 97110-Therapeutic exercises, 97530- Therapeutic activity, 97112- Neuromuscular re-education, 97535- Self Care, 19147- Manual therapy, [phone]- Aquatic Therapy, 97014- Electrical stimulation (unattended), [phone]- Electrical stimulation (manual), [id number]- Ultrasound, 65784- Ionotophoresis 4mg /ml Dexamethasone, Patient/Family education, Taping, Dry Needling, Joint mobilization, Spinal mobilization, Cryotherapy, and Moist heat  PLAN FOR NEXT SESSION: ERO;  do FOTO; instrument assisted myofascial release (Graston) seated, sidelying and child's pose;  lat specific stretching; glenohumeral and scapular mobility; long duration holds;  early May dance recital; reconstruction surgery 5/12  Lavinia Sharps, PT 02/20/24 4:57 PM Phone: [phone] Fax: [phone]

## 2024-03-05 ENCOUNTER — Ambulatory Visit: Payer: 59 | Admitting: Physical Therapy

## 2024-03-05 DIAGNOSIS — M6281 Muscle weakness (generalized): Secondary | ICD-10-CM | POA: Diagnosis not present

## 2024-03-05 DIAGNOSIS — M7918 Myalgia, other site: Secondary | ICD-10-CM

## 2024-03-05 DIAGNOSIS — M25611 Stiffness of right shoulder, not elsewhere classified: Secondary | ICD-10-CM

## 2024-03-05 NOTE — Therapy (Signed)
 OUTPATIENT PHYSICAL THERAPY SHOULDER PROGRESS NOTE/RECERTIFICATION   Patient Name: Allison Reed MRN: [id number] DOB:1976/03/08, 48 y.o., female Today's Date: 03/05/2024  END OF SESSION:  PT End of Session - 03/05/24 0759     Visit Number 9    Date for PT Re-Evaluation 04/16/24    Authorization Type [employer]    PT Start Time 0800    PT Stop Time 0840    PT Time Calculation (min) 40 min    Activity Tolerance Patient tolerated treatment well             Past Medical History:  Diagnosis Date   Diabetes mellitus without complication (HCC)    Type 2   Family history of brain tumor    Family history of skin cancer    Gestational diabetes    Headache    otc med prn   Heartburn in pregnancy    Incompetent cervix in pregnancy 04/2015   Missed abortion    no surgery required   Personal history of chemotherapy    Personal history of radiation therapy    Postpartum care following cesarean delivery (11/2) 10/12/2015   right breast ca 06/2021   UTI in pregnancy 03/2015   recent UTI, treatment completed   Past Surgical History:  Procedure Laterality Date   APPENDECTOMY  12/10/2004   BREAST BIOPSY Right 2022   BREAST BIOPSY Left 2022   BREAST RECONSTRUCTION WITH PLACEMENT OF TISSUE EXPANDER AND ALLODERM Right 07/25/2021   Procedure: RIGHT BREAST RECONSTRUCTION WITH PLACEMENT OF TISSUE EXPANDER AND ALLODERM;  Surgeon: Glenna Fellows, MD;  Location: [facility];  Service: Plastics;  Laterality: Right;   CERCLAGE LAPAROSCOPIC ABDOMINAL N/A 04/15/2015   Procedure: LAPAROSCOPIC TRANSABDOMINAL CERVCOISTHMIC CERCLAGE;  Surgeon: Fermin Schwab, MD;  Location: [facility];  Service: Gynecology;  Laterality: N/A;   CERVICAL CERCLAGE  02/08/2011   vaginal   CESAREAN SECTION N/A 10/12/2015   Procedure: CESAREAN SECTION;  Surgeon: Genia Del, MD;  Location: [facility];  Service: Obstetrics;  Laterality: N/A;   CESAREAN SECTION N/A 03/03/2018   Procedure: Repeat CESAREAN  SECTION/Removal of Abdominal Cerclage;  Surgeon: Shea Evans, MD;  [facility]: [facility];  Service: Obstetrics;  Laterality: N/A;  EDD: 03/28/18   MASTECTOMY Right 2022   MASTECTOMY W/ SENTINEL NODE BIOPSY Right 07/25/2021   Procedure: RIGHT MASTECTOMY WITH AXILLARY SENTINEL LYMPH NODE BIOPSY;  Surgeon: Emelia Loron, MD;  Location: [facility];  Service: General;  Laterality: Right;   PORTACATH PLACEMENT N/A 08/24/2021   Procedure: INSERTION PORT-A-CATH;  Surgeon: Emelia Loron, MD;  Location: [facility];  Service: General;  Laterality: N/A;   RADIOACTIVE SEED GUIDED AXILLARY SENTINEL LYMPH NODE Right 07/25/2021   Procedure: RADIOACTIVE SEED GUIDED RIGHT AXILLARY NODE EXCISION;  Surgeon: Emelia Loron, MD;  Location: [facility];  Service: General;  Laterality: Right;   TISSUE EXPANDER PLACEMENT Right 12/04/2021   Procedure: REMOVAL RIGHT CHEST TISSUE EXPANDER;  Surgeon: Glenna Fellows, MD;  Location: [facility];  Service: Plastics;  Laterality: Right;   TISSUE EXPANDER REMOVAL     WISDOM TOOTH EXTRACTION  12/11/1999   Patient Active Problem List   Diagnosis Date Noted   Weakness of right shoulder 08/21/2023   Lower leg mass, left 05/02/2022   Port-A-Cath in place 08/25/2021   S/P mastectomy, right 07/25/2021   Genetic testing 07/01/2021   Family history of skin cancer 06/22/2021   Family history of brain tumor 06/22/2021   Malignant neoplasm of overlapping sites of right breast  in female, estrogen receptor positive (HCC) 06/21/2021   Status post repeat low transverse cesarean section 03/03/2018   DM (diabetes mellitus) in pregnancy 03/02/2018   Incompetent cervix in pregnancy 03/02/2018    PCP: [name] MD  REFERRING PROVIDER: Antoine Primas DO  REFERRING DIAG: J81.191, G89.29 chronic left shoulder pain but pt states her problem is RIGHT shoulder pain  THERAPY DIAG:  right shoulder stiffness; fascial  restriction  Rationale for Evaluation and Treatment: Rehabilitation  ONSET DATE: > 1 year  SUBJECTIVE:                                                                                                                                                                                                         SUBJECTIVE STATEMENT: Lots of stress the last 2 weeks, not much exercise    Reconstruction surgery May 12th.    Dance recital in early May.    EVAL:Had previous cancer rehab and PT at this facility for right shoulder pain. Had MRI in September no rotator cuff tear;  Sees osteopath who was able to relieve pain but has continued decreased right shoulder ROM;  osteopath does DN near spine and pecs but not around scapula or thorax. Tightness approx 6-7 months.  Patient interested in Graston for myofascial release.   Hand dominance: Right Uses pulleys, cat cow, open book, 3 down dog; wall stretching once a day Self Limits weight carries to 2-3 pounds, no overhead lifting PERTINENT HISTORY:  Pt is s/p Right Mastectomy with Tissue Expander and 4+/7 LN on 07/25/21. She is s/p 6 months of chemo followed by radiation. She had emergency surgery to remove tissue expander due to infection on 12/04/2021. She completed radiation on 04/04/22.  right breast reconstruction on 06/24/2023.  7 lymph node removal;  + cording + diabetes 20 years On Ozempic since Sept.   PAIN:   Are you having pain? no NPRS scale: 0/10  PRECAUTIONS: Right lymphedema risk    WEIGHT BEARING RESTRICTIONS: No  FALLS:  Has patient fallen in last 6 months? No  LIVING ENVIRONMENT: Lives with: husband and 2 children Lives in: House/apartment Stairs: Yes; Internal: 14 steps; can reach both and External: 0 steps; none  OCCUPATION: Administrative Asst, works from home   LEISURE: walking, work out with Psychologist, educational,  PLOF: Independent  PATIENT GOALS: improve shoulder mobility; pt is a Horticulturist, commercial  OBJECTIVE:  Note: Objective measures  were completed at Evaluation unless otherwise noted.  DIAGNOSTIC FINDINGS:  September 2024 IMPRESSION: 1. Mild supraspinatus tendinopathy. No rotator cuff tear. 2. Mild tendinopathy of the intra-articular  segment of the long head of the biceps. 3. Mild degenerative chondral thinning in the glenohumeral joint. 4. Mild degenerative spurring in the [facility] joint. 5. Trace subacromial subdeltoid bursitis.  PATIENT SURVEYS:  FOTO 51% 3/27: 65%  COGNITION: Overall cognitive status: Within functional limits for tasks assessed  PALPATION: Moderate to severe scapular hypomobility; decreased lat, teres soft tissue mobility   CERVICAL ROM: decreased cervical rotation by 25% right/left UPPER EXTREMITY ROM:  Active ROM Right eval Left eval 1/21 1/28 2/12 3/13 3/27  Shoulder flexion 110 158 118 Pre 129; post 132 135 post 126 122 pre 128 POST  Shoulder extension         Shoulder abduction 108 170 124 pre134; post136  113 113 pre 110 post (feels sore)  Shoulder adduction         Shoulder extension         Shoulder internal rotation Right iliac crest T3 Right iliac spine Right SIJ  L5 L4  Shoulder external rotation 38; scapular spine right  73/T3 38:scapular spine 43  49 47  Elbow flexion         Elbow extension         Wrist flexion         Wrist extension         Wrist ulnar deviation         Wrist radial deviation         Wrist pronation         Wrist supination          (Blank rows = not tested)  Active ROM: Supine 131 flexion  UPPER EXTREMITY MMT: grossly 4/5 in available ROM  TREATMENT DATE: 03/05/24: Shoulder ROM FOTO Doorway static stretching: flexion, pectoral stretch at 90 degrees with cervical rotation, pectoral stretch at 120 degrees, external rotator stretch 30 sec holds Manual therapy: Graston instrument 4 seated myofascial sweeping and fanning in seated position: upper traps, levator scap, rhomboids, anterior deltoid 45 degree angle; Sidelying: teres, lateral deltoid,  triceps 45 degrees angle using G4 Supine: Graston 4  45 degree angle  to pectorals Childs pose: teres, lats G4  02/20/24: Shoulder ROM Review of HEP and response to current treatment Pt demo ulnar nerve stretch (hand circling eye, fingers toward side of face) Manual therapy: Graston instrument 4 seated myofascial sweeping and fanning in seated position: upper traps, levator scap, rhomboids, anterior deltoid 45 degree angle; Sidelying: teres, lateral deltoid, triceps 45 degrees angle using G4 Supine: Graston 4  45 degree angle  to pectorals Childs pose: teres, lats G4  02/04/24: Discussed compensatory shoulder hike/upper trap overuse with overhead elevation Discussed strategies like place and holds and lift offs in mid ROM to work on avoiding upper trap dominance Manual therapy: Graston instrument 4 seated myofascial sweeping and fanning in seated position: upper traps, levator scap, rhomboids, anterior deltoid 45 degree angle; Sidelying: teres, lateral deltoid, triceps 45 degrees angle using G4 Supine: Graston 4  45 degree angle  to pectorals Childs pose: teres, lats G4  01/22/24: Pt demo windmill standing ex Discussed place and hold arm at 90 degrees on wall with head turns, wrist ROM Seated thoracic extension with ball at midback 10x (could also use towel roll) Discussed how thoracic extension aids UE elevation Manual therapy: Graston instrument 4 seated myofascial sweeping and fanning in seated position: upper traps, levator scap, rhomboids, anterior deltoid 45 degree angle;Sidelying: teres, lateral deltoid, triceps 45 degrees angle using G2 Supine: Graston 4  45 degree angle  to pectorals  Marjo Bicker pose: teres, lats G4    PATIENT EDUCATION:  Education details: Educated patient on anatomy and physiology of current symptoms, prognosis, plan of care as well as initial self care strategies to promote recovery Person educated: Patient Education method: Explanation Education comprehension:  verbalized understanding  HOME EXERCISE PROGRAM: Discussed principles of increasing ROM: every 2 hours 3-5 min of stretching;  low load but long duration stretching; prioritize supine flexion stretch (pt currently does supine cane with 1# weight)     ASSESSMENT:  CLINICAL IMPRESSION: No change in the last 2 weeks on ROM secondary to limited home stretching compliance.  Improved ROM in all planes since start of care and improved FOTO functional outcome measure.  Responds well to Graston myofascial manual therapy without signs of lymphedema or pain production.  Recommend continued PT on a biweekly basis for 6 weeks and will discharge prior to upcoming surgery.    OBJECTIVE IMPAIRMENTS: decreased activity tolerance, decreased ROM, increased fascial restrictions, impaired perceived functional ability, impaired UE functional use, and pain.   ACTIVITY LIMITATIONS: carrying, lifting, reach over head, and hygiene/grooming  PARTICIPATION LIMITATIONS: meal prep, cleaning, and laundry  PERSONAL FACTORS: 1-2 comorbidities: risk factors for adhesive capsulitis including age, gender, diabetes, breast cancer/surgeries  are also affecting patient's functional outcome.   REHAB POTENTIAL: Good  CLINICAL DECISION MAKING: Stable/uncomplicated  EVALUATION COMPLEXITY: Low   GOALS: Goals reviewed with patient? Yes  SHORT TERM GOALS: Target date: 01/28/2024    The patient will demonstrate knowledge of basic self care strategies and exercises to promote healing  Baseline:  Goal status:met 2/12 2.  The patient will have improved shoulder elevation ROM to at least 130 degrees needed for grooming/dressing purposes as well as reaching higher shelves  Baseline:  Goal status: met 2/12  3.  Right shoulder internal rotation improved with reach behind back to L4 needed for dressing,hygiene  Baseline:  Goal status: ongoing  4.  The patient will have strength needed to carry a 5# object/shopping  bag Baseline:  Goal status: ongoing    LONG TERM GOALS: Target date: 04/16/2024      The patient will be independent in a safe self progression of a home exercise program to promote further recovery of function  Baseline:  Goal status: ongoing  2.  The patient will have improved shoulder elevation ROM to at least 150 degrees needed for grooming/dressing purposes as well as reaching high shelves  Baseline:  Goal status: ongoing  3.  Right shoulder external rotation to 50 degrees needed for fixing her hair, dressing tasks Baseline:  Goal status: ongoing  4.  The patient will have strength needed to lift and lower a 5# object from a high shelf  Baseline:  Goal status: met 3/27 5.  The patient will have improved FOTO score to   67%    indicating improved function with less pain  Baseline:  Goal status: partially met 65% 3/27    PLAN:  PT FREQUENCY:  every 2 weeks  PT DURATION: 6 weeks  PLANNED INTERVENTIONS: 97164- PT Re-evaluation, 97110-Therapeutic exercises, 97530- Therapeutic activity, 97112- Neuromuscular re-education, 97535- Self Care, 16109- Manual therapy, [id number]- Aquatic Therapy, 97014- Electrical stimulation (unattended), [id number]- Electrical stimulation (manual), [id number]- Ultrasound, 60454- Ionotophoresis 4mg /ml Dexamethasone, Patient/Family education, Taping, Dry Needling, Joint mobilization, Spinal mobilization, Cryotherapy, and Moist heat  PLAN FOR NEXT SESSION:pt to call back to schedule;  instrument assisted myofascial release (Graston) seated, sidelying and child's pose;  lat specific stretching; glenohumeral and scapular mobility; long duration holds;  early May dance recital; reconstruction surgery 5/12  Lavinia Sharps, PT 03/05/24 8:56 AM Phone: [phone] Fax: [phone]

## 2024-03-10 ENCOUNTER — Other Ambulatory Visit: Payer: Self-pay | Admitting: *Deleted

## 2024-03-10 DIAGNOSIS — C50811 Malignant neoplasm of overlapping sites of right female breast: Secondary | ICD-10-CM

## 2024-03-17 ENCOUNTER — Ambulatory Visit: Payer: 59

## 2024-03-17 ENCOUNTER — Telehealth: Payer: Self-pay | Admitting: Hematology and Oncology

## 2024-03-17 NOTE — Telephone Encounter (Signed)
 Confirmed with pt about scheduled date and time

## 2024-03-18 ENCOUNTER — Inpatient Hospital Stay: Attending: Hematology and Oncology

## 2024-03-18 VITALS — BP 108/73 | HR 95 | Temp 97.9°F | Resp 17

## 2024-03-18 DIAGNOSIS — Z170 Estrogen receptor positive status [ER+]: Secondary | ICD-10-CM | POA: Insufficient documentation

## 2024-03-18 DIAGNOSIS — Z5111 Encounter for antineoplastic chemotherapy: Secondary | ICD-10-CM | POA: Diagnosis present

## 2024-03-18 DIAGNOSIS — Z1732 Human epidermal growth factor receptor 2 negative status: Secondary | ICD-10-CM | POA: Diagnosis not present

## 2024-03-18 DIAGNOSIS — Z1721 Progesterone receptor positive status: Secondary | ICD-10-CM | POA: Insufficient documentation

## 2024-03-18 DIAGNOSIS — Z923 Personal history of irradiation: Secondary | ICD-10-CM | POA: Diagnosis not present

## 2024-03-18 DIAGNOSIS — Z79811 Long term (current) use of aromatase inhibitors: Secondary | ICD-10-CM | POA: Diagnosis not present

## 2024-03-18 DIAGNOSIS — C50811 Malignant neoplasm of overlapping sites of right female breast: Secondary | ICD-10-CM | POA: Insufficient documentation

## 2024-03-18 DIAGNOSIS — Z9221 Personal history of antineoplastic chemotherapy: Secondary | ICD-10-CM | POA: Diagnosis not present

## 2024-03-18 MED ORDER — GOSERELIN ACETATE 3.6 MG ~~LOC~~ IMPL
3.6000 mg | DRUG_IMPLANT | Freq: Once | SUBCUTANEOUS | Status: AC
  Administered 2024-03-18: 3.6 mg via SUBCUTANEOUS
  Filled 2024-03-18: qty 3.6

## 2024-03-19 ENCOUNTER — Ambulatory Visit: Attending: Family Medicine | Admitting: Physical Therapy

## 2024-03-19 DIAGNOSIS — M7918 Myalgia, other site: Secondary | ICD-10-CM | POA: Insufficient documentation

## 2024-03-19 DIAGNOSIS — M25611 Stiffness of right shoulder, not elsewhere classified: Secondary | ICD-10-CM | POA: Diagnosis present

## 2024-03-19 DIAGNOSIS — M6281 Muscle weakness (generalized): Secondary | ICD-10-CM | POA: Insufficient documentation

## 2024-03-19 NOTE — Therapy (Signed)
 OUTPATIENT PHYSICAL THERAPY SHOULDER PROGRESS NOTE   Patient Name: Allison Reed MRN: [id number] DOB:January 09, 1976, 48 y.o., female Today's Date: 03/19/2024  END OF SESSION:  PT End of Session - 03/19/24 0801     Visit Number 10    Date for PT Re-Evaluation 04/16/24    Authorization Type [employer]    PT Start Time 0801    PT Stop Time 0840    PT Time Calculation (min) 39 min    Activity Tolerance Patient tolerated treatment well             Past Medical History:  Diagnosis Date   Diabetes mellitus without complication (HCC)    Type 2   Family history of brain tumor    Family history of skin cancer    Gestational diabetes    Headache    otc med prn   Heartburn in pregnancy    Incompetent cervix in pregnancy 04/2015   Missed abortion    no surgery required   Personal history of chemotherapy    Personal history of radiation therapy    Postpartum care following cesarean delivery (11/2) 10/12/2015   right breast ca 06/2021   UTI in pregnancy 03/2015   recent UTI, treatment completed   Past Surgical History:  Procedure Laterality Date   APPENDECTOMY  12/10/2004   BREAST BIOPSY Right 2022   BREAST BIOPSY Left 2022   BREAST RECONSTRUCTION WITH PLACEMENT OF TISSUE EXPANDER AND ALLODERM Right 07/25/2021   Procedure: RIGHT BREAST RECONSTRUCTION WITH PLACEMENT OF TISSUE EXPANDER AND ALLODERM;  Surgeon: Glenna Fellows, MD;  Location: [facility];  Service: Plastics;  Laterality: Right;   CERCLAGE LAPAROSCOPIC ABDOMINAL N/A 04/15/2015   Procedure: LAPAROSCOPIC TRANSABDOMINAL CERVCOISTHMIC CERCLAGE;  Surgeon: Fermin Schwab, MD;  Location: [facility];  Service: Gynecology;  Laterality: N/A;   CERVICAL CERCLAGE  02/08/2011   vaginal   CESAREAN SECTION N/A 10/12/2015   Procedure: CESAREAN SECTION;  Surgeon: Genia Del, MD;  Location: [facility];  Service: Obstetrics;  Laterality: N/A;   CESAREAN SECTION N/A 03/03/2018   Procedure: Repeat CESAREAN SECTION/Removal of  Abdominal Cerclage;  Surgeon: Shea Evans, MD;  Location: [facility];  Service: Obstetrics;  Laterality: N/A;  EDD: 03/28/18   MASTECTOMY Right 2022   MASTECTOMY W/ SENTINEL NODE BIOPSY Right 07/25/2021   Procedure: RIGHT MASTECTOMY WITH AXILLARY SENTINEL LYMPH NODE BIOPSY;  Surgeon: Emelia Loron, MD;  Location: [facility];  Service: General;  Laterality: Right;   PORTACATH PLACEMENT N/A 08/24/2021   Procedure: INSERTION PORT-A-CATH;  Surgeon: Emelia Loron, MD;  Location: [facility];  Service: General;  Laterality: N/A;   RADIOACTIVE SEED GUIDED AXILLARY SENTINEL LYMPH NODE Right 07/25/2021   Procedure: RADIOACTIVE SEED GUIDED RIGHT AXILLARY NODE EXCISION;  Surgeon: Emelia Loron, MD;  Location: [facility];  Service: General;  Laterality: Right;   TISSUE EXPANDER PLACEMENT Right 12/04/2021   Procedure: REMOVAL RIGHT CHEST TISSUE EXPANDER;  Surgeon: Glenna Fellows, MD;  Location: [facility];  Service: Plastics;  Laterality: Right;   TISSUE EXPANDER REMOVAL     WISDOM TOOTH EXTRACTION  12/11/1999   Patient Active Problem List   Diagnosis Date Noted   Weakness of right shoulder 08/21/2023   Lower leg mass, left 05/02/2022   Port-A-Cath in place 08/25/2021   S/P mastectomy, right 07/25/2021   Genetic testing 07/01/2021   Family history of skin cancer 06/22/2021   Family history of brain tumor 06/22/2021   Malignant neoplasm of overlapping sites of right breast  in female, estrogen receptor positive (HCC) 06/21/2021   Status post repeat low transverse cesarean section 03/03/2018   DM (diabetes mellitus) in pregnancy 03/02/2018   Incompetent cervix in pregnancy 03/02/2018    PCP: [name] MD  REFERRING PROVIDER: Antoine Primas DO  REFERRING DIAG: Q03.474, G89.29 chronic left shoulder pain but pt states her problem is RIGHT shoulder pain  THERAPY DIAG:  right shoulder stiffness; fascial restriction  Rationale for  Evaluation and Treatment: Rehabilitation  ONSET DATE: > 1 year  SUBJECTIVE:                                                                                                                                                                                                         SUBJECTIVE STATEMENT: I'm doing much better.  I am sore from really being stretched out.      Reconstruction surgery May 12th.    Dance recital in early May.    EVAL:Had previous cancer rehab and PT at this facility for right shoulder pain. Had MRI in September no rotator cuff tear;  Sees osteopath who was able to relieve pain but has continued decreased right shoulder ROM;  osteopath does DN near spine and pecs but not around scapula or thorax. Tightness approx 6-7 months.  Patient interested in Graston for myofascial release.   Hand dominance: Right Uses pulleys, cat cow, open book, 3 down dog; wall stretching once a day Self Limits weight carries to 2-3 pounds, no overhead lifting PERTINENT HISTORY:  Pt is s/p Right Mastectomy with Tissue Expander and 4+/7 LN on 07/25/21. She is s/p 6 months of chemo followed by radiation. She had emergency surgery to remove tissue expander due to infection on 12/04/2021. She completed radiation on 04/04/22.  right breast reconstruction on 06/24/2023.  7 lymph node removal;  + cording + diabetes 20 years On Ozempic since Sept.   PAIN:   Are you having pain? no NPRS scale: 0/10  PRECAUTIONS: Right lymphedema risk    WEIGHT BEARING RESTRICTIONS: No  FALLS:  Has patient fallen in last 6 months? No  LIVING ENVIRONMENT: Lives with: husband and 2 children Lives in: House/apartment Stairs: Yes; Internal: 14 steps; can reach both and External: 0 steps; none  OCCUPATION: Administrative Asst, works from home   LEISURE: walking, work out with Psychologist, educational,  PLOF: Independent  PATIENT GOALS: improve shoulder mobility; pt is a Horticulturist, commercial  OBJECTIVE:  Note: Objective measures were  completed at Evaluation unless otherwise noted.  DIAGNOSTIC FINDINGS:  September 2024 IMPRESSION: 1. Mild supraspinatus tendinopathy. No rotator cuff tear. 2.  Mild tendinopathy of the intra-articular segment of the long head of the biceps. 3. Mild degenerative chondral thinning in the glenohumeral joint. 4. Mild degenerative spurring in the [facility] joint. 5. Trace subacromial subdeltoid bursitis.  PATIENT SURVEYS:  FOTO 51% 3/27: 65%  COGNITION: Overall cognitive status: Within functional limits for tasks assessed  PALPATION: Moderate to severe scapular hypomobility; decreased lat, teres soft tissue mobility   CERVICAL ROM: decreased cervical rotation by 25% right/left UPPER EXTREMITY ROM:  Active ROM Right eval Left eval 1/21 1/28 2/12 3/13 3/27  Shoulder flexion 110 158 118 Pre 129; post 132 135 post 126 122 pre 128 POST  Shoulder extension         Shoulder abduction 108 170 124 pre134; post136  113 113 pre 110 post (feels sore)  Shoulder adduction         Shoulder extension         Shoulder internal rotation Right iliac crest T3 Right iliac spine Right SIJ  L5 L4  Shoulder external rotation 38; scapular spine right  73/T3 38:scapular spine 43  49 47  Elbow flexion         Elbow extension         Wrist flexion         Wrist extension         Wrist ulnar deviation         Wrist radial deviation         Wrist pronation         Wrist supination          (Blank rows = not tested)  Active ROM: Supine 131 flexion  UPPER EXTREMITY MMT: grossly 4/5 in available ROM  TREATMENT DATE: 03/19/24: Manual therapy: Graston instrument 4 seated myofascial sweeping and fanning in seated position: upper traps, levator scap, rhomboids, anterior deltoid 45 degree angle; Sidelying: teres, lateral deltoid, triceps 45 degrees angle using G4 Supine: Graston 4  45 degree angle  to pectorals Childs pose: teres, lats G4  03/05/24: Shoulder ROM FOTO Doorway static stretching: flexion,  pectoral stretch at 90 degrees with cervical rotation, pectoral stretch at 120 degrees, external rotator stretch 30 sec holds Manual therapy: Graston instrument 4 seated myofascial sweeping and fanning in seated position: upper traps, levator scap, rhomboids, anterior deltoid 45 degree angle; Sidelying: teres, lateral deltoid, triceps 45 degrees angle using G4 Supine: Graston 4  45 degree angle  to pectorals Childs pose: teres, lats G4  02/20/24: Shoulder ROM Review of HEP and response to current treatment Pt demo ulnar nerve stretch (hand circling eye, fingers toward side of face) Manual therapy: Graston instrument 4 seated myofascial sweeping and fanning in seated position: upper traps, levator scap, rhomboids, anterior deltoid 45 degree angle; Sidelying: teres, lateral deltoid, triceps 45 degrees angle using G4 Supine: Graston 4  45 degree angle  to pectorals Childs pose: teres, lats G4  02/04/24: Discussed compensatory shoulder hike/upper trap overuse with overhead elevation Discussed strategies like place and holds and lift offs in mid ROM to work on avoiding upper trap dominance Manual therapy: Graston instrument 4 seated myofascial sweeping and fanning in seated position: upper traps, levator scap, rhomboids, anterior deltoid 45 degree angle; Sidelying: teres, lateral deltoid, triceps 45 degrees angle using G4 Supine: Graston 4  45 degree angle  to pectorals Childs pose: teres, lats G4  01/22/24: Pt demo windmill standing ex Discussed place and hold arm at 90 degrees on wall with head turns, wrist ROM Seated thoracic extension with ball at midback  10x (could also use towel roll) Discussed how thoracic extension aids UE elevation Manual therapy: Graston instrument 4 seated myofascial sweeping and fanning in seated position: upper traps, levator scap, rhomboids, anterior deltoid 45 degree angle;Sidelying: teres, lateral deltoid, triceps 45 degrees angle using G2 Supine: Graston 4  45  degree angle  to pectorals Childs pose: teres, lats G4    PATIENT EDUCATION:  Education details: Educated patient on anatomy and physiology of current symptoms, prognosis, plan of care as well as initial self care strategies to promote recovery Person educated: Patient Education method: Explanation Education comprehension: verbalized understanding  HOME EXERCISE PROGRAM: Discussed principles of increasing ROM: every 2 hours 3-5 min of stretching;  low load but long duration stretching; prioritize supine flexion stretch (pt currently does supine cane with 1# weight)     ASSESSMENT:  CLINICAL IMPRESSION: Good response to Graston technique to address myofascial restrictions affecting ability to fully elevate or abduct her right UE.  Both anterior and posterior musculature treated.  Encouraged regular performance of ex to continue with lengthening of tissue.  Follow up in 2 weeks.   OBJECTIVE IMPAIRMENTS: decreased activity tolerance, decreased ROM, increased fascial restrictions, impaired perceived functional ability, impaired UE functional use, and pain.   ACTIVITY LIMITATIONS: carrying, lifting, reach over head, and hygiene/grooming  PARTICIPATION LIMITATIONS: meal prep, cleaning, and laundry  PERSONAL FACTORS: 1-2 comorbidities: risk factors for adhesive capsulitis including age, gender, diabetes, breast cancer/surgeries  are also affecting patient's functional outcome.   REHAB POTENTIAL: Good  CLINICAL DECISION MAKING: Stable/uncomplicated  EVALUATION COMPLEXITY: Low   GOALS: Goals reviewed with patient? Yes  SHORT TERM GOALS: Target date: 01/28/2024    The patient will demonstrate knowledge of basic self care strategies and exercises to promote healing  Baseline:  Goal status:met 2/12 2.  The patient will have improved shoulder elevation ROM to at least 130 degrees needed for grooming/dressing purposes as well as reaching higher shelves  Baseline:  Goal status: met  2/12  3.  Right shoulder internal rotation improved with reach behind back to L4 needed for dressing,hygiene  Baseline:  Goal status: ongoing  4.  The patient will have strength needed to carry a 5# object/shopping bag Baseline:  Goal status: ongoing    LONG TERM GOALS: Target date: 04/16/2024      The patient will be independent in a safe self progression of a home exercise program to promote further recovery of function  Baseline:  Goal status: ongoing  2.  The patient will have improved shoulder elevation ROM to at least 150 degrees needed for grooming/dressing purposes as well as reaching high shelves  Baseline:  Goal status: ongoing  3.  Right shoulder external rotation to 50 degrees needed for fixing her hair, dressing tasks Baseline:  Goal status: ongoing  4.  The patient will have strength needed to lift and lower a 5# object from a high shelf  Baseline:  Goal status: met 3/27 5.  The patient will have improved FOTO score to   67%    indicating improved function with less pain  Baseline:  Goal status: partially met 65% 3/27    PLAN:  PT FREQUENCY:  every 2 weeks  PT DURATION: 6 weeks  PLANNED INTERVENTIONS: 97164- PT Re-evaluation, 97110-Therapeutic exercises, 97530- Therapeutic activity, 97112- Neuromuscular re-education, 97535- Self Care, [id number]- Manual therapy, [id number]- Aquatic Therapy, 97014- Electrical stimulation (unattended), [id number]- Electrical stimulation (manual), [id number]- Ultrasound, 78469- Ionotophoresis 4mg /ml Dexamethasone, Patient/Family education, Taping, Dry Needling, Joint mobilization, Spinal mobilization, Cryotherapy, and Moist  heat  PLAN FOR NEXT SESSION: instrument assisted myofascial release (Graston) seated, sidelying and child's pose;  lat specific stretching; glenohumeral and scapular mobility; long duration holds;  early May dance recital; reconstruction surgery 5/12  Lavinia Sharps, PT 03/19/24 8:34 AM Phone: [phone] Fax:  [phone]

## 2024-04-02 ENCOUNTER — Ambulatory Visit: Admitting: Physical Therapy

## 2024-04-02 DIAGNOSIS — M7918 Myalgia, other site: Secondary | ICD-10-CM

## 2024-04-02 DIAGNOSIS — M6281 Muscle weakness (generalized): Secondary | ICD-10-CM

## 2024-04-02 DIAGNOSIS — M25611 Stiffness of right shoulder, not elsewhere classified: Secondary | ICD-10-CM

## 2024-04-02 NOTE — Therapy (Signed)
 OUTPATIENT PHYSICAL THERAPY SHOULDER PROGRESS NOTE   Patient Name: Allison Reed MRN: [id number] DOB:1976-09-27, 48 y.o., female Today's Date: 04/02/2024  END OF SESSION:  PT End of Session - 04/02/24 0811     Visit Number 11    Date for PT Re-Evaluation 04/16/24    Authorization Type [employer]    PT Start Time 0810   late   PT Stop Time 0845    PT Time Calculation (min) 35 min    Activity Tolerance Patient tolerated treatment well             Past Medical History:  Diagnosis Date   Diabetes mellitus without complication (HCC)    Type 2   Family history of brain tumor    Family history of skin cancer    Gestational diabetes    Headache    otc med prn   Heartburn in pregnancy    Incompetent cervix in pregnancy 04/2015   Missed abortion    no surgery required   Personal history of chemotherapy    Personal history of radiation therapy    Postpartum care following cesarean delivery (11/2) 10/12/2015   right breast ca 06/2021   UTI in pregnancy 03/2015   recent UTI, treatment completed   Past Surgical History:  Procedure Laterality Date   APPENDECTOMY  12/10/2004   BREAST BIOPSY Right 2022   BREAST BIOPSY Left 2022   BREAST RECONSTRUCTION WITH PLACEMENT OF TISSUE EXPANDER AND ALLODERM Right 07/25/2021   Procedure: RIGHT BREAST RECONSTRUCTION WITH PLACEMENT OF TISSUE EXPANDER AND ALLODERM;  Surgeon: Alger Infield, MD;  Location: [facility];  Service: Plastics;  Laterality: Right;   CERCLAGE LAPAROSCOPIC ABDOMINAL N/A 04/15/2015   Procedure: LAPAROSCOPIC TRANSABDOMINAL CERVCOISTHMIC CERCLAGE;  Surgeon: Asa Bjork, MD;  Location: [facility];  Service: Gynecology;  Laterality: N/A;   CERVICAL CERCLAGE  02/08/2011   vaginal   CESAREAN SECTION N/A 10/12/2015   Procedure: CESAREAN SECTION;  Surgeon: Percy Bracken, MD;  Location: [facility];  Service: Obstetrics;  Laterality: N/A;   CESAREAN SECTION N/A 03/03/2018   Procedure: Repeat CESAREAN  SECTION/Removal of Abdominal Cerclage;  Surgeon: Terri Fester, MD;  Location: [facility];  Service: Obstetrics;  Laterality: N/A;  EDD: 03/28/18   MASTECTOMY Right 2022   MASTECTOMY W/ SENTINEL NODE BIOPSY Right 07/25/2021   Procedure: RIGHT MASTECTOMY WITH AXILLARY SENTINEL LYMPH NODE BIOPSY;  Surgeon: Enid Harry, MD;  Location: [facility];  Service: General;  Laterality: Right;   PORTACATH PLACEMENT N/A 08/24/2021   Procedure: INSERTION PORT-A-CATH;  Surgeon: Enid Harry, MD;  Location: [facility];  Service: General;  Laterality: N/A;   RADIOACTIVE SEED GUIDED AXILLARY SENTINEL LYMPH NODE Right 07/25/2021   Procedure: RADIOACTIVE SEED GUIDED RIGHT AXILLARY NODE EXCISION;  Surgeon: Enid Harry, MD;  Location: [facility];  Service: General;  Laterality: Right;   TISSUE EXPANDER PLACEMENT Right 12/04/2021   Procedure: REMOVAL RIGHT CHEST TISSUE EXPANDER;  Surgeon: Alger Infield, MD;  Location: [facility];  Service: Plastics;  Laterality: Right;   TISSUE EXPANDER REMOVAL     WISDOM TOOTH EXTRACTION  12/11/1999   Patient Active Problem List   Diagnosis Date Noted   Weakness of right shoulder 08/21/2023   Lower leg mass, left 05/02/2022   Port-A-Cath in place 08/25/2021   S/P mastectomy, right 07/25/2021   Genetic testing 07/01/2021   Family history of skin cancer 06/22/2021   Family history of brain tumor 06/22/2021   Malignant neoplasm of overlapping sites of  right breast in female, estrogen receptor positive (HCC) 06/21/2021   Status post repeat low transverse cesarean section 03/03/2018   DM (diabetes mellitus) in pregnancy 03/02/2018   Incompetent cervix in pregnancy 03/02/2018    PCP: Cameron Cea MD  REFERRING PROVIDER: Ronnell Coins DO  REFERRING DIAG: V40.981, G89.29 chronic left shoulder pain but pt states her problem is RIGHT shoulder pain  THERAPY DIAG:  right shoulder stiffness; fascial  restriction  Rationale for Evaluation and Treatment: Rehabilitation  ONSET DATE: > 1 year  SUBJECTIVE:                                                                                                                                                                                                         SUBJECTIVE STATEMENT: I was worried I would stiffen up but no.  No pain.  Wore compression garment on the plane.  Did fine after last session.  Practicing for upcoming dance recital.     Reconstruction surgery May 12th.    Dance recital in early May.    EVAL:Had previous cancer rehab and PT at this facility for right shoulder pain. Had MRI in September no rotator cuff tear;  Sees osteopath who was able to relieve pain but has continued decreased right shoulder ROM;  osteopath does DN near spine and pecs but not around scapula or thorax. Tightness approx 6-7 months.  Patient interested in Graston for myofascial release.   Hand dominance: Right Uses pulleys, cat cow, open book, 3 down dog; wall stretching once a day Self Limits weight carries to 2-3 pounds, no overhead lifting PERTINENT HISTORY:  Pt is s/p Right Mastectomy with Tissue Expander and 4+/7 LN on 07/25/21. She is s/p 6 months of chemo followed by radiation. She had emergency surgery to remove tissue expander due to infection on 12/04/2021. She completed radiation on 04/04/22.  right breast reconstruction on 06/24/2023.  7 lymph node removal;  + cording + diabetes 20 years On Ozempic since Sept.   PAIN:   Are you having pain? no NPRS scale: 0/10  PRECAUTIONS: Right lymphedema risk    WEIGHT BEARING RESTRICTIONS: No  FALLS:  Has patient fallen in last 6 months? No  LIVING ENVIRONMENT: Lives with: husband and 2 children Lives in: House/apartment Stairs: Yes; Internal: 14 steps; can reach both and External: 0 steps; none  OCCUPATION: Administrative Asst, works from home   LEISURE: walking, work out with Psychologist, educational,  PLOF:  Independent  PATIENT GOALS: improve shoulder mobility; pt is a Horticulturist, commercial  OBJECTIVE:  Note: Objective measures were completed at Evaluation  unless otherwise noted.  DIAGNOSTIC FINDINGS:  September 2024 IMPRESSION: 1. Mild supraspinatus tendinopathy. No rotator cuff tear. 2. Mild tendinopathy of the intra-articular segment of the long head of the biceps. 3. Mild degenerative chondral thinning in the glenohumeral joint. 4. Mild degenerative spurring in the [facility] joint. 5. Trace subacromial subdeltoid bursitis.  PATIENT SURVEYS:  FOTO 51% 3/27: 65%  COGNITION: Overall cognitive status: Within functional limits for tasks assessed  PALPATION: Moderate to severe scapular hypomobility; decreased lat, teres soft tissue mobility   CERVICAL ROM: decreased cervical rotation by 25% right/left UPPER EXTREMITY ROM:  Active ROM Right eval Left eval 1/21 1/28 2/12 3/13 3/27  Shoulder flexion 110 158 118 Pre 129; post 132 135 post 126 122 pre 128 POST  Shoulder extension         Shoulder abduction 108 170 124 pre134; post136  113 113 pre 110 post (feels sore)  Shoulder adduction         Shoulder extension         Shoulder internal rotation Right iliac crest T3 Right iliac spine Right SIJ  L5 L4  Shoulder external rotation 38; scapular spine right  73/T3 38:scapular spine 43  49 47  Elbow flexion         Elbow extension         Wrist flexion         Wrist extension         Wrist ulnar deviation         Wrist radial deviation         Wrist pronation         Wrist supination          (Blank rows = not tested)  Active ROM: Supine 131 flexion  UPPER EXTREMITY MMT: grossly 4/5 in available ROM  TREATMENT DATE: 04/02/24: Review of current status and activity level Manual therapy: Graston instrument 4 seated myofascial sweeping and fanning in seated position: upper traps, levator scap, rhomboids, anterior deltoid 45 degree angle; Sidelying: teres, lateral deltoid, triceps 45 degrees angle  using G4 Supine: Graston 4  45 degree angle  to pectorals Childs pose: teres, lats G4  03/19/24: Manual therapy: Graston instrument 4 seated myofascial sweeping and fanning in seated position: upper traps, levator scap, rhomboids, anterior deltoid 45 degree angle; Sidelying: teres, lateral deltoid, triceps 45 degrees angle using G4 Supine: Graston 4  45 degree angle  to pectorals Childs pose: teres, lats G4  03/05/24: Shoulder ROM FOTO Doorway static stretching: flexion, pectoral stretch at 90 degrees with cervical rotation, pectoral stretch at 120 degrees, external rotator stretch 30 sec holds Manual therapy: Graston instrument 4 seated myofascial sweeping and fanning in seated position: upper traps, levator scap, rhomboids, anterior deltoid 45 degree angle; Sidelying: teres, lateral deltoid, triceps 45 degrees angle using G4 Supine: Graston 4  45 degree angle  to pectorals Childs pose: teres, lats G4  02/20/24: Shoulder ROM Review of HEP and response to current treatment Pt demo ulnar nerve stretch (hand circling eye, fingers toward side of face) Manual therapy: Graston instrument 4 seated myofascial sweeping and fanning in seated position: upper traps, levator scap, rhomboids, anterior deltoid 45 degree angle; Sidelying: teres, lateral deltoid, triceps 45 degrees angle using G4 Supine: Graston 4  45 degree angle  to pectorals Childs pose: teres, lats G4  02/04/24: Discussed compensatory shoulder hike/upper trap overuse with overhead elevation Discussed strategies like place and holds and lift offs in mid ROM to work on avoiding upper trap dominance Manual  therapy: Graston instrument 4 seated myofascial sweeping and fanning in seated position: upper traps, levator scap, rhomboids, anterior deltoid 45 degree angle; Sidelying: teres, lateral deltoid, triceps 45 degrees angle using G4 Supine: Graston 4  45 degree angle  to pectorals Childs pose: teres, lats G4    PATIENT EDUCATION:   Education details: Educated patient on anatomy and physiology of current symptoms, prognosis, plan of care as well as initial self care strategies to promote recovery Person educated: Patient Education method: Explanation Education comprehension: verbalized understanding  HOME EXERCISE PROGRAM: Discussed principles of increasing ROM: every 2 hours 3-5 min of stretching;  low load but long duration stretching; prioritize supine flexion stretch (pt currently does supine cane with 1# weight)     ASSESSMENT:  CLINICAL IMPRESSION: Decreasing myofascial restrictions and improving muscle lengths (pectoral in particular).  Graston myofascial release performed on glenohumeral and scapular musculature with a slight increase in intensity.  Continue to encourage static stretching/place and hold exercises to prolong the benefit of treatment interventions.  Some compensatory shoulder shrug with shoulder elevation.  Will plan for discharge next visit as she has an upcoming surgery.    OBJECTIVE IMPAIRMENTS: decreased activity tolerance, decreased ROM, increased fascial restrictions, impaired perceived functional ability, impaired UE functional use, and pain.   ACTIVITY LIMITATIONS: carrying, lifting, reach over head, and hygiene/grooming  PARTICIPATION LIMITATIONS: meal prep, cleaning, and laundry  PERSONAL FACTORS: 1-2 comorbidities: risk factors for adhesive capsulitis including age, gender, diabetes, breast cancer/surgeries  are also affecting patient's functional outcome.   REHAB POTENTIAL: Good  CLINICAL DECISION MAKING: Stable/uncomplicated  EVALUATION COMPLEXITY: Low   GOALS: Goals reviewed with patient? Yes  SHORT TERM GOALS: Target date: 01/28/2024    The patient will demonstrate knowledge of basic self care strategies and exercises to promote healing  Baseline:  Goal status:met 2/12 2.  The patient will have improved shoulder elevation ROM to at least 130 degrees needed for  grooming/dressing purposes as well as reaching higher shelves  Baseline:  Goal status: met 2/12  3.  Right shoulder internal rotation improved with reach behind back to L4 needed for dressing,hygiene  Baseline:  Goal status: ongoing  4.  The patient will have strength needed to carry a 5# object/shopping bag Baseline:  Goal status: ongoing    LONG TERM GOALS: Target date: 04/16/2024      The patient will be independent in a safe self progression of a home exercise program to promote further recovery of function  Baseline:  Goal status: ongoing  2.  The patient will have improved shoulder elevation ROM to at least 150 degrees needed for grooming/dressing purposes as well as reaching high shelves  Baseline:  Goal status: ongoing  3.  Right shoulder external rotation to 50 degrees needed for fixing her hair, dressing tasks Baseline:  Goal status: ongoing  4.  The patient will have strength needed to lift and lower a 5# object from a high shelf  Baseline:  Goal status: met 3/27 5.  The patient will have improved FOTO score to   67%    indicating improved function with less pain  Baseline:  Goal status: partially met 65% 3/27    PLAN:  PT FREQUENCY:  every 2 weeks  PT DURATION: 6 weeks  PLANNED INTERVENTIONS: 97164- PT Re-evaluation, 97110-Therapeutic exercises, 97530- Therapeutic activity, 97112- Neuromuscular re-education, 97535- Self Care, 16109- Manual therapy, [id number]- Aquatic Therapy, 97014- Electrical stimulation (unattended), [id number]- Electrical stimulation (manual), [id number]- Ultrasound, 60454- Ionotophoresis 4mg /ml Dexamethasone , Patient/Family education, Taping, Dry Needling,  Joint mobilization, Spinal mobilization, Cryotherapy, and Moist heat  PLAN FOR NEXT SESSION:see how dance recital went;  check progress towards goals; ROM; discharge next visit prior to upcoming surgery;  instrument assisted myofascial release (Graston) seated, sidelying and child's pose  Darien Eden, PT 04/02/24 8:49 AM Phone: [phone] Fax: [phone]

## 2024-04-07 ENCOUNTER — Encounter: Payer: Self-pay | Admitting: Hematology and Oncology

## 2024-04-08 ENCOUNTER — Inpatient Hospital Stay

## 2024-04-08 DIAGNOSIS — Z5111 Encounter for antineoplastic chemotherapy: Secondary | ICD-10-CM | POA: Diagnosis not present

## 2024-04-08 DIAGNOSIS — C50811 Malignant neoplasm of overlapping sites of right female breast: Secondary | ICD-10-CM

## 2024-04-08 LAB — CBC WITH DIFFERENTIAL (CANCER CENTER ONLY)
Abs Immature Granulocytes: 0 10*3/uL (ref 0.00–0.07)
Basophils Absolute: 0 10*3/uL (ref 0.0–0.1)
Basophils Relative: 1 %
Eosinophils Absolute: 0.2 10*3/uL (ref 0.0–0.5)
Eosinophils Relative: 4 %
HCT: 42.3 % (ref 36.0–46.0)
Hemoglobin: 14.3 g/dL (ref 12.0–15.0)
Immature Granulocytes: 0 %
Lymphocytes Relative: 41 %
Lymphs Abs: 1.7 10*3/uL (ref 0.7–4.0)
MCH: 26.3 pg (ref 26.0–34.0)
MCHC: 33.8 g/dL (ref 30.0–36.0)
MCV: 77.8 fL — ABNORMAL LOW (ref 80.0–100.0)
Monocytes Absolute: 0.3 10*3/uL (ref 0.1–1.0)
Monocytes Relative: 8 %
Neutro Abs: 1.9 10*3/uL (ref 1.7–7.7)
Neutrophils Relative %: 46 %
Platelet Count: 214 10*3/uL (ref 150–400)
RBC: 5.44 MIL/uL — ABNORMAL HIGH (ref 3.87–5.11)
RDW: 13.5 % (ref 11.5–15.5)
WBC Count: 4 10*3/uL (ref 4.0–10.5)
nRBC: 0 % (ref 0.0–0.2)

## 2024-04-08 LAB — CMP (CANCER CENTER ONLY)
ALT: 21 U/L (ref 0–44)
AST: 20 U/L (ref 15–41)
Albumin: 4.5 g/dL (ref 3.5–5.0)
Alkaline Phosphatase: 56 U/L (ref 38–126)
Anion gap: 7 (ref 5–15)
BUN: 9 mg/dL (ref 6–20)
CO2: 29 mmol/L (ref 22–32)
Calcium: 9.5 mg/dL (ref 8.9–10.3)
Chloride: 102 mmol/L (ref 98–111)
Creatinine: 0.6 mg/dL (ref 0.44–1.00)
GFR, Estimated: >60 mL/min (ref >60)
Glucose, Bld: 124 mg/dL — ABNORMAL HIGH (ref 70–99)
Potassium: 3.9 mmol/L (ref 3.5–5.1)
Sodium: 138 mmol/L (ref 135–145)
Total Bilirubin: 0.7 mg/dL (ref 0.0–1.2)
Total Protein: 6.8 g/dL (ref 6.5–8.1)

## 2024-04-08 LAB — GENETIC SCREENING ORDER

## 2024-04-14 ENCOUNTER — Inpatient Hospital Stay: Payer: 59 | Admitting: Hematology and Oncology

## 2024-04-14 ENCOUNTER — Other Ambulatory Visit: Payer: 59

## 2024-04-14 ENCOUNTER — Inpatient Hospital Stay: Payer: 59 | Attending: Hematology and Oncology

## 2024-04-14 VITALS — BP 104/84 | HR 87 | Temp 98.3°F | Resp 16

## 2024-04-14 DIAGNOSIS — Z79811 Long term (current) use of aromatase inhibitors: Secondary | ICD-10-CM | POA: Diagnosis not present

## 2024-04-14 DIAGNOSIS — C50811 Malignant neoplasm of overlapping sites of right female breast: Secondary | ICD-10-CM

## 2024-04-14 DIAGNOSIS — E119 Type 2 diabetes mellitus without complications: Secondary | ICD-10-CM | POA: Diagnosis not present

## 2024-04-14 DIAGNOSIS — Z7985 Long-term (current) use of injectable non-insulin antidiabetic drugs: Secondary | ICD-10-CM | POA: Insufficient documentation

## 2024-04-14 DIAGNOSIS — Z170 Estrogen receptor positive status [ER+]: Secondary | ICD-10-CM | POA: Diagnosis not present

## 2024-04-14 DIAGNOSIS — Z9223 Personal history of estrogen therapy: Secondary | ICD-10-CM | POA: Diagnosis not present

## 2024-04-14 DIAGNOSIS — Z9221 Personal history of antineoplastic chemotherapy: Secondary | ICD-10-CM | POA: Insufficient documentation

## 2024-04-14 DIAGNOSIS — Z5111 Encounter for antineoplastic chemotherapy: Secondary | ICD-10-CM | POA: Diagnosis present

## 2024-04-14 MED ORDER — GOSERELIN ACETATE 3.6 MG ~~LOC~~ IMPL
3.6000 mg | DRUG_IMPLANT | SUBCUTANEOUS | Status: DC
  Administered 2024-04-14: 3.6 mg via SUBCUTANEOUS
  Filled 2024-04-14: qty 3.6

## 2024-04-14 NOTE — Progress Notes (Signed)
 Patient Care Team: Cameron Cea, MD as PCP - General (Hematology and Oncology) Colie Dawes, MD as Attending Physician (Radiation Oncology) Alger Infield, MD as Consulting Physician (Plastic Surgery) Cameron Cea, MD as Consulting Physician (Hematology and Oncology) Enid Harry, MD as Consulting Physician (General Surgery)  DIAGNOSIS:  Encounter Diagnosis  Name Primary?   Malignant neoplasm of overlapping sites of right breast in female, estrogen receptor positive (HCC) Yes    SUMMARY OF ONCOLOGIC HISTORY: Oncology History  Malignant neoplasm of overlapping sites of right breast in female, estrogen receptor positive (HCC)  06/16/2021 Initial Diagnosis   Palpable right breast mass: Breast MRI revealed extensive involvement of the right breast with mass and non-mass enhancement superior right breast mass measures 4.9 x 1.2 cm and together with non-mass enhancement measured 8.8 cm, enhancing mass LOQ 1.3 cm, left breast indeterminate 0.9 cm mass and a 0.8 cm mass UOQ, single abnormal lymph node Biopsy 10:00, 11:00 and 12:00: IDC with DCIS, biopsy right axillary lymph node: IDC, ER 75 to 95%, PR 90 to 95%, Ki-67 25%, HER2 negative on 1 biopsy and 2+ by IHC and FISH pending   06/21/2021 Cancer Staging   Staging form: Breast, AJCC 8th Edition - Clinical stage from 06/21/2021: Stage IIA (cT3, cN1, cM0, G2, ER+, PR+, HER2-) - Signed by Cameron Cea, MD on 06/21/2021 Histologic grading system: 3 grade system   07/01/2021 Genetic Testing   Negative genetic testing:  No pathogenic variants detected on the [employer] BRCAplus panel (report date 07/01/2021) or the [employer] CancerNext-Expanded + RNAinsight panel (report date 07/01/2021).   The BRCAplus panel offered by [employer] and includes sequencing and deletion/duplication analysis for the following 8 genes: ATM, BRCA1, BRCA2, CDH1, CHEK2, PALB2, PTEN, and TP53. The CancerNext-Expanded + RNAinsight gene panel offered by [employer] and  includes sequencing and rearrangement analysis for the following 77 genes: AIP, ALK, APC, ATM, AXIN2, BAP1, BARD1, BLM, BMPR1A, BRCA1, BRCA2, BRIP1, CDC73, CDH1, CDK4, CDKN1B, CDKN2A, CHEK2, CTNNA1, DICER1, FANCC, FH, FLCN, GALNT12, KIF1B, LZTR1, MAX, MEN1, MET, MLH1, MSH2, MSH3, MSH6, MUTYH, NBN, NF1, NF2, NTHL1, PALB2, PHOX2B, PMS2, POT1, PRKAR1A, PTCH1, PTEN, RAD51C, RAD51D, RB1, RECQL, RET, SDHA, SDHAF2, SDHB, SDHC, SDHD, SMAD4, SMARCA4, SMARCB1, SMARCE1, STK11, SUFU, TMEM127, TP53, TSC1, TSC2, VHL and XRCC2 (sequencing and deletion/duplication); EGFR, EGLN1, HOXB13, KIT, MITF, PDGFRA, POLD1 and POLE (sequencing only); EPCAM and GREM1 (deletion/duplication only). RNA data is routinely analyzed for use in variant interpretation for all genes.   07/25/2021 Surgery   Right mastectomy: Foci of invasive ductal carcinoma measuring 1.1 cm grade 2 with extensive DCIS spanning 8.9 cm, margins negative, 4/7 lymph nodes, ER 75 to 95%, PR 9095%, HER2 negative, Ki-67 25%   07/31/2021 Cancer Staging   Staging form: Breast, AJCC 8th Edition - Pathologic: Stage IB (pT1c, pN2a(sn), cM0, G2, ER+, PR+, HER2-) - Signed by Cameron Cea, MD on 09/11/2022 Method of lymph node assessment: Sentinel lymph node biopsy Histologic grading system: 3 grade system   08/25/2021 - 01/19/2022 Adjuvant Chemotherapy   Adjuvant chemotherapy: dose dense AC x 4 followed by weekly Taxol  x 12.    02/26/2022 - 04/04/2022 Radiation Therapy   Site Technique Total Dose (Gy) Dose per Fx (Gy) Completed Fx Beam Energies  Chest Wall, Right: CW_R_IMN 3D 50.4/50.4 1.8 28/28 6X  Chest Wall, Right: CW_R_PAB_SCV 3D 50.4/50.4 1.8 28/28 6X, 10X     03/14/2022 -  Anti-estrogen oral therapy   Zoladex .  Letrozole  started 07/2022.       CHIEF COMPLIANT: Follow-up on Zoladex  with  letrozole   HISTORY OF PRESENT ILLNESS:  History of Present Illness Allison Reed is a 48 year old female with a history of cancer who presents for monthly injections and  pre-surgical evaluation.  She has discontinued Femara  and Ozempic two weeks ago in preparation for surgery and will stop another medication three days prior. Recent labs show elevated CO2 within acceptable limits, blood sugar at 124 (non-fasting), and HbA1c at 6.3, indicating good glycemic control. She is concerned about blood sugar management during the medication pause. She feels apprehensive about the surgical discussions, perceiving them as more cosmetic than medically necessary. Mobility has improved significantly following dry needling treatment, allowing increased physical activity.     ALLERGIES:  is allergic to betadine  [povidone iodine ] and tape.  MEDICATIONS:  Current Outpatient Medications  Medication Sig Dispense Refill   ciprofloxacin  (CIPRO ) 500 MG tablet Take 1 tablet (500 mg total) by mouth 2 (two) times daily. 10 tablet 0   Empagliflozin-metFORMIN  HCl ER (SYNJARDY XR) 09-999 MG TB24 Take by mouth.     ergocalciferol  (VITAMIN D2) 1.25 MG (50000 UT) capsule Take 1 capsule (50,000 Units total) by mouth once a week. 12 capsule 0   letrozole  (FEMARA ) 2.5 MG tablet TAKE 1 TABLET BY MOUTH EVERY DAY 90 tablet 3   magic mouthwash w/lidocaine  SOLN Take 5 mLs by mouth 4 (four) times daily as needed for mouth pain. 240 mL 1   Multiple Vitamins-Minerals (WOMENS DAILY FORMULA PO) Take by mouth daily.     ONETOUCH VERIO test strip 1 each by Other route 2 (two) times daily.     Prasterone  (INTRAROSA ) 6.5 MG INST Place 1 insert (6.5 mg total) vaginally daily, as directed. 28 each 12   TRADJENTA 5 MG TABS tablet Take 5 mg by mouth daily.     No current facility-administered medications for this visit.   Facility-Administered Medications Ordered in Other Visits  Medication Dose Route Frequency Provider Last Rate Last Admin   goserelin (ZOLADEX ) injection 3.6 mg  3.6 mg Subcutaneous Q28 days Percival Brace, NP   3.6 mg at 04/14/24 1532    PHYSICAL EXAMINATION: ECOG PERFORMANCE  STATUS: 1 - Symptomatic but completely ambulatory    LABORATORY DATA:  I have reviewed the data as listed    Latest Ref Rng & Units 04/08/2024    8:08 AM 11/26/2023    2:57 PM 09/11/2023    5:51 PM  CMP  Glucose 70 - 99 mg/dL 604  540    BUN 6 - 20 mg/dL 9  10    Creatinine 9.81 - 1.00 mg/dL 1.91  4.78  2.95   Sodium 135 - 145 mmol/L 138  136    Potassium 3.5 - 5.1 mmol/L 3.9  4.0    Chloride 98 - 111 mmol/L 102  99    CO2 22 - 32 mmol/L 29  30    Calcium 8.9 - 10.3 mg/dL 9.5  9.8    Total Protein 6.5 - 8.1 g/dL 6.8  6.7    Total Bilirubin 0.0 - 1.2 mg/dL 0.7  0.4    Alkaline Phos 38 - 126 U/L 56  69    AST 15 - 41 U/L 20  17    ALT 0 - 44 U/L 21  20      Lab Results  Component Value Date   WBC 4.0 04/08/2024   HGB 14.3 04/08/2024   HCT 42.3 04/08/2024   MCV 77.8 (L) 04/08/2024   PLT 214 04/08/2024   NEUTROABS 1.9  04/08/2024    ASSESSMENT & PLAN:  Malignant neoplasm of overlapping sites of right breast in female, estrogen receptor positive (HCC) 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, margins negative, 4/7 lymph nodes, ER 75 to 95%, PR 9095%, HER2 negative, Ki-67 25% Genetics: Negative MammaPrint: Low risk (this is not a valid test for 4+ lymph nodes) Repeat prognostic panel on the lymph node in the final pathology also came back as ER/PR positive   Treatment plan: 1.  Adjuvant chemotherapy with dose dense Adriamycin  and Cytoxan  x4 followed by Taxol  weekly x12 started 08/25/2021-01/19/2022 2. adjuvant radiation therapy 02/27/2022-04/04/2022 3.  Followed by adjuvant antiestrogen therapy with complete estrogen blockade and abemaciclib  (abemaciclib  started 12/13/2022 discontinued due to toxicities) ------------------------------------------------------------------------------------------------------------------------ Current treatment: Zoladex  with letrozole  started June 2023 Toxicities:    Breast cancer surveillance: Plan is to do annual CT scans. CT CAP and bone scan November 2024: Benign   Return to clinic monthly for Zoladex  injections and every 6 months for follow-up with me. ------------------------------------- Assessment and Plan Assessment & Plan Malignant neoplasm of overlapping sites of right breast She is undergoing regular Xolair injections. Femara  was discontinued for upcoming surgery. Blood work showed no concerning abnormalities. Surgery recovery expected to be six to eight weeks with a hospital stay of at least one night. Advised to avoid lifting and manage activities based on fatigue. - Continue monthly Xolair injections. - Resume Femara  1-2 weeks postoperatively, considering thromboembolism risk. - Schedule follow-up in October for [facility] testing.  Type 2 diabetes mellitus HbA1c is 6.3, indicating well-controlled diabetes. Ozempic was discontinued for surgery. Potential hyperglycemia noted during period without Ozempic. - Resume Ozempic four weeks postoperatively. - Monitor blood glucose levels closely during period without Ozempic.      No orders of the defined types were placed in this encounter.  The patient has a good understanding of the overall plan. she agrees with it. she will call with any problems that may develop before the next visit here. Total time spent: 30 mins including face to face time and time spent for planning, charting and co-ordination of care   Margert Sheerer, MD 04/14/24

## 2024-04-14 NOTE — Assessment & Plan Note (Signed)
 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, margins negative, 4/7 lymph nodes, ER 75 to 95%, PR 9095%, HER2 negative, Ki-67 25% Genetics: Negative MammaPrint: Low risk (this is not a valid test for 4+ lymph nodes) Repeat prognostic panel on the lymph node in the final pathology also came back as ER/PR positive   Treatment plan: 1.  Adjuvant chemotherapy with dose dense Adriamycin and Cytoxan x4 followed by Taxol weekly x12 started 08/25/2021-01/19/2022 2. adjuvant radiation therapy 02/27/2022-04/04/2022 3.  Followed by adjuvant antiestrogen therapy with complete estrogen blockade and abemaciclib (abemaciclib started 12/13/2022 discontinued due to toxicities) ------------------------------------------------------------------------------------------------------------------------ Current treatment: Zoladex with letrozole started June 2023 Toxicities:  Breast cancer surveillance: Plan is to do annual CT scans. CT CAP and bone scan November 2024: Benign  Return to clinic monthly for Zoladex injections and every 6 months for follow-up with me.

## 2024-04-15 LAB — SIGNATERA
SIGNATERA MTM READOUT: 0 MTM/ml
SIGNATERA TEST RESULT: NEGATIVE

## 2024-04-16 ENCOUNTER — Ambulatory Visit: Attending: Family Medicine | Admitting: Physical Therapy

## 2024-04-16 DIAGNOSIS — M25611 Stiffness of right shoulder, not elsewhere classified: Secondary | ICD-10-CM | POA: Diagnosis present

## 2024-04-16 DIAGNOSIS — M7918 Myalgia, other site: Secondary | ICD-10-CM | POA: Insufficient documentation

## 2024-04-16 NOTE — Therapy (Signed)
 OUTPATIENT PHYSICAL THERAPY SHOULDER PROGRESS NOTE/DISCHARGE SUMMARY   Patient Name: Allison Reed MRN: [id number] DOB:November 29, 1976, 48 y.o., female Today's Date: 04/16/2024  END OF SESSION:  PT End of Session - 04/16/24 0802     Visit Number 12    Date for PT Re-Evaluation 04/16/24    Authorization Type [employer]    PT Start Time 0801    PT Stop Time 0840    PT Time Calculation (min) 39 min    Activity Tolerance Patient tolerated treatment well             Past Medical History:  Diagnosis Date   Diabetes mellitus without complication (HCC)    Type 2   Family history of brain tumor    Family history of skin cancer    Gestational diabetes    Headache    otc med prn   Heartburn in pregnancy    Incompetent cervix in pregnancy 04/2015   Missed abortion    no surgery required   Personal history of chemotherapy    Personal history of radiation therapy    Postpartum care following cesarean delivery (11/2) 10/12/2015   right breast ca 06/2021   UTI in pregnancy 03/2015   recent UTI, treatment completed   Past Surgical History:  Procedure Laterality Date   APPENDECTOMY  12/10/2004   BREAST BIOPSY Right 2022   BREAST BIOPSY Left 2022   BREAST RECONSTRUCTION WITH PLACEMENT OF TISSUE EXPANDER AND ALLODERM Right 07/25/2021   Procedure: RIGHT BREAST RECONSTRUCTION WITH PLACEMENT OF TISSUE EXPANDER AND ALLODERM;  Surgeon: Alger Infield, MD;  Location: [facility];  Service: Plastics;  Laterality: Right;   CERCLAGE LAPAROSCOPIC ABDOMINAL N/A 04/15/2015   Procedure: LAPAROSCOPIC TRANSABDOMINAL CERVCOISTHMIC CERCLAGE;  Surgeon: Asa Bjork, MD;  Location: [facility];  Service: Gynecology;  Laterality: N/A;   CERVICAL CERCLAGE  02/08/2011   vaginal   CESAREAN SECTION N/A 10/12/2015   Procedure: CESAREAN SECTION;  Surgeon: Percy Bracken, MD;  Location: [facility];  Service: Obstetrics;  Laterality: N/A;   CESAREAN SECTION N/A 03/03/2018   Procedure: Repeat CESAREAN  SECTION/Removal of Abdominal Cerclage;  Surgeon: Terri Fester, MD;  Location: [facility];  Service: Obstetrics;  Laterality: N/A;  EDD: 03/28/18   MASTECTOMY Right 2022   MASTECTOMY W/ SENTINEL NODE BIOPSY Right 07/25/2021   Procedure: RIGHT MASTECTOMY WITH AXILLARY SENTINEL LYMPH NODE BIOPSY;  Surgeon: Enid Harry, MD;  Location: [facility];  Service: General;  Laterality: Right;   PORTACATH PLACEMENT N/A 08/24/2021   Procedure: INSERTION PORT-A-CATH;  Surgeon: Enid Harry, MD;  Location: [facility];  Service: General;  Laterality: N/A;   RADIOACTIVE SEED GUIDED AXILLARY SENTINEL LYMPH NODE Right 07/25/2021   Procedure: RADIOACTIVE SEED GUIDED RIGHT AXILLARY NODE EXCISION;  Surgeon: Enid Harry, MD;  Location: [facility];  Service: General;  Laterality: Right;   TISSUE EXPANDER PLACEMENT Right 12/04/2021   Procedure: REMOVAL RIGHT CHEST TISSUE EXPANDER;  Surgeon: Alger Infield, MD;  Location: [facility];  Service: Plastics;  Laterality: Right;   TISSUE EXPANDER REMOVAL     WISDOM TOOTH EXTRACTION  12/11/1999   Patient Active Problem List   Diagnosis Date Noted   Weakness of right shoulder 08/21/2023   Lower leg mass, left 05/02/2022   Port-A-Cath in place 08/25/2021   S/P mastectomy, right 07/25/2021   Genetic testing 07/01/2021   Family history of skin cancer 06/22/2021   Family history of brain tumor 06/22/2021   Malignant neoplasm of overlapping sites of right  breast in female, estrogen receptor positive (HCC) 06/21/2021   Status post repeat low transverse cesarean section 03/03/2018   DM (diabetes mellitus) in pregnancy 03/02/2018   Incompetent cervix in pregnancy 03/02/2018    PCP: Cameron Cea MD  REFERRING PROVIDER: Ronnell Coins DO  REFERRING DIAG: Z61.096, G89.29 chronic left shoulder pain but pt states her problem is RIGHT shoulder pain  THERAPY DIAG:  right shoulder stiffness; fascial  restriction  Rationale for Evaluation and Treatment: Rehabilitation  ONSET DATE: > 1 year  SUBJECTIVE:                                                                                                                                                                                                         SUBJECTIVE STATEMENT: I was able to tie my dress for the dance recital. I was able to do my hair myself for 45 minutes for the recital.   Able to reach to the backseat easier to pass things to the kids.  No pain.    Reconstruction surgery May 12th.    Dance recital in early May.    EVAL:Had previous cancer rehab and PT at this facility for right shoulder pain. Had MRI in September no rotator cuff tear;  Sees osteopath who was able to relieve pain but has continued decreased right shoulder ROM;  osteopath does DN near spine and pecs but not around scapula or thorax. Tightness approx 6-7 months.  Patient interested in Graston for myofascial release.   Hand dominance: Right Uses pulleys, cat cow, open book, 3 down dog; wall stretching once a day Self Limits weight carries to 2-3 pounds, no overhead lifting PERTINENT HISTORY:  Pt is s/p Right Mastectomy with Tissue Expander and 4+/7 LN on 07/25/21. She is s/p 6 months of chemo followed by radiation. She had emergency surgery to remove tissue expander due to infection on 12/04/2021. She completed radiation on 04/04/22.  right breast reconstruction on 06/24/2023.  7 lymph node removal;  + cording + diabetes 20 years On Ozempic since Sept.   PAIN:   Are you having pain? no NPRS scale: 0/10  PRECAUTIONS: Right lymphedema risk    WEIGHT BEARING RESTRICTIONS: No  FALLS:  Has patient fallen in last 6 months? No  LIVING ENVIRONMENT: Lives with: husband and 2 children Lives in: House/apartment Stairs: Yes; Internal: 14 steps; can reach both and External: 0 steps; none  OCCUPATION: Administrative Asst, works from home   LEISURE: walking,  work out with Psychologist, educational,  PLOF: Independent  PATIENT GOALS: improve shoulder mobility; pt is a Horticulturist, commercial  OBJECTIVE:  Note: Objective measures were completed at Evaluation unless otherwise noted.  DIAGNOSTIC FINDINGS:  September 2024 IMPRESSION: 1. Mild supraspinatus tendinopathy. No rotator cuff tear. 2. Mild tendinopathy of the intra-articular segment of the long head of the biceps. 3. Mild degenerative chondral thinning in the glenohumeral joint. 4. Mild degenerative spurring in the [facility] joint. 5. Trace subacromial subdeltoid bursitis.  PATIENT SURVEYS:  FOTO 51% 3/27: 65% 5/8: 64%  COGNITION: Overall cognitive status: Within functional limits for tasks assessed  PALPATION: Moderate to severe scapular hypomobility; decreased lat, teres soft tissue mobility   CERVICAL ROM: decreased cervical rotation by 25% right/left UPPER EXTREMITY ROM:  Active ROM Right eval Left eval 1/21 1/28 2/12 3/13 3/27 5/8  Shoulder flexion 110 158 118 Pre 129; post 132 135 post 126 122 pre 128 POST 130  Shoulder extension          Shoulder abduction 108 170 124 pre134; post136  113 113 pre 110 post (feels sore) 112  Shoulder adduction          Shoulder extension          Shoulder internal rotation Right iliac crest T3 Right iliac spine Right SIJ  L5 L4 L2-3  Shoulder external rotation 38; scapular spine right  73/T3 38:scapular spine 43  49 47 50  Elbow flexion          Elbow extension          Wrist flexion          Wrist extension          Wrist ulnar deviation          Wrist radial deviation          Wrist pronation          Wrist supination           (Blank rows = not tested)  Active ROM: Supine 131 flexion  UPPER EXTREMITY MMT: grossly 4/5 in available ROM  TREATMENT DATE: 04/16/24: Review of current status and activity level Shoulder ROM FOTO Manual therapy: Graston instrument 4 seated myofascial sweeping and fanning in seated position: upper traps, levator scap, rhomboids,  anterior deltoid 45 degree angle; Sidelying: teres, lateral deltoid, triceps 45 degrees angle using G4 Supine: Graston 4  45 degree angle  to pectorals Childs pose: teres, lats G4  04/02/24: Review of current status and activity level Manual therapy: Graston instrument 4 seated myofascial sweeping and fanning in seated position: upper traps, levator scap, rhomboids, anterior deltoid 45 degree angle; Sidelying: teres, lateral deltoid, triceps 45 degrees angle using G4 Supine: Graston 4  45 degree angle  to pectorals Childs pose: teres, lats G4  03/19/24: Manual therapy: Graston instrument 4 seated myofascial sweeping and fanning in seated position: upper traps, levator scap, rhomboids, anterior deltoid 45 degree angle; Sidelying: teres, lateral deltoid, triceps 45 degrees angle using G4 Supine: Graston 4  45 degree angle  to pectorals Childs pose: teres, lats G4  03/05/24: Shoulder ROM FOTO Doorway static stretching: flexion, pectoral stretch at 90 degrees with cervical rotation, pectoral stretch at 120 degrees, external rotator stretch 30 sec holds Manual therapy: Graston instrument 4 seated myofascial sweeping and fanning in seated position: upper traps, levator scap, rhomboids, anterior deltoid 45 degree angle; Sidelying: teres, lateral deltoid, triceps 45 degrees angle using G4 Supine: Graston 4  45 degree angle  to pectorals Childs pose: teres, lats G4   PATIENT EDUCATION:  Education details: Educated patient on anatomy and physiology of current symptoms, prognosis,  plan of care as well as initial self care strategies to promote recovery Person educated: Patient Education method: Explanation Education comprehension: verbalized understanding  HOME EXERCISE PROGRAM: Discussed principles of increasing ROM: every 2 hours 3-5 min of stretching;  low load but long duration stretching; prioritize supine flexion stretch (pt currently does supine cane with 1# weight)      ASSESSMENT:  CLINICAL IMPRESSION: The patient has met the majority of rehab goals, with noted improvements in functional outcome measure score, ROM, strength and functional mobility.  A comprehensive HEP has been established and anticipate further improvements over time with regular performance of the program.  Recommend discharge from PT at this time.     OBJECTIVE IMPAIRMENTS: decreased activity tolerance, decreased ROM, increased fascial restrictions, impaired perceived functional ability, impaired UE functional use, and pain.   ACTIVITY LIMITATIONS: carrying, lifting, reach over head, and hygiene/grooming  PARTICIPATION LIMITATIONS: meal prep, cleaning, and laundry  PERSONAL FACTORS: 1-2 comorbidities: risk factors for adhesive capsulitis including age, gender, diabetes, breast cancer/surgeries are also affecting patient's functional outcome.   REHAB POTENTIAL: Good  CLINICAL DECISION MAKING: Stable/uncomplicated  EVALUATION COMPLEXITY: Low   GOALS: Goals reviewed with patient? Yes  SHORT TERM GOALS: Target date: 01/28/2024    The patient will demonstrate knowledge of basic self care strategies and exercises to promote healing  Baseline:  Goal status:met 2/12 2.  The patient will have improved shoulder elevation ROM to at least 130 degrees needed for grooming/dressing purposes as well as reaching higher shelves  Baseline:  Goal status: met 2/12  3.  Right shoulder internal rotation improved with reach behind back to L4 needed for dressing,hygiene  Baseline:  Goal status: met  4.  The patient will have strength needed to carry a 5# object/shopping bag Baseline:  Goal status: met    LONG TERM GOALS: Target date: 04/16/2024      The patient will be independent in a safe self progression of a home exercise program to promote further recovery of function  Baseline:  Goal status: met  2.  The patient will have improved shoulder elevation ROM to at least 150  degrees needed for grooming/dressing purposes as well as reaching high shelves  Baseline:  Goal status: partially met   3.  Right shoulder external rotation to 50 degrees needed for fixing her hair, dressing tasks Baseline:  Goal status: met  4.  The patient will have strength needed to lift and lower a 5# object from a high shelf  Baseline:  Goal status: met 3/27 5.  The patient will have improved FOTO score to   67%    indicating improved function with less pain  Baseline:  Goal status: partially met 65% 3/27    PLAN: PHYSICAL THERAPY DISCHARGE SUMMARY  Visits from Start of Care: 12  Current functional level related to goals / functional outcomes: See clinical impressions above   Remaining deficits: As above   Education / Equipment: HEP   Patient agrees to discharge. Patient goals were partially met. Patient is being discharged due to meeting the stated rehab goals.  Darien Eden, PT 04/16/24 11:33 AM Phone: [phone] Fax: [phone]

## 2024-05-12 ENCOUNTER — Inpatient Hospital Stay: Payer: 59 | Attending: Hematology and Oncology

## 2024-05-12 VITALS — BP 98/74 | HR 80 | Temp 97.7°F | Resp 15

## 2024-05-12 DIAGNOSIS — Z1721 Progesterone receptor positive status: Secondary | ICD-10-CM | POA: Diagnosis not present

## 2024-05-12 DIAGNOSIS — Z1732 Human epidermal growth factor receptor 2 negative status: Secondary | ICD-10-CM | POA: Diagnosis not present

## 2024-05-12 DIAGNOSIS — C50811 Malignant neoplasm of overlapping sites of right female breast: Secondary | ICD-10-CM | POA: Insufficient documentation

## 2024-05-12 DIAGNOSIS — Z5111 Encounter for antineoplastic chemotherapy: Secondary | ICD-10-CM | POA: Insufficient documentation

## 2024-05-12 DIAGNOSIS — Z170 Estrogen receptor positive status [ER+]: Secondary | ICD-10-CM | POA: Insufficient documentation

## 2024-05-12 MED ORDER — GOSERELIN ACETATE 3.6 MG ~~LOC~~ IMPL
3.6000 mg | DRUG_IMPLANT | SUBCUTANEOUS | Status: DC
  Administered 2024-05-12: 3.6 mg via SUBCUTANEOUS
  Filled 2024-05-12: qty 3.6

## 2024-05-12 NOTE — Progress Notes (Signed)
 Placed Zoladex  injection in left anterior thigh, pt stated she recent had reconstructive surgery on abdomen and breast. Spoke with charge nurse for further instruction on placement. Pt ok with receiving in the SQ part of the thigh

## 2024-06-09 ENCOUNTER — Inpatient Hospital Stay: Payer: 59 | Attending: Hematology and Oncology

## 2024-06-09 VITALS — BP 112/69 | HR 88 | Resp 16

## 2024-06-09 DIAGNOSIS — Z1721 Progesterone receptor positive status: Secondary | ICD-10-CM | POA: Insufficient documentation

## 2024-06-09 DIAGNOSIS — Z170 Estrogen receptor positive status [ER+]: Secondary | ICD-10-CM | POA: Diagnosis not present

## 2024-06-09 DIAGNOSIS — Z5111 Encounter for antineoplastic chemotherapy: Secondary | ICD-10-CM | POA: Insufficient documentation

## 2024-06-09 DIAGNOSIS — C50811 Malignant neoplasm of overlapping sites of right female breast: Secondary | ICD-10-CM | POA: Diagnosis present

## 2024-06-09 DIAGNOSIS — Z1732 Human epidermal growth factor receptor 2 negative status: Secondary | ICD-10-CM | POA: Diagnosis not present

## 2024-06-09 MED ORDER — GOSERELIN ACETATE 3.6 MG ~~LOC~~ IMPL
3.6000 mg | DRUG_IMPLANT | SUBCUTANEOUS | Status: DC
  Administered 2024-06-09: 3.6 mg via SUBCUTANEOUS
  Filled 2024-06-09: qty 3.6

## 2024-07-07 ENCOUNTER — Inpatient Hospital Stay: Payer: 59

## 2024-07-07 VITALS — BP 101/81 | HR 88 | Resp 16

## 2024-07-07 DIAGNOSIS — Z5111 Encounter for antineoplastic chemotherapy: Secondary | ICD-10-CM | POA: Diagnosis not present

## 2024-07-07 DIAGNOSIS — Z170 Estrogen receptor positive status [ER+]: Secondary | ICD-10-CM

## 2024-07-07 MED ORDER — GOSERELIN ACETATE 3.6 MG ~~LOC~~ IMPL
3.6000 mg | DRUG_IMPLANT | SUBCUTANEOUS | Status: DC
  Administered 2024-07-07: 3.6 mg via SUBCUTANEOUS
  Filled 2024-07-07: qty 3.6

## 2024-07-24 ENCOUNTER — Other Ambulatory Visit: Payer: Self-pay | Admitting: Hematology and Oncology

## 2024-07-24 DIAGNOSIS — Z170 Estrogen receptor positive status [ER+]: Secondary | ICD-10-CM

## 2024-07-24 NOTE — Progress Notes (Signed)
 ICD-10-CM   1. Preop examination  Z01.818      History of Present Illness:  Allison Reed is a 48 y.o.  female  with a history of RIGHT Autologous Breast Reconstruction 04/2024 with Dr. Merita. She now desires revision procedures as well as symmetry procedure on the LEFT breast.   NOTE: LAST LEFT MMG 10/23~will need updated MMG prior to this procedure~patient aware and will f/u this coming week.    Anesthesia considerations: no PONV/AC; no recent changes in medical problems; active, works as a Air cabin crew; BMI 22.62, h/o Type 2 DM, on [place], Ozempic on HOLD since last surgery (04/2024); last A1c=7.4 H/o hypothyroidism, TSH 1.660, T4 1.41 H/o hyperlipidemia, not on medical management for either; h/o RIGHT breast on Femara /Zoladex . LABS in EPIC dated 04/20/24  Problem list: Problem List[1]  Past Medical History: Allergies: Allergies[2]  Current Medications: Current Medications[3]  Past Medical Problems: Medical History[4]  Past Surgical History: Surgical History[5]  The patient has had anesthesia or sedation in the past.   The patient has not had problems with anesthesia.  The patient does not have a family history of anesthesia problems.    Social History: Social History   Socioeconomic History  . Marital status: Married    Spouse name: Not on file  . Number of children: Not on file  . Years of education: Not on file  . Highest education level: Not on file  Occupational History  . Not on file  Tobacco Use  . Smoking status: Never  . Smokeless tobacco: Never  Substance and Sexual Activity  . Alcohol use: Never  . Drug use: Never  . Sexual activity: Not on file  Other Topics Concern  . Not on file  Social History Narrative   ** Merged History Encounter **       Social Drivers of Health   Food Insecurity: Not on file  Transportation Needs: Not on file  Safety: Not on file  Living Situation: Not on file    Family History: Family  History[6]  Review of Systems: General ROS: negative Dermatological ROS: negative Cardiovascular ROS: negative ENT ROS: negative Gastrointestinal ROS: negative  Physical Exam: Vital Signs  BP 92/66   Pulse 93   Temp 98.2 F (36.8 C) (Temporal)   Ht 1.702 m (5' 7)   Wt 65.8 kg (145 lb)   BMI 22.71 kg/m  General: 48 y/o female WDWN NAD HEENT: NCAT Neck: normal ROM of neck Chest: Normal chest wall and respirations. Clear to auscultation posteriorly.  Cardiac: Regular rate and rhythm no murmurs; no edema  Abdomen: soft, non-tender  GU: deferred Musculoskeletal: Normal gait  Neuro: oriented x 3 Extremities: no deformities Skin: no rashes  Assessment:  48 y.o.female  with a history of RIGHT breast reconstruction ready for revisions/symmetry procedure   Plan:  To OR with Dr. MERITA.  Risks, benefits, and alternatives of procedure discussed, questions answered and consent obtained. Postoperative care reviewed, questions answered. MD aware of most recent A1C and will proceed with surgery as scheduled.   Risks of surgery: Unfavorable scarring Infection Changes in nipple or breast sensation, which may be temporary or permanent Anesthesia risks Bleeding (hematoma) Blood clots Poor wound healing Breast contour and shape irregularities Skin discoloration, permanent pigmentation changes, swelling and bruising Damage to deeper structures - such as nerves, blood vessels, muscles, and lungs - can occur and may be temporary or permanent Breast asymmetry Fluid accumulation, seroma Excessive firmness of the breast Potential loss of skin/tissue of breast where  incisions meet each other Potential, partial or total loss of nipple and areola Deep vein thrombosis, cardiac and pulmonary complications Pain, which may persist Allergies to tape, suture materials and glues, blood products, topical preparations or injectable agents Fatty tissue deep in the skin could die (fat  necrosis) Donor site complications Possibility of revisional surgery Breast reduction surgery can interfere with certain diagnostic procedures Breast and nipple piercing can cause an infection    Electronically signed by: George Loa Rich, FNP 07/24/2024 6:36 PM              [1] Patient Active Problem List Diagnosis  . Polycystic ovaries  . Hyperlipidemia  . Hypothyroidism  . Type 2 diabetes mellitus without complication (HCC)  . Malignant neoplasm of female breast (HCC)  . History of therapeutic radiation  [2] No Known Allergies [3]  Current Outpatient Medications:  .  empagliflozin-metFORMIN  (Synjardy) 12.5-1,000 mg tab, Take 1 tablet by mouth 2 (two) times a day., Disp: , Rfl:  .  goserelin (Zoladex ) 3.6 mg injection, Inject under the skin every 30 (thirty) days., Disp: , Rfl:  .  letrozole  (FEMARA ) 2.5 mg tablet, Take 2.5 mg by mouth Once Daily., Disp: , Rfl:  .  MULTIVITAMIN ORAL, Take 1 tablet by mouth daily. One-A-Day Womens, Disp: , Rfl:  .  semaglutide (OZEMPIC) 1 mg/dose (4 mg/3 mL) subcutaneous pen injector, Inject 1 mg under the skin every 7 days. Injections on Mondays (Patient not taking: Reported on 07/24/2024), Disp: , Rfl:  [4] Past Medical History: Diagnosis Date  . Diabetes mellitus type II, controlled    (CMD)   . History of therapeutic radiation 06/21/2022  . Hyperlipidemia 01/30/2010  . Hypothyroidism 10/19/2009  . Malignant neoplasm of female breast    (CMD) 06/29/2021  . Type 2 diabetes mellitus without complication    (CMD) 09/11/2010  [5] Past Surgical History: Procedure Laterality Date  . APPENDECTOMY     Procedure: APPENDECTOMY  . BREAST RECONSTRUCTION Right 04/20/2024   DIEP FLAP (DEEP INF. EPIGASTRIC PERFORATOR) UNILAT performed by Blair Dupont, MD at [facility] OR  . CERVICAL CERCLAGE     Procedure: CERVICAL CERCLAGE  . CESAREAN SECTION, UNSPECIFIED     Procedure: CESAREAN SECTION  . OTHER SURGICAL HISTORY  07/25/2021   Procedure:  OTHER SURGICAL HISTORY (right mastectomy, SLN, TE/ADM reconstruction)  . OTHER SURGICAL HISTORY  12/04/2021   Procedure: OTHER SURGICAL HISTORY (removal right chest TE)  [6] Family History Problem Relation Name Age of Onset  . Diabetes Mother

## 2024-07-27 ENCOUNTER — Ambulatory Visit
Admission: RE | Admit: 2024-07-27 | Discharge: 2024-07-27 | Disposition: A | Discharge status: Home / Self Care | Source: Ambulatory Visit | Attending: Hematology and Oncology

## 2024-07-27 DIAGNOSIS — C50811 Malignant neoplasm of overlapping sites of right female breast: Secondary | ICD-10-CM

## 2024-08-04 ENCOUNTER — Inpatient Hospital Stay: Payer: 59 | Attending: Hematology and Oncology

## 2024-08-04 VITALS — BP 106/76 | HR 82 | Temp 99.1°F | Resp 18

## 2024-08-04 DIAGNOSIS — C50811 Malignant neoplasm of overlapping sites of right female breast: Secondary | ICD-10-CM | POA: Diagnosis present

## 2024-08-04 DIAGNOSIS — Z5111 Encounter for antineoplastic chemotherapy: Secondary | ICD-10-CM | POA: Insufficient documentation

## 2024-08-04 DIAGNOSIS — Z170 Estrogen receptor positive status [ER+]: Secondary | ICD-10-CM | POA: Diagnosis not present

## 2024-08-04 MED ORDER — GOSERELIN ACETATE 3.6 MG ~~LOC~~ IMPL
3.6000 mg | DRUG_IMPLANT | SUBCUTANEOUS | Status: DC
  Administered 2024-08-04: 3.6 mg via SUBCUTANEOUS
  Filled 2024-08-04: qty 3.6

## 2024-08-19 ENCOUNTER — Other Ambulatory Visit: Payer: Self-pay | Admitting: Adult Health

## 2024-08-19 DIAGNOSIS — C50811 Malignant neoplasm of overlapping sites of right female breast: Secondary | ICD-10-CM

## 2024-09-01 ENCOUNTER — Inpatient Hospital Stay: Payer: 59 | Attending: Hematology and Oncology

## 2024-09-01 VITALS — BP 97/78 | HR 90 | Temp 98.0°F | Resp 18

## 2024-09-01 DIAGNOSIS — Z5111 Encounter for antineoplastic chemotherapy: Secondary | ICD-10-CM | POA: Insufficient documentation

## 2024-09-01 DIAGNOSIS — C50811 Malignant neoplasm of overlapping sites of right female breast: Secondary | ICD-10-CM | POA: Insufficient documentation

## 2024-09-01 DIAGNOSIS — Z170 Estrogen receptor positive status [ER+]: Secondary | ICD-10-CM | POA: Insufficient documentation

## 2024-09-01 MED ORDER — GOSERELIN ACETATE 3.6 MG ~~LOC~~ IMPL
3.6000 mg | DRUG_IMPLANT | SUBCUTANEOUS | Status: DC
  Administered 2024-09-01: 3.6 mg via SUBCUTANEOUS
  Filled 2024-09-01 (×2): qty 3.6

## 2024-09-29 ENCOUNTER — Ambulatory Visit: Payer: 59

## 2024-10-01 ENCOUNTER — Inpatient Hospital Stay: Attending: Hematology and Oncology

## 2024-10-01 VITALS — BP 95/68 | HR 88 | Temp 98.7°F | Resp 18

## 2024-10-01 DIAGNOSIS — Z79811 Long term (current) use of aromatase inhibitors: Secondary | ICD-10-CM | POA: Insufficient documentation

## 2024-10-01 DIAGNOSIS — C50811 Malignant neoplasm of overlapping sites of right female breast: Secondary | ICD-10-CM | POA: Insufficient documentation

## 2024-10-01 DIAGNOSIS — Z923 Personal history of irradiation: Secondary | ICD-10-CM | POA: Insufficient documentation

## 2024-10-01 DIAGNOSIS — Z5111 Encounter for antineoplastic chemotherapy: Secondary | ICD-10-CM | POA: Insufficient documentation

## 2024-10-01 DIAGNOSIS — Z170 Estrogen receptor positive status [ER+]: Secondary | ICD-10-CM | POA: Insufficient documentation

## 2024-10-01 DIAGNOSIS — Z9012 Acquired absence of left breast and nipple: Secondary | ICD-10-CM | POA: Insufficient documentation

## 2024-10-01 DIAGNOSIS — Z9221 Personal history of antineoplastic chemotherapy: Secondary | ICD-10-CM | POA: Insufficient documentation

## 2024-10-01 MED ORDER — GOSERELIN ACETATE 3.6 MG ~~LOC~~ IMPL
3.6000 mg | DRUG_IMPLANT | SUBCUTANEOUS | Status: DC
  Administered 2024-10-01: 3.6 mg via SUBCUTANEOUS
  Filled 2024-10-01: qty 3.6

## 2024-10-07 ENCOUNTER — Other Ambulatory Visit: Payer: Self-pay | Admitting: *Deleted

## 2024-10-07 DIAGNOSIS — C50811 Malignant neoplasm of overlapping sites of right female breast: Secondary | ICD-10-CM

## 2024-10-08 ENCOUNTER — Inpatient Hospital Stay

## 2024-10-08 DIAGNOSIS — C50811 Malignant neoplasm of overlapping sites of right female breast: Secondary | ICD-10-CM

## 2024-10-08 DIAGNOSIS — Z5111 Encounter for antineoplastic chemotherapy: Secondary | ICD-10-CM | POA: Diagnosis not present

## 2024-10-08 LAB — CBC WITH DIFFERENTIAL (CANCER CENTER ONLY)
Abs Immature Granulocytes: 0 K/uL (ref 0.00–0.07)
Basophils Absolute: 0 K/uL (ref 0.0–0.1)
Basophils Relative: 1 %
Eosinophils Absolute: 0.2 K/uL (ref 0.0–0.5)
Eosinophils Relative: 4 %
HCT: 44.7 % (ref 36.0–46.0)
Hemoglobin: 15.3 g/dL — ABNORMAL HIGH (ref 12.0–15.0)
Immature Granulocytes: 0 %
Lymphocytes Relative: 38 %
Lymphs Abs: 1.6 K/uL (ref 0.7–4.0)
MCH: 26 pg (ref 26.0–34.0)
MCHC: 34.2 g/dL (ref 30.0–36.0)
MCV: 76 fL — ABNORMAL LOW (ref 80.0–100.0)
Monocytes Absolute: 0.3 K/uL (ref 0.1–1.0)
Monocytes Relative: 6 %
Neutro Abs: 2.2 K/uL (ref 1.7–7.7)
Neutrophils Relative %: 51 %
Platelet Count: 190 K/uL (ref 150–400)
RBC: 5.88 MIL/uL — ABNORMAL HIGH (ref 3.87–5.11)
RDW: 13.9 % (ref 11.5–15.5)
WBC Count: 4.2 K/uL (ref 4.0–10.5)
nRBC: 0 % (ref 0.0–0.2)

## 2024-10-08 LAB — CMP (CANCER CENTER ONLY)
ALT: 17 U/L (ref 0–44)
AST: 17 U/L (ref 15–41)
Albumin: 4.6 g/dL (ref 3.5–5.0)
Alkaline Phosphatase: 73 U/L (ref 38–126)
Anion gap: 8 (ref 5–15)
BUN: 10 mg/dL (ref 6–20)
CO2: 29 mmol/L (ref 22–32)
Calcium: 9.5 mg/dL (ref 8.9–10.3)
Chloride: 99 mmol/L (ref 98–111)
Creatinine: 0.63 mg/dL (ref 0.44–1.00)
GFR, Estimated: >60 mL/min (ref >60)
Glucose, Bld: 190 mg/dL — ABNORMAL HIGH (ref 70–99)
Potassium: 4.1 mmol/L (ref 3.5–5.1)
Sodium: 136 mmol/L (ref 135–145)
Total Bilirubin: 0.5 mg/dL (ref 0.0–1.2)
Total Protein: 7 g/dL (ref 6.5–8.1)

## 2024-10-08 LAB — GENETIC SCREENING ORDER

## 2024-10-17 LAB — SIGNATERA
SIGNATERA MTM READOUT: 0 MTM/ml
SIGNATERA TEST RESULT: NEGATIVE

## 2024-10-27 ENCOUNTER — Inpatient Hospital Stay: Payer: 59 | Attending: Hematology and Oncology

## 2024-10-27 ENCOUNTER — Inpatient Hospital Stay: Attending: Hematology and Oncology | Admitting: Hematology and Oncology

## 2024-10-27 VITALS — BP 99/68 | HR 96 | Temp 98.2°F | Resp 18 | Ht 67.0 in | Wt 149.5 lb

## 2024-10-27 DIAGNOSIS — Z9221 Personal history of antineoplastic chemotherapy: Secondary | ICD-10-CM | POA: Diagnosis not present

## 2024-10-27 DIAGNOSIS — Z5111 Encounter for antineoplastic chemotherapy: Secondary | ICD-10-CM | POA: Diagnosis present

## 2024-10-27 DIAGNOSIS — Z780 Asymptomatic menopausal state: Secondary | ICD-10-CM | POA: Diagnosis not present

## 2024-10-27 DIAGNOSIS — Z170 Estrogen receptor positive status [ER+]: Secondary | ICD-10-CM

## 2024-10-27 DIAGNOSIS — N951 Menopausal and female climacteric states: Secondary | ICD-10-CM | POA: Diagnosis not present

## 2024-10-27 DIAGNOSIS — Z9011 Acquired absence of right breast and nipple: Secondary | ICD-10-CM | POA: Diagnosis not present

## 2024-10-27 DIAGNOSIS — Z923 Personal history of irradiation: Secondary | ICD-10-CM | POA: Diagnosis not present

## 2024-10-27 DIAGNOSIS — Z79811 Long term (current) use of aromatase inhibitors: Secondary | ICD-10-CM | POA: Insufficient documentation

## 2024-10-27 DIAGNOSIS — C50811 Malignant neoplasm of overlapping sites of right female breast: Secondary | ICD-10-CM | POA: Diagnosis present

## 2024-10-27 MED ORDER — GOSERELIN ACETATE 3.6 MG ~~LOC~~ IMPL
3.6000 mg | DRUG_IMPLANT | SUBCUTANEOUS | Status: DC
  Administered 2024-10-27: 3.6 mg via SUBCUTANEOUS
  Filled 2024-10-27: qty 3.6

## 2024-10-27 NOTE — Progress Notes (Signed)
 Patient Care Team: Odean Potts, MD as PCP - General (Hematology and Oncology) Izell Domino, MD as Attending Physician (Radiation Oncology) Arelia Filippo, MD as Consulting Physician (Plastic Surgery) Odean Potts, MD as Consulting Physician (Hematology and Oncology) Ebbie Cough, MD as Consulting Physician (General Surgery)  DIAGNOSIS:  Encounter Diagnoses  Name Primary?   Malignant neoplasm of overlapping sites of right breast in female, estrogen receptor positive (HCC) Yes   Post-menopausal     SUMMARY OF ONCOLOGIC HISTORY: Oncology History  Malignant neoplasm of overlapping sites of right breast in female, estrogen receptor positive (HCC)  06/16/2021 Initial Diagnosis   Palpable right breast mass: Breast MRI revealed extensive involvement of the right breast with mass and non-mass enhancement superior right breast mass measures 4.9 x 1.2 cm and together with non-mass enhancement measured 8.8 cm, enhancing mass LOQ 1.3 cm, left breast indeterminate 0.9 cm mass and a 0.8 cm mass UOQ, single abnormal lymph node Biopsy 10:00, 11:00 and 12:00: IDC with DCIS, biopsy right axillary lymph node: IDC, ER 75 to 95%, PR 90 to 95%, Ki-67 25%, HER2 negative on 1 biopsy and 2+ by IHC and FISH pending   06/21/2021 Cancer Staging   Staging form: Breast, AJCC 8th Edition - Clinical stage from 06/21/2021: Stage IIA (cT3, cN1, cM0, G2, ER+, PR+, HER2-) - Signed by Odean Potts, MD on 06/21/2021 Histologic grading system: 3 grade system   07/01/2021 Genetic Testing   Negative genetic testing:  No pathogenic variants detected on the [employer] BRCAplus panel (report date 07/01/2021) or the [employer] CancerNext-Expanded + RNAinsight panel (report date 07/01/2021).   The BRCAplus panel offered by [employer] and includes sequencing and deletion/duplication analysis for the following 8 genes: ATM, BRCA1, BRCA2, CDH1, CHEK2, PALB2, PTEN, and TP53. The CancerNext-Expanded + RNAinsight gene panel offered by  [employer] and includes sequencing and rearrangement analysis for the following 77 genes: AIP, ALK, APC, ATM, AXIN2, BAP1, BARD1, BLM, BMPR1A, BRCA1, BRCA2, BRIP1, CDC73, CDH1, CDK4, CDKN1B, CDKN2A, CHEK2, CTNNA1, DICER1, FANCC, FH, FLCN, GALNT12, KIF1B, LZTR1, MAX, MEN1, MET, MLH1, MSH2, MSH3, MSH6, MUTYH, NBN, NF1, NF2, NTHL1, PALB2, PHOX2B, PMS2, POT1, PRKAR1A, PTCH1, PTEN, RAD51C, RAD51D, RB1, RECQL, RET, SDHA, SDHAF2, SDHB, SDHC, SDHD, SMAD4, SMARCA4, SMARCB1, SMARCE1, STK11, SUFU, TMEM127, TP53, TSC1, TSC2, VHL and XRCC2 (sequencing and deletion/duplication); EGFR, EGLN1, HOXB13, KIT, MITF, PDGFRA, POLD1 and POLE (sequencing only); EPCAM and GREM1 (deletion/duplication only). RNA data is routinely analyzed for use in variant interpretation for all genes.   07/25/2021 Surgery   Right mastectomy: Foci of invasive ductal carcinoma measuring 1.1 cm grade 2 with extensive DCIS spanning 8.9 cm, margins negative, 4/7 lymph nodes, ER 75 to 95%, PR 9095%, HER2 negative, Ki-67 25%   07/31/2021 Cancer Staging   Staging form: Breast, AJCC 8th Edition - Pathologic: Stage IB (pT1c, pN2a(sn), cM0, G2, ER+, PR+, HER2-) - Signed by Odean Potts, MD on 09/11/2022 Method of lymph node assessment: Sentinel lymph node biopsy Histologic grading system: 3 grade system   08/25/2021 - 01/19/2022 Adjuvant Chemotherapy   Adjuvant chemotherapy: dose dense AC x 4 followed by weekly Taxol  x 12.    02/26/2022 - 04/04/2022 Radiation Therapy   Site Technique Total Dose (Gy) Dose per Fx (Gy) Completed Fx Beam Energies  Chest Wall, Right: CW_R_IMN 3D 50.4/50.4 1.8 28/28 6X  Chest Wall, Right: CW_R_PAB_SCV 3D 50.4/50.4 1.8 28/28 6X, 10X     03/14/2022 -  Anti-estrogen oral therapy   Zoladex .  Letrozole  started 07/2022.       CHIEF COMPLIANT:  Follow-up on Zoladex  with letrozole   HISTORY OF PRESENT ILLNESS:  History of Present Illness Allison Reed is a 48 year old female with breast cancer who presents for follow-up  regarding her cancer treatment and reconstruction surgery.  She completed breast reconstruction surgery in May 2025, followed by a revision surgery three months later. She experiences pain in her back from fat removal during the second surgery, which limits her ability to run. She can walk up to four and a half miles before experiencing back pain. She continues to receive injections in her buttocks due to limited abdominal space, with varying levels of pain. She experiences occasional hot flashes but does not require additional medication.  She has resumed practicing dance once a week and works with a trainer to strengthen her core and stretch her lower back. She no longer experiences fatigue and has regained most of her flexibility, though there is a slight deficit. She keeps up with [employer] testing, which has been negative, but experiences anxiety while waiting for results. She inquires about the need for PET scans and bone density tests, noting she has not had a bone density test before. A mammogram on her left breast was completed in August 2025.     ALLERGIES:  is allergic to betadine  [povidone iodine ] and tape.  MEDICATIONS:  Current Outpatient Medications  Medication Sig Dispense Refill   ciprofloxacin  (CIPRO ) 500 MG tablet Take 1 tablet (500 mg total) by mouth 2 (two) times daily. 10 tablet 0   Empagliflozin-metFORMIN  HCl ER (SYNJARDY XR) 09-999 MG TB24 Take by mouth.     ergocalciferol  (VITAMIN D2) 1.25 MG (50000 UT) capsule Take 1 capsule (50,000 Units total) by mouth once a week. 12 capsule 0   letrozole  (FEMARA ) 2.5 MG tablet TAKE 1 TABLET BY MOUTH EVERY DAY 90 tablet 3   magic mouthwash w/lidocaine  SOLN Take 5 mLs by mouth 4 (four) times daily as needed for mouth pain. 240 mL 1   Multiple Vitamins-Minerals (WOMENS DAILY FORMULA PO) Take by mouth daily.     ONETOUCH VERIO test strip 1 each by Other route 2 (two) times daily.     Prasterone  (INTRAROSA ) 6.5 MG INST Place 1 insert (6.5  mg total) vaginally daily, as directed. 28 each 12   TRADJENTA 5 MG TABS tablet Take 5 mg by mouth daily.     No current facility-administered medications for this visit.   Facility-Administered Medications Ordered in Other Visits  Medication Dose Route Frequency Provider Last Rate Last Admin   goserelin (ZOLADEX ) injection 3.6 mg  3.6 mg Subcutaneous Q28 days Causey, Morna Pickle, NP        PHYSICAL EXAMINATION: ECOG PERFORMANCE STATUS: 1 - Symptomatic but completely ambulatory  Vitals:   10/27/24 1529  BP: 99/68  Pulse: 96  Resp: 18  Temp: 98.2 F (36.8 C)  SpO2: 100%   Filed Weights   10/27/24 1529  Weight: 149 lb 8 oz (67.8 kg)     LABORATORY DATA:  I have reviewed the data as listed    Latest Ref Rng & Units 10/08/2024    8:21 AM 04/08/2024    8:08 AM 11/26/2023    2:57 PM  CMP  Glucose 70 - 99 mg/dL 809  875  819   BUN 6 - 20 mg/dL 10  9  10    Creatinine 0.44 - 1.00 mg/dL 9.36  9.39  9.39   Sodium 135 - 145 mmol/L 136  138  136   Potassium 3.5 - 5.1 mmol/L  4.1  3.9  4.0   Chloride 98 - 111 mmol/L 99  102  99   CO2 22 - 32 mmol/L 29  29  30    Calcium 8.9 - 10.3 mg/dL 9.5  9.5  9.8   Total Protein 6.5 - 8.1 g/dL 7.0  6.8  6.7   Total Bilirubin 0.0 - 1.2 mg/dL 0.5  0.7  0.4   Alkaline Phos 38 - 126 U/L 73  56  69   AST 15 - 41 U/L 17  20  17    ALT 0 - 44 U/L 17  21  20      Lab Results  Component Value Date   WBC 4.2 10/08/2024   HGB 15.3 (H) 10/08/2024   HCT 44.7 10/08/2024   MCV 76.0 (L) 10/08/2024   PLT 190 10/08/2024   NEUTROABS 2.2 10/08/2024    ASSESSMENT & PLAN:  Malignant neoplasm of overlapping sites of right breast in female, estrogen receptor positive (HCC) 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, margins negative, 4/7 lymph nodes, ER 75 to 95%, PR 9095%, HER2 negative, Ki-67 25% Genetics: Negative MammaPrint: Low risk (this is not a valid test for 4+  lymph nodes) Repeat prognostic panel on the lymph node in the final pathology also came back as ER/PR positive   Treatment plan: 1.  Adjuvant chemotherapy with dose dense Adriamycin  and Cytoxan  x4 followed by Taxol  weekly x12 started 08/25/2021-01/19/2022 2. adjuvant radiation therapy 02/27/2022-04/04/2022 3.  Followed by adjuvant antiestrogen therapy with complete estrogen blockade and abemaciclib  (abemaciclib  started 12/13/2022 discontinued due to toxicities) ------------------------------------------------------------------------------------------------------------------------ Current treatment: Zoladex  with letrozole  started June 2023 Toxicities: Injection site reaction Cognitive issues Fatigue: Patient was working with a trainer   D[facility] flap reconstruction surgery at [facility]  Breast cancer surveillance: [employer] for MRD monitoring: Negative CT CAP and bone scan November 2024: Benign   Mammogram will be ordered for August 2026. Bone density be ordered   Return to clinic monthly for Zoladex  injections and 1 year for follow-up with me.      Orders Placed This Encounter  Procedures   MM Digital Screening Unilat L    Standing Status:   Future    Expected Date:   07/28/2025    Expiration Date:   10/27/2025    Reason for Exam (SYMPTOM  OR DIAGNOSIS REQUIRED):   Annual mammograms    Is the patient pregnant?:   No    [facility]    Release to patient:   Immediate [1]   DG Bone Density    Standing Status:   Future    Expected Date:   01/27/2025    Expiration Date:   10/27/2025    Reason for Exam (SYMPTOM  OR DIAGNOSIS REQUIRED):   Post menopausal    Is patient pregnant?:   No    Preferred imaging location?:   [facility]    Release to patient:   Immediate   The patient has a good understanding of the overall plan. she agrees with it. she will call with any problems that may develop before the next visit here.  I personally spent a total  of 30 minutes in the care of the patient today including preparing to see the patient, getting/reviewing separately obtained history, performing a medically appropriate exam/evaluation, counseling and educating, placing orders, referring and communicating with other health care professionals, documenting clinical information in the EHR, independently interpreting results, communicating  results, and coordinating care.   Viinay K Jasmin Trumbull, MD 10/27/24

## 2024-10-27 NOTE — Assessment & Plan Note (Signed)
 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, margins negative, 4/7 lymph nodes, ER 75 to 95%, PR 9095%, HER2 negative, Ki-67 25% Genetics: Negative MammaPrint: Low risk (this is not a valid test for 4+ lymph nodes) Repeat prognostic panel on the lymph node in the final pathology also came back as ER/PR positive   Treatment plan: 1.  Adjuvant chemotherapy with dose dense Adriamycin  and Cytoxan  x4 followed by Taxol  weekly x12 started 08/25/2021-01/19/2022 2. adjuvant radiation therapy 02/27/2022-04/04/2022 3.  Followed by adjuvant antiestrogen therapy with complete estrogen blockade and abemaciclib  (abemaciclib  started 12/13/2022 discontinued due to toxicities) ------------------------------------------------------------------------------------------------------------------------ Current treatment: Zoladex  with letrozole  started June 2023 Toxicities:   Breast cancer surveillance: [employer] for MRD monitoring: Negative CT CAP and bone scan November 2024: Benign   Return to clinic monthly for Zoladex  injections and every 6 months for follow-up with me.

## 2024-10-28 ENCOUNTER — Telehealth: Payer: Self-pay | Admitting: Hematology and Oncology

## 2024-10-28 NOTE — Telephone Encounter (Signed)
 left for vm for pt about scheduled injection appts. Encouraged to call back if any questions or concerns

## 2024-10-30 ENCOUNTER — Telehealth: Payer: Self-pay | Admitting: Hematology and Oncology

## 2024-10-30 NOTE — Telephone Encounter (Signed)
 left vm for patient to call back and discuss her concerns about scheduled injection appts.

## 2024-11-24 ENCOUNTER — Inpatient Hospital Stay: Payer: 59 | Attending: Hematology and Oncology

## 2024-11-24 VITALS — BP 99/74 | HR 101 | Resp 16

## 2024-11-24 DIAGNOSIS — C50811 Malignant neoplasm of overlapping sites of right female breast: Secondary | ICD-10-CM

## 2024-11-24 DIAGNOSIS — Z170 Estrogen receptor positive status [ER+]: Secondary | ICD-10-CM | POA: Insufficient documentation

## 2024-11-24 DIAGNOSIS — Z5111 Encounter for antineoplastic chemotherapy: Secondary | ICD-10-CM | POA: Diagnosis present

## 2024-11-24 MED ORDER — GOSERELIN ACETATE 3.6 MG ~~LOC~~ IMPL
3.6000 mg | DRUG_IMPLANT | SUBCUTANEOUS | Status: DC
  Administered 2024-11-24: 16:00:00 3.6 mg via SUBCUTANEOUS
  Filled 2024-11-24: qty 3.6

## 2024-12-22 ENCOUNTER — Inpatient Hospital Stay: Attending: Hematology and Oncology

## 2024-12-22 DIAGNOSIS — C50811 Malignant neoplasm of overlapping sites of right female breast: Secondary | ICD-10-CM | POA: Insufficient documentation

## 2024-12-22 DIAGNOSIS — Z170 Estrogen receptor positive status [ER+]: Secondary | ICD-10-CM | POA: Insufficient documentation

## 2024-12-22 DIAGNOSIS — Z5111 Encounter for antineoplastic chemotherapy: Secondary | ICD-10-CM | POA: Insufficient documentation

## 2024-12-22 MED ORDER — GOSERELIN ACETATE 3.6 MG ~~LOC~~ IMPL
3.6000 mg | DRUG_IMPLANT | Freq: Once | SUBCUTANEOUS | Status: AC
  Administered 2024-12-22: 3.6 mg via SUBCUTANEOUS
  Filled 2024-12-22: qty 3.6

## 2024-12-25 ENCOUNTER — Inpatient Hospital Stay

## 2025-01-19 ENCOUNTER — Inpatient Hospital Stay: Attending: Hematology and Oncology

## 2025-01-19 ENCOUNTER — Inpatient Hospital Stay

## 2025-01-25 ENCOUNTER — Inpatient Hospital Stay

## 2025-01-27 ENCOUNTER — Inpatient Hospital Stay

## 2025-01-27 ENCOUNTER — Inpatient Hospital Stay: Attending: Hematology and Oncology

## 2025-02-16 ENCOUNTER — Inpatient Hospital Stay

## 2025-02-22 ENCOUNTER — Inpatient Hospital Stay

## 2025-02-23 ENCOUNTER — Inpatient Hospital Stay: Attending: Hematology and Oncology

## 2025-03-16 ENCOUNTER — Inpatient Hospital Stay

## 2025-03-23 ENCOUNTER — Inpatient Hospital Stay

## 2025-03-25 ENCOUNTER — Inpatient Hospital Stay

## 2025-03-30 ENCOUNTER — Inpatient Hospital Stay: Attending: Hematology and Oncology

## 2025-04-13 ENCOUNTER — Inpatient Hospital Stay: Attending: Hematology and Oncology

## 2025-04-20 ENCOUNTER — Inpatient Hospital Stay: Attending: Hematology and Oncology

## 2025-04-23 ENCOUNTER — Inpatient Hospital Stay

## 2025-05-11 ENCOUNTER — Inpatient Hospital Stay

## 2025-05-18 ENCOUNTER — Inpatient Hospital Stay: Attending: Hematology and Oncology

## 2025-05-25 ENCOUNTER — Inpatient Hospital Stay

## 2025-06-08 ENCOUNTER — Inpatient Hospital Stay

## 2025-06-15 ENCOUNTER — Inpatient Hospital Stay: Attending: Hematology and Oncology

## 2025-06-24 ENCOUNTER — Inpatient Hospital Stay

## 2025-07-06 ENCOUNTER — Inpatient Hospital Stay

## 2025-07-13 ENCOUNTER — Inpatient Hospital Stay: Attending: Hematology and Oncology

## 2025-07-26 ENCOUNTER — Inpatient Hospital Stay

## 2025-08-03 ENCOUNTER — Inpatient Hospital Stay: Attending: Hematology and Oncology

## 2025-08-10 ENCOUNTER — Inpatient Hospital Stay: Attending: Hematology and Oncology

## 2025-08-25 ENCOUNTER — Inpatient Hospital Stay

## 2025-08-31 ENCOUNTER — Inpatient Hospital Stay: Attending: Hematology and Oncology

## 2025-09-07 ENCOUNTER — Inpatient Hospital Stay

## 2025-09-24 ENCOUNTER — Inpatient Hospital Stay

## 2025-09-28 ENCOUNTER — Inpatient Hospital Stay

## 2025-10-05 ENCOUNTER — Inpatient Hospital Stay

## 2025-10-25 ENCOUNTER — Inpatient Hospital Stay: Admitting: Hematology and Oncology

## 2025-10-25 ENCOUNTER — Inpatient Hospital Stay

## 2025-10-26 ENCOUNTER — Inpatient Hospital Stay: Admitting: Hematology and Oncology

## 2025-10-26 ENCOUNTER — Inpatient Hospital Stay

## 2025-11-02 ENCOUNTER — Inpatient Hospital Stay: Attending: Hematology and Oncology

## 2025-11-02 ENCOUNTER — Inpatient Hospital Stay: Admitting: Hematology and Oncology
